# Patient Record
Sex: Female | Born: 1935 | Race: White | Hispanic: No | Smoking: Former smoker
Health system: Southern US, Community
[De-identification: ages and names within clinical notes are randomized; demographics above are authoritative.]

## PROBLEM LIST (undated history)

## (undated) DIAGNOSIS — C50919 Malignant neoplasm of unspecified site of unspecified female breast: Secondary | ICD-10-CM

## (undated) DIAGNOSIS — E7800 Pure hypercholesterolemia, unspecified: Secondary | ICD-10-CM

## (undated) DIAGNOSIS — E782 Mixed hyperlipidemia: Secondary | ICD-10-CM

## (undated) DIAGNOSIS — I429 Cardiomyopathy, unspecified: Secondary | ICD-10-CM

## (undated) DIAGNOSIS — F430 Acute stress reaction: Secondary | ICD-10-CM

## (undated) DIAGNOSIS — M1990 Unspecified osteoarthritis, unspecified site: Secondary | ICD-10-CM

## (undated) DIAGNOSIS — M8580 Other specified disorders of bone density and structure, unspecified site: Secondary | ICD-10-CM

## (undated) DIAGNOSIS — Z8719 Personal history of other diseases of the digestive system: Secondary | ICD-10-CM

## (undated) DIAGNOSIS — Z972 Presence of dental prosthetic device (complete) (partial): Secondary | ICD-10-CM

## (undated) DIAGNOSIS — I248 Other forms of acute ischemic heart disease: Secondary | ICD-10-CM

## (undated) DIAGNOSIS — E119 Type 2 diabetes mellitus without complications: Secondary | ICD-10-CM

## (undated) DIAGNOSIS — C55 Malignant neoplasm of uterus, part unspecified: Secondary | ICD-10-CM

## (undated) DIAGNOSIS — I2489 Other forms of acute ischemic heart disease: Secondary | ICD-10-CM

## (undated) DIAGNOSIS — Z8711 Personal history of peptic ulcer disease: Secondary | ICD-10-CM

## (undated) DIAGNOSIS — D699 Hemorrhagic condition, unspecified: Secondary | ICD-10-CM

## (undated) DIAGNOSIS — K219 Gastro-esophageal reflux disease without esophagitis: Secondary | ICD-10-CM

## (undated) DIAGNOSIS — K08109 Complete loss of teeth, unspecified cause, unspecified class: Secondary | ICD-10-CM

## (undated) DIAGNOSIS — C539 Malignant neoplasm of cervix uteri, unspecified: Secondary | ICD-10-CM

## (undated) DIAGNOSIS — I8390 Asymptomatic varicose veins of unspecified lower extremity: Secondary | ICD-10-CM

## (undated) DIAGNOSIS — I10 Essential (primary) hypertension: Secondary | ICD-10-CM

## (undated) HISTORY — DX: Unspecified osteoarthritis, unspecified site: M19.90

## (undated) HISTORY — DX: Mixed hyperlipidemia: E78.2

## (undated) HISTORY — DX: Gastro-esophageal reflux disease without esophagitis: K21.9

## (undated) HISTORY — DX: Cardiomyopathy, unspecified: I42.9

## (undated) HISTORY — DX: Pure hypercholesterolemia, unspecified: E78.00

## (undated) HISTORY — DX: Asymptomatic varicose veins of unspecified lower extremity: I83.90

## (undated) HISTORY — DX: Other forms of acute ischemic heart disease: I24.8

## (undated) HISTORY — DX: Personal history of peptic ulcer disease: Z87.11

## (undated) HISTORY — DX: Type 2 diabetes mellitus without complications: E11.9

## (undated) HISTORY — DX: Acute stress reaction: F43.0

## (undated) HISTORY — DX: Personal history of other diseases of the digestive system: Z87.19

## (undated) HISTORY — DX: Other specified disorders of bone density and structure, unspecified site: M85.80

## (undated) HISTORY — DX: Malignant neoplasm of uterus, part unspecified: C55

## (undated) HISTORY — DX: Other forms of acute ischemic heart disease: I24.89

## (undated) HISTORY — DX: Hemorrhagic condition, unspecified: D69.9

## (undated) HISTORY — DX: Essential (primary) hypertension: I10

---

## 1962-09-30 HISTORY — PX: ABDOMINAL HYSTERECTOMY: SHX81

## 1993-09-30 HISTORY — PX: BLADDER SURGERY: SHX569

## 2001-08-12 ENCOUNTER — Other Ambulatory Visit: Admission: RE | Admit: 2001-08-12 | Discharge: 2001-08-12 | Payer: Self-pay | Admitting: Family Medicine

## 2004-07-03 ENCOUNTER — Ambulatory Visit: Payer: Self-pay | Admitting: Dentistry

## 2004-07-03 ENCOUNTER — Encounter: Admission: AD | Admit: 2004-07-03 | Discharge: 2004-07-03 | Payer: Self-pay | Admitting: Dentistry

## 2004-07-20 ENCOUNTER — Ambulatory Visit: Payer: Self-pay | Admitting: Dentistry

## 2004-08-03 ENCOUNTER — Ambulatory Visit: Payer: Self-pay | Admitting: Dentistry

## 2004-08-28 ENCOUNTER — Ambulatory Visit: Payer: Self-pay | Admitting: Dentistry

## 2004-08-31 ENCOUNTER — Ambulatory Visit: Payer: Self-pay | Admitting: Internal Medicine

## 2004-09-05 ENCOUNTER — Ambulatory Visit: Payer: Self-pay | Admitting: Dentistry

## 2005-09-30 HISTORY — PX: OTHER SURGICAL HISTORY: SHX169

## 2006-02-12 ENCOUNTER — Ambulatory Visit: Payer: Self-pay | Admitting: Internal Medicine

## 2006-04-30 LAB — HM DEXA SCAN

## 2006-05-06 ENCOUNTER — Ambulatory Visit: Payer: Self-pay | Admitting: Family Medicine

## 2006-05-19 ENCOUNTER — Encounter: Payer: Self-pay | Admitting: Family Medicine

## 2006-05-19 ENCOUNTER — Ambulatory Visit: Payer: Self-pay | Admitting: Family Medicine

## 2006-05-19 ENCOUNTER — Other Ambulatory Visit: Admission: RE | Admit: 2006-05-19 | Discharge: 2006-05-19 | Payer: Self-pay | Admitting: Family Medicine

## 2006-05-19 LAB — CONVERTED CEMR LAB: Pap Smear: NORMAL

## 2006-06-05 ENCOUNTER — Ambulatory Visit: Payer: Self-pay | Admitting: Family Medicine

## 2006-06-12 ENCOUNTER — Ambulatory Visit: Payer: Self-pay | Admitting: Family Medicine

## 2006-08-07 ENCOUNTER — Ambulatory Visit: Payer: Self-pay | Admitting: Gastroenterology

## 2006-08-13 ENCOUNTER — Ambulatory Visit: Payer: Self-pay | Admitting: Family Medicine

## 2006-09-08 ENCOUNTER — Ambulatory Visit: Payer: Self-pay | Admitting: Family Medicine

## 2006-11-19 ENCOUNTER — Ambulatory Visit: Payer: Self-pay | Admitting: Family Medicine

## 2006-11-19 LAB — CONVERTED CEMR LAB
AST: 18 units/L (ref 0–37)
BUN: 13 mg/dL (ref 6–23)
Cholesterol: 192 mg/dL (ref 0–200)
Creatinine, Ser: 0.7 mg/dL (ref 0.4–1.2)
HDL: 54.8 mg/dL (ref >39.0)
Hgb A1c MFr Bld: 7 %
LDL Cholesterol: 112 mg/dL — ABNORMAL HIGH (ref 0–99)
Microalb Creat Ratio: 5.8 mg/g (ref 0.0–30.0)
Total CHOL/HDL Ratio: 3.5
Triglycerides: 124 mg/dL (ref 0–149)
VLDL: 25 mg/dL (ref 0–40)

## 2006-11-28 ENCOUNTER — Ambulatory Visit: Payer: Self-pay | Admitting: Family Medicine

## 2007-01-06 ENCOUNTER — Encounter: Payer: Self-pay | Admitting: Family Medicine

## 2007-01-06 DIAGNOSIS — K219 Gastro-esophageal reflux disease without esophagitis: Secondary | ICD-10-CM | POA: Insufficient documentation

## 2007-01-06 DIAGNOSIS — M8580 Other specified disorders of bone density and structure, unspecified site: Secondary | ICD-10-CM | POA: Insufficient documentation

## 2007-01-06 DIAGNOSIS — R32 Unspecified urinary incontinence: Secondary | ICD-10-CM | POA: Insufficient documentation

## 2007-01-06 DIAGNOSIS — I10 Essential (primary) hypertension: Secondary | ICD-10-CM | POA: Insufficient documentation

## 2007-01-06 DIAGNOSIS — M1990 Unspecified osteoarthritis, unspecified site: Secondary | ICD-10-CM | POA: Insufficient documentation

## 2007-01-06 DIAGNOSIS — E1165 Type 2 diabetes mellitus with hyperglycemia: Secondary | ICD-10-CM | POA: Insufficient documentation

## 2007-01-27 ENCOUNTER — Ambulatory Visit: Payer: Self-pay | Admitting: Family Medicine

## 2007-01-27 DIAGNOSIS — E1169 Type 2 diabetes mellitus with other specified complication: Secondary | ICD-10-CM | POA: Insufficient documentation

## 2007-01-27 DIAGNOSIS — E782 Mixed hyperlipidemia: Secondary | ICD-10-CM

## 2007-01-27 DIAGNOSIS — I8390 Asymptomatic varicose veins of unspecified lower extremity: Secondary | ICD-10-CM | POA: Insufficient documentation

## 2007-01-29 ENCOUNTER — Encounter: Payer: Self-pay | Admitting: Family Medicine

## 2007-01-29 HISTORY — PX: OTHER SURGICAL HISTORY: SHX169

## 2007-02-26 ENCOUNTER — Ambulatory Visit: Payer: Self-pay | Admitting: Family Medicine

## 2007-02-27 LAB — CONVERTED CEMR LAB
ALT: 15 units/L (ref 0–40)
AST: 15 units/L (ref 0–37)
Hgb A1c MFr Bld: 7.2 % — ABNORMAL HIGH (ref 4.6–6.0)
Total CHOL/HDL Ratio: 3.8

## 2007-03-02 ENCOUNTER — Encounter: Payer: Self-pay | Admitting: Family Medicine

## 2007-05-13 ENCOUNTER — Ambulatory Visit: Payer: Self-pay | Admitting: Family Medicine

## 2007-05-13 DIAGNOSIS — R5383 Other fatigue: Secondary | ICD-10-CM

## 2007-05-13 DIAGNOSIS — R5381 Other malaise: Secondary | ICD-10-CM | POA: Insufficient documentation

## 2007-05-13 DIAGNOSIS — F430 Acute stress reaction: Secondary | ICD-10-CM | POA: Insufficient documentation

## 2007-05-14 LAB — CONVERTED CEMR LAB
BUN: 13 mg/dL (ref 6–23)
Basophils Relative: 0.1 % (ref 0.0–1.0)
Cholesterol: 173 mg/dL (ref 0–200)
Creatinine, Ser: 0.7 mg/dL (ref 0.4–1.2)
HCT: 40.3 % (ref 36.0–46.0)
Hemoglobin: 13.8 g/dL (ref 12.0–15.0)
LDL Cholesterol: 104 mg/dL — ABNORMAL HIGH (ref 0–99)
Lymphocytes Relative: 20.6 % (ref 12.0–46.0)
MCHC: 34.1 g/dL (ref 30.0–36.0)
Monocytes Absolute: 0.5 10*3/uL (ref 0.2–0.7)
Neutrophils Relative %: 70.1 % (ref 43.0–77.0)
RDW: 12.3 % (ref 11.5–14.6)

## 2007-05-19 ENCOUNTER — Encounter: Payer: Self-pay | Admitting: Family Medicine

## 2007-05-25 ENCOUNTER — Ambulatory Visit: Payer: Self-pay | Admitting: Family Medicine

## 2007-05-26 LAB — CONVERTED CEMR LAB: Sed Rate: 8 mm/hr (ref 0–25)

## 2007-05-29 ENCOUNTER — Telehealth (INDEPENDENT_AMBULATORY_CARE_PROVIDER_SITE_OTHER): Payer: Self-pay | Admitting: *Deleted

## 2007-06-05 ENCOUNTER — Encounter: Payer: Self-pay | Admitting: Family Medicine

## 2007-06-08 ENCOUNTER — Ambulatory Visit: Payer: Self-pay | Admitting: Dentistry

## 2007-06-08 ENCOUNTER — Encounter: Payer: Self-pay | Admitting: Family Medicine

## 2007-06-08 ENCOUNTER — Ambulatory Visit: Payer: Self-pay | Admitting: Family Medicine

## 2007-06-10 ENCOUNTER — Encounter (INDEPENDENT_AMBULATORY_CARE_PROVIDER_SITE_OTHER): Payer: Self-pay | Admitting: *Deleted

## 2007-08-14 ENCOUNTER — Ambulatory Visit: Payer: Self-pay | Admitting: Family Medicine

## 2007-08-15 ENCOUNTER — Encounter: Payer: Self-pay | Admitting: Family Medicine

## 2007-08-17 LAB — CONVERTED CEMR LAB
ALT: 15 units/L (ref 0–35)
AST: 18 units/L (ref 0–37)
Albumin: 4.3 g/dL (ref 3.5–5.2)
Calcium: 10 mg/dL (ref 8.4–10.5)
Cholesterol: 171 mg/dL (ref 0–200)
HDL: 51 mg/dL (ref >39)
Hgb A1c MFr Bld: 6.9 % — ABNORMAL HIGH (ref 4.6–6.1)
Sodium: 140 meq/L (ref 135–145)
Total CHOL/HDL Ratio: 3.4
Triglycerides: 182 mg/dL — ABNORMAL HIGH (ref <150)

## 2007-09-04 ENCOUNTER — Ambulatory Visit: Payer: Self-pay | Admitting: Internal Medicine

## 2007-10-01 DIAGNOSIS — F430 Acute stress reaction: Secondary | ICD-10-CM

## 2007-10-01 HISTORY — DX: Acute stress reaction: F43.0

## 2007-10-04 ENCOUNTER — Other Ambulatory Visit: Payer: Self-pay

## 2007-10-04 ENCOUNTER — Encounter (INDEPENDENT_AMBULATORY_CARE_PROVIDER_SITE_OTHER): Payer: Self-pay | Admitting: Dermatology

## 2007-10-04 ENCOUNTER — Emergency Department: Payer: Self-pay | Admitting: Emergency Medicine

## 2007-10-04 ENCOUNTER — Encounter: Payer: Self-pay | Admitting: Family Medicine

## 2007-10-05 ENCOUNTER — Telehealth: Payer: Self-pay | Admitting: Family Medicine

## 2007-10-05 ENCOUNTER — Encounter: Payer: Self-pay | Admitting: Family Medicine

## 2007-10-09 ENCOUNTER — Ambulatory Visit: Payer: Self-pay | Admitting: Family Medicine

## 2007-10-09 DIAGNOSIS — Z9189 Other specified personal risk factors, not elsewhere classified: Secondary | ICD-10-CM | POA: Insufficient documentation

## 2007-10-16 ENCOUNTER — Ambulatory Visit: Payer: Self-pay | Admitting: Family Medicine

## 2007-10-19 ENCOUNTER — Telehealth (INDEPENDENT_AMBULATORY_CARE_PROVIDER_SITE_OTHER): Payer: Self-pay | Admitting: *Deleted

## 2007-11-02 ENCOUNTER — Ambulatory Visit: Payer: Self-pay | Admitting: Family Medicine

## 2007-11-03 LAB — CONVERTED CEMR LAB
AST: 15 units/L (ref 0–37)
Albumin: 3.8 g/dL (ref 3.5–5.2)
Calcium: 9.7 mg/dL (ref 8.4–10.5)
Chloride: 103 meq/L (ref 96–112)
GFR calc Af Amer: 107 mL/min
GFR calc non Af Amer: 88 mL/min
Sodium: 139 meq/L (ref 135–145)
Triglycerides: 101 mg/dL (ref 0–149)
VLDL: 20 mg/dL (ref 0–40)

## 2007-11-24 ENCOUNTER — Ambulatory Visit: Payer: Self-pay | Admitting: Family Medicine

## 2007-11-24 DIAGNOSIS — M545 Low back pain, unspecified: Secondary | ICD-10-CM | POA: Insufficient documentation

## 2008-01-19 ENCOUNTER — Ambulatory Visit: Payer: Self-pay | Admitting: Family Medicine

## 2008-01-20 LAB — CONVERTED CEMR LAB
ALT: 16 units/L (ref 0–35)
AST: 17 units/L (ref 0–37)
BUN: 12 mg/dL (ref 6–23)
Basophils Relative: 0.1 % (ref 0.0–1.0)
CO2: 29 meq/L (ref 19–32)
Chloride: 103 meq/L (ref 96–112)
Creatinine, Ser: 0.8 mg/dL (ref 0.4–1.2)
Eosinophils Absolute: 0.1 10*3/uL (ref 0.0–0.7)
Eosinophils Relative: 1.9 % (ref 0.0–5.0)
Glucose, Bld: 179 mg/dL — ABNORMAL HIGH (ref 70–99)
Lymphocytes Relative: 17.1 % (ref 12.0–46.0)
MCV: 89.5 fL (ref 78.0–100.0)
Microalb Creat Ratio: 63.8 mg/g — ABNORMAL HIGH (ref 0.0–30.0)
Microalb, Ur: 4 mg/dL — ABNORMAL HIGH (ref 0.0–1.9)
Monocytes Relative: 8.4 % (ref 3.0–12.0)
Neutrophils Relative %: 72.5 % (ref 43.0–77.0)
RBC: 4.8 M/uL (ref 3.87–5.11)
VLDL: 17 mg/dL (ref 0–40)
WBC: 5.9 10*3/uL (ref 4.5–10.5)

## 2008-03-01 ENCOUNTER — Ambulatory Visit: Payer: Self-pay | Admitting: Family Medicine

## 2008-03-22 ENCOUNTER — Telehealth: Payer: Self-pay | Admitting: Family Medicine

## 2008-03-24 ENCOUNTER — Encounter: Payer: Self-pay | Admitting: Family Medicine

## 2008-04-04 ENCOUNTER — Encounter: Payer: Self-pay | Admitting: Family Medicine

## 2008-04-18 ENCOUNTER — Ambulatory Visit: Payer: Self-pay | Admitting: Family Medicine

## 2008-04-18 DIAGNOSIS — J309 Allergic rhinitis, unspecified: Secondary | ICD-10-CM | POA: Insufficient documentation

## 2008-05-02 ENCOUNTER — Ambulatory Visit: Payer: Self-pay | Admitting: Family Medicine

## 2008-05-03 LAB — CONVERTED CEMR LAB
ALT: 14 units/L (ref 0–35)
AST: 16 units/L (ref 0–37)
Albumin: 3.7 g/dL (ref 3.5–5.2)
BUN: 7 mg/dL (ref 6–23)
CO2: 28 meq/L (ref 19–32)
Cholesterol: 152 mg/dL (ref 0–200)
Creatinine, Ser: 0.8 mg/dL (ref 0.4–1.2)
Glucose, Bld: 160 mg/dL — ABNORMAL HIGH (ref 70–99)
HDL: 49.5 mg/dL (ref >39.0)
Hgb A1c MFr Bld: 6.8 % — ABNORMAL HIGH (ref 4.6–6.0)
Microalb Creat Ratio: 2.9 mg/g (ref 0.0–30.0)
Microalb, Ur: 0.2 mg/dL (ref 0.0–1.9)
Total CHOL/HDL Ratio: 3.1
Triglycerides: 94 mg/dL (ref 0–149)

## 2008-06-08 ENCOUNTER — Ambulatory Visit: Payer: Self-pay | Admitting: Family Medicine

## 2008-06-20 ENCOUNTER — Ambulatory Visit: Payer: Self-pay | Admitting: Family Medicine

## 2008-06-20 ENCOUNTER — Encounter: Payer: Self-pay | Admitting: Family Medicine

## 2008-06-20 LAB — HM MAMMOGRAPHY: HM Mammogram: NORMAL

## 2008-06-29 ENCOUNTER — Ambulatory Visit: Payer: Self-pay | Admitting: Family Medicine

## 2008-09-07 ENCOUNTER — Ambulatory Visit: Payer: Self-pay | Admitting: Family Medicine

## 2008-09-07 DIAGNOSIS — M19049 Primary osteoarthritis, unspecified hand: Secondary | ICD-10-CM | POA: Insufficient documentation

## 2008-12-05 ENCOUNTER — Ambulatory Visit: Payer: Self-pay | Admitting: Family Medicine

## 2008-12-06 LAB — CONVERTED CEMR LAB
AST: 15 units/L (ref 0–37)
BUN: 11 mg/dL (ref 6–23)
Chloride: 106 meq/L (ref 96–112)
Cholesterol: 157 mg/dL (ref 0–200)
GFR calc non Af Amer: 88 mL/min
Glucose, Bld: 158 mg/dL — ABNORMAL HIGH (ref 70–99)
Hgb A1c MFr Bld: 6.9 % — ABNORMAL HIGH (ref 4.6–6.0)
Potassium: 4.1 meq/L (ref 3.5–5.1)
Sodium: 142 meq/L (ref 135–145)

## 2008-12-08 ENCOUNTER — Ambulatory Visit: Payer: Self-pay | Admitting: Family Medicine

## 2008-12-08 LAB — HM DIABETES FOOT EXAM

## 2009-01-16 ENCOUNTER — Encounter: Payer: Self-pay | Admitting: Family Medicine

## 2010-12-14 ENCOUNTER — Telehealth: Payer: Self-pay | Admitting: Family Medicine

## 2010-12-18 NOTE — Progress Notes (Signed)
  Phone Note Call from Patient Call back at Home Phone [phone]   Caller: Daughter Call For: Laura Part MD Summary of Call: Patient moved out of state a couple of years ago, but has now moved back and is asking if you would be wiling to take her back as a patient. Please advise.  Initial call taken by: Melody Comas,  December 14, 2010 9:39 AM  Follow-up for Phone Call        that is ok  Follow-up by: Laura Part MD,  December 14, 2010 1:13 PM

## 2010-12-26 ENCOUNTER — Encounter: Payer: Self-pay | Admitting: Family Medicine

## 2010-12-26 ENCOUNTER — Ambulatory Visit (INDEPENDENT_AMBULATORY_CARE_PROVIDER_SITE_OTHER): Payer: Medicare Other | Admitting: Family Medicine

## 2010-12-26 VITALS — BP 152/90 | HR 80 | Temp 98.4°F | Ht 63.0 in | Wt 157.1 lb

## 2010-12-26 DIAGNOSIS — J019 Acute sinusitis, unspecified: Secondary | ICD-10-CM

## 2010-12-26 MED ORDER — FLUTICASONE PROPIONATE 50 MCG/ACT NA SUSP: 2.0000 | Freq: Every day | NASAL | Status: DC

## 2010-12-26 MED ORDER — AMOXICILLIN 875 MG PO TABS: 875.0000 mg | ORAL_TABLET | Freq: Two times a day (BID) | ORAL | Status: AC

## 2010-12-26 NOTE — Assessment & Plan Note (Signed)
Start amoxil and use flonase.  She can get delsym for cough.  Fu prn.  Pt to fu with Dr. Milinda Antis re:DM2.

## 2010-12-26 NOTE — Progress Notes (Signed)
duration of symptoms: Since Saturday rhinorrhea:yes congestion:yes ear pain:no but both ears do itch sore throat: mild, initially, less now per patient Cough: worse at night, no sputum.  Myalgias: no  Fevers: none known other concerns:sinus pressure Sugar was prev controlled until recent illness.    ROS: See HPI.  Otherwise negative.    Meds, vitals, and allergies reviewed.   GEN: nad, alert and oriented HEENT: mucous membranes moist, TM w/o erythema, nasal epithelium injected, OP with cobblestoning, frontal sinuses ttp  NECK: supple w/o LA CV: rrr. PULM: ctab, no inc wob EXT: no edema

## 2010-12-26 NOTE — Patient Instructions (Signed)
Schedule a follow up lab appointment in:  3months. Schedule a follow up  appointment in a few days after that with Dr. Milinda Antis.  visit.  Use the flonase and start the amoxil.  You can use delsym for cough.  Your should gradually improve.  Let us know if you have other concerns.  Take care.

## 2010-12-27 ENCOUNTER — Telehealth: Payer: Self-pay | Admitting: Family Medicine

## 2010-12-27 DIAGNOSIS — E119 Type 2 diabetes mellitus without complications: Secondary | ICD-10-CM

## 2010-12-27 DIAGNOSIS — I10 Essential (primary) hypertension: Secondary | ICD-10-CM

## 2010-12-27 NOTE — Telephone Encounter (Signed)
Message copied by Roxy Manns on Thu Dec 27, 2010  8:29 AM ------      Message from: Raechel Ache      Created: Wed Dec 26, 2010  4:41 PM       Pt is back in town.  I asked her to fu with you re:DM2.  Please order the A1c for 3 months from now so it'll come back to you.  She recently had a CPE in [place] and will bring in her papers.

## 2011-01-07 ENCOUNTER — Other Ambulatory Visit: Payer: Self-pay | Admitting: *Deleted

## 2011-01-07 MED ORDER — METFORMIN HCL 500 MG PO TABS: ORAL_TABLET | ORAL | Status: DC

## 2011-01-07 MED ORDER — GLUCOSE BLOOD VI STRP: ORAL_STRIP | Status: DC

## 2011-02-04 ENCOUNTER — Other Ambulatory Visit: Payer: Self-pay | Admitting: Family Medicine

## 2011-02-05 NOTE — Telephone Encounter (Signed)
Rx called in as directed.   

## 2011-02-05 NOTE — Telephone Encounter (Signed)
Px written for call in   

## 2011-03-19 ENCOUNTER — Other Ambulatory Visit (INDEPENDENT_AMBULATORY_CARE_PROVIDER_SITE_OTHER): Payer: Medicare Other | Admitting: Family Medicine

## 2011-03-19 DIAGNOSIS — I10 Essential (primary) hypertension: Secondary | ICD-10-CM

## 2011-03-19 DIAGNOSIS — E119 Type 2 diabetes mellitus without complications: Secondary | ICD-10-CM

## 2011-03-19 LAB — RENAL FUNCTION PANEL
Albumin: 4 g/dL (ref 3.5–5.2)
BUN: 13 mg/dL (ref 6–23)
CO2: 30 mEq/L (ref 19–32)
Calcium: 9.8 mg/dL (ref 8.4–10.5)
Chloride: 106 mEq/L (ref 96–112)
Phosphorus: 4 mg/dL (ref 2.3–4.6)

## 2011-03-21 ENCOUNTER — Encounter: Payer: Self-pay | Admitting: Family Medicine

## 2011-03-22 ENCOUNTER — Ambulatory Visit: Payer: Medicare Other | Admitting: Family Medicine

## 2011-04-05 ENCOUNTER — Encounter: Payer: Self-pay | Admitting: Family Medicine

## 2011-04-05 ENCOUNTER — Ambulatory Visit (INDEPENDENT_AMBULATORY_CARE_PROVIDER_SITE_OTHER): Payer: Medicare Other | Admitting: Family Medicine

## 2011-04-05 DIAGNOSIS — E119 Type 2 diabetes mellitus without complications: Secondary | ICD-10-CM

## 2011-04-05 DIAGNOSIS — I10 Essential (primary) hypertension: Secondary | ICD-10-CM

## 2011-04-05 DIAGNOSIS — E782 Mixed hyperlipidemia: Secondary | ICD-10-CM

## 2011-04-05 DIAGNOSIS — M899 Disorder of bone, unspecified: Secondary | ICD-10-CM

## 2011-04-05 DIAGNOSIS — K219 Gastro-esophageal reflux disease without esophagitis: Secondary | ICD-10-CM

## 2011-04-05 MED ORDER — METFORMIN HCL 500 MG PO TABS: ORAL_TABLET | ORAL | Status: DC

## 2011-04-05 MED ORDER — OMEPRAZOLE 20 MG PO CPDR: 20.0000 mg | DELAYED_RELEASE_CAPSULE | Freq: Every day | ORAL | Status: DC

## 2011-04-05 MED ORDER — LISINOPRIL 40 MG PO TABS: 40.0000 mg | ORAL_TABLET | Freq: Every day | ORAL | Status: DC

## 2011-04-05 MED ORDER — GLYBURIDE 5 MG PO TABS: 10.0000 mg | ORAL_TABLET | Freq: Two times a day (BID) | ORAL | Status: DC

## 2011-04-05 MED ORDER — SIMVASTATIN 20 MG PO TABS: 20.0000 mg | ORAL_TABLET | Freq: Every day | ORAL | Status: DC

## 2011-04-05 NOTE — Assessment & Plan Note (Signed)
Due for check on zocor (low dose) and diet  Will check at 3 mo lab before next visit Disc low sat fat diet in detail and all meds refilled

## 2011-04-05 NOTE — Assessment & Plan Note (Signed)
This is worse with generally worse diet and exercise Plans to get back to better habits now Hesitant to inc metformin due to diarrhea  Will inc glipizide to 10 bid and watch sugars twice daily a1c and f/u in 3 mo  Will also work on wt loss and get eye exam up to date

## 2011-04-05 NOTE — Assessment & Plan Note (Signed)
Refilled omeprazole which works well for her

## 2011-04-05 NOTE — Patient Instructions (Signed)
You need a Tdap vaccine (that has tetnus and whooping cough vaccine in it)-- get that at the health dept -- it would be cheaper  Get back to a diabetic diet - really work on that  Exercise 5 days per week for 30 minutes (in or outdoors)  Check sugar twice daily am and pm  Increase you glipizide to 2 pills in am and 2 pills in pm  If any problems or low sugars let me know  Schedule fasting labs and then follow up in 3 months
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
## 2011-04-05 NOTE — Assessment & Plan Note (Signed)
Per pt intol of boniva and fosamax  On ca and D- disc imp of that  Will rev dexa testing at next f/u

## 2011-04-05 NOTE — Assessment & Plan Note (Signed)
bp is slt high - but pt is nervous today- returning from a long absence Will re check at next visit after better health habits and if still elevated consider med management Disc low sodium diet and exercise/ wt loss

## 2011-05-25 ENCOUNTER — Encounter: Payer: Self-pay | Admitting: Family Medicine

## 2011-05-25 ENCOUNTER — Ambulatory Visit (INDEPENDENT_AMBULATORY_CARE_PROVIDER_SITE_OTHER): Payer: Medicare Other | Admitting: Family Medicine

## 2011-05-25 VITALS — BP 142/80 | Temp 98.1°F | Wt 154.0 lb

## 2011-05-25 DIAGNOSIS — R10A0 Flank pain, unspecified side: Secondary | ICD-10-CM

## 2011-05-25 DIAGNOSIS — R350 Frequency of micturition: Secondary | ICD-10-CM

## 2011-05-25 DIAGNOSIS — R109 Unspecified abdominal pain: Secondary | ICD-10-CM

## 2011-05-25 LAB — POCT URINALYSIS DIPSTICK
Bilirubin, UA: NEGATIVE
Blood, UA: NEGATIVE
Nitrite, UA: NEGATIVE
Spec Grav, UA: 1.01
pH, UA: 5

## 2011-05-25 MED ORDER — CIPROFLOXACIN HCL 250 MG PO TABS: 250.0000 mg | ORAL_TABLET | Freq: Two times a day (BID) | ORAL | Status: AC

## 2011-05-25 NOTE — Progress Notes (Signed)
  Subjective:    Patient ID: Laura Bell, female    DOB: May 30, 1936, 75 y.o.   MRN: [id number]  HPI  Laura Bell, a 75 y.o. female presents today in the office for the following:    Hurting across back. Has had some urgeny. Sometimes a lot, sometimes only a little. Had a little URI symptoms, but that is better ow. Had some abd pain last now. Has diarrhea off and on. Also pain down side of hip.   The patient denies any dysuria, but she does have urgency. She also describes some right-sided flank pain. No gross hematuria. The patient is eating and drinking normally. She does have some mild degree of abdominal pain in the suprapubic region, and also some mild abdominal pain in the RIGHT upper quadrant. She has been afebrile throughout.  She does not have a history of repetitive urinary tract infections. Primary complaints are the urgency, as well as flank pain.  The PMH, PSH, Social History, Family History, Medications, and allergies have been reviewed in [facility], and have been updated if relevant.  Review of Systems ROS: GEN: Acute illness details above GI: Tolerating PO intake GU: maintaining adequate hydration and urination Pulm: No SOB Interactive and getting along well at home.  Otherwise, ROS is as per the HPI.     Objective:   Physical Exam   Physical Exam  Blood pressure 142/80, temperature 98.1 F (36.7 C), temperature source Oral, weight 154 lb 0.6 oz (69.872 kg).  GEN: WDWN, NAD, Non-toxic, A & O x 3 HEENT: Atraumatic, Normocephalic. Neck supple. No masses, No LAD. Ears and Nose: No external deformity. CV: RRR, No M/G/R. No JVD. No thrill. No extra heart sounds. PULM: CTA B, no wheezes, crackles, rhonchi. No retractions. No resp. distress. No accessory muscle use. ABD: S, nondistended. Mild suprapubic tenderness. Mild RIGHT upper quadrant tenderness. Negative Murphy sign. Positive CVA tenderness on the RIGHT. No rebound tenderness. No hepatosplenomegaly.Positive  normal bowel sounds. EXTR: No c/c/e NEURO Normal gait.  PSYCH: Normally interactive. Conversant. Not depressed or anxious appearing.  Calm demeanor.        Assessment & Plan:   1. Frequent urination  POCT Urine Dip, Urine culture, ciprofloxacin (CIPRO) 250 MG tablet  2. Flank pain  Urine culture, ciprofloxacin (CIPRO) 250 MG tablet   History and exam seem most consistent with an early pyelonephritis. The patient's urinalysis was relatively clean. We're going to obtain a culture, and presumptively treat her.  By examination, she does not have an acute abdomen. We discussed that the etiology is not 100% clear, but I feel comfortable managing this clinically and she has good followup in case she does not do well over the next few days.   I have asked her to follow up with Dr. Milinda Antis on Tuesday or Wednesday if she is not improving, or I would be happy to see the patient myself if she has no availability.

## 2011-05-29 ENCOUNTER — Ambulatory Visit (INDEPENDENT_AMBULATORY_CARE_PROVIDER_SITE_OTHER): Payer: Medicare Other | Admitting: Family Medicine

## 2011-05-29 ENCOUNTER — Encounter: Payer: Self-pay | Admitting: Family Medicine

## 2011-05-29 VITALS — BP 132/80 | HR 118 | Temp 97.8°F | Ht 63.0 in | Wt 154.2 lb

## 2011-05-29 DIAGNOSIS — M549 Dorsalgia, unspecified: Secondary | ICD-10-CM

## 2011-05-29 DIAGNOSIS — R1011 Right upper quadrant pain: Secondary | ICD-10-CM

## 2011-05-29 LAB — POCT URINALYSIS DIPSTICK
Ketones, UA: NEGATIVE
Leukocytes, UA: NEGATIVE
Nitrite, UA: NEGATIVE
Protein, UA: NEGATIVE
Urobilinogen, UA: 0.2
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
## 2011-05-29 NOTE — Patient Instructions (Signed)
We will refer you for abdominal ultrasound at check out  I wonder if you may have gallstones  If symptoms worsen or you have fever or vomiting - call or go to ER Avoid fatty foods

## 2011-05-30 NOTE — Assessment & Plan Note (Addendum)
See overview -- sent for abd [place] ua is negative today- will finish course of abx for uti

## 2011-05-31 ENCOUNTER — Encounter: Payer: Self-pay | Admitting: Family Medicine

## 2011-05-31 ENCOUNTER — Telehealth: Payer: Self-pay | Admitting: *Deleted

## 2011-05-31 ENCOUNTER — Ambulatory Visit: Payer: Self-pay | Admitting: Family Medicine

## 2011-05-31 ENCOUNTER — Ambulatory Visit (INDEPENDENT_AMBULATORY_CARE_PROVIDER_SITE_OTHER): Payer: Medicare Other | Admitting: Family Medicine

## 2011-05-31 DIAGNOSIS — E782 Mixed hyperlipidemia: Secondary | ICD-10-CM

## 2011-05-31 DIAGNOSIS — M899 Disorder of bone, unspecified: Secondary | ICD-10-CM

## 2011-05-31 DIAGNOSIS — R1011 Right upper quadrant pain: Secondary | ICD-10-CM

## 2011-05-31 DIAGNOSIS — M949 Disorder of cartilage, unspecified: Secondary | ICD-10-CM

## 2011-05-31 DIAGNOSIS — I10 Essential (primary) hypertension: Secondary | ICD-10-CM

## 2011-05-31 DIAGNOSIS — E119 Type 2 diabetes mellitus without complications: Secondary | ICD-10-CM

## 2011-05-31 LAB — COMPREHENSIVE METABOLIC PANEL
ALT: 18 U/L (ref 0–35)
Albumin: 4 g/dL (ref 3.5–5.2)
Alkaline Phosphatase: 53 U/L (ref 39–117)
CO2: 28 mEq/L (ref 19–32)
GFR: 110.47 mL/min (ref >60.00)
Glucose, Bld: 221 mg/dL — ABNORMAL HIGH (ref 70–99)
Potassium: 4.3 mEq/L (ref 3.5–5.1)
Sodium: 138 mEq/L (ref 135–145)
Total Protein: 6.6 g/dL (ref 6.0–8.3)

## 2011-05-31 LAB — CBC WITH DIFFERENTIAL/PLATELET
Basophils Absolute: 0 10*3/uL (ref 0.0–0.1)
Eosinophils Relative: 1.5 % (ref 0.0–5.0)
Monocytes Relative: 8 % (ref 3.0–12.0)
Neutrophils Relative %: 70.2 % (ref 43.0–77.0)
Platelets: 221 10*3/uL (ref 150.0–400.0)
RDW: 13.4 % (ref 11.5–14.6)
WBC: 5.6 10*3/uL (ref 4.5–10.5)

## 2011-05-31 LAB — LIPASE: Lipase: 29 U/L (ref 11.0–59.0)

## 2011-05-31 LAB — LIPID PANEL
Cholesterol: 168 mg/dL (ref 0–200)
LDL Cholesterol: 96 mg/dL (ref 0–99)
VLDL: 18 mg/dL (ref 0.0–40.0)

## 2011-05-31 LAB — HEMOGLOBIN A1C: Hgb A1c MFr Bld: 7.7 % — ABNORMAL HIGH (ref 4.6–6.5)

## 2011-05-31 NOTE — Assessment & Plan Note (Addendum)
slt improved  Waiting on [place] Lab today also  ucx is neg- rev this with her-inst to stop abx

## 2011-05-31 NOTE — Telephone Encounter (Signed)
Patient called regarding the results of her ultrasound.  Advised patient that we do have the results back but I really couldn't give her much detail because Dr. Milinda Antis has not reviewed the results.  I did tell her that it appeared that everything was ok.  Please advise.

## 2011-05-31 NOTE — Patient Instructions (Signed)
Labs today  Go ahead and cancel October appointments at check out  We will do eye doctor referral at check out  Follow up in 3 months I will alert your when ultrasound report comes to me

## 2011-05-31 NOTE — Assessment & Plan Note (Signed)
Improved with bp of 130/80s at home  Small white coat component Rev need for exercise

## 2011-05-31 NOTE — Assessment & Plan Note (Signed)
Per pt much improved with glyburide Rev sugars Ref to opthy  Lab today- expect a1c to start imp  Rev need for exercise when feeling better
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
## 2011-05-31 NOTE — Telephone Encounter (Signed)
Abdominal ultrasound does not show gallstones -- good news but still don't know what is causing her pain  I would consider ref to GI for further eval  Let me know if agreeable  (after I also rev her labs)

## 2011-05-31 NOTE — Telephone Encounter (Signed)
Patient notified as instructed by telephone. Pt is agreeable to go for GI referral and will wait to hear from pt care coordinator about appt.

## 2011-06-01 LAB — VITAMIN D 25 HYDROXY (VIT D DEFICIENCY, FRACTURES): Vit D, 25-Hydroxy: 32 ng/mL (ref 30–89)

## 2011-06-02 NOTE — Assessment & Plan Note (Signed)
No meds Check vit D today

## 2011-06-02 NOTE — Telephone Encounter (Signed)
Will do ref to GI

## 2011-06-05 ENCOUNTER — Other Ambulatory Visit: Payer: Self-pay | Admitting: Family Medicine

## 2011-06-12 ENCOUNTER — Telehealth: Payer: Self-pay | Admitting: Family Medicine

## 2011-06-12 NOTE — Telephone Encounter (Signed)
Message copied by Patience Musca on Wed Jun 12, 2011  5:35 PM ------      Message from: Roxy Manns A      Created: Thu Jun 06, 2011  8:31 AM       Please start 2000 iu vit D 3 otc daily

## 2011-06-12 NOTE — Telephone Encounter (Signed)
Returned your call.    Please call back .

## 2011-06-12 NOTE — Telephone Encounter (Signed)
Patient notified as instructed by telephone. 

## 2011-07-03 ENCOUNTER — Other Ambulatory Visit: Payer: Medicare Other

## 2011-07-08 ENCOUNTER — Ambulatory Visit: Payer: Medicare Other | Admitting: Family Medicine

## 2011-09-10 ENCOUNTER — Other Ambulatory Visit: Payer: Medicare Other

## 2011-09-10 ENCOUNTER — Other Ambulatory Visit (INDEPENDENT_AMBULATORY_CARE_PROVIDER_SITE_OTHER): Payer: Medicare Other

## 2011-09-10 DIAGNOSIS — E119 Type 2 diabetes mellitus without complications: Secondary | ICD-10-CM

## 2011-09-17 ENCOUNTER — Ambulatory Visit (INDEPENDENT_AMBULATORY_CARE_PROVIDER_SITE_OTHER): Payer: Medicare Other | Admitting: Family Medicine

## 2011-09-17 ENCOUNTER — Encounter: Payer: Self-pay | Admitting: Family Medicine

## 2011-09-17 VITALS — BP 138/80 | HR 96 | Temp 98.2°F | Ht 63.0 in | Wt 153.5 lb

## 2011-09-17 DIAGNOSIS — J40 Bronchitis, not specified as acute or chronic: Secondary | ICD-10-CM | POA: Insufficient documentation

## 2011-09-17 MED ORDER — AZITHROMYCIN 250 MG PO TABS: ORAL_TABLET | ORAL | Status: AC

## 2011-09-17 NOTE — Patient Instructions (Signed)
Take the zpak as directed for bronchitis Rest/ fluids/ saline nasal spray is good for congestion Continue flonase -- every other day if your nose bleeds Delsym for cough Update me if you get worse or if fever returns  Follow up with me about a month -- after cataract surgery

## 2011-09-17 NOTE — Assessment & Plan Note (Signed)
Following viral uri - with worsening prod cough in pt with DM  Will cover with zpak Also continue delsym Disc symptomatic care - see instructions on AVS  Adv to update if worse or sob
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
## 2011-10-17 DIAGNOSIS — H2510 Age-related nuclear cataract, unspecified eye: Secondary | ICD-10-CM | POA: Diagnosis not present

## 2011-10-28 ENCOUNTER — Other Ambulatory Visit: Payer: Self-pay | Admitting: Family Medicine

## 2011-10-28 ENCOUNTER — Ambulatory Visit: Payer: Medicare Other | Admitting: Family Medicine

## 2011-10-29 ENCOUNTER — Ambulatory Visit (INDEPENDENT_AMBULATORY_CARE_PROVIDER_SITE_OTHER): Payer: Medicare Other | Admitting: Family Medicine

## 2011-10-29 ENCOUNTER — Encounter: Payer: Self-pay | Admitting: Family Medicine

## 2011-10-29 VITALS — BP 160/84 | HR 88 | Temp 97.4°F | Ht 63.0 in | Wt 161.5 lb

## 2011-10-29 DIAGNOSIS — I10 Essential (primary) hypertension: Secondary | ICD-10-CM

## 2011-10-29 DIAGNOSIS — E663 Overweight: Secondary | ICD-10-CM

## 2011-10-29 DIAGNOSIS — E119 Type 2 diabetes mellitus without complications: Secondary | ICD-10-CM

## 2011-10-29 MED ORDER — METFORMIN HCL 1000 MG PO TABS: 1000.0000 mg | ORAL_TABLET | Freq: Two times a day (BID) | ORAL | Status: DC

## 2011-10-29 NOTE — Assessment & Plan Note (Signed)
Sugar up again with poor diet/ emotional eating (disc breaking this habit) Will inc metformin to 1000 bid if tolerated  Given info on DM meal planning On ace  Good foot exam  utd opthy Lab and f/u 94mo

## 2011-10-29 NOTE — Patient Instructions (Addendum)
Increase your metformin to 1000 mg twice a day (2 of the 500s or 1 of the 1000 twice daily) Follow diabetic diet  Check sugar am and pm please - keep a log  Check blood pressure once weekly  Lab and follow up in 3 months please
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
## 2011-10-29 NOTE — Assessment & Plan Note (Signed)
whitecoat syndrome with continued bp at home 130s/80s Will continue to watch cmet next visit  Will start exercising more  Disc goal of wt loss also

## 2011-10-31 ENCOUNTER — Telehealth: Payer: Self-pay | Admitting: Family Medicine

## 2011-10-31 DIAGNOSIS — E663 Overweight: Secondary | ICD-10-CM | POA: Insufficient documentation

## 2011-10-31 NOTE — Telephone Encounter (Signed)
Pt says De. Bedsole, increased the Metformin 1000mg , she wants to know if she should continue the Glyburide also. Told pt I would have someone call her back on tomorrow.

## 2011-10-31 NOTE — Assessment & Plan Note (Signed)
Disc imp of stopping emotional eating - which is sabatoging her health in many ways Rev wt goals Rev diet with healthy choices/ smaller portions and exercise 5 d per week for wt loss

## 2011-11-01 NOTE — Telephone Encounter (Signed)
Dr Milinda Antis saw pt, not me.  Will send to her.

## 2011-11-01 NOTE — Telephone Encounter (Signed)
Yes-continue the glyburide as well

## 2011-11-01 NOTE — Telephone Encounter (Signed)
Patient advised.

## 2011-12-03 ENCOUNTER — Ambulatory Visit (INDEPENDENT_AMBULATORY_CARE_PROVIDER_SITE_OTHER)
Admission: RE | Admit: 2011-12-03 | Discharge: 2011-12-03 | Disposition: A | Discharge status: Home / Self Care | Payer: Medicare Other | Source: Ambulatory Visit | Attending: Family Medicine | Admitting: Family Medicine

## 2011-12-03 ENCOUNTER — Ambulatory Visit (INDEPENDENT_AMBULATORY_CARE_PROVIDER_SITE_OTHER): Payer: Medicare Other | Admitting: Family Medicine

## 2011-12-03 ENCOUNTER — Encounter: Payer: Self-pay | Admitting: Family Medicine

## 2011-12-03 VITALS — BP 136/80 | HR 93 | Temp 97.4°F | Wt 157.5 lb

## 2011-12-03 DIAGNOSIS — M542 Cervicalgia: Secondary | ICD-10-CM | POA: Diagnosis not present

## 2011-12-03 DIAGNOSIS — M79609 Pain in unspecified limb: Secondary | ICD-10-CM | POA: Diagnosis not present

## 2011-12-03 DIAGNOSIS — M5030 Other cervical disc degeneration, unspecified cervical region: Secondary | ICD-10-CM | POA: Diagnosis not present
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
## 2011-12-03 NOTE — Assessment & Plan Note (Signed)
Per pt ? Recurrent - this episode with L arm pain and mild weakness of grip (that is improved now )  Spasm noted L peracervial muscles on exam  Nl neuro exam  Pain to rotate neck  CS x ray today and update Will continue heat/ice/ tylenol which have helped

## 2011-12-03 NOTE — Patient Instructions (Signed)
Xray of cervical spine today - we will update you  Continue ice and heat on neck and gentle stretching  Use a foam pillow that contours to your neck  Continue tylenol Update me if arm symptoms worsen  We will make plan when x ray report returns

## 2011-12-31 ENCOUNTER — Other Ambulatory Visit: Payer: Self-pay

## 2011-12-31 NOTE — Telephone Encounter (Signed)
pts daughter called and BS test strips would not go thru and sent back refill too soon. Spoke with Maralyn Sago at [facility] and she tried to refill again and was able to override. Maralyn Sago said some insurance and this was Medicare part B will only approve #100 in 90 day period no matter what directions from physician say. Maralyn Sago said diabetic strips will be in to morrow and hopefully next refill will go smoother. Pt notified diabetic strips will be read in AM.

## 2012-01-24 ENCOUNTER — Ambulatory Visit (INDEPENDENT_AMBULATORY_CARE_PROVIDER_SITE_OTHER): Payer: Medicare Other | Admitting: Family Medicine

## 2012-01-24 ENCOUNTER — Encounter: Payer: Self-pay | Admitting: Family Medicine

## 2012-01-24 VITALS — BP 130/80 | HR 98 | Temp 97.9°F | Ht 63.0 in | Wt 154.8 lb

## 2012-01-24 DIAGNOSIS — I10 Essential (primary) hypertension: Secondary | ICD-10-CM | POA: Diagnosis not present

## 2012-01-24 DIAGNOSIS — IMO0001 Reserved for inherently not codable concepts without codable children: Secondary | ICD-10-CM | POA: Diagnosis not present

## 2012-01-24 DIAGNOSIS — E119 Type 2 diabetes mellitus without complications: Secondary | ICD-10-CM

## 2012-01-24 DIAGNOSIS — E782 Mixed hyperlipidemia: Secondary | ICD-10-CM

## 2012-01-24 LAB — COMPREHENSIVE METABOLIC PANEL
ALT: 19 U/L (ref 0–35)
CO2: 27 mEq/L (ref 19–32)
Chloride: 103 mEq/L (ref 96–112)
GFR: 88.45 mL/min (ref >60.00)
Sodium: 138 mEq/L (ref 135–145)
Total Bilirubin: 0.2 mg/dL — ABNORMAL LOW (ref 0.3–1.2)
Total Protein: 6.8 g/dL (ref 6.0–8.3)

## 2012-01-24 LAB — HEMOGLOBIN A1C: Hgb A1c MFr Bld: 7.9 % — ABNORMAL HIGH (ref 4.6–6.5)

## 2012-01-24 LAB — LIPID PANEL
LDL Cholesterol: 82 mg/dL (ref 0–99)
VLDL: 27 mg/dL (ref 0.0–40.0)

## 2012-01-24 MED ORDER — GLYBURIDE 5 MG PO TABS: 10.0000 mg | ORAL_TABLET | Freq: Two times a day (BID) | ORAL | Status: DC

## 2012-01-24 MED ORDER — LISINOPRIL 40 MG PO TABS: 40.0000 mg | ORAL_TABLET | Freq: Every day | ORAL | Status: DC

## 2012-01-24 MED ORDER — SIMVASTATIN 20 MG PO TABS: 20.0000 mg | ORAL_TABLET | Freq: Every day | ORAL | Status: DC

## 2012-01-24 MED ORDER — OMEPRAZOLE 20 MG PO CPDR: 20.0000 mg | DELAYED_RELEASE_CAPSULE | Freq: Every day | ORAL | Status: DC

## 2012-01-24 NOTE — Assessment & Plan Note (Signed)
bp in fair control at this time  No changes needed  Disc lifstyle change with low sodium diet and exercise   Refilled meds for 90 d supply with refils

## 2012-01-24 NOTE — Assessment & Plan Note (Signed)
Per home sugars- improved with inc in metformin  Also increasing exercise a1c today and adv F/u 6 mo

## 2012-01-24 NOTE — Patient Instructions (Signed)
Labs today  No change in medicines  Keep walking  Follow up in 6 months for annual exam with labs prior
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
## 2012-01-24 NOTE — Assessment & Plan Note (Signed)
Check lipids today  Overall low sat fat diet  Disc goals

## 2012-01-27 ENCOUNTER — Encounter: Payer: Self-pay | Admitting: *Deleted

## 2012-01-27 DIAGNOSIS — L821 Other seborrheic keratosis: Secondary | ICD-10-CM | POA: Diagnosis not present

## 2012-01-27 DIAGNOSIS — L820 Inflamed seborrheic keratosis: Secondary | ICD-10-CM | POA: Diagnosis not present

## 2012-01-27 DIAGNOSIS — D239 Other benign neoplasm of skin, unspecified: Secondary | ICD-10-CM | POA: Diagnosis not present

## 2012-01-27 DIAGNOSIS — D1800 Hemangioma unspecified site: Secondary | ICD-10-CM | POA: Diagnosis not present

## 2012-03-03 ENCOUNTER — Telehealth: Payer: Self-pay | Admitting: Family Medicine

## 2012-03-03 NOTE — Telephone Encounter (Signed)
Also received call from [place] at [place] [facility]. Pt is new to [facility]; Rosanne Ashing wants to know if pt should be taking Metformin 1000 mg twice a day.Advised Jim Metformin 1000 mg taking 1 tab twice a day is on pts med list and refills are at [facility]. Since pt has appt 03/04/12 to discuss blood sugars, Rosanne Ashing wants call back [phone] after pt is seen to verify how pt to take Metformin.

## 2012-03-03 NOTE — Telephone Encounter (Signed)
Will see her tomorrow as planned 

## 2012-03-03 NOTE — Telephone Encounter (Signed)
Caller: Candis/Patient is calling with a question about Metformin.The medication was written by Roxy Manns A.  Patient states she has been feeling tired.  Onset x 3 weeks.  States does not have her usual stamina to do daily activities.  States a trip to the grocery store is exhausting.  Has been exercising with walking daily.  Taking metformin 1000mg  BID, and glyberide 2 tabs BID.  BS has been lower.  BS before breakfast has been running 104-133 or so; nighttime BS have been 113.  Per protocol, emergent symptoms denied; advised appt within 24 hours.  Appt sched 03/04/12 1600 with Dr. Milinda Antis.

## 2012-03-04 ENCOUNTER — Ambulatory Visit: Payer: Medicare Other | Admitting: Family Medicine

## 2012-03-06 ENCOUNTER — Encounter: Payer: Self-pay | Admitting: Family Medicine

## 2012-03-06 ENCOUNTER — Ambulatory Visit (INDEPENDENT_AMBULATORY_CARE_PROVIDER_SITE_OTHER): Payer: Medicare Other | Admitting: Family Medicine

## 2012-03-06 VITALS — BP 134/84 | HR 97 | Temp 97.5°F | Ht 62.5 in | Wt 157.5 lb

## 2012-03-06 DIAGNOSIS — R5383 Other fatigue: Secondary | ICD-10-CM

## 2012-03-06 DIAGNOSIS — E119 Type 2 diabetes mellitus without complications: Secondary | ICD-10-CM | POA: Diagnosis not present

## 2012-03-06 DIAGNOSIS — R5381 Other malaise: Secondary | ICD-10-CM | POA: Diagnosis not present

## 2012-03-06 MED ORDER — METFORMIN HCL 500 MG PO TABS: 500.0000 mg | ORAL_TABLET | Freq: Two times a day (BID) | ORAL | Status: DC

## 2012-03-06 MED ORDER — GLYBURIDE 5 MG PO TABS: 5.0000 mg | ORAL_TABLET | Freq: Two times a day (BID) | ORAL | Status: DC

## 2012-03-06 NOTE — Progress Notes (Signed)
  Subjective:    Patient ID: Laura Bell, female    DOB: 09-Mar-1936, 76 y.o.   MRN: [id number]  HPI  Is here for fatigue and also diarrhea for 2 days   Diarrhea is on and off  Thinks this may be from the metformin  Also really really tired  Is walking regularly   Some low sugars at times -- especially after breakfast - occas below 60   In am avg in 130 PM 130s-160s with some lows   Goes to [facility]   Does not see DM doctor at this time    Chemistry      Component Value Date/Time   NA 138 01/24/2012 1423   K 3.9 01/24/2012 1423   CL 103 01/24/2012 1423   CO2 27 01/24/2012 1423   BUN 9 01/24/2012 1423   CREATININE 0.7 01/24/2012 1423      Component Value Date/Time   CALCIUM 9.8 01/24/2012 1423   ALKPHOS 57 01/24/2012 1423   AST 17 01/24/2012 1423   ALT 19 01/24/2012 1423   BILITOT 0.2* 01/24/2012 1423      Lab Results  Component Value Date   WBC 5.6 05/31/2011   HGB 13.2 05/31/2011   HCT 39.5 05/31/2011   MCV 89.2 05/31/2011   PLT 221.0 05/31/2011     Review of Systems Review of Systems  Constitutional: Negative for fever, appetite change, and unexpected weight change.  Eyes: Negative for pain and visual disturbance.  Respiratory: Negative for cough and shortness of breath.   Cardiovascular: Negative for cp or palpitations    Gastrointestinal: Negative for nausea and constipation. no vomiting Genitourinary: Negative for urgency and frequency.  Skin: Negative for pallor or rash   Neurological: Negative for  light-headedness, numbness and headaches.  Hematological: Negative for adenopathy. Does not bruise/bleed easily.  Psychiatric/Behavioral: Negative for dysphoric mood. The patient is not nervous/anxious.          Objective:   Physical Exam  Constitutional: She appears well-developed and well-nourished.  HENT:  Head: Normocephalic and atraumatic.  Mouth/Throat: Oropharynx is clear and moist.  Eyes: Conjunctivae and EOM are normal. Pupils are equal, round,  and reactive to light. No scleral icterus.  Neck: Normal range of motion. Neck supple. No JVD present. Carotid bruit is not present. No thyromegaly present.  Cardiovascular: Normal rate, regular rhythm, normal heart sounds and intact distal pulses.  Exam reveals no gallop.   Pulmonary/Chest: Effort normal and breath sounds normal. No respiratory distress. She has no wheezes.  Abdominal: Soft. Bowel sounds are normal. She exhibits no distension, no abdominal bruit and no mass. There is no tenderness. There is no rebound and no guarding.  Musculoskeletal: She exhibits no edema.  Lymphadenopathy:    She has no cervical adenopathy.  Neurological: She is alert. She has normal reflexes. No cranial nerve deficit. She exhibits normal muscle tone. Coordination normal.  Skin: Skin is warm and dry. No rash noted. No erythema. No pallor.  Psychiatric: She has a normal mood and affect.          Assessment & Plan:

## 2012-03-06 NOTE — Assessment & Plan Note (Signed)
DM is better controlled but fatigue and side effects concern me  Will cut glyburide and metformin in 1/ 2 and update  Will continue good diet and exercise and sugar monitoring

## 2012-03-06 NOTE — Patient Instructions (Signed)
Decrease metformin to 500 mg twice daily Decrease glyburide to 5 mg twice daily  If symptoms continue let me know  Eat regular meals and keep up exercise

## 2012-03-06 NOTE — Assessment & Plan Note (Signed)
I think this is due to intermittent low sugars and also poss rxn to meds  Will cut her glyburide and metformin in 1/2 - hope this will help fatigue and diarrhea  Will update  No s/s of infx or other process

## 2012-05-05 ENCOUNTER — Ambulatory Visit (INDEPENDENT_AMBULATORY_CARE_PROVIDER_SITE_OTHER): Payer: Medicare Other | Admitting: Family Medicine

## 2012-05-05 ENCOUNTER — Encounter: Payer: Self-pay | Admitting: Family Medicine

## 2012-05-05 VITALS — BP 146/86 | HR 87 | Temp 97.6°F | Wt 157.0 lb

## 2012-05-05 DIAGNOSIS — J029 Acute pharyngitis, unspecified: Secondary | ICD-10-CM | POA: Diagnosis not present

## 2012-05-05 DIAGNOSIS — J069 Acute upper respiratory infection, unspecified: Secondary | ICD-10-CM

## 2012-05-05 NOTE — Patient Instructions (Addendum)
I think  You are at the early stage of a viral uri (cold) Drink lots of fluids/ rest Tylenol for for fever and sore throat Update if not starting to improve in a week or if worsening
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
## 2012-05-05 NOTE — Assessment & Plan Note (Signed)
Neg rapid strep Think due to viral uri Disc symptomatic care - see instructions on AVS

## 2012-05-09 LAB — POCT RAPID STREP A (OFFICE): Rapid Strep A Screen: NEGATIVE

## 2012-05-27 ENCOUNTER — Other Ambulatory Visit: Payer: Self-pay

## 2012-06-25 ENCOUNTER — Other Ambulatory Visit: Payer: Self-pay | Admitting: *Deleted

## 2012-06-25 MED ORDER — GLYBURIDE 5 MG PO TABS: 5.0000 mg | ORAL_TABLET | Freq: Two times a day (BID) | ORAL | Status: DC

## 2012-07-27 ENCOUNTER — Other Ambulatory Visit: Payer: Self-pay | Admitting: *Deleted

## 2012-07-27 MED ORDER — GLYBURIDE 5 MG PO TABS: 5.0000 mg | ORAL_TABLET | Freq: Two times a day (BID) | ORAL | Status: DC

## 2012-07-27 NOTE — Telephone Encounter (Signed)
Pt has cpx scheduled for 07/29/12

## 2012-07-29 ENCOUNTER — Encounter: Payer: Self-pay | Admitting: Family Medicine

## 2012-07-29 ENCOUNTER — Ambulatory Visit (INDEPENDENT_AMBULATORY_CARE_PROVIDER_SITE_OTHER): Payer: Medicare Other | Admitting: Family Medicine

## 2012-07-29 VITALS — BP 148/86 | HR 108 | Temp 98.1°F | Ht 61.75 in | Wt 152.2 lb

## 2012-07-29 DIAGNOSIS — E782 Mixed hyperlipidemia: Secondary | ICD-10-CM

## 2012-07-29 DIAGNOSIS — Z780 Asymptomatic menopausal state: Secondary | ICD-10-CM | POA: Insufficient documentation

## 2012-07-29 DIAGNOSIS — I10 Essential (primary) hypertension: Secondary | ICD-10-CM | POA: Diagnosis not present

## 2012-07-29 DIAGNOSIS — Z23 Encounter for immunization: Secondary | ICD-10-CM

## 2012-07-29 DIAGNOSIS — E119 Type 2 diabetes mellitus without complications: Secondary | ICD-10-CM

## 2012-07-29 DIAGNOSIS — M949 Disorder of cartilage, unspecified: Secondary | ICD-10-CM | POA: Diagnosis not present

## 2012-07-29 DIAGNOSIS — Z1231 Encounter for screening mammogram for malignant neoplasm of breast: Secondary | ICD-10-CM

## 2012-07-29 DIAGNOSIS — M899 Disorder of bone, unspecified: Secondary | ICD-10-CM

## 2012-07-29 LAB — CBC WITH DIFFERENTIAL/PLATELET
Basophils Relative: 0.4 % (ref 0.0–3.0)
Eosinophils Relative: 1.7 % (ref 0.0–5.0)
HCT: 42 % (ref 36.0–46.0)
Lymphs Abs: 1.3 10*3/uL (ref 0.7–4.0)
MCV: 87.6 fl (ref 78.0–100.0)
Monocytes Absolute: 0.5 10*3/uL (ref 0.1–1.0)
Monocytes Relative: 7.4 % (ref 3.0–12.0)
Platelets: 225 10*3/uL (ref 150.0–400.0)
RBC: 4.8 Mil/uL (ref 3.87–5.11)
WBC: 7 10*3/uL (ref 4.5–10.5)

## 2012-07-29 LAB — COMPREHENSIVE METABOLIC PANEL
Albumin: 3.8 g/dL (ref 3.5–5.2)
Alkaline Phosphatase: 59 U/L (ref 39–117)
BUN: 10 mg/dL (ref 6–23)
CO2: 27 mEq/L (ref 19–32)
GFR: 94.63 mL/min (ref >60.00)
Glucose, Bld: 202 mg/dL — ABNORMAL HIGH (ref 70–99)
Potassium: 4.4 mEq/L (ref 3.5–5.1)
Sodium: 136 mEq/L (ref 135–145)
Total Bilirubin: 0.4 mg/dL (ref 0.3–1.2)
Total Protein: 6.9 g/dL (ref 6.0–8.3)

## 2012-07-29 LAB — LIPID PANEL: Total CHOL/HDL Ratio: 3

## 2012-07-29 LAB — TSH: TSH: 2.98 u[IU]/mL (ref 0.35–5.50)

## 2012-07-29 LAB — HEMOGLOBIN A1C: Hgb A1c MFr Bld: 8.9 % — ABNORMAL HIGH (ref 4.6–6.5)

## 2012-07-29 MED ORDER — GLUCOSE BLOOD VI STRP: ORAL_STRIP | Status: DC
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
## 2012-07-29 NOTE — Patient Instructions (Addendum)
Flu shot today If you are interested in a shingles/zoster vaccine - call your insurance to check on coverage,( you should not get it within 1 month of other vaccines) , then call us for a prescription  for it to take to a pharmacy that gives the shot , or make a nurse visit to get it here depending on your coverage We will refer you for mammogram and bone density at check out Follow up in 3 mo with labs prior for diabetes Try to get 1200-1500 mg of calcium per day with at least 1000 iu of vitamin D - for bone health

## 2012-07-30 LAB — VITAMIN D 25 HYDROXY (VIT D DEFICIENCY, FRACTURES): Vit D, 25-Hydroxy: 25 ng/mL — ABNORMAL LOW (ref 30–89)

## 2012-07-31 NOTE — Assessment & Plan Note (Signed)
DM is in fair control but a1c still over 7 last time Per pt some imp  Lab today  Nl foot exam  She tends to get hypoglycemia easily and worries about that with all medications

## 2012-07-31 NOTE — Assessment & Plan Note (Signed)
Lab today zocor  And diet Disc goals for lipids and reasons to control them Rev labs with pt- from april Rev low sat fat diet in detail

## 2012-07-31 NOTE — Assessment & Plan Note (Signed)
Better on 2nd check- pt was nervous today bp in fair control at this time  No changes needed  Disc lifstyle change with low sodium diet and exercise   Lab today

## 2012-07-31 NOTE — Assessment & Plan Note (Signed)
Scheduled annual screening mammogram Nl breast exam today  Encouraged monthly self exams   

## 2012-07-31 NOTE — Assessment & Plan Note (Signed)
Rev ca and D and safety No fx or falls  D level today

## 2012-08-03 ENCOUNTER — Telehealth: Payer: Self-pay

## 2012-08-03 NOTE — Telephone Encounter (Signed)
Can send it if we have a release of information from the patient

## 2012-08-03 NOTE — Telephone Encounter (Signed)
Randy with [facility] left v/m requesting past 6 months office notes for insurance purpose.Please advise.

## 2012-08-04 NOTE — Telephone Encounter (Signed)
No release on file, called pt to advise but no answer, left voicemail requesting pt to call office, will try to call later

## 2012-08-04 NOTE — Telephone Encounter (Signed)
Pt left v/m request call back [phone].

## 2012-08-04 NOTE — Telephone Encounter (Signed)
Called pt back and no answer, left voicemail letting pt know we need a release on file for [facility]. So we can send them the last 6 months of office notes

## 2012-08-05 NOTE — Telephone Encounter (Signed)
Left 2nd voicemail letting pt know we need a release on file for [facility]. in order to get records sent there, will try to call again

## 2012-08-07 NOTE — Telephone Encounter (Signed)
Carrie received release and faxed last 6 months of office notes

## 2012-08-24 ENCOUNTER — Other Ambulatory Visit: Payer: Self-pay | Admitting: *Deleted

## 2012-08-24 MED ORDER — SIMVASTATIN 20 MG PO TABS: 20.0000 mg | ORAL_TABLET | Freq: Every day | ORAL | Status: DC

## 2012-08-24 MED ORDER — LISINOPRIL 40 MG PO TABS: 40.0000 mg | ORAL_TABLET | Freq: Every day | ORAL | Status: DC

## 2012-08-24 MED ORDER — OMEPRAZOLE 20 MG PO CPDR: 20.0000 mg | DELAYED_RELEASE_CAPSULE | Freq: Every day | ORAL | Status: DC

## 2012-09-01 ENCOUNTER — Other Ambulatory Visit: Payer: Self-pay

## 2012-09-01 ENCOUNTER — Encounter: Payer: Self-pay | Admitting: Family Medicine

## 2012-09-01 ENCOUNTER — Ambulatory Visit (INDEPENDENT_AMBULATORY_CARE_PROVIDER_SITE_OTHER): Payer: Medicare Other | Admitting: Family Medicine

## 2012-09-01 VITALS — BP 150/72 | HR 110 | Temp 97.9°F | Ht 61.75 in | Wt 154.8 lb

## 2012-09-01 DIAGNOSIS — E119 Type 2 diabetes mellitus without complications: Secondary | ICD-10-CM

## 2012-09-01 DIAGNOSIS — F430 Acute stress reaction: Secondary | ICD-10-CM | POA: Diagnosis not present

## 2012-09-01 MED ORDER — METFORMIN HCL 1000 MG PO TABS: 1000.0000 mg | ORAL_TABLET | Freq: Two times a day (BID) | ORAL | Status: DC

## 2012-09-01 MED ORDER — GLUCOSE BLOOD VI STRP: ORAL_STRIP | Status: DC

## 2012-09-01 NOTE — Assessment & Plan Note (Signed)
DM is not well controlled- but recent diet and exercise change have improved home readings Rev diet and exercise Inc metformin to 1000 mg bid - will watch labs F/u feb Adv to update if any problems Disc stressors and emotional eating also

## 2012-09-01 NOTE — Assessment & Plan Note (Signed)
Has a lot of family stressors and pt stays anxious She has a handle on emotional eating now - which is good  Offered counseling- she will think about it  She is also considering going back on zoloft which helped in past -will let me know  Was nervous today >25 min spent with face to face with patient, >50% counseling and/or coordinating care

## 2012-09-01 NOTE — Patient Instructions (Addendum)
Follow up with me in early February with labs prior  Keep eating a diabetic diet  Call and let me know if you want to see a counselor or start back on zoloft  Increase metformin to 1000 mg twice daily (you are currently on 500)

## 2012-09-01 NOTE — Telephone Encounter (Signed)
Jim with [facility] called to verify rx for glucose test strips and Metformin. I gave med and instructions that were sent electronically today.
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
## 2012-09-08 ENCOUNTER — Ambulatory Visit: Payer: Self-pay | Admitting: Family Medicine

## 2012-09-08 DIAGNOSIS — Z13820 Encounter for screening for osteoporosis: Secondary | ICD-10-CM | POA: Diagnosis not present

## 2012-09-08 DIAGNOSIS — Z1231 Encounter for screening mammogram for malignant neoplasm of breast: Secondary | ICD-10-CM | POA: Diagnosis not present

## 2012-09-08 DIAGNOSIS — R928 Other abnormal and inconclusive findings on diagnostic imaging of breast: Secondary | ICD-10-CM | POA: Diagnosis not present

## 2012-09-08 DIAGNOSIS — M810 Age-related osteoporosis without current pathological fracture: Secondary | ICD-10-CM | POA: Diagnosis not present

## 2012-09-14 ENCOUNTER — Encounter: Payer: Self-pay | Admitting: Family Medicine

## 2012-09-18 ENCOUNTER — Encounter: Payer: Self-pay | Admitting: Family Medicine

## 2012-09-21 ENCOUNTER — Encounter: Payer: Self-pay | Admitting: *Deleted

## 2012-09-21 ENCOUNTER — Encounter: Payer: Self-pay | Admitting: Family Medicine

## 2012-10-23 ENCOUNTER — Other Ambulatory Visit (INDEPENDENT_AMBULATORY_CARE_PROVIDER_SITE_OTHER): Payer: Medicare Other

## 2012-10-23 DIAGNOSIS — E119 Type 2 diabetes mellitus without complications: Secondary | ICD-10-CM | POA: Diagnosis not present

## 2012-10-23 DIAGNOSIS — I10 Essential (primary) hypertension: Secondary | ICD-10-CM

## 2012-10-23 LAB — COMPREHENSIVE METABOLIC PANEL
Alkaline Phosphatase: 55 U/L (ref 39–117)
BUN: 10 mg/dL (ref 6–23)
Glucose, Bld: 185 mg/dL — ABNORMAL HIGH (ref 70–99)
Sodium: 138 mEq/L (ref 135–145)
Total Bilirubin: 0.4 mg/dL (ref 0.3–1.2)
Total Protein: 7.1 g/dL (ref 6.0–8.3)

## 2012-10-30 ENCOUNTER — Ambulatory Visit (INDEPENDENT_AMBULATORY_CARE_PROVIDER_SITE_OTHER): Payer: Medicare Other | Admitting: Family Medicine

## 2012-10-30 ENCOUNTER — Encounter: Payer: Self-pay | Admitting: Family Medicine

## 2012-10-30 VITALS — BP 146/90 | HR 96 | Temp 98.3°F | Ht 61.75 in | Wt 155.2 lb

## 2012-10-30 DIAGNOSIS — I10 Essential (primary) hypertension: Secondary | ICD-10-CM | POA: Diagnosis not present

## 2012-10-30 DIAGNOSIS — E119 Type 2 diabetes mellitus without complications: Secondary | ICD-10-CM | POA: Diagnosis not present

## 2012-10-30 DIAGNOSIS — M899 Disorder of bone, unspecified: Secondary | ICD-10-CM | POA: Diagnosis not present

## 2012-10-30 DIAGNOSIS — M949 Disorder of cartilage, unspecified: Secondary | ICD-10-CM | POA: Diagnosis not present

## 2012-10-30 MED ORDER — RALOXIFENE HCL 60 MG PO TABS: 60.0000 mg | ORAL_TABLET | Freq: Every day | ORAL | Status: DC

## 2012-10-30 NOTE — Assessment & Plan Note (Signed)
DM is improving with a1c 7.8- and expect further imp  Disc diet/ exercise Doing well on inc metformin and chem labs are stable also  No hypoglycemia on glyburide 1 daily  F/u 3 mo with labs prior

## 2012-10-30 NOTE — Patient Instructions (Addendum)
Sugar control is improving - keep working on diet and exercise  Here is a px for evista- a medicine for osteoporosis - see if this is affordable for you  Take your calcium and vitamin D daily  Follow up in 3 months with labs prior Also keep check on your blood pressure at home - it is high today but that may be because you did not take your medicine today
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
## 2012-10-30 NOTE — Assessment & Plan Note (Signed)
bp up a bit today- but forgot to take bp med this am She will check at home to make sure it is well controlled

## 2012-10-30 NOTE — Assessment & Plan Note (Signed)
Rev dexa - [place] in FN and LS and OP in forearm Intol of bisphosphenates due to GI issues - tried fosamax and boniva  Will consider evista- info and px given  Disc poss side eff No falls or fx Rev ca and D

## 2012-11-01 ENCOUNTER — Telehealth: Payer: Self-pay | Admitting: Family Medicine

## 2012-11-01 NOTE — Telephone Encounter (Signed)
Message copied by Judy Pimple on Sun Nov 01, 2012 12:24 PM ------      Message from: Baldomero Lamy      Created: Fri Oct 23, 2012  1:18 PM      Regarding: 3 mo f/u labs Mon 11/02/12       Please order  future f/u labs for pt's upcomming lab appt.      Thanks      Rodney Booze

## 2012-11-02 ENCOUNTER — Other Ambulatory Visit: Payer: Medicare Other

## 2012-11-06 ENCOUNTER — Ambulatory Visit: Payer: Medicare Other | Admitting: Family Medicine

## 2013-01-04 ENCOUNTER — Other Ambulatory Visit: Payer: Self-pay | Admitting: *Deleted

## 2013-01-04 MED ORDER — GLYBURIDE 5 MG PO TABS: 5.0000 mg | ORAL_TABLET | Freq: Two times a day (BID) | ORAL | Status: DC

## 2013-01-19 ENCOUNTER — Telehealth: Payer: Self-pay | Admitting: Family Medicine

## 2013-01-19 DIAGNOSIS — E119 Type 2 diabetes mellitus without complications: Secondary | ICD-10-CM

## 2013-01-19 DIAGNOSIS — E782 Mixed hyperlipidemia: Secondary | ICD-10-CM

## 2013-01-19 DIAGNOSIS — M899 Disorder of bone, unspecified: Secondary | ICD-10-CM

## 2013-01-19 DIAGNOSIS — M949 Disorder of cartilage, unspecified: Secondary | ICD-10-CM

## 2013-01-19 DIAGNOSIS — I10 Essential (primary) hypertension: Secondary | ICD-10-CM

## 2013-01-19 NOTE — Telephone Encounter (Signed)
Message copied by Judy Pimple on Tue Jan 19, 2013  5:50 PM ------      Message from: Alvina Chou      Created: Tue Jan 12, 2013 12:10 PM      Regarding: lab orders for Wednesday, 4.23.14       Labs for a 3 month f/u ------

## 2013-01-20 ENCOUNTER — Other Ambulatory Visit (INDEPENDENT_AMBULATORY_CARE_PROVIDER_SITE_OTHER): Payer: Medicare Other

## 2013-01-20 DIAGNOSIS — E119 Type 2 diabetes mellitus without complications: Secondary | ICD-10-CM | POA: Diagnosis not present

## 2013-01-20 DIAGNOSIS — I10 Essential (primary) hypertension: Secondary | ICD-10-CM | POA: Diagnosis not present

## 2013-01-20 DIAGNOSIS — E782 Mixed hyperlipidemia: Secondary | ICD-10-CM | POA: Diagnosis not present

## 2013-01-20 LAB — HEMOGLOBIN A1C: Hgb A1c MFr Bld: 7.7 % — ABNORMAL HIGH (ref 4.6–6.5)

## 2013-01-20 LAB — COMPREHENSIVE METABOLIC PANEL
AST: 11 U/L (ref 0–37)
Alkaline Phosphatase: 52 U/L (ref 39–117)
BUN: 10 mg/dL (ref 6–23)
Glucose, Bld: 165 mg/dL — ABNORMAL HIGH (ref 70–99)
Potassium: 4.4 mEq/L (ref 3.5–5.1)
Sodium: 139 mEq/L (ref 135–145)
Total Bilirubin: 0.5 mg/dL (ref 0.3–1.2)
Total Protein: 6.7 g/dL (ref 6.0–8.3)

## 2013-01-20 LAB — LIPID PANEL
HDL: 52 mg/dL (ref >39.00)
LDL Cholesterol: 98 mg/dL (ref 0–99)
VLDL: 16 mg/dL (ref 0.0–40.0)

## 2013-01-27 ENCOUNTER — Ambulatory Visit (INDEPENDENT_AMBULATORY_CARE_PROVIDER_SITE_OTHER): Payer: Medicare Other | Admitting: Family Medicine

## 2013-01-27 ENCOUNTER — Encounter: Payer: Self-pay | Admitting: Family Medicine

## 2013-01-27 VITALS — BP 126/68 | HR 101 | Temp 98.1°F | Ht 61.75 in | Wt 151.8 lb

## 2013-01-27 DIAGNOSIS — I10 Essential (primary) hypertension: Secondary | ICD-10-CM | POA: Diagnosis not present

## 2013-01-27 DIAGNOSIS — E119 Type 2 diabetes mellitus without complications: Secondary | ICD-10-CM | POA: Diagnosis not present

## 2013-01-27 DIAGNOSIS — A088 Other specified intestinal infections: Secondary | ICD-10-CM | POA: Diagnosis not present

## 2013-01-27 DIAGNOSIS — A084 Viral intestinal infection, unspecified: Secondary | ICD-10-CM

## 2013-01-27 NOTE — Patient Instructions (Addendum)
Don't forget to make your annual eye doctor appt  Check sugar twice daily -am and then 2 hours after a meal  We will refer you to diabetic teaching at check out [employer] for 30 minutes of exercise at least 5 times per week -indoors or out  Follow up with me in about 3 months with labs prior

## 2013-01-27 NOTE — Assessment & Plan Note (Signed)
bp in fair control at this time  No changes needed  Disc lifstyle change with low sodium diet and exercise   

## 2013-01-27 NOTE — Assessment & Plan Note (Signed)
A1c is fairly stable at 7.8 Pt does not know how to eat for DM- will ref to DM ed Disc exercise in detail  Will check sugar bid and keep diet journal  F/ u 3 mo May end up needing injectable med

## 2013-01-27 NOTE — Assessment & Plan Note (Signed)
Much imp since Friday Reassuring exam Disc need for fluids and bland diet -adv as tol Update if not starting to improve in a week or if worsening
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
## 2013-02-02 ENCOUNTER — Other Ambulatory Visit: Payer: Self-pay | Admitting: *Deleted

## 2013-02-02 MED ORDER — LISINOPRIL 40 MG PO TABS: 40.0000 mg | ORAL_TABLET | Freq: Every day | ORAL | Status: DC

## 2013-02-02 MED ORDER — OMEPRAZOLE 20 MG PO CPDR: 20.0000 mg | DELAYED_RELEASE_CAPSULE | Freq: Every day | ORAL | Status: DC

## 2013-02-02 MED ORDER — SIMVASTATIN 20 MG PO TABS: 20.0000 mg | ORAL_TABLET | Freq: Every day | ORAL | Status: DC

## 2013-02-16 DIAGNOSIS — E119 Type 2 diabetes mellitus without complications: Secondary | ICD-10-CM | POA: Diagnosis not present

## 2013-02-26 ENCOUNTER — Other Ambulatory Visit: Payer: Self-pay | Admitting: Family Medicine

## 2013-02-26 NOTE — Telephone Encounter (Signed)
Received fax refill request for pharmacy, there is a note on it that said that pt indicated that she is only taking this once daily and not BID, and if this is correct we need to send a new Rx in with new directions, please advise

## 2013-02-28 NOTE — Telephone Encounter (Signed)
Please check with pt to verify this and then refil for 12 mo with new inst. , thanks

## 2013-03-01 MED ORDER — GLYBURIDE 5 MG PO TABS: 5.0000 mg | ORAL_TABLET | Freq: Two times a day (BID) | ORAL | Status: DC

## 2013-03-01 NOTE — Telephone Encounter (Signed)
Pt said last time she was here her instructions you gave her said increase it to BID so pt will start taking it BID, Rx sent in with correct instructions

## 2013-03-11 ENCOUNTER — Ambulatory Visit: Payer: Self-pay | Admitting: Family Medicine

## 2013-03-11 DIAGNOSIS — I10 Essential (primary) hypertension: Secondary | ICD-10-CM | POA: Diagnosis not present

## 2013-03-11 DIAGNOSIS — E119 Type 2 diabetes mellitus without complications: Secondary | ICD-10-CM | POA: Diagnosis not present

## 2013-03-11 DIAGNOSIS — Z7189 Other specified counseling: Secondary | ICD-10-CM | POA: Diagnosis not present

## 2013-03-11 DIAGNOSIS — E785 Hyperlipidemia, unspecified: Secondary | ICD-10-CM | POA: Diagnosis not present

## 2013-03-30 ENCOUNTER — Ambulatory Visit: Payer: Self-pay | Admitting: Family Medicine

## 2013-03-30 DIAGNOSIS — E119 Type 2 diabetes mellitus without complications: Secondary | ICD-10-CM | POA: Diagnosis not present

## 2013-03-30 DIAGNOSIS — Z7189 Other specified counseling: Secondary | ICD-10-CM | POA: Diagnosis not present

## 2013-03-30 DIAGNOSIS — E785 Hyperlipidemia, unspecified: Secondary | ICD-10-CM | POA: Diagnosis not present

## 2013-03-30 DIAGNOSIS — I10 Essential (primary) hypertension: Secondary | ICD-10-CM | POA: Diagnosis not present

## 2013-04-08 ENCOUNTER — Other Ambulatory Visit: Payer: Self-pay

## 2013-04-13 ENCOUNTER — Telehealth: Payer: Self-pay | Admitting: Family Medicine

## 2013-04-13 DIAGNOSIS — E119 Type 2 diabetes mellitus without complications: Secondary | ICD-10-CM

## 2013-04-13 NOTE — Telephone Encounter (Signed)
Message copied by Judy Pimple on Tue Apr 13, 2013 10:20 PM ------      Message from: Alvina Chou      Created: Tue Apr 13, 2013 11:28 AM      Regarding: Lab orders for Wednesday, 7.23.14       Labs for a f/u appt ------

## 2013-04-21 ENCOUNTER — Other Ambulatory Visit (INDEPENDENT_AMBULATORY_CARE_PROVIDER_SITE_OTHER): Payer: Medicare Other

## 2013-04-21 DIAGNOSIS — E119 Type 2 diabetes mellitus without complications: Secondary | ICD-10-CM | POA: Diagnosis not present

## 2013-04-21 LAB — HEMOGLOBIN A1C: Hgb A1c MFr Bld: 7.6 % — ABNORMAL HIGH (ref 4.6–6.5)

## 2013-04-28 ENCOUNTER — Encounter: Payer: Self-pay | Admitting: Family Medicine

## 2013-04-28 ENCOUNTER — Ambulatory Visit (INDEPENDENT_AMBULATORY_CARE_PROVIDER_SITE_OTHER): Payer: Medicare Other | Admitting: Family Medicine

## 2013-04-28 VITALS — BP 140/80 | HR 92 | Temp 97.9°F | Ht 61.75 in | Wt 149.8 lb

## 2013-04-28 DIAGNOSIS — E119 Type 2 diabetes mellitus without complications: Secondary | ICD-10-CM | POA: Diagnosis not present

## 2013-04-28 DIAGNOSIS — I10 Essential (primary) hypertension: Secondary | ICD-10-CM

## 2013-04-28 NOTE — Assessment & Plan Note (Signed)
New diet/exercise changes after DM ed are positive  Suspect- given good sugars at home (will have her check at some alt times too)- that her A1C should improve significantly next time  Otherwise we will have to investigate other med options Rev diet/exercise On ace utd opthy

## 2013-04-28 NOTE — Patient Instructions (Addendum)
Work hard on diet and exercise for diabetes  Keep an eye on your blood pressure  Schedule follow up in about 3 months for annual exam with labs prior  Take care of yourself

## 2013-04-28 NOTE — Assessment & Plan Note (Signed)
Better today on 2nd check 140/80 Per pt at home avg 130/80 or lower- some whitecoat component No change in medicines F/u 3 mo
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
## 2013-04-29 ENCOUNTER — Other Ambulatory Visit: Payer: Self-pay | Admitting: *Deleted

## 2013-04-29 MED ORDER — METFORMIN HCL 1000 MG PO TABS: 1000.0000 mg | ORAL_TABLET | Freq: Two times a day (BID) | ORAL | Status: DC

## 2013-04-30 ENCOUNTER — Ambulatory Visit: Payer: Self-pay | Admitting: Family Medicine

## 2013-07-01 DIAGNOSIS — Z23 Encounter for immunization: Secondary | ICD-10-CM | POA: Diagnosis not present

## 2013-07-05 ENCOUNTER — Ambulatory Visit (INDEPENDENT_AMBULATORY_CARE_PROVIDER_SITE_OTHER): Payer: Medicare Other | Admitting: Family Medicine

## 2013-07-05 ENCOUNTER — Ambulatory Visit (INDEPENDENT_AMBULATORY_CARE_PROVIDER_SITE_OTHER)
Admission: RE | Admit: 2013-07-05 | Discharge: 2013-07-05 | Disposition: A | Discharge status: Home / Self Care | Payer: Medicare Other | Source: Ambulatory Visit | Attending: Family Medicine | Admitting: Family Medicine

## 2013-07-05 ENCOUNTER — Encounter: Payer: Self-pay | Admitting: Family Medicine

## 2013-07-05 VITALS — BP 148/84 | HR 104 | Temp 98.1°F | Ht 61.75 in | Wt 150.5 lb

## 2013-07-05 DIAGNOSIS — M79604 Pain in right leg: Secondary | ICD-10-CM

## 2013-07-05 DIAGNOSIS — M79609 Pain in unspecified limb: Secondary | ICD-10-CM

## 2013-07-05 DIAGNOSIS — L738 Other specified follicular disorders: Secondary | ICD-10-CM | POA: Diagnosis not present

## 2013-07-05 DIAGNOSIS — M169 Osteoarthritis of hip, unspecified: Secondary | ICD-10-CM | POA: Diagnosis not present

## 2013-07-05 DIAGNOSIS — L731 Pseudofolliculitis barbae: Secondary | ICD-10-CM | POA: Insufficient documentation

## 2013-07-05 NOTE — Patient Instructions (Addendum)
Xray of hips today  We will call you with a result  For the ingrown hair on buttock- use warm compress/ keep clean and also can use antibiotic ointment over the counter-- if this worsens or develops into a rash let me know  Keep using eucerin cream on rash on leg
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
## 2013-07-05 NOTE — Assessment & Plan Note (Signed)
On R buttock  Disc use of warm compresses and careful cleansing with soap and water  abx ointment otc prn Update if not starting to improve in a week or if worsening

## 2013-07-05 NOTE — Assessment & Plan Note (Signed)
With hip pain and limited rom on exam  No edema appreciated today  Hip xrays today  Recommend elevation of legs when able

## 2013-07-06 ENCOUNTER — Telehealth: Payer: Self-pay | Admitting: Family Medicine

## 2013-07-06 DIAGNOSIS — M1990 Unspecified osteoarthritis, unspecified site: Secondary | ICD-10-CM

## 2013-07-06 DIAGNOSIS — M79604 Pain in right leg: Secondary | ICD-10-CM

## 2013-07-06 NOTE — Telephone Encounter (Signed)
Message copied by Judy Pimple on Tue Jul 06, 2013  5:22 PM ------      Message from: Shon Millet      Created: Tue Jul 06, 2013  4:43 PM       Pt notified of xray results and agrees with referral, I advise pt that Marion/Linda will call to set up appt ------

## 2013-07-06 NOTE — Telephone Encounter (Signed)
Ref done  

## 2013-07-21 DIAGNOSIS — M1610 Unilateral primary osteoarthritis, unspecified hip: Secondary | ICD-10-CM | POA: Diagnosis not present

## 2013-07-21 DIAGNOSIS — M1710 Unilateral primary osteoarthritis, unspecified knee: Secondary | ICD-10-CM | POA: Diagnosis not present

## 2013-08-03 ENCOUNTER — Other Ambulatory Visit: Payer: Self-pay | Admitting: *Deleted

## 2013-08-03 MED ORDER — SIMVASTATIN 20 MG PO TABS: 20.0000 mg | ORAL_TABLET | Freq: Every day | ORAL | Status: DC

## 2013-08-03 MED ORDER — LISINOPRIL 40 MG PO TABS: 40.0000 mg | ORAL_TABLET | Freq: Every day | ORAL | Status: DC

## 2013-08-13 ENCOUNTER — Telehealth: Payer: Self-pay | Admitting: Family Medicine

## 2013-08-13 NOTE — Telephone Encounter (Signed)
Pt left vm requesting cb regarding a question she had about medication.  Called pt back, unable to leave vm because vm was not set up.

## 2013-08-16 ENCOUNTER — Telehealth: Payer: Self-pay

## 2013-08-16 NOTE — Telephone Encounter (Signed)
Main risk is of hypoglycemia - so let me know if she is having any low sugars and we can also discuss in more detail at her visit If no low sugar readings continue it

## 2013-08-16 NOTE — Telephone Encounter (Signed)
Pt got letter from her insurance co, [employer] stating that the gyburide med is a high risk medications and can cause side effects in some pts and to discuss with PCP. Pt has appt with Dr Milinda Antis 08/31/13 for CPX. Pt wants to know if should continue taking glyburide 5 mg twice daily until brings in letter at her appt on 08/31/13.pt request cb.

## 2013-08-17 NOTE — Telephone Encounter (Signed)
Pt notified of Dr. Royden Purl comments and verbalized understanding, pt said she hasn't had any really low sugar readings so she will stay on med

## 2013-08-24 ENCOUNTER — Telehealth: Payer: Self-pay | Admitting: Family Medicine

## 2013-08-24 DIAGNOSIS — M899 Disorder of bone, unspecified: Secondary | ICD-10-CM

## 2013-08-24 DIAGNOSIS — E119 Type 2 diabetes mellitus without complications: Secondary | ICD-10-CM

## 2013-08-24 DIAGNOSIS — I10 Essential (primary) hypertension: Secondary | ICD-10-CM

## 2013-08-24 DIAGNOSIS — E782 Mixed hyperlipidemia: Secondary | ICD-10-CM

## 2013-08-24 NOTE — Telephone Encounter (Signed)
Message copied by Judy Pimple on Tue Aug 24, 2013  5:38 PM ------      Message from: Alvina Chou      Created: Fri Aug 20, 2013  9:47 AM      Regarding: Lab orders for Wednesday, 11.26.14       Patient is scheduled for CPX labs, please order future labs, Thanks , Terri       ------

## 2013-08-25 ENCOUNTER — Other Ambulatory Visit (INDEPENDENT_AMBULATORY_CARE_PROVIDER_SITE_OTHER): Payer: Medicare Other

## 2013-08-25 DIAGNOSIS — I10 Essential (primary) hypertension: Secondary | ICD-10-CM | POA: Diagnosis not present

## 2013-08-25 DIAGNOSIS — E782 Mixed hyperlipidemia: Secondary | ICD-10-CM

## 2013-08-25 DIAGNOSIS — E119 Type 2 diabetes mellitus without complications: Secondary | ICD-10-CM

## 2013-08-25 DIAGNOSIS — M899 Disorder of bone, unspecified: Secondary | ICD-10-CM

## 2013-08-25 LAB — CBC WITH DIFFERENTIAL/PLATELET
Basophils Relative: 0.6 % (ref 0.0–3.0)
Eosinophils Relative: 2.9 % (ref 0.0–5.0)
Hemoglobin: 12.9 g/dL (ref 12.0–15.0)
Lymphocytes Relative: 19 % (ref 12.0–46.0)
MCHC: 33.7 g/dL (ref 30.0–36.0)
Monocytes Absolute: 0.4 10*3/uL (ref 0.1–1.0)
Monocytes Relative: 8 % (ref 3.0–12.0)
Neutro Abs: 3.9 10*3/uL (ref 1.4–7.7)
Neutrophils Relative %: 69.5 % (ref 43.0–77.0)
RBC: 4.47 Mil/uL (ref 3.87–5.11)
WBC: 5.6 10*3/uL (ref 4.5–10.5)

## 2013-08-25 LAB — COMPREHENSIVE METABOLIC PANEL
Albumin: 3.9 g/dL (ref 3.5–5.2)
BUN: 14 mg/dL (ref 6–23)
CO2: 29 mEq/L (ref 19–32)
Calcium: 9.9 mg/dL (ref 8.4–10.5)
Chloride: 103 mEq/L (ref 96–112)
Creatinine, Ser: 0.6 mg/dL (ref 0.4–1.2)
GFR: 105.51 mL/min (ref >60.00)
Glucose, Bld: 161 mg/dL — ABNORMAL HIGH (ref 70–99)
Potassium: 4.3 mEq/L (ref 3.5–5.1)

## 2013-08-25 LAB — LIPID PANEL
Cholesterol: 167 mg/dL (ref 0–200)
HDL: 55.1 mg/dL (ref >39.00)
LDL Cholesterol: 96 mg/dL (ref 0–99)
Triglycerides: 79 mg/dL (ref 0.0–149.0)

## 2013-08-25 LAB — HEMOGLOBIN A1C: Hgb A1c MFr Bld: 7.5 % — ABNORMAL HIGH (ref 4.6–6.5)

## 2013-08-26 LAB — VITAMIN D 25 HYDROXY (VIT D DEFICIENCY, FRACTURES): Vit D, 25-Hydroxy: 30 ng/mL (ref 30–89)

## 2013-08-31 ENCOUNTER — Ambulatory Visit (INDEPENDENT_AMBULATORY_CARE_PROVIDER_SITE_OTHER): Payer: Medicare Other | Admitting: Family Medicine

## 2013-08-31 ENCOUNTER — Encounter: Payer: Self-pay | Admitting: Family Medicine

## 2013-08-31 VITALS — BP 124/70 | HR 100 | Temp 97.9°F | Ht 62.0 in | Wt 150.2 lb

## 2013-08-31 DIAGNOSIS — I10 Essential (primary) hypertension: Secondary | ICD-10-CM | POA: Diagnosis not present

## 2013-08-31 DIAGNOSIS — Z0000 Encounter for general adult medical examination without abnormal findings: Secondary | ICD-10-CM | POA: Diagnosis not present

## 2013-08-31 DIAGNOSIS — E782 Mixed hyperlipidemia: Secondary | ICD-10-CM

## 2013-08-31 DIAGNOSIS — E119 Type 2 diabetes mellitus without complications: Secondary | ICD-10-CM | POA: Diagnosis not present

## 2013-08-31 DIAGNOSIS — L299 Pruritus, unspecified: Secondary | ICD-10-CM

## 2013-08-31 DIAGNOSIS — M899 Disorder of bone, unspecified: Secondary | ICD-10-CM | POA: Diagnosis not present

## 2013-08-31 DIAGNOSIS — R197 Diarrhea, unspecified: Secondary | ICD-10-CM

## 2013-08-31 MED ORDER — OMEPRAZOLE 20 MG PO CPDR: 20.0000 mg | DELAYED_RELEASE_CAPSULE | Freq: Every day | ORAL | Status: DC

## 2013-08-31 MED ORDER — GLUCOSE BLOOD VI STRP: ORAL_STRIP | Status: DC

## 2013-08-31 MED ORDER — METFORMIN HCL 1000 MG PO TABS: 1000.0000 mg | ORAL_TABLET | Freq: Two times a day (BID) | ORAL | Status: DC

## 2013-08-31 NOTE — Progress Notes (Signed)
Pre-visit discussion using our clinic review tool. No additional management support is needed unless otherwise documented below in the visit note.  
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
## 2013-08-31 NOTE — Patient Instructions (Signed)
Call your orthopedic office (Dr Katrinka Blazing)- and let them know you may be getting a rash and diarrhea from the lodine (new medicine)- for now stop it If you are interested in a shingles/zoster vaccine - call your insurance to check on coverage,( you should not get it within 1 month of other vaccines) , then call us for a prescription  for it to take to a pharmacy that gives the shot , or make a nurse visit to get it here depending on your coverage  add another 2000 iu of vitamin D daily for bone health  Work hard on sticking to a diabetic diet and also stay as active as you can to get sugar levels down  Follow up for diabetes in 3 months with labs prior

## 2013-09-01 NOTE — Assessment & Plan Note (Signed)
With mild excoriations (per pt rash with tiny bumps) Started with lodine- suspect allergy She will stop it and call her orthopedic phys

## 2013-09-01 NOTE — Assessment & Plan Note (Signed)
Reviewed health habits including diet and exercise and skin cancer prevention Reviewed appropriate screening tests for age  Also reviewed health mt list, fam hx and immunization status , as well as social and family history   See HPI Rev wellness labs

## 2013-09-01 NOTE — Assessment & Plan Note (Signed)
Reassuring exam Suspect from lodine Pt will call her orthop about that

## 2013-09-01 NOTE — Assessment & Plan Note (Signed)
Improved but not opt control Lab Results  Component Value Date   HGBA1C 7.5* 08/25/2013   Hesitant to inc her sulfonurea due to risks of low glucose Pt has done DM teaching and plans to imp diet further Counseled on low glycemic diet and exercise as tolerated

## 2013-09-01 NOTE — Assessment & Plan Note (Signed)
Disc goals for lipids and reasons to control them Rev labs with pt Rev low sat fat diet in detail On zocor and diet

## 2013-09-01 NOTE — Assessment & Plan Note (Signed)
BP: 124/70 mmHg  bp in fair control at this time  No changes needed Disc lifstyle change with low sodium diet and exercise  Disc DASH diet

## 2013-09-01 NOTE — Assessment & Plan Note (Signed)
Pt declined evista and does not tolerate bisphosphenates No fx Disc need for calcium/ vitamin D/ wt bearing exercise and bone density test every 2 y to monitor Disc safety/ fracture risk in detail   Will inc her D intake as well

## 2013-09-14 ENCOUNTER — Encounter: Payer: Self-pay | Admitting: Family Medicine

## 2013-09-14 ENCOUNTER — Ambulatory Visit: Payer: Self-pay | Admitting: Family Medicine

## 2013-09-14 DIAGNOSIS — Z1231 Encounter for screening mammogram for malignant neoplasm of breast: Secondary | ICD-10-CM | POA: Diagnosis not present

## 2013-09-14 DIAGNOSIS — R928 Other abnormal and inconclusive findings on diagnostic imaging of breast: Secondary | ICD-10-CM | POA: Diagnosis not present

## 2013-09-29 ENCOUNTER — Telehealth: Payer: Self-pay | Admitting: *Deleted

## 2013-09-29 ENCOUNTER — Ambulatory Visit: Payer: Self-pay | Admitting: Family Medicine

## 2013-09-29 DIAGNOSIS — R928 Other abnormal and inconclusive findings on diagnostic imaging of breast: Secondary | ICD-10-CM | POA: Diagnosis not present

## 2013-09-29 DIAGNOSIS — N63 Unspecified lump in unspecified breast: Secondary | ICD-10-CM | POA: Diagnosis not present

## 2013-09-29 NOTE — Telephone Encounter (Signed)
Laura Bell with [facility] called and said pt had her additional images today and the radiologist recommends a [place] core biopsy of left breast. Marchelle Folks was calling to get verbal okay to schedule appt. Per Dr. Milinda Antis verbal okay given and pt will have procedure on 10/01/12.  H&P faxed to [facility]

## 2013-09-30 HISTORY — PX: BREAST BIOPSY: SHX20

## 2013-09-30 HISTORY — PX: OTHER SURGICAL HISTORY: SHX169

## 2013-10-01 ENCOUNTER — Ambulatory Visit: Payer: Self-pay | Admitting: Family Medicine

## 2013-10-01 DIAGNOSIS — N6039 Fibrosclerosis of unspecified breast: Secondary | ICD-10-CM | POA: Diagnosis not present

## 2013-10-01 DIAGNOSIS — N63 Unspecified lump in unspecified breast: Secondary | ICD-10-CM | POA: Diagnosis not present

## 2013-10-01 DIAGNOSIS — R920 Mammographic microcalcification found on diagnostic imaging of breast: Secondary | ICD-10-CM | POA: Diagnosis not present

## 2013-10-01 DIAGNOSIS — D249 Benign neoplasm of unspecified breast: Secondary | ICD-10-CM | POA: Diagnosis not present

## 2013-10-04 ENCOUNTER — Encounter: Payer: Self-pay | Admitting: Family Medicine

## 2013-10-06 ENCOUNTER — Encounter: Payer: Self-pay | Admitting: Family Medicine

## 2013-10-11 ENCOUNTER — Encounter: Payer: Self-pay | Admitting: Family Medicine

## 2013-10-11 ENCOUNTER — Ambulatory Visit: Payer: Self-pay | Admitting: Family Medicine

## 2013-10-11 DIAGNOSIS — D0590 Unspecified type of carcinoma in situ of unspecified breast: Secondary | ICD-10-CM | POA: Diagnosis not present

## 2013-10-11 DIAGNOSIS — N63 Unspecified lump in unspecified breast: Secondary | ICD-10-CM | POA: Diagnosis not present

## 2013-10-12 LAB — PATHOLOGY REPORT

## 2013-10-13 ENCOUNTER — Encounter: Payer: Self-pay | Admitting: Family Medicine

## 2013-10-14 ENCOUNTER — Encounter: Payer: Self-pay | Admitting: Family Medicine

## 2013-10-14 ENCOUNTER — Telehealth: Payer: Self-pay | Admitting: *Deleted

## 2013-10-14 DIAGNOSIS — D0510 Intraductal carcinoma in situ of unspecified breast: Secondary | ICD-10-CM | POA: Insufficient documentation

## 2013-10-14 LAB — PATHOLOGY REPORT

## 2013-10-14 NOTE — Telephone Encounter (Signed)
Dr. Rosana Hoes a Radiologist with [facility] called to leave a message for Dr. Glori Bickers.  Dr. Rosana Hoes aware you spoke with pathology about pt yesterday but he has to advise you that he gave the results of pt's biopsy to pt today and she needs a surgical consultation which he said we would have to put the referral in for it, Dr. Rosana Hoes just wanted to make sure you are aware

## 2013-10-14 NOTE — Telephone Encounter (Signed)
I am aware- will do surgical referral now

## 2013-10-15 NOTE — Telephone Encounter (Signed)
Appt made with Dr Marlou Starks on 10/22/13 patient aware of appt.

## 2013-10-20 ENCOUNTER — Telehealth: Payer: Self-pay | Admitting: Family Medicine

## 2013-10-20 NOTE — Telephone Encounter (Signed)
Randie from [facility] called requesting a letter with all of pt's OV dates from the past 6 months for insurance purposes.  She said she needs to keep this on file for pt's testing strips.  Ok to fax over?  Fax # [phone] attn: Sherin Quarry

## 2013-10-20 NOTE — Telephone Encounter (Signed)
Not sure if pt needs to sign a release for that - please make sure- and then it is fine

## 2013-10-21 NOTE — Telephone Encounter (Signed)
Spoke with Laura Bell and no release needed for pharmacy, OV notes sent to [facility]

## 2013-10-22 ENCOUNTER — Encounter (INDEPENDENT_AMBULATORY_CARE_PROVIDER_SITE_OTHER): Payer: Self-pay | Admitting: General Surgery

## 2013-10-22 ENCOUNTER — Encounter (INDEPENDENT_AMBULATORY_CARE_PROVIDER_SITE_OTHER): Payer: Self-pay

## 2013-10-22 ENCOUNTER — Ambulatory Visit (INDEPENDENT_AMBULATORY_CARE_PROVIDER_SITE_OTHER): Payer: Medicare Other | Admitting: General Surgery

## 2013-10-22 VITALS — BP 126/80 | HR 76 | Temp 98.3°F | Resp 14 | Ht 62.0 in | Wt 149.8 lb

## 2013-10-22 DIAGNOSIS — D0590 Unspecified type of carcinoma in situ of unspecified breast: Secondary | ICD-10-CM | POA: Diagnosis not present

## 2013-10-22 DIAGNOSIS — D0510 Intraductal carcinoma in situ of unspecified breast: Secondary | ICD-10-CM

## 2013-10-22 NOTE — Patient Instructions (Signed)
Plan for left breast wire localized lumpectomy
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
## 2013-10-25 ENCOUNTER — Telehealth (INDEPENDENT_AMBULATORY_CARE_PROVIDER_SITE_OTHER): Payer: Self-pay | Admitting: General Surgery

## 2013-10-25 NOTE — Telephone Encounter (Signed)
Daughter of pt called with some generalized questions about her Mom's lumpectomy.  All answered to her satisfaction.  Reminded they to hold ASA, Advil and Aleve for 5 days before surgery.

## 2013-10-31 HISTORY — PX: BREAST LUMPECTOMY: SHX2

## 2013-11-03 ENCOUNTER — Encounter (HOSPITAL_BASED_OUTPATIENT_CLINIC_OR_DEPARTMENT_OTHER): Payer: Self-pay | Admitting: *Deleted

## 2013-11-03 NOTE — Progress Notes (Signed)
To come in for ekg-bmet 

## 2013-11-08 ENCOUNTER — Encounter (HOSPITAL_BASED_OUTPATIENT_CLINIC_OR_DEPARTMENT_OTHER)
Admission: RE | Admit: 2013-11-08 | Discharge: 2013-11-08 | Disposition: A | Discharge status: Home / Self Care | Payer: Medicare Other | Source: Ambulatory Visit | Attending: General Surgery | Admitting: General Surgery

## 2013-11-08 ENCOUNTER — Other Ambulatory Visit: Payer: Self-pay

## 2013-11-08 DIAGNOSIS — Z87891 Personal history of nicotine dependence: Secondary | ICD-10-CM | POA: Diagnosis not present

## 2013-11-08 DIAGNOSIS — K219 Gastro-esophageal reflux disease without esophagitis: Secondary | ICD-10-CM | POA: Diagnosis not present

## 2013-11-08 DIAGNOSIS — E119 Type 2 diabetes mellitus without complications: Secondary | ICD-10-CM | POA: Diagnosis not present

## 2013-11-08 DIAGNOSIS — Z01810 Encounter for preprocedural cardiovascular examination: Secondary | ICD-10-CM | POA: Diagnosis not present

## 2013-11-08 DIAGNOSIS — I10 Essential (primary) hypertension: Secondary | ICD-10-CM | POA: Diagnosis not present

## 2013-11-08 DIAGNOSIS — Z01812 Encounter for preprocedural laboratory examination: Secondary | ICD-10-CM | POA: Diagnosis not present

## 2013-11-08 DIAGNOSIS — D0590 Unspecified type of carcinoma in situ of unspecified breast: Secondary | ICD-10-CM | POA: Diagnosis not present

## 2013-11-08 LAB — BASIC METABOLIC PANEL
BUN: 12 mg/dL (ref 6–23)
CHLORIDE: 100 meq/L (ref 96–112)
CO2: 24 meq/L (ref 19–32)
Calcium: 10.1 mg/dL (ref 8.4–10.5)
Creatinine, Ser: 0.62 mg/dL (ref 0.50–1.10)
GFR calc Af Amer: >90 mL/min (ref >90)
GFR calc non Af Amer: 86 mL/min — ABNORMAL LOW (ref >90)
Glucose, Bld: 248 mg/dL — ABNORMAL HIGH (ref 70–99)
Potassium: 4.4 mEq/L (ref 3.7–5.3)
Sodium: 138 mEq/L (ref 137–147)

## 2013-11-10 ENCOUNTER — Ambulatory Visit (HOSPITAL_BASED_OUTPATIENT_CLINIC_OR_DEPARTMENT_OTHER)
Admission: RE | Admit: 2013-11-10 | Discharge: 2013-11-10 | Disposition: A | Discharge status: Home / Self Care | Payer: Medicare Other | Source: Ambulatory Visit | Attending: General Surgery | Admitting: General Surgery

## 2013-11-10 ENCOUNTER — Encounter (HOSPITAL_BASED_OUTPATIENT_CLINIC_OR_DEPARTMENT_OTHER): Payer: Medicare Other | Admitting: Anesthesiology

## 2013-11-10 ENCOUNTER — Ambulatory Visit
Admission: RE | Admit: 2013-11-10 | Discharge: 2013-11-10 | Disposition: A | Discharge status: Home / Self Care | Payer: Medicare Other | Source: Ambulatory Visit | Attending: General Surgery | Admitting: General Surgery

## 2013-11-10 ENCOUNTER — Encounter (HOSPITAL_BASED_OUTPATIENT_CLINIC_OR_DEPARTMENT_OTHER)
Admission: RE | Disposition: A | Discharge status: Home / Self Care | Payer: Self-pay | Source: Ambulatory Visit | Attending: General Surgery

## 2013-11-10 ENCOUNTER — Encounter (HOSPITAL_BASED_OUTPATIENT_CLINIC_OR_DEPARTMENT_OTHER): Payer: Self-pay | Admitting: Certified Registered"

## 2013-11-10 ENCOUNTER — Ambulatory Visit (HOSPITAL_BASED_OUTPATIENT_CLINIC_OR_DEPARTMENT_OTHER): Payer: Medicare Other | Admitting: Anesthesiology

## 2013-11-10 DIAGNOSIS — Z01810 Encounter for preprocedural cardiovascular examination: Secondary | ICD-10-CM | POA: Insufficient documentation

## 2013-11-10 DIAGNOSIS — Z01812 Encounter for preprocedural laboratory examination: Secondary | ICD-10-CM | POA: Insufficient documentation

## 2013-11-10 DIAGNOSIS — Z87891 Personal history of nicotine dependence: Secondary | ICD-10-CM | POA: Diagnosis not present

## 2013-11-10 DIAGNOSIS — D0590 Unspecified type of carcinoma in situ of unspecified breast: Secondary | ICD-10-CM | POA: Diagnosis not present

## 2013-11-10 DIAGNOSIS — I10 Essential (primary) hypertension: Secondary | ICD-10-CM | POA: Insufficient documentation

## 2013-11-10 DIAGNOSIS — D0510 Intraductal carcinoma in situ of unspecified breast: Secondary | ICD-10-CM

## 2013-11-10 DIAGNOSIS — K219 Gastro-esophageal reflux disease without esophagitis: Secondary | ICD-10-CM | POA: Diagnosis not present

## 2013-11-10 DIAGNOSIS — N6489 Other specified disorders of breast: Secondary | ICD-10-CM | POA: Diagnosis not present

## 2013-11-10 DIAGNOSIS — E119 Type 2 diabetes mellitus without complications: Secondary | ICD-10-CM | POA: Diagnosis not present

## 2013-11-10 HISTORY — PX: BREAST LUMPECTOMY WITH NEEDLE LOCALIZATION: SHX5759

## 2013-11-10 HISTORY — DX: Complete loss of teeth, unspecified cause, unspecified class: Z97.2

## 2013-11-10 HISTORY — DX: Complete loss of teeth, unspecified cause, unspecified class: K08.109

## 2013-11-10 LAB — GLUCOSE, CAPILLARY
GLUCOSE-CAPILLARY: 180 mg/dL — AB (ref 70–99)
Glucose-Capillary: 151 mg/dL — ABNORMAL HIGH (ref 70–99)

## 2013-11-10 LAB — POCT HEMOGLOBIN-HEMACUE: Hemoglobin: 14.7 g/dL (ref 12.0–15.0)

## 2013-11-10 SURGERY — BREAST LUMPECTOMY WITH NEEDLE LOCALIZATION
Anesthesia: General | Site: Breast | Laterality: Left

## 2013-11-10 MED ORDER — BUPIVACAINE-EPINEPHRINE 0.25% -1:200000 IJ SOLN
INTRAMUSCULAR | Status: DC | PRN
  Administered 2013-11-10: 17 mL

## 2013-11-10 MED ORDER — ONDANSETRON HCL 4 MG/2ML IJ SOLN
INTRAMUSCULAR | Status: DC | PRN
  Administered 2013-11-10: 4 mg via INTRAVENOUS

## 2013-11-10 MED ORDER — OXYCODONE-ACETAMINOPHEN 5-325 MG PO TABS: 1.0000 | ORAL_TABLET | ORAL | Status: DC | PRN

## 2013-11-10 MED ORDER — CHLORHEXIDINE GLUCONATE 4 % EX LIQD: 1.0000 "application " | Freq: Once | CUTANEOUS | Status: DC

## 2013-11-10 MED ORDER — FENTANYL CITRATE 0.05 MG/ML IJ SOLN
INTRAMUSCULAR | Status: DC | PRN
  Administered 2013-11-10: 25 ug via INTRAVENOUS
  Administered 2013-11-10: 50 ug via INTRAVENOUS

## 2013-11-10 MED ORDER — LIDOCAINE HCL (PF) 1 % IJ SOLN
INTRAMUSCULAR | Status: AC
  Filled 2013-11-10: qty 30

## 2013-11-10 MED ORDER — FENTANYL CITRATE 0.05 MG/ML IJ SOLN: 50.0000 ug | INTRAMUSCULAR | Status: DC | PRN

## 2013-11-10 MED ORDER — DEXAMETHASONE SODIUM PHOSPHATE 4 MG/ML IJ SOLN
INTRAMUSCULAR | Status: DC | PRN
  Administered 2013-11-10: 4 mg via INTRAVENOUS

## 2013-11-10 MED ORDER — FENTANYL CITRATE 0.05 MG/ML IJ SOLN
INTRAMUSCULAR | Status: AC
  Filled 2013-11-10: qty 6

## 2013-11-10 MED ORDER — LACTATED RINGERS IV SOLN
INTRAVENOUS | Status: DC
Start: 2013-11-10 — End: 2013-11-10
  Administered 2013-11-10: 10:00:00 via INTRAVENOUS

## 2013-11-10 MED ORDER — PROPOFOL 10 MG/ML IV BOLUS
INTRAVENOUS | Status: DC | PRN
  Administered 2013-11-10: 120 mg via INTRAVENOUS

## 2013-11-10 MED ORDER — BUPIVACAINE-EPINEPHRINE PF 0.25-1:200000 % IJ SOLN
INTRAMUSCULAR | Status: AC
  Filled 2013-11-10: qty 30

## 2013-11-10 MED ORDER — LIDOCAINE HCL (CARDIAC) 20 MG/ML IV SOLN
INTRAVENOUS | Status: DC | PRN
  Administered 2013-11-10: 80 mg via INTRAVENOUS

## 2013-11-10 MED ORDER — BUPIVACAINE HCL (PF) 0.25 % IJ SOLN
INTRAMUSCULAR | Status: AC
  Filled 2013-11-10: qty 30

## 2013-11-10 MED ORDER — ONDANSETRON HCL 4 MG/2ML IJ SOLN: 4.0000 mg | Freq: Once | INTRAMUSCULAR | Status: DC | PRN

## 2013-11-10 MED ORDER — OXYCODONE HCL 5 MG PO TABS
ORAL_TABLET | ORAL | Status: AC
  Filled 2013-11-10: qty 1

## 2013-11-10 MED ORDER — FENTANYL CITRATE 0.05 MG/ML IJ SOLN: 25.0000 ug | INTRAMUSCULAR | Status: DC | PRN

## 2013-11-10 MED ORDER — CEFAZOLIN SODIUM-DEXTROSE 2-3 GM-% IV SOLR
2.0000 g | INTRAVENOUS | Status: AC
  Administered 2013-11-10: 2 g via INTRAVENOUS

## 2013-11-10 MED ORDER — EPHEDRINE SULFATE 50 MG/ML IJ SOLN
INTRAMUSCULAR | Status: DC | PRN
Start: 2013-11-10 — End: 2013-11-10
  Administered 2013-11-10: 10 mg via INTRAVENOUS

## 2013-11-10 MED ORDER — MIDAZOLAM HCL 2 MG/2ML IJ SOLN: 1.0000 mg | INTRAMUSCULAR | Status: DC | PRN

## 2013-11-10 MED ORDER — CEFAZOLIN SODIUM-DEXTROSE 2-3 GM-% IV SOLR
INTRAVENOUS | Status: AC
  Filled 2013-11-10: qty 50

## 2013-11-10 SURGICAL SUPPLY — 42 items
BLADE SURG 10 STRL SS (BLADE) ×2 IMPLANT
BLADE SURG 15 STRL LF DISP TIS (BLADE) ×1 IMPLANT
BLADE SURG 15 STRL SS (BLADE) ×1
CANISTER SUCT 1200ML W/VALVE (MISCELLANEOUS) ×2 IMPLANT
CHLORAPREP W/TINT 26ML (MISCELLANEOUS) ×2 IMPLANT
CLIP TI WIDE RED SMALL 6 (CLIP) ×4 IMPLANT
CONT SPEC 4OZ CLIKSEAL STRL BL (MISCELLANEOUS) ×2 IMPLANT
COVER MAYO STAND STRL (DRAPES) ×2 IMPLANT
COVER TABLE BACK 60X90 (DRAPES) ×2 IMPLANT
DECANTER SPIKE VIAL GLASS SM (MISCELLANEOUS) ×2 IMPLANT
DERMABOND ADVANCED (GAUZE/BANDAGES/DRESSINGS) ×1
DERMABOND ADVANCED .7 DNX12 (GAUZE/BANDAGES/DRESSINGS) ×1 IMPLANT
DEVICE DUBIN W/COMP PLATE 8390 (MISCELLANEOUS) ×2 IMPLANT
DRAPE LAPAROSCOPIC ABDOMINAL (DRAPES) ×2 IMPLANT
DRAPE UTILITY XL STRL (DRAPES) ×2 IMPLANT
ELECT COATED BLADE 2.86 ST (ELECTRODE) ×2 IMPLANT
ELECT REM PT RETURN 9FT ADLT (ELECTROSURGICAL) ×2
ELECTRODE REM PT RTRN 9FT ADLT (ELECTROSURGICAL) ×1 IMPLANT
GLOVE BIO SURGEON STRL SZ7.5 (GLOVE) ×2 IMPLANT
GLOVE SURG SS PI 6.5 STRL IVOR (GLOVE) ×2 IMPLANT
GLOVE SURG SS PI 7.5 STRL IVOR (GLOVE) ×4 IMPLANT
GOWN STRL REUS W/ TWL LRG LVL3 (GOWN DISPOSABLE) ×2 IMPLANT
GOWN STRL REUS W/TWL LRG LVL3 (GOWN DISPOSABLE) ×2
KIT MARKER MARGIN INK (KITS) ×2 IMPLANT
NEEDLE HYPO 25X1 1.5 SAFETY (NEEDLE) ×2 IMPLANT
NS IRRIG 1000ML POUR BTL (IV SOLUTION) ×2 IMPLANT
PACK BASIN DAY SURGERY FS (CUSTOM PROCEDURE TRAY) ×2 IMPLANT
PENCIL BUTTON HOLSTER BLD 10FT (ELECTRODE) ×2 IMPLANT
SLEEVE SCD COMPRESS KNEE MED (MISCELLANEOUS) ×2 IMPLANT
SPONGE LAP 18X18 X RAY DECT (DISPOSABLE) ×2 IMPLANT
STAPLER VISISTAT 35W (STAPLE) IMPLANT
SUT MNCRL AB 4-0 PS2 18 (SUTURE) ×2 IMPLANT
SUT MON AB 4-0 PC3 18 (SUTURE) ×2 IMPLANT
SUT SILK 2 0 SH (SUTURE) ×2 IMPLANT
SUT VIC AB 3-0 54X BRD REEL (SUTURE) IMPLANT
SUT VIC AB 3-0 BRD 54 (SUTURE)
SUT VICRYL 3-0 CR8 SH (SUTURE) ×2 IMPLANT
SYR CONTROL 10ML LL (SYRINGE) ×2 IMPLANT
TOWEL OR 17X24 6PK STRL BLUE (TOWEL DISPOSABLE) ×2 IMPLANT
TOWEL OR NON WOVEN STRL DISP B (DISPOSABLE) ×2 IMPLANT
TUBE CONNECTING 20X1/4 (TUBING) ×2 IMPLANT
YANKAUER SUCT BULB TIP NO VENT (SUCTIONS) ×2 IMPLANT

## 2013-11-10 NOTE — Transfer of Care (Signed)
Immediate Anesthesia Transfer of Care Note  Patient: Laura Bell  Procedure(s) Performed: Procedure(s): BREAST LUMPECTOMY WITH NEEDLE LOCALIZATION (Left)  Patient Location: PACU  Anesthesia Type:General  Level of Consciousness: sedated  Airway & Oxygen Therapy: Patient Spontanous Breathing and Patient connected to face mask oxygen  Post-op Assessment: Report given to PACU RN and Post -op Vital signs reviewed and stable  Post vital signs: stable  Complications: No apparent anesthesia complications

## 2013-11-10 NOTE — Op Note (Signed)
11/10/2013  11:10 AM  PATIENT:  Laura Bell  78 y.o. female  PRE-OPERATIVE DIAGNOSIS:  left breast DCIS  POST-OPERATIVE DIAGNOSIS:  left breast DCIS  PROCEDURE:  Procedure(s): BREAST LUMPECTOMY WITH NEEDLE LOCALIZATION (Left)  SURGEON:  Surgeon(s) and Role:    * Merrie Roof, MD - Primary  PHYSICIAN ASSISTANT:   ASSISTANTS: none   ANESTHESIA:   general  EBL:  Total I/O In: 1100 [I.V.:1100] Out: -   BLOOD ADMINISTERED:none  DRAINS: none   LOCAL MEDICATIONS USED:  MARCAINE     SPECIMEN:  Source of Specimen:  left breast tissue with additional superior-medial margin and superior-lateral margin  DISPOSITION OF SPECIMEN:  PATHOLOGY  COUNTS:  YES  TOURNIQUET:  * No tourniquets in log *  DICTATION: .Dragon Dictation After informed consent was obtained the patient was brought to the operating room and placed in the supine position on the operating room table. After adequate induction of general anesthesia the patient's left breast and chest wall area were prepped with ChloraPrep, allowed to dry, and draped in usual sterile manner.  Earlier in the day the patient underwent a wire localization procedure and the wire was entering the left breast in the lower outer quadrant and headed medially. A radial type incision was made overlying the path of the wire with a 15 blade knife. This incision was carried through the skin and subcutaneous tissue sharply with electrocautery. Once the breast tissue was entered the path of the wire could be palpated. A circular portion of breast tissue was excised sharply around the path of the wire with the electrocautery. Once this was removed and the specimen was oriented with the appropriate paint colors. A specimen radiograph showed the clip to be in the center the specimen. The specimen was then sent for intraoperative margin assessment and there appeared to be some fibrotic tissue at the superior margin. Additional superior margin was excised  sharply with the electrocautery. The edges of the cavity were then marked with clips. Hemostasis was achieved using the Bovie electrocautery. The wound was irrigated with copious amounts of saline and infiltrated with quarter percent Marcaine. The deep layer the wound was then closed with interrupted 3-0 Vicryl stitches. The skin was then closed with interrupted 4-0 Monocryl subcuticular stitches. Dermabond dressings were applied. The patient tolerated the procedure well. At the end of the case the needle sponge and instrument counts were correct. The patient was then awakened and taken to recovery in stable condition.  PLAN OF CARE: Discharge to home after PACU  PATIENT DISPOSITION:  PACU - hemodynamically stable.   Delay start of Pharmacological VTE agent (>24hrs) due to surgical blood loss or risk of bleeding: not applicable
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
## 2013-11-10 NOTE — Anesthesia Procedure Notes (Signed)
Procedure Name: LMA Insertion Date/Time: 11/10/2013 9:57 AM Performed by: Semisi Biela Pre-anesthesia Checklist: Patient identified, Emergency Drugs available, Suction available and Patient being monitored Patient Re-evaluated:Patient Re-evaluated prior to inductionOxygen Delivery Method: Circle System Utilized Preoxygenation: Pre-oxygenation with 100% oxygen Intubation Type: IV induction Ventilation: Mask ventilation without difficulty LMA: LMA inserted LMA Size: 4.0 Number of attempts: 1 Airway Equipment and Method: bite block Placement Confirmation: positive ETCO2 and breath sounds checked- equal and bilateral Tube secured with: Tape Dental Injury: Teeth and Oropharynx as per pre-operative assessment

## 2013-11-10 NOTE — Discharge Instructions (Signed)

## 2013-11-10 NOTE — Anesthesia Preprocedure Evaluation (Addendum)
Anesthesia Evaluation  Patient identified by MRN, date of birth, ID band Patient awake    Reviewed: Allergy & Precautions, H&P , NPO status , Patient's Chart, lab work & pertinent test results  Airway Mallampati: I TM Distance: >3 FB Neck ROM: Full    Dental  (+) Teeth Intact, Dental Advisory Given, Upper Dentures, Lower Dentures   Pulmonary former smoker,  breath sounds clear to auscultation        Cardiovascular hypertension, Pt. on medications Rhythm:Regular Rate:Normal     Neuro/Psych    GI/Hepatic GERD-  Medicated and Controlled,  Endo/Other  diabetes, Well Controlled, Type 2, Oral Hypoglycemic Agents  Renal/GU      Musculoskeletal   Abdominal   Peds  Hematology   Anesthesia Other Findings   Reproductive/Obstetrics                         Anesthesia Physical Anesthesia Plan  ASA: III  Anesthesia Plan: General   Post-op Pain Management:    Induction: Intravenous  Airway Management Planned: LMA  Additional Equipment:   Intra-op Plan:   Post-operative Plan: Extubation in OR  Informed Consent: I have reviewed the patients History and Physical, chart, labs and discussed the procedure including the risks, benefits and alternatives for the proposed anesthesia with the patient or authorized representative who has indicated his/her understanding and acceptance.   Dental advisory given  Plan Discussed with: CRNA, Anesthesiologist and Surgeon  Anesthesia Plan Comments:         Anesthesia Quick Evaluation

## 2013-11-10 NOTE — Interval H&P Note (Signed)
History and Physical Interval Note:  11/10/2013 9:27 AM  Laura Bell  has presented today for surgery, with the diagnosis of left breast DCIS  The various methods of treatment have been discussed with the patient and family. After consideration of risks, benefits and other options for treatment, the patient has consented to  Procedure(s): BREAST LUMPECTOMY WITH NEEDLE LOCALIZATION (Left) as a surgical intervention .  The patient's history has been reviewed, patient examined, no change in status, stable for surgery.  I have reviewed the patient's chart and labs.  Questions were answered to the patient's satisfaction.     TOTH III,PAUL S

## 2013-11-10 NOTE — Anesthesia Postprocedure Evaluation (Signed)
  Anesthesia Post-op Note  Patient: Laura Bell  Procedure(s) Performed: Procedure(s): BREAST LUMPECTOMY WITH NEEDLE LOCALIZATION (Left)  Patient Location: PACU  Anesthesia Type:General  Level of Consciousness: awake, alert  and oriented  Airway and Oxygen Therapy: Patient Spontanous Breathing  Post-op Pain: mild  Post-op Assessment: Post-op Vital signs reviewed  Post-op Vital Signs: Reviewed  Complications: No apparent anesthesia complications

## 2013-11-11 ENCOUNTER — Encounter (HOSPITAL_BASED_OUTPATIENT_CLINIC_OR_DEPARTMENT_OTHER): Payer: Self-pay | Admitting: General Surgery

## 2013-11-22 ENCOUNTER — Other Ambulatory Visit (INDEPENDENT_AMBULATORY_CARE_PROVIDER_SITE_OTHER): Payer: Medicare Other

## 2013-11-22 DIAGNOSIS — E119 Type 2 diabetes mellitus without complications: Secondary | ICD-10-CM

## 2013-11-22 LAB — HEMOGLOBIN A1C: HEMOGLOBIN A1C: 7.5 % — AB (ref 4.6–6.5)

## 2013-11-24 ENCOUNTER — Encounter (INDEPENDENT_AMBULATORY_CARE_PROVIDER_SITE_OTHER): Payer: Self-pay | Admitting: General Surgery

## 2013-11-24 ENCOUNTER — Ambulatory Visit (INDEPENDENT_AMBULATORY_CARE_PROVIDER_SITE_OTHER): Payer: Medicare Other | Admitting: General Surgery

## 2013-11-24 VITALS — BP 146/84 | HR 110 | Resp 16 | Ht 62.0 in | Wt 149.0 lb

## 2013-11-24 DIAGNOSIS — D0510 Intraductal carcinoma in situ of unspecified breast: Secondary | ICD-10-CM

## 2013-11-24 DIAGNOSIS — D0590 Unspecified type of carcinoma in situ of unspecified breast: Secondary | ICD-10-CM

## 2013-11-24 NOTE — Progress Notes (Signed)
Subjective:     Patient ID: Laura Bell, female   DOB: 1936/06/13, 78 y.o.   MRN: [id number]  HPI The patient is a 78 year old white female who is 2 weeks status post left breast lumpectomy for DCIS. Her lateral marginal was 1 mm. The other margins were widely clean. She tolerated the surgery well. She denies any significant breast pain.  Review of Systems     Objective:   Physical Exam On exam her left breast incision is healing nicely with no sign of infection or significant seroma    Assessment:     The patient is 2 weeks status post left breast lumpectomy for DCIS     Plan:     At this point I will refer her to the medical and radiation oncologist for consideration of adjuvant treatment. She has one close margin and I will refer her to the radiation oncologist as to whether this could be adequately treated with radiation or does It need to be reexcised. I have also contacted the pathology Department to make sure that her tumor markers are performed. I will plan to see her back in one month to check her progress

## 2013-11-24 NOTE — Patient Instructions (Signed)
Will refer to medical and radiation oncology

## 2013-11-26 ENCOUNTER — Encounter: Payer: Self-pay | Admitting: *Deleted

## 2013-11-26 ENCOUNTER — Telehealth: Payer: Self-pay | Admitting: Oncology

## 2013-11-26 ENCOUNTER — Telehealth (INDEPENDENT_AMBULATORY_CARE_PROVIDER_SITE_OTHER): Payer: Self-pay

## 2013-11-26 ENCOUNTER — Other Ambulatory Visit (INDEPENDENT_AMBULATORY_CARE_PROVIDER_SITE_OTHER): Payer: Self-pay

## 2013-11-26 ENCOUNTER — Other Ambulatory Visit: Payer: Self-pay | Admitting: *Deleted

## 2013-11-26 ENCOUNTER — Encounter: Payer: Self-pay | Admitting: Oncology

## 2013-11-26 ENCOUNTER — Ambulatory Visit: Payer: Medicare Other

## 2013-11-26 ENCOUNTER — Ambulatory Visit (HOSPITAL_BASED_OUTPATIENT_CLINIC_OR_DEPARTMENT_OTHER): Payer: Medicare Other | Admitting: Oncology

## 2013-11-26 ENCOUNTER — Other Ambulatory Visit (HOSPITAL_BASED_OUTPATIENT_CLINIC_OR_DEPARTMENT_OTHER): Payer: Medicare Other

## 2013-11-26 VITALS — BP 136/79 | HR 102 | Temp 97.8°F | Resp 18 | Ht 62.0 in | Wt 149.6 lb

## 2013-11-26 DIAGNOSIS — D0590 Unspecified type of carcinoma in situ of unspecified breast: Secondary | ICD-10-CM

## 2013-11-26 DIAGNOSIS — E119 Type 2 diabetes mellitus without complications: Secondary | ICD-10-CM

## 2013-11-26 DIAGNOSIS — Z0000 Encounter for general adult medical examination without abnormal findings: Secondary | ICD-10-CM

## 2013-11-26 DIAGNOSIS — M545 Low back pain, unspecified: Secondary | ICD-10-CM

## 2013-11-26 DIAGNOSIS — J309 Allergic rhinitis, unspecified: Secondary | ICD-10-CM

## 2013-11-26 DIAGNOSIS — M1990 Unspecified osteoarthritis, unspecified site: Secondary | ICD-10-CM

## 2013-11-26 DIAGNOSIS — C50512 Malignant neoplasm of lower-outer quadrant of left female breast: Secondary | ICD-10-CM | POA: Insufficient documentation

## 2013-11-26 DIAGNOSIS — E782 Mixed hyperlipidemia: Secondary | ICD-10-CM

## 2013-11-26 DIAGNOSIS — Z170 Estrogen receptor positive status [ER+]: Secondary | ICD-10-CM

## 2013-11-26 DIAGNOSIS — D0510 Intraductal carcinoma in situ of unspecified breast: Secondary | ICD-10-CM

## 2013-11-26 DIAGNOSIS — I10 Essential (primary) hypertension: Secondary | ICD-10-CM

## 2013-11-26 LAB — COMPREHENSIVE METABOLIC PANEL (CC13)
ALT: 10 U/L (ref 0–55)
AST: 11 U/L (ref 5–34)
Albumin: 3.7 g/dL (ref 3.5–5.0)
Alkaline Phosphatase: 65 U/L (ref 40–150)
Anion Gap: 10 mEq/L (ref 3–11)
BUN: 9.4 mg/dL (ref 7.0–26.0)
CALCIUM: 10 mg/dL (ref 8.4–10.4)
CHLORIDE: 104 meq/L (ref 98–109)
CO2: 26 mEq/L (ref 22–29)
Creatinine: 0.7 mg/dL (ref 0.6–1.1)
Glucose: 110 mg/dl (ref 70–140)
Potassium: 4.2 mEq/L (ref 3.5–5.1)
Sodium: 140 mEq/L (ref 136–145)
Total Protein: 6.6 g/dL (ref 6.4–8.3)

## 2013-11-26 LAB — CBC WITH DIFFERENTIAL/PLATELET
BASO%: 1 % (ref 0.0–2.0)
Basophils Absolute: 0.1 10*3/uL (ref 0.0–0.1)
EOS%: 3.1 % (ref 0.0–7.0)
Eosinophils Absolute: 0.3 10*3/uL (ref 0.0–0.5)
HCT: 39.5 % (ref 34.8–46.6)
HGB: 12.9 g/dL (ref 11.6–15.9)
LYMPH#: 2 10*3/uL (ref 0.9–3.3)
LYMPH%: 24.1 % (ref 14.0–49.7)
MCH: 28.6 pg (ref 25.1–34.0)
MCHC: 32.7 g/dL (ref 31.5–36.0)
MCV: 87.6 fL (ref 79.5–101.0)
MONO#: 0.7 10*3/uL (ref 0.1–0.9)
MONO%: 8.5 % (ref 0.0–14.0)
NEUT#: 5.3 10*3/uL (ref 1.5–6.5)
NEUT%: 63.3 % (ref 38.4–76.8)
Platelets: 273 10*3/uL (ref 145–400)
RBC: 4.51 10*6/uL (ref 3.70–5.45)
RDW: 13.3 % (ref 11.2–14.5)
WBC: 8.4 10*3/uL (ref 3.9–10.3)

## 2013-11-26 MED ORDER — TAMOXIFEN CITRATE 20 MG PO TABS: 20.0000 mg | ORAL_TABLET | Freq: Every day | ORAL | Status: DC

## 2013-11-26 NOTE — Progress Notes (Signed)
Checked in new patient with no financial issues. She has appt card and [facility] packet- I gave to fill out. She has not been to

## 2013-11-26 NOTE — Telephone Encounter (Signed)
Order placed

## 2013-11-26 NOTE — Progress Notes (Signed)
Created chart, gave to [facility] to enter labs, added to spreadsheet & placed in Dr. Virgie Dad box.

## 2013-11-26 NOTE — Telephone Encounter (Signed)
Message copied by Carlene Coria on Fri Nov 26, 2013  1:11 PM ------      Message from: Tawanna Cooler      Created: Fri Nov 26, 2013 12:50 PM      Regarding: Referral       Hi Kathreen Cornfield in [place] does not have a referral for this pt.  Can you please put on in?            Thanks,      Lattie Haw             ------

## 2013-11-26 NOTE — Telephone Encounter (Signed)
, °
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
## 2013-11-29 ENCOUNTER — Encounter: Payer: Self-pay | Admitting: Family Medicine

## 2013-11-29 ENCOUNTER — Ambulatory Visit (INDEPENDENT_AMBULATORY_CARE_PROVIDER_SITE_OTHER): Payer: Medicare Other | Admitting: Family Medicine

## 2013-11-29 ENCOUNTER — Telehealth: Payer: Self-pay | Admitting: *Deleted

## 2013-11-29 VITALS — BP 138/78 | HR 96 | Temp 98.2°F | Ht 62.0 in | Wt 150.2 lb

## 2013-11-29 DIAGNOSIS — I10 Essential (primary) hypertension: Secondary | ICD-10-CM | POA: Diagnosis not present

## 2013-11-29 DIAGNOSIS — L259 Unspecified contact dermatitis, unspecified cause: Secondary | ICD-10-CM

## 2013-11-29 DIAGNOSIS — E119 Type 2 diabetes mellitus without complications: Secondary | ICD-10-CM | POA: Diagnosis not present

## 2013-11-29 DIAGNOSIS — R197 Diarrhea, unspecified: Secondary | ICD-10-CM | POA: Diagnosis not present

## 2013-11-29 DIAGNOSIS — L309 Dermatitis, unspecified: Secondary | ICD-10-CM

## 2013-11-29 MED ORDER — MOMETASONE FUROATE 0.1 % EX CREA: 1.0000 "application " | TOPICAL_CREAM | Freq: Every day | CUTANEOUS | Status: DC

## 2013-11-29 NOTE — Patient Instructions (Signed)
Use the elocon cream on rash- update me if no improvement  Watch your diet  Do the stool test for diarrhea  Follow up in 3 months with labs prior  I hope things will look better with regular exercise and schedule

## 2013-11-29 NOTE — Assessment & Plan Note (Signed)
Trial of elocon cream  Moisturizers Avoidance of hot water and detergents Update if not starting to improve in several  week or if worsening

## 2013-11-29 NOTE — Assessment & Plan Note (Signed)
BP: 138/78 mmHg  bp in fair control at this time  No changes needed Disc lifstyle change with low sodium diet and exercise   Labs reviewed

## 2013-11-29 NOTE — Telephone Encounter (Signed)
Santiago Glad from [employer] called stating referral received from Dr Marlou Starks for evaluation. When Santiago Glad spoke with the patient - patient informed Santiago Glad " Dr Jana Hakim said I didn't need radiation but I was going to take a pill ".  See MD dictation- per recommendation  PLAN: We spent the better part of today's hour-long appointment discussing the biology of breast cancer in general, and the specifics of the patient's tumor in particular. Laura Bell understands that noninvasive breast cancer cannot spread to vital organ, and therefore in itself this tumor is not life-threatening. There is some risk of the tumor recurring (in the same breast) and she can reduce this risk in one of 2 ways: With radiation therapy, or with antiestrogen therapy.  We then discussed the possible toxicities, side effects, complications, and time course of the possible treatments. The patient is very much against the idea of radiation, although she understands she could receive this in [place]. She is very agreeable to antiestrogen.  We then discussed the differences between tamoxifen and the aromatase inhibitors, and given the fact that the patient is status post hysterectomy and took hormone replacement for 1 year without any clotting complications, I think tamoxifen would be a better choice for her. The patient agrees, as she is very concerned about problems with bone density.  She understands that in addition to blood clots, vaginal wetness or dryness, and hot flashes, tamoxifen can accelerate cataracts. The patient was encouraged to continue to have arise evaluated on a yearly basis. She was reassured that tamoxifen does not cause weight gain, depression, or dementia.  Problem of course is that we do not yet have the prognostic panel and therefore I am not sure the patient will be able to receive tamoxifen. We should have that information by early next week.  Accordingly I wrote her prescription for tamoxifen for the patient, but her  daughter will call us next week to get the estrogen receptor results. If the tumor is positive, as I expect, she will start tamoxifen and see me again in 3 months. If the tumor is estrogen and progesterone receptor negative, we will refer her for radiation oncology.  I am checking with our genetics counselor whether the patient qualifies for genetic testing.  Laura Bell has a good understanding of the overall plan. She agrees with it. She knows a goal of treatment is cure. She will call with any problems that may develop before the next visit here.  This RN will follow up per above and contact Santiago Glad in [place] regarding need for radiation consultation.

## 2013-11-29 NOTE — Progress Notes (Signed)
Subjective:    Patient ID: Laura Bell, female    DOB: [birth year], 78 y.o.   MRN: [id number]  HPI Here for f/u of chronic medical problems   Has been feeling ok overall   Had her surgery for breast cancer- doing well overall with oncol f/u  Will be starting tamoxifen on wed   Has a rash on her hands and her lower legs - itches  Has been going on about 3 mo Waxes and wanes  She uses otc cortisone 10 and aveeno moisturizer   Wt is stable with bmi of 27  bp is stable today  No cp or palpitations or headaches or edema  No side effects to medicines  BP Readings from Last 3 Encounters:  11/29/13 138/78  11/26/13 136/79  11/24/13 146/84     Diabetes Home sugar results - about the same as last time (did go up on one day due to surgery)  DM diet - she is not eating like she should- (when she does eat she eats DM diet)- but does not eat regularly  occ cheats  Exercise - no exercise yet - will be able to begin soon as she recovers  Symptoms-none  A1C last  Lab Results  Component Value Date   HGBA1C 7.5* 11/22/2013  this is unchanged from prev  No problems with medications  Still has some diarrhea - without cramping or fever or appetite change Renal protection ace  Last eye exam 5/14   Results for orders placed in visit on 11/26/13  CBC WITH DIFFERENTIAL      Result Value Ref Range   WBC 8.4  3.9 - 10.3 10e3/uL   NEUT# 5.3  1.5 - 6.5 10e3/uL   HGB 12.9  11.6 - 15.9 g/dL   HCT 39.5  34.8 - 46.6 %   Platelets 273  145 - 400 10e3/uL   MCV 87.6  79.5 - 101.0 fL   MCH 28.6  25.1 - 34.0 pg   MCHC 32.7  31.5 - 36.0 g/dL   RBC 4.51  3.70 - 5.45 10e6/uL   RDW 13.3  11.2 - 14.5 %   lymph# 2.0  0.9 - 3.3 10e3/uL   MONO# 0.7  0.1 - 0.9 10e3/uL   Eosinophils Absolute 0.3  0.0 - 0.5 10e3/uL   Basophils Absolute 0.1  0.0 - 0.1 10e3/uL   NEUT% 63.3  38.4 - 76.8 %   LYMPH% 24.1  14.0 - 49.7 %   MONO% 8.5  0.0 - 14.0 %   EOS% 3.1  0.0 - 7.0 %   BASO% 1.0  0.0 - 2.0 %    COMPREHENSIVE METABOLIC PANEL (IH47)      Result Value Ref Range   Sodium 140  136 - 145 mEq/L   Potassium 4.2  3.5 - 5.1 mEq/L   Chloride 104  98 - 109 mEq/L   CO2 26  22 - 29 mEq/L   Glucose 110  70 - 140 mg/dl   BUN 9.4  7.0 - 26.0 mg/dL   Creatinine 0.7  0.6 - 1.1 mg/dL   Total Bilirubin <0.20  0.20 - 1.20 mg/dL   Alkaline Phosphatase 65  40 - 150 U/L   AST 11  5 - 34 U/L   ALT 10  0 - 55 U/L   Total Protein 6.6  6.4 - 8.3 g/dL   Albumin 3.7  3.5 - 5.0 g/dL   Calcium 10.0  8.4 - 10.4 mg/dL   Anion Gap 10  3 - 11 mEq/L        Review of Systems Review of Systems  Constitutional: Negative for fever, appetite change, fatigue and unexpected weight change.  Eyes: Negative for pain and visual disturbance.  Respiratory: Negative for cough and shortness of breath.   Cardiovascular: Negative for cp or palpitations    Gastrointestinal: Negative for nausea,  and constipation. neg for dark stool or blood in stool Genitourinary: Negative for urgency and frequency.  Skin: Negative for pallor and pos for itchy rash Neurological: Negative for weakness, light-headedness, numbness and headaches.  Hematological: Negative for adenopathy. Does not bruise/bleed easily.  Psychiatric/Behavioral: Negative for dysphoric mood. The patient is not nervous/anxious.         Objective:   Physical Exam  Constitutional: She appears well-developed and well-nourished. No distress.  HENT:  Head: Normocephalic and atraumatic.  Mouth/Throat: Oropharynx is clear and moist.  Eyes: Conjunctivae and EOM are normal. Pupils are equal, round, and reactive to light.  Neck: Normal range of motion. Neck supple. No JVD present. Carotid bruit is not present.  Cardiovascular: Normal rate, regular rhythm, normal heart sounds and intact distal pulses.  Exam reveals no gallop.   Pulmonary/Chest: Effort normal and breath sounds normal. No respiratory distress. She has no wheezes. She has no rales.  Abdominal: Soft.  Bowel sounds are normal. She exhibits no distension, no abdominal bruit and no mass. There is no tenderness. There is no rebound and no guarding.  Musculoskeletal: She exhibits no edema.  Lymphadenopathy:    She has no cervical adenopathy.  Neurological: She is alert. She has normal reflexes.  Skin: Skin is warm and dry. Rash noted. There is erythema. No pallor.  Mild patches of erythema on hands and lower legs consistent with eczema   Psychiatric: She has a normal mood and affect.          Assessment & Plan:

## 2013-11-29 NOTE — Progress Notes (Signed)
Pre visit review using our clinic review tool, if applicable. No additional management support is needed unless otherwise documented below in the visit note. 

## 2013-11-29 NOTE — Assessment & Plan Note (Signed)
Lab Results  Component Value Date   HGBA1C 7.5* 11/22/2013   Still not at goal  Pt thinks with diet/exericse and resuming regular activity she can now get this down She declines additional medication Rev low glycemic diet  F/u in 3 mo with lab prior

## 2013-11-29 NOTE — Assessment & Plan Note (Signed)
Check c diff today in light of recent hospital tx for breast ca  Will update if fever or worse symptoms

## 2013-11-30 ENCOUNTER — Telehealth: Payer: Self-pay | Admitting: Family Medicine

## 2013-11-30 NOTE — Telephone Encounter (Signed)
Relevant patient education mailed to patient.  

## 2013-12-01 ENCOUNTER — Telehealth: Payer: Self-pay

## 2013-12-01 ENCOUNTER — Telehealth: Payer: Self-pay | Admitting: *Deleted

## 2013-12-01 NOTE — Telephone Encounter (Signed)
Relevant patient education mailed to patient.  

## 2013-12-01 NOTE — Telephone Encounter (Signed)
Patient daughter called to determine if patient needs to start on Tamoxifen or if she truly needs RT. Spoke with Dr Nicoletta Dress and results of Breast Prog Panel are to be reviewed by Dr Lyndon Code as well for confirmation. Spoke with Bary Castilla, Breast Coordinator, she is aware of current situation and will also be looking for final results. Left message for Arbie Cookey that we would be in touch with final decision in next couple days.

## 2013-12-02 ENCOUNTER — Ambulatory Visit: Payer: Medicare Other | Admitting: Radiation Oncology

## 2013-12-02 ENCOUNTER — Ambulatory Visit: Payer: Medicare Other

## 2013-12-02 DIAGNOSIS — R197 Diarrhea, unspecified: Secondary | ICD-10-CM | POA: Diagnosis not present

## 2013-12-02 NOTE — Addendum Note (Signed)
Addended by: Marchia Bond on: 12/02/2013 02:35 PM   Modules accepted: Orders

## 2013-12-03 ENCOUNTER — Telehealth: Payer: Self-pay | Admitting: *Deleted

## 2013-12-03 LAB — CLOSTRIDIUM DIFFICILE EIA: CDIFTX: NEGATIVE

## 2013-12-03 NOTE — Telephone Encounter (Signed)
Spoke to daughter Arbie Cookey about mother's prognostic panel.  Gave results of ER/PR positivity.  Informed pt daughter to instruct mother to start taking Tamoxifen as instructed by Dr. Jana Hakim.  Gave instructions on how to take.  Received verbal understanding.  Encourage daughter to call with any needs or questions.   Informed physician team of results.

## 2013-12-09 ENCOUNTER — Telehealth: Payer: Self-pay

## 2013-12-09 NOTE — Telephone Encounter (Signed)
Pt was told on 12/06/13 to stop metformin and cb in 2-3 days with report on diarrhea. Pt said stopped metformin on 12/06/13 and has had no more diarrhea. Pt request cb with further instructions. [place].

## 2013-12-10 NOTE — Telephone Encounter (Signed)
She was on 1000 mg bid- over the weekend cut dose to 500 and take 500 mg bid - let me know how that goes - whether she still has intolerable diarrhea with less-thanks

## 2013-12-10 NOTE — Telephone Encounter (Signed)
Pt notified to take 1/2 tab (500mg ) BID over weekend and let us know how that goes, pt verbalized understanding

## 2013-12-13 ENCOUNTER — Encounter (INDEPENDENT_AMBULATORY_CARE_PROVIDER_SITE_OTHER): Payer: Self-pay | Admitting: General Surgery

## 2013-12-13 ENCOUNTER — Ambulatory Visit (INDEPENDENT_AMBULATORY_CARE_PROVIDER_SITE_OTHER): Payer: Medicare Other | Admitting: General Surgery

## 2013-12-13 VITALS — BP 118/70 | HR 74 | Temp 97.5°F | Resp 14 | Ht 62.0 in | Wt 147.0 lb

## 2013-12-13 DIAGNOSIS — D0590 Unspecified type of carcinoma in situ of unspecified breast: Secondary | ICD-10-CM

## 2013-12-13 DIAGNOSIS — D0510 Intraductal carcinoma in situ of unspecified breast: Secondary | ICD-10-CM | POA: Insufficient documentation

## 2013-12-13 NOTE — Patient Instructions (Signed)
Will refer to rad onc

## 2013-12-20 ENCOUNTER — Telehealth: Payer: Self-pay

## 2013-12-20 ENCOUNTER — Other Ambulatory Visit: Payer: Self-pay

## 2013-12-20 MED ORDER — ACCU-CHEK SOFTCLIX LANCETS MISC: Status: DC

## 2013-12-20 NOTE — Telephone Encounter (Signed)
[facility] said pt has been paying out of pocket for lancets and pt can no longer afford; request rx sent for lancets; advised done.

## 2013-12-22 ENCOUNTER — Encounter: Payer: Self-pay | Admitting: Radiation Oncology

## 2013-12-22 NOTE — Progress Notes (Signed)
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 Breast, lumpectomy, Left- DUCTAL CARCINOMA IN SITU SEE COMMENT.- IN SITU CARCINOMA IS 3MM FROM NEAREST MARGIN (ANTERIOR). - LOBULAR NEOPLASIA (ATYPICAL HYPERPLASIA AND IN SITU CARCINOMA).- SEE TUMOR SYNOPTIC TEMPLATE BELOW.2. Breast, excision, Left additional superior medial margin1 of 4FINAL for [name],Laura([id number])Diagnosis(continued)- DUCTAL CARCINOMA IN SITU SEE COMMENT.- IN SITU CARCINOMA IS 1MM FROM NEAREST MARGIN (MEDIAL)- LOBULAR NEOPLASIA (IN SITU CARCINOMA AND ATYPICAL HYPERPLASIA).3. Breast, excision, Left additional superior lateral margin- BENIGN BREAST TISSUE, SEE COMMENT.- NEGATIVE FOR ATYPIA OR MALIGNANCY.- CALCIFICATIONS IDENTIFIED.- SURGICAL MARGIN, NEGATIVE FOR ATYPIA OR MALIGNANCY.Microscopic Comment1. BREAST, IN SITU CARCINOMASpecimen, including laterality: Left breast without lymph node sampling.Procedure (include lymph node sampling sentinel-non-sentinel LumpectomyGrade of carcinoma: II of III, Dr. Marlou Starks    Receptor Status: ER( +  ), PR (  + ), Her2-neu   Did patient present with symptoms (if so, please note symptoms) or was this found on screening mammography?: routine screening  At [facility]14[facility]13,  On ,09/29/13 ,mammography and ultrasound , left breast  Past/Anticipated interventions by surgeon, if GLO:VFIE Lumpectomy 27-Nov-2013 Dr. Marlou Starks, f/u post op appt.,11/24/13, follow up again in 1 month  Past/Anticipated interventions by medical oncology, if any: Tamoxifen started on 12/03/2013 and follow up with Dr.Magrinat on 02/23/14.  Lymphedema issues, if any:No    Pain issues, if any:No   SAFETY ISSUES:  Prior radiation?NO  Pacemaker/ICD? NO  Possible  current pregnancy? No  Is the patient on methotrexate? No  Current Complaints / other details: Divorced, Retired,   Abdominal hysterectomy (partial) in 11/27/1962, cervical cancer , bladder surgery 1993-11-27, father deceased lung cancer age 88,smoker, mother deceased MI age 61, 75 brother kidney cancer, and 2nd brother lung cancer, 1 sister lung cancer, maternal niece breast cancer, dx age 64,  Menarche age 70, first full term birth age 27 [id number], took premarin 6 months to  1 year , former smoker, quit 11/04/1995, no alcohol or illicit drug use, anxiety/depression    [name], Laura Gage, RN 12/22/2013,10:53 AM

## 2013-12-23 ENCOUNTER — Ambulatory Visit
Admission: RE | Admit: 2013-12-23 | Discharge: 2013-12-23 | Disposition: A | Discharge status: Home / Self Care | Payer: Medicare Other | Source: Ambulatory Visit | Attending: Radiation Oncology | Admitting: Radiation Oncology

## 2013-12-23 ENCOUNTER — Encounter: Payer: Self-pay | Admitting: Radiation Oncology

## 2013-12-23 VITALS — BP 145/77 | HR 93 | Temp 98.5°F | Wt 148.9 lb

## 2013-12-23 DIAGNOSIS — K219 Gastro-esophageal reflux disease without esophagitis: Secondary | ICD-10-CM | POA: Diagnosis not present

## 2013-12-23 DIAGNOSIS — Z79899 Other long term (current) drug therapy: Secondary | ICD-10-CM | POA: Diagnosis not present

## 2013-12-23 DIAGNOSIS — Z803 Family history of malignant neoplasm of breast: Secondary | ICD-10-CM | POA: Insufficient documentation

## 2013-12-23 DIAGNOSIS — I10 Essential (primary) hypertension: Secondary | ICD-10-CM | POA: Diagnosis not present

## 2013-12-23 DIAGNOSIS — Z8541 Personal history of malignant neoplasm of cervix uteri: Secondary | ICD-10-CM | POA: Diagnosis not present

## 2013-12-23 DIAGNOSIS — Z87891 Personal history of nicotine dependence: Secondary | ICD-10-CM | POA: Insufficient documentation

## 2013-12-23 DIAGNOSIS — C50512 Malignant neoplasm of lower-outer quadrant of left female breast: Secondary | ICD-10-CM

## 2013-12-23 DIAGNOSIS — C50519 Malignant neoplasm of lower-outer quadrant of unspecified female breast: Secondary | ICD-10-CM | POA: Insufficient documentation

## 2013-12-23 DIAGNOSIS — Z90710 Acquired absence of both cervix and uterus: Secondary | ICD-10-CM | POA: Insufficient documentation

## 2013-12-23 DIAGNOSIS — E119 Type 2 diabetes mellitus without complications: Secondary | ICD-10-CM | POA: Insufficient documentation

## 2013-12-23 DIAGNOSIS — Z170 Estrogen receptor positive status [ER+]: Secondary | ICD-10-CM | POA: Diagnosis not present

## 2013-12-23 HISTORY — DX: Malignant neoplasm of unspecified site of unspecified female breast: C50.919

## 2013-12-23 HISTORY — DX: Malignant neoplasm of cervix uteri, unspecified: C53.9

## 2013-12-23 NOTE — Progress Notes (Signed)
Please see the Nurse Progress Note in the MD Initial Consult Encounter for this patient. 

## 2013-12-23 NOTE — Progress Notes (Addendum)
Radiation Oncology         [phone] ________________________________  Initial outpatient Consultation - Date: 12/23/2013   Name: Laura Bell MRN: [id number]   DOB: [birth year]  REFERRING PHYSICIAN: Merrie Roof, MD  STAGE: Breast cancer of lower-outer quadrant of left female breast   Primary site: Breast   Staging method: AJCC 7th Edition   Clinical: Stage Unknown (Tis, NX, cM0) signed by Chauncey Cruel, MD on 11/26/2013  3:27 PM   Summary: Stage Unknown (Tis, NX, cM0)  HISTORY OF PRESENT ILLNESS::Laura Bell is a 78 y.o. female  who presented for screening mammogram. This was performed in [place] on December 16. She had a stereotactic biopsy of calcifications in the lower outer left breast. This showed DCIS which was ER/PR positive. She elected for lumpectomy which she had performed on 11/10/2013. She had ductal carcinoma in situ which measured 6 mm and had negative margins. Associated atypical lobular hyperplasia and lobular carcinoma in situ was noted. Her tumor was ER positive at 100% PR positive at 60%. She met with Dr. Jana Hakim and is taking tamoxifen. She is tolerating that well. She is accompanied by her daughter who is a Marine scientist at the [employer] today. She was referred for consideration of radiation in the management of her disease. She is GX P5 with her first full-term birth of 73. She took Premarin for a year and had a hysterectomy in 1964. She had several family members with lung cancer a brother with kidney cancer and a niece with breast cancer. She is a former smoker but quit 20 years ago.  PREVIOUS RADIATION THERAPY: No  PAST MEDICAL HISTORY:  has a past medical history of Diabetes mellitus; GERD (gastroesophageal reflux disease); Hypertension; Osteopenia; Urinary incontinence; Varicose veins; Stress reaction (2009); Full dentures; Breast cancer (10/11/13 dx); Cervical cancer; and Osteoarthritis.    PAST SURGICAL HISTORY: Past Surgical  History  Procedure Laterality Date  . Abdominal hysterectomy  1964    partial, cervical cancer  . Bladder surgery  1995  . Abdominal US  2007    Negative  . Sclerotherapy varicose veins  5/08  . Left breast bx  1/15    benign  . Breast lumpectomy with needle localization Left 11/10/2013    Procedure: BREAST LUMPECTOMY WITH NEEDLE LOCALIZATION;  Surgeon: Merrie Roof, MD;  Location: [facility];  Service: General;  Laterality: Left;    FAMILY HISTORY:  Family History  Problem Relation Age of Onset  . Heart attack Mother   . Cancer Father     lung ca  . Cancer Sister     lung  . Cancer Brother     kidney  . Cancer Brother     lung  . Cancer Other 46    breast    SOCIAL HISTORY:  History  Substance Use Topics  . Smoking status: Former Smoker -- 0.70 packs/day for 56 years    Types: Cigarettes    Quit date: 11/04/1995  . Smokeless tobacco: Never Used  . Alcohol Use: No    ALLERGIES: Alendronate sodium; Boniva; Lansoprazole; and Latex  MEDICATIONS:  Current Outpatient Prescriptions  Medication Sig Dispense Refill  . ACCU-CHEK SOFTCLIX LANCETS lancets Ck blood sugars twice a day and as directed. Dx 250.00.  100 each  5  . acetaminophen (TYLENOL) 500 MG tablet Take 500 mg by mouth every 6 (six) hours as needed.        Marland Kitchen aspirin 81 MG tablet Take 81  mg by mouth daily.        . Cholecalciferol (VITAMIN D) 2000 UNITS CAPS Take by mouth daily.      Marland Kitchen glucose blood test strip To check sugar twice daily and as needed for diabetes 250.0 that is not controlled  100 each  12  . glyBURIDE (DIABETA) 5 MG tablet Take 1 tablet (5 mg total) by mouth 2 (two) times daily with a meal.  180 tablet  3  . lisinopril (PRINIVIL,ZESTRIL) 40 MG tablet Take 1 tablet (40 mg total) by mouth daily.  90 tablet  1  . metFORMIN (GLUCOPHAGE) 1000 MG tablet Take 500 mg by mouth 2 (two) times daily with a meal.      . mometasone (ELOCON) 0.1 % cream Apply 1 application topically daily. To  affected areas  45 g  0  . omeprazole (PRILOSEC) 20 MG capsule Take 1 capsule (20 mg total) by mouth daily.  90 capsule  3  . simvastatin (ZOCOR) 20 MG tablet Take 1 tablet (20 mg total) by mouth at bedtime. Take 1 tablet by mouth each evening with a low fat snack.  90 tablet  1  . tamoxifen (NOLVADEX) 20 MG tablet Take 1 tablet (20 mg total) by mouth daily.  90 tablet  12  . oxyCODONE-acetaminophen (ROXICET) 5-325 MG per tablet Take 1-2 tablets by mouth every 4 (four) hours as needed for severe pain.  50 tablet  0   No current facility-administered medications for this encounter.    REVIEW OF SYSTEMS:  A 15 point review of systems is documented in the electronic medical record. This was obtained by the nursing staff. However, I reviewed this with the patient to discuss relevant findings and make appropriate changes.  Pertinent items are noted in HPI.  PHYSICAL EXAM:  Filed Vitals:   12/23/13 1012  BP: 145/77  Pulse: 93  Temp: 98.5 F (36.9 C)  .148 lb 14.4 oz (67.541 kg). Pleasant elderly female in no distress sitting comfortably examining table. She is alert minus x3. She has a healing left lumpectomy incision which appears clean dry and intact with no signs of infection. She is alert and oriented x3. She is appropriate mood affect and in situ.  LABORATORY DATA:  Lab Results  Component Value Date   WBC 8.4 11/26/2013   HGB 12.9 11/26/2013   HCT 39.5 11/26/2013   MCV 87.6 11/26/2013   PLT 273 11/26/2013   Lab Results  Component Value Date   NA 140 11/26/2013   K 4.2 11/26/2013   CL 100 11/08/2013   CO2 26 11/26/2013   Lab Results  Component Value Date   ALT 10 11/26/2013   AST 11 11/26/2013   ALKPHOS 65 11/26/2013   BILITOT <0.20 11/26/2013     RADIOGRAPHY: No results found.    IMPRESSION:  DCIS of the left breast status post lumpectomy  PLAN: I spoke to the patient and her daughter today. We discussed the role of radiation and decreasing local failures in patients who undergo  lumpectomy. We discussed the absence of randomized data showing the equivalence in terms of local recurrence for patients who undergo antiestrogen therapy versus radiation therapy. We discussed that there was no survival benefit to radiation only a local control advantage if she did live long enough to experience local recurrence. We discussed the process of simulation the placement tattoos. We discussed 4 weeks of treatment as an outpatient. We discussed skin redness as possible side effects. At this point she is  tolerating her tamoxifen well and feels like this is a good choice for her. She is going to followup with medical oncology and surgery. Her followup with me will be on an as-needed basis.  I spent 40 minutes  face to face with the patient and more than 50% of that time was spent in counseling and/or coordination of care.   ------------------------------------------------  Thea Silversmith, MD

## 2013-12-23 NOTE — Addendum Note (Signed)
Encounter addended by: Arlyss Repress, RN on: 12/23/2013  3:13 PM<BR>     Documentation filed: Charges VN

## 2014-01-14 ENCOUNTER — Telehealth: Payer: Self-pay | Admitting: Family Medicine

## 2014-01-14 MED ORDER — GLYBURIDE 5 MG PO TABS: ORAL_TABLET | ORAL | Status: DC

## 2014-01-14 NOTE — Telephone Encounter (Signed)
Left voicemail requesting pt to call office 

## 2014-01-14 NOTE — Telephone Encounter (Signed)
Pt calling back to inform you of how medication is doing since reducing meds.  No diarrhea, but blood sugar is doing good at night but up in the mornings. Running around 157 at night and around 180 in the mornings. Pt does not eat anything after she takes it at night and before she takes it in the morning.

## 2014-01-14 NOTE — Telephone Encounter (Signed)
Thanks for the update- I'm glad she does not have diarrhea Continue the lower dose of metformin  I would like to increase her evening dose of glyburide from 5 to 10 mg to see if this helps the am sugars    (watch closely for hypoglycemia however)  Keep f/u appt as planned  I will change her px - please call in , thanks

## 2014-01-17 MED ORDER — GLYBURIDE 5 MG PO TABS: ORAL_TABLET | ORAL | Status: DC

## 2014-01-17 NOTE — Telephone Encounter (Signed)
Pt notified of Dr. Marliss Coots comments/recommendations. Rx sent to pharmacy and pt advise to keep f/u appt

## 2014-01-20 ENCOUNTER — Telehealth: Payer: Self-pay

## 2014-01-20 NOTE — Telephone Encounter (Signed)
Opened in error

## 2014-01-20 NOTE — Telephone Encounter (Signed)
Brook with [facility] left v/m requesting cb to clarify directions for glyburide 5 mg sent to pharmacy on 01/17/14.

## 2014-01-20 NOTE — Telephone Encounter (Signed)
Brook wanted to make sure pt was suppose to be taking med 1 tab in am and 2 tabs in pm. I advise Brook that was correct because pt was taking Rx 1 tab BID

## 2014-01-25 ENCOUNTER — Telehealth: Payer: Self-pay

## 2014-01-25 NOTE — Telephone Encounter (Signed)
Received fax letter saying PA approved, no additional info needed. Letter faxed to pt's pharmacy and placed in Dr. Marliss Coots inbox for signing

## 2014-01-25 NOTE — Telephone Encounter (Signed)
PA has been completed on [email or web address], I am just waiting to hear back from pt's insurance

## 2014-01-25 NOTE — Telephone Encounter (Signed)
Received form asking for additional info, from placed in your inbox

## 2014-01-25 NOTE — Telephone Encounter (Signed)
Carol left v/m requesting cb to pt when prior auth for glyburide is completed. Spoke with Tripp at [employer] and he will fax PA request for Glyburide to [phone]. Arbie Cookey also wanted rx sent for lancets to [facility]; that has been done and Tripp said ins will not cover lancets.

## 2014-01-25 NOTE — Telephone Encounter (Deleted)
See prev note, Prior Auth forms placed in your inbox

## 2014-02-02 NOTE — Progress Notes (Signed)
Subjective:     Patient ID: Laura Bell, female   DOB: [birth year], 78 y.o.   MRN: [id number]  HPI The patient is a 78 year old female who is about 2 months status post left breast lumpectomy for DCIS. She tolerated the surgery well. She has no complaints today.  Review of Systems     Objective:   Physical Exam On exam her left breast incision is healing nicely with no sign of infection or significant seroma    Assessment:     The patient is 2 months status post left breast lumpectomy for DCIS     Plan:     At this point I will refer her to medical and radiation oncology. She will continue to do regular self exams. I will plan to see her back in about 3 months

## 2014-02-22 ENCOUNTER — Other Ambulatory Visit (INDEPENDENT_AMBULATORY_CARE_PROVIDER_SITE_OTHER): Payer: Medicare Other

## 2014-02-22 DIAGNOSIS — E119 Type 2 diabetes mellitus without complications: Secondary | ICD-10-CM

## 2014-02-22 LAB — HEMOGLOBIN A1C: Hgb A1c MFr Bld: 8.4 % — ABNORMAL HIGH (ref 4.6–6.5)

## 2014-02-23 ENCOUNTER — Ambulatory Visit (HOSPITAL_BASED_OUTPATIENT_CLINIC_OR_DEPARTMENT_OTHER): Payer: Medicare Other | Admitting: Oncology

## 2014-02-23 ENCOUNTER — Telehealth: Payer: Self-pay | Admitting: Oncology

## 2014-02-23 VITALS — BP 156/95 | HR 94 | Temp 97.9°F | Resp 20 | Ht 62.0 in | Wt 148.6 lb

## 2014-02-23 DIAGNOSIS — E119 Type 2 diabetes mellitus without complications: Secondary | ICD-10-CM

## 2014-02-23 DIAGNOSIS — Z170 Estrogen receptor positive status [ER+]: Secondary | ICD-10-CM | POA: Diagnosis not present

## 2014-02-23 DIAGNOSIS — M810 Age-related osteoporosis without current pathological fracture: Secondary | ICD-10-CM | POA: Diagnosis not present

## 2014-02-23 DIAGNOSIS — D0590 Unspecified type of carcinoma in situ of unspecified breast: Secondary | ICD-10-CM | POA: Diagnosis not present

## 2014-02-23 DIAGNOSIS — C50512 Malignant neoplasm of lower-outer quadrant of left female breast: Secondary | ICD-10-CM

## 2014-02-23 DIAGNOSIS — M549 Dorsalgia, unspecified: Secondary | ICD-10-CM
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
## 2014-02-23 NOTE — Telephone Encounter (Signed)
per pof sch pt appt-gave pt copy of sch °

## 2014-02-28 ENCOUNTER — Ambulatory Visit (INDEPENDENT_AMBULATORY_CARE_PROVIDER_SITE_OTHER): Payer: Medicare Other | Admitting: Family Medicine

## 2014-02-28 ENCOUNTER — Encounter: Payer: Self-pay | Admitting: Family Medicine

## 2014-02-28 VITALS — BP 142/86 | HR 95 | Temp 98.3°F | Ht 62.0 in | Wt 146.8 lb

## 2014-02-28 DIAGNOSIS — R350 Frequency of micturition: Secondary | ICD-10-CM | POA: Diagnosis not present

## 2014-02-28 DIAGNOSIS — E119 Type 2 diabetes mellitus without complications: Secondary | ICD-10-CM | POA: Diagnosis not present

## 2014-02-28 DIAGNOSIS — I10 Essential (primary) hypertension: Secondary | ICD-10-CM | POA: Diagnosis not present

## 2014-02-28 LAB — POCT URINALYSIS DIPSTICK
BILIRUBIN UA: NEGATIVE
Blood, UA: NEGATIVE
Glucose, UA: 1000
KETONES UA: NEGATIVE
LEUKOCYTES UA: NEGATIVE
Nitrite, UA: NEGATIVE
Protein, UA: NEGATIVE
Spec Grav, UA: 1.02
Urobilinogen, UA: 0.2
pH, UA: 6

## 2014-02-28 MED ORDER — METFORMIN HCL 1000 MG PO TABS: ORAL_TABLET | ORAL | Status: DC

## 2014-02-28 NOTE — Patient Instructions (Signed)
Your blood pressure is up today because you did not take your pill yet -for your next visit make sure to take it first  Keep up the good habits  Increase metfomrin to 1/2 pill in am and 1 pill in pm - if diarrhea comes back let me know  Leave a urine specimen on the way out  Follow up in 3 months with labs prior  Take care of yourself

## 2014-02-28 NOTE — Progress Notes (Signed)
Pre visit review using our clinic review tool, if applicable. No additional management support is needed unless otherwise documented below in the visit note. 

## 2014-02-28 NOTE — Assessment & Plan Note (Signed)
Pt wants to try inc metformin in the pm to 1000 and staying with 500 in the am  Continue same glyburide  Stress may worsen am glucose readings Lab and f/u 3 mo  Doing well with lifestyle change despite the breast ca and other stressors  If no imp consider adding medication - adv pt to look into her coverage for other DM meds

## 2014-02-28 NOTE — Progress Notes (Signed)
   Subjective:    Patient ID: Laura Bell, female    DOB: Sep 16, 1936, 78 y.o.   MRN: [id number]  HPI Here for f/u of chronic medical problems   Wt is down 2 lb with bmi of 26  Had a good oncology report - doing well with breast cancer   bp is stable today  No cp or palpitations or headaches or edema  No side effects to medicines  BP Readings from Last 3 Encounters:  02/28/14 142/86  02/23/14 156/95  12/23/13 145/77      Diabetes Home sugar results - she notes that her sugar is down at night and up in the am (200 for instance) DM diet - she is eating low sugar and lots of salads  Exercise walks every day - doing well  Symptoms- she is more thirsty and she urinates more  A1C last  Lab Results  Component Value Date   HGBA1C 8.4* 02/22/2014    No problems with medications - but had diarrhea with metformin - she wants to try a higher dose at night  Renal protection- on ace  Last eye exam - has one planned tomorrow    She is very stressed and anxious and hopes things will calm down  Just got through breast ca  Has a grand daughter who is drug addicted  Gets out and socializes    Review of Systems Review of Systems  Constitutional: Negative for fever, appetite change,  and unexpected weight change.  Eyes: Negative for pain and visual disturbance.  Respiratory: Negative for cough and shortness of breath.   Cardiovascular: Negative for cp or palpitations    Gastrointestinal: Negative for nausea, diarrhea and constipation.  Genitourinary: pos  for urgency and frequency. pos for occas dysuria  Skin: Negative for pallor or rash   Neurological: Negative for weakness, light-headedness, numbness and headaches.  Hematological: Negative for adenopathy. Does not bruise/bleed easily.  Psychiatric/Behavioral: Negative for dysphoric mood. The patient is nervous/anxious. Pos for stressors         Objective:   Physical Exam  Constitutional: She appears well-developed and  well-nourished. No distress.  HENT:  Head: Normocephalic and atraumatic.  Eyes: Conjunctivae and EOM are normal. Pupils are equal, round, and reactive to light. No scleral icterus.  Neck: Normal range of motion. Neck supple. No JVD present. Carotid bruit is not present. No thyromegaly present.  Cardiovascular: Normal rate and regular rhythm.   Pulmonary/Chest: Effort normal and breath sounds normal. No respiratory distress. She has no wheezes. She has no rales.  Musculoskeletal: She exhibits no edema.  Lymphadenopathy:    She has no cervical adenopathy.  Neurological: She is alert. She has normal reflexes. No cranial nerve deficit. She exhibits normal muscle tone. Coordination normal.  Skin: Skin is warm and dry. No rash noted. No erythema. No pallor.  Psychiatric: She has a normal mood and affect.  Pt seems generally fatigued and stressed - but very positive attitude           Assessment & Plan:

## 2014-02-28 NOTE — Assessment & Plan Note (Signed)
bp is up today  Pt has not taken med yet-forgot this am  Will re check with better compliance next time

## 2014-03-01 DIAGNOSIS — H2510 Age-related nuclear cataract, unspecified eye: Secondary | ICD-10-CM | POA: Diagnosis not present

## 2014-03-02 NOTE — Assessment & Plan Note (Addendum)
ua today Suspect from high glucose levels No s/s/ of infection

## 2014-03-07 ENCOUNTER — Telehealth: Payer: Self-pay

## 2014-03-07 NOTE — Telephone Encounter (Signed)
I will see her then  

## 2014-03-07 NOTE — Telephone Encounter (Signed)
Pt having difficulty holding her urine; pt has urgency which has worsened since picked up 24 bottles of water; pt thinks her bladder has dropped and pt thinks she can feel something in perineal area. Pt has to find transportation to office and pt scheduled appt with Dr Glori Bickers on 03/08/14 at 12:15 PM.advised pt if condition changes or worsens prior to appt pt will cb.

## 2014-03-08 ENCOUNTER — Telehealth: Payer: Self-pay

## 2014-03-08 ENCOUNTER — Other Ambulatory Visit (HOSPITAL_COMMUNITY)
Admission: RE | Admit: 2014-03-08 | Discharge: 2014-03-08 | Disposition: A | Discharge status: Home / Self Care | Payer: Medicare Other | Source: Ambulatory Visit | Attending: Family Medicine | Admitting: Family Medicine

## 2014-03-08 ENCOUNTER — Encounter: Payer: Self-pay | Admitting: Family Medicine

## 2014-03-08 ENCOUNTER — Ambulatory Visit (INDEPENDENT_AMBULATORY_CARE_PROVIDER_SITE_OTHER): Payer: Medicare Other | Admitting: Family Medicine

## 2014-03-08 VITALS — BP 140/70 | HR 108 | Temp 97.9°F | Ht 62.0 in | Wt 147.5 lb

## 2014-03-08 DIAGNOSIS — Z124 Encounter for screening for malignant neoplasm of cervix: Secondary | ICD-10-CM | POA: Diagnosis not present

## 2014-03-08 DIAGNOSIS — Z8541 Personal history of malignant neoplasm of cervix uteri: Secondary | ICD-10-CM | POA: Insufficient documentation

## 2014-03-08 DIAGNOSIS — Z01419 Encounter for gynecological examination (general) (routine) without abnormal findings: Secondary | ICD-10-CM | POA: Insufficient documentation

## 2014-03-08 DIAGNOSIS — Z1151 Encounter for screening for human papillomavirus (HPV): Secondary | ICD-10-CM | POA: Insufficient documentation

## 2014-03-08 DIAGNOSIS — R350 Frequency of micturition: Secondary | ICD-10-CM

## 2014-03-08 DIAGNOSIS — R3915 Urgency of urination: Secondary | ICD-10-CM | POA: Diagnosis not present

## 2014-03-08 DIAGNOSIS — R32 Unspecified urinary incontinence: Secondary | ICD-10-CM

## 2014-03-08 LAB — POCT URINALYSIS DIPSTICK
Bilirubin, UA: NEGATIVE
Blood, UA: NEGATIVE
Ketones, UA: NEGATIVE
Leukocytes, UA: NEGATIVE
NITRITE UA: NEGATIVE
Protein, UA: NEGATIVE
Spec Grav, UA: <=1.005
Urobilinogen, UA: 0.2
pH, UA: 6

## 2014-03-08 MED ORDER — TOLTERODINE TARTRATE ER 4 MG PO CP24: 4.0000 mg | ORAL_CAPSULE | Freq: Every day | ORAL | Status: DC

## 2014-03-08 MED ORDER — TOLTERODINE TARTRATE 2 MG PO TABS: 2.0000 mg | ORAL_TABLET | Freq: Two times a day (BID) | ORAL | Status: DC

## 2014-03-08 NOTE — Patient Instructions (Signed)
For urge incontinence - do try to keep sugar controlled (this reduces the volume of urine)  Try detrol LA generic one pill daily - it may give you dry mouth/ constipation- watch out for that  I think you have some bladder and rectal prolapse - we will send you to gyn if needed  Let me know if your symptoms do not improve

## 2014-03-08 NOTE — Assessment & Plan Note (Signed)
Suspect overactive bladder See assessment for incontinence

## 2014-03-08 NOTE — Telephone Encounter (Signed)
That is fine with me -please send it in

## 2014-03-08 NOTE — Assessment & Plan Note (Signed)
I suspect mixed - with stress/ urge and overflow(from glucosuria) Cystocele and rectocele noted on pelvic exam as well  Pt would like to tx non surg if poss Given px for generic detrol LA with side eff discussion  Disc kegel exercises PT may be an opt May eventually need gyn consult - has had bladder tack already in past

## 2014-03-08 NOTE — Progress Notes (Signed)
Pre visit review using our clinic review tool, if applicable. No additional management support is needed unless otherwise documented below in the visit note. 

## 2014-03-08 NOTE — Assessment & Plan Note (Signed)
In 1964 with hysterectomy/partial Cervix of vag cuff done today-area appeared unremarkable

## 2014-03-08 NOTE — Telephone Encounter (Signed)
Brook from [employer] called; pt was seen earlier today and Tolterodine 4 mg 24 hr caps were sent to pharmacy. pts ins will not cover the capsule and pt wants to go ahead and get started on med. Brook wondered if Tolterodine 2 mg tabs taking 1 tab twice a day would be OK substitute for Dr Glori Bickers [facility] thinks ins will approve the tab because the LA or 24 hr cap is why ins not approving). Brook request cb.

## 2014-03-08 NOTE — Telephone Encounter (Signed)
Rx sent and pharmacy notified of the change
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
## 2014-03-10 LAB — CYTOLOGY - PAP

## 2014-03-11 ENCOUNTER — Encounter (INDEPENDENT_AMBULATORY_CARE_PROVIDER_SITE_OTHER): Payer: Medicare Other | Admitting: General Surgery

## 2014-03-25 ENCOUNTER — Encounter (INDEPENDENT_AMBULATORY_CARE_PROVIDER_SITE_OTHER): Payer: Self-pay | Admitting: General Surgery

## 2014-03-25 ENCOUNTER — Ambulatory Visit (INDEPENDENT_AMBULATORY_CARE_PROVIDER_SITE_OTHER): Payer: Medicare Other | Admitting: General Surgery

## 2014-03-25 VITALS — BP 129/82 | HR 76 | Temp 97.5°F | Resp 18 | Ht 62.0 in | Wt 146.6 lb

## 2014-03-25 DIAGNOSIS — D0512 Intraductal carcinoma in situ of left breast: Secondary | ICD-10-CM

## 2014-03-25 DIAGNOSIS — D0590 Unspecified type of carcinoma in situ of unspecified breast: Secondary | ICD-10-CM

## 2014-03-25 NOTE — Patient Instructions (Signed)
Continue tamoxifen Continue regular self exams

## 2014-03-28 ENCOUNTER — Other Ambulatory Visit: Payer: Self-pay | Admitting: Family Medicine

## 2014-04-08 DIAGNOSIS — H2510 Age-related nuclear cataract, unspecified eye: Secondary | ICD-10-CM | POA: Diagnosis not present

## 2014-04-13 LAB — HM DIABETES EYE EXAM

## 2014-04-25 NOTE — Progress Notes (Signed)
Subjective:     Patient ID: Laura Bell, female   DOB: December 08, 1935, 78 y.o.   MRN: [id number]  HPI The patient is a 78 year old female who is 6 months status post left breast lumpectomy for DCIS. She was ER and PR positive. She is taking tamoxifen and seems to be tolerating it well. She has no complaints today.  Review of Systems  Constitutional: Negative.   HENT: Negative.   Eyes: Negative.   Respiratory: Negative.   Cardiovascular: Negative.   Gastrointestinal: Negative.   Endocrine: Negative.   Genitourinary: Negative.   Musculoskeletal: Negative.   Skin: Negative.   Allergic/Immunologic: Negative.   Neurological: Negative.   Hematological: Negative.   Psychiatric/Behavioral: Negative.        Objective:   Physical Exam  Constitutional: She is oriented to person, place, and time. She appears well-developed and well-nourished.  HENT:  Head: Normocephalic and atraumatic.  Eyes: Conjunctivae and EOM are normal. Pupils are equal, round, and reactive to light.  Neck: Normal range of motion. Neck supple.  Cardiovascular: Normal rate, regular rhythm and normal heart sounds.   Pulmonary/Chest: Effort normal and breath sounds normal.  Her left breast incision is healing nicely with no sign of infection or significant seroma. There is no palpable mass in  Either breast. There is no palpable axillary, supraclavicular, or cervical lymphadenopathy  Abdominal: Soft. Bowel sounds are normal.  Musculoskeletal: Normal range of motion.  Lymphadenopathy:    She has no cervical adenopathy.  Neurological: She is alert and oriented to person, place, and time.  Skin: Skin is warm and dry.  Psychiatric: She has a normal mood and affect. Her behavior is normal.       Assessment:     The patient is 6 months status post left breast lumpectomy for DCIS     Plan:     At this point she will continue to take tamoxifen. She will continue to do regular self exams. I will plan to see her back  in about 6 months.

## 2014-04-28 ENCOUNTER — Other Ambulatory Visit: Payer: Self-pay | Admitting: Family Medicine

## 2014-05-04 ENCOUNTER — Other Ambulatory Visit: Payer: Self-pay | Admitting: Family Medicine

## 2014-05-26 ENCOUNTER — Other Ambulatory Visit (INDEPENDENT_AMBULATORY_CARE_PROVIDER_SITE_OTHER): Payer: Medicare Other

## 2014-05-26 DIAGNOSIS — E119 Type 2 diabetes mellitus without complications: Secondary | ICD-10-CM

## 2014-05-26 DIAGNOSIS — I10 Essential (primary) hypertension: Secondary | ICD-10-CM | POA: Diagnosis not present

## 2014-05-26 LAB — BASIC METABOLIC PANEL
BUN: 8 mg/dL (ref 6–23)
CO2: 28 mEq/L (ref 19–32)
Calcium: 9.3 mg/dL (ref 8.4–10.5)
Chloride: 103 mEq/L (ref 96–112)
Creatinine, Ser: 0.6 mg/dL (ref 0.4–1.2)
GFR: 101.32 mL/min (ref >60.00)
Glucose, Bld: 197 mg/dL — ABNORMAL HIGH (ref 70–99)
Potassium: 4.1 mEq/L (ref 3.5–5.1)
Sodium: 136 mEq/L (ref 135–145)

## 2014-05-26 LAB — HEMOGLOBIN A1C: HEMOGLOBIN A1C: 8.3 % — AB (ref 4.6–6.5)

## 2014-05-30 ENCOUNTER — Ambulatory Visit: Payer: Self-pay | Admitting: Ophthalmology

## 2014-05-30 DIAGNOSIS — Z01818 Encounter for other preprocedural examination: Secondary | ICD-10-CM | POA: Diagnosis not present

## 2014-05-30 DIAGNOSIS — H2510 Age-related nuclear cataract, unspecified eye: Secondary | ICD-10-CM | POA: Diagnosis not present

## 2014-05-30 DIAGNOSIS — H269 Unspecified cataract: Secondary | ICD-10-CM | POA: Diagnosis not present

## 2014-05-30 DIAGNOSIS — I10 Essential (primary) hypertension: Secondary | ICD-10-CM | POA: Diagnosis not present

## 2014-05-31 ENCOUNTER — Encounter: Payer: Self-pay | Admitting: Family Medicine

## 2014-05-31 ENCOUNTER — Ambulatory Visit: Payer: Medicare Other | Admitting: Family Medicine

## 2014-05-31 ENCOUNTER — Ambulatory Visit (INDEPENDENT_AMBULATORY_CARE_PROVIDER_SITE_OTHER): Payer: Medicare Other | Admitting: Family Medicine

## 2014-05-31 VITALS — BP 135/72 | HR 106 | Temp 98.1°F | Ht 62.0 in | Wt 148.8 lb

## 2014-05-31 DIAGNOSIS — Z23 Encounter for immunization: Secondary | ICD-10-CM

## 2014-05-31 DIAGNOSIS — I10 Essential (primary) hypertension: Secondary | ICD-10-CM

## 2014-05-31 DIAGNOSIS — E119 Type 2 diabetes mellitus without complications: Secondary | ICD-10-CM | POA: Diagnosis not present

## 2014-05-31 DIAGNOSIS — F430 Acute stress reaction: Secondary | ICD-10-CM | POA: Diagnosis not present

## 2014-05-31 NOTE — Assessment & Plan Note (Signed)
Lab Results  Component Value Date   HGBA1C 8.3* 05/26/2014   Not improving despite inc in metformin On max tol doses of this and glipizide  After cataract surg-she will check into covered meds for DM and get me a list  Need to add another agent  Disc s/s of hypoglycemia utd opthy

## 2014-05-31 NOTE — Assessment & Plan Note (Signed)
bp in fair control at this time  BP Readings from Last 1 Encounters:  05/31/14 135/72   No changes needed Disc lifstyle change with low sodium diet and exercise   Better on 2nd check today as well-pt was quite nervous

## 2014-05-31 NOTE — Assessment & Plan Note (Signed)
This seems to be getting worse  Very nervous today- about upcoming cataract surg and family issues Reviewed stressors/ coping techniques/symptoms/ support sources/ tx options and side effects in detail today  Will ref to counseling to work on this  Her anxiety does seem to adversely affect her bp and blood sugars

## 2014-05-31 NOTE — Progress Notes (Signed)
Pre visit review using our clinic review tool, if applicable. No additional management support is needed unless otherwise documented below in the visit note. 

## 2014-05-31 NOTE — Patient Instructions (Signed)
Once your cataract surgery is done - find out from your insurance (get a list) of covered medicines for diabetes - because we have to add something  Do the best you can with diet  If you are interested in a shingles/zoster vaccine - call your insurance to check on coverage,( you should not get it within 1 month of other vaccines) , then call us for a prescription  for it to take to a pharmacy that gives the shot , or make a nurse visit to get it here depending on your coverage  Stop up front to get your referral to counseling for anxiety  Take are of yourself  Follow up with me in 3 months with labs prior
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
## 2014-06-08 ENCOUNTER — Ambulatory Visit: Payer: Self-pay | Admitting: Ophthalmology

## 2014-06-08 DIAGNOSIS — Z7982 Long term (current) use of aspirin: Secondary | ICD-10-CM | POA: Diagnosis not present

## 2014-06-08 DIAGNOSIS — H2510 Age-related nuclear cataract, unspecified eye: Secondary | ICD-10-CM | POA: Diagnosis not present

## 2014-06-08 DIAGNOSIS — H269 Unspecified cataract: Secondary | ICD-10-CM | POA: Diagnosis not present

## 2014-06-08 DIAGNOSIS — I10 Essential (primary) hypertension: Secondary | ICD-10-CM | POA: Diagnosis not present

## 2014-06-08 DIAGNOSIS — K219 Gastro-esophageal reflux disease without esophagitis: Secondary | ICD-10-CM | POA: Diagnosis not present

## 2014-06-08 DIAGNOSIS — Z87891 Personal history of nicotine dependence: Secondary | ICD-10-CM | POA: Diagnosis not present

## 2014-06-22 ENCOUNTER — Ambulatory Visit: Payer: Medicare Other | Admitting: Psychology

## 2014-07-15 DIAGNOSIS — H2511 Age-related nuclear cataract, right eye: Secondary | ICD-10-CM | POA: Diagnosis not present

## 2014-07-18 ENCOUNTER — Other Ambulatory Visit: Payer: Self-pay | Admitting: Family Medicine

## 2014-07-27 ENCOUNTER — Other Ambulatory Visit: Payer: Self-pay | Admitting: Family Medicine

## 2014-07-28 ENCOUNTER — Other Ambulatory Visit: Payer: Self-pay

## 2014-07-28 MED ORDER — GLUCOSE BLOOD VI STRP: ORAL_STRIP | Status: DC

## 2014-07-28 NOTE — Telephone Encounter (Signed)
T[facility]he[facility] left v/m requesting update on Accuchek test strips due to ICD 10 coding. Refill done.

## 2014-08-01 ENCOUNTER — Encounter: Payer: Self-pay | Admitting: Family Medicine

## 2014-08-03 DIAGNOSIS — H2511 Age-related nuclear cataract, right eye: Secondary | ICD-10-CM | POA: Diagnosis not present

## 2014-08-10 ENCOUNTER — Ambulatory Visit: Payer: Self-pay | Admitting: Ophthalmology

## 2014-08-10 DIAGNOSIS — I10 Essential (primary) hypertension: Secondary | ICD-10-CM | POA: Diagnosis not present

## 2014-08-10 DIAGNOSIS — Z853 Personal history of malignant neoplasm of breast: Secondary | ICD-10-CM | POA: Diagnosis not present

## 2014-08-10 DIAGNOSIS — E119 Type 2 diabetes mellitus without complications: Secondary | ICD-10-CM | POA: Diagnosis not present

## 2014-08-10 DIAGNOSIS — Z8542 Personal history of malignant neoplasm of other parts of uterus: Secondary | ICD-10-CM | POA: Diagnosis not present

## 2014-08-10 DIAGNOSIS — Z7982 Long term (current) use of aspirin: Secondary | ICD-10-CM | POA: Diagnosis not present

## 2014-08-10 DIAGNOSIS — H269 Unspecified cataract: Secondary | ICD-10-CM | POA: Diagnosis not present

## 2014-08-10 DIAGNOSIS — H2511 Age-related nuclear cataract, right eye: Secondary | ICD-10-CM | POA: Diagnosis not present

## 2014-08-22 ENCOUNTER — Telehealth: Payer: Self-pay | Admitting: Family Medicine

## 2014-08-22 DIAGNOSIS — E782 Mixed hyperlipidemia: Secondary | ICD-10-CM

## 2014-08-22 DIAGNOSIS — I10 Essential (primary) hypertension: Secondary | ICD-10-CM

## 2014-08-22 DIAGNOSIS — IMO0002 Reserved for concepts with insufficient information to code with codable children: Secondary | ICD-10-CM

## 2014-08-22 DIAGNOSIS — E1165 Type 2 diabetes mellitus with hyperglycemia: Secondary | ICD-10-CM

## 2014-08-22 NOTE — Telephone Encounter (Signed)
-----   Message from Ellamae Sia sent at 08/18/2014  3:04 PM EST ----- Regarding: Lab orders for Tuesday, 11.24.15 3 month labs

## 2014-08-23 ENCOUNTER — Other Ambulatory Visit (INDEPENDENT_AMBULATORY_CARE_PROVIDER_SITE_OTHER): Payer: Medicare Other

## 2014-08-23 DIAGNOSIS — E782 Mixed hyperlipidemia: Secondary | ICD-10-CM

## 2014-08-23 DIAGNOSIS — E1165 Type 2 diabetes mellitus with hyperglycemia: Secondary | ICD-10-CM | POA: Diagnosis not present

## 2014-08-23 DIAGNOSIS — IMO0002 Reserved for concepts with insufficient information to code with codable children: Secondary | ICD-10-CM

## 2014-08-23 DIAGNOSIS — I10 Essential (primary) hypertension: Secondary | ICD-10-CM | POA: Diagnosis not present

## 2014-08-23 LAB — COMPREHENSIVE METABOLIC PANEL
ALT: 15 U/L (ref 0–35)
AST: 12 U/L (ref 0–37)
Albumin: 3.7 g/dL (ref 3.5–5.2)
Alkaline Phosphatase: 44 U/L (ref 39–117)
BUN: 11 mg/dL (ref 6–23)
CHLORIDE: 102 meq/L (ref 96–112)
CO2: 28 mEq/L (ref 19–32)
CREATININE: 0.6 mg/dL (ref 0.4–1.2)
Calcium: 9.3 mg/dL (ref 8.4–10.5)
GFR: 105.23 mL/min (ref >60.00)
Glucose, Bld: 183 mg/dL — ABNORMAL HIGH (ref 70–99)
POTASSIUM: 4.4 meq/L (ref 3.5–5.1)
Sodium: 138 mEq/L (ref 135–145)
Total Bilirubin: 0.4 mg/dL (ref 0.2–1.2)
Total Protein: 6.4 g/dL (ref 6.0–8.3)

## 2014-08-23 LAB — LIPID PANEL
CHOL/HDL RATIO: 3
Cholesterol: 164 mg/dL (ref 0–200)
HDL: 53.1 mg/dL (ref >39.00)
LDL CALC: 87 mg/dL (ref 0–99)
NonHDL: 110.9
Triglycerides: 121 mg/dL (ref 0.0–149.0)
VLDL: 24.2 mg/dL (ref 0.0–40.0)

## 2014-08-23 LAB — HEMOGLOBIN A1C: Hgb A1c MFr Bld: 8.8 % — ABNORMAL HIGH (ref 4.6–6.5)

## 2014-08-29 ENCOUNTER — Other Ambulatory Visit: Payer: Self-pay | Admitting: Family Medicine

## 2014-08-30 ENCOUNTER — Ambulatory Visit (INDEPENDENT_AMBULATORY_CARE_PROVIDER_SITE_OTHER): Payer: Medicare Other | Admitting: Family Medicine

## 2014-08-30 ENCOUNTER — Encounter: Payer: Self-pay | Admitting: Family Medicine

## 2014-08-30 VITALS — BP 144/80 | HR 96 | Temp 97.9°F | Ht 62.0 in | Wt 149.0 lb

## 2014-08-30 DIAGNOSIS — I10 Essential (primary) hypertension: Secondary | ICD-10-CM

## 2014-08-30 DIAGNOSIS — IMO0002 Reserved for concepts with insufficient information to code with codable children: Secondary | ICD-10-CM

## 2014-08-30 DIAGNOSIS — E1165 Type 2 diabetes mellitus with hyperglycemia: Secondary | ICD-10-CM | POA: Diagnosis not present

## 2014-08-30 DIAGNOSIS — F430 Acute stress reaction: Secondary | ICD-10-CM

## 2014-08-30 NOTE — Progress Notes (Signed)
Pre visit review using our clinic review tool, if applicable. No additional management support is needed unless otherwise documented below in the visit note. 

## 2014-08-30 NOTE — Patient Instructions (Addendum)
I need a list of diabetic medicines that are covered by your insurance so we can pick one to add on  I am considering lantus basal insulin  Follow up in 3 months with labs prior  Try to get better about diet and exercise

## 2014-08-30 NOTE — Progress Notes (Signed)
   Subjective:    Patient ID: Laura Bell, female    DOB: [birth year], 78 y.o.   MRN: [id number]  HPI Here for f/u of chronic problems   Had her cataract surgery- felt less anxious and did not need the counseling  Glad to have that behind her Can see so much better   Wt is stable with bmi of 27  bp is up a bit today (she took pill a few minutes ago)   No cp or palpitations or headaches or edema  No side effects to medicines  BP Readings from Last 3 Encounters:  08/30/14 144/80  05/31/14 135/72  03/25/14 129/82     Lipids Lab Results  Component Value Date   CHOL 164 08/23/2014   HDL 53.10 08/23/2014   LDLCALC 87 08/23/2014   TRIG 121.0 08/23/2014   CHOLHDL 3 08/23/2014   good control  Plans to start walking now that surgery is over with    Diabetes Home sugar results = this month starting to come down a bit - now surgery is over  DM diet -not good  Exercise -not good  Symptoms- none  A1C last  Lab Results  Component Value Date   HGBA1C 8.8* 08/23/2014   She has not found out what additional agents are covered  No problems with medications  Renal protection ace  Last eye exam recent     Review of Systems     Objective:   Physical Exam        Assessment & Plan:

## 2014-08-31 NOTE — Assessment & Plan Note (Addendum)
bp in fair control at this time - but up a bit since pt took her medicine only 5 minutes ago  BP Readings from Last 1 Encounters:  08/30/14 144/80   No changes needed Disc lifstyle change with low sodium diet and exercise   Will re check at next visit

## 2014-08-31 NOTE — Assessment & Plan Note (Signed)
Improved now that cataract surgery is over Pt chose to cancel her counseling appt

## 2014-08-31 NOTE — Assessment & Plan Note (Signed)
Although blood glucose levels are not coming down after decrease in stress  Lab Results  Component Value Date   HGBA1C 8.8* 08/23/2014   still quite high  Need to add medication - likely basal insulin Pt will get list of covered medications from her ins co and get that to [place]  In the meantime disc opt diet and exercise

## 2014-09-16 ENCOUNTER — Other Ambulatory Visit: Payer: Self-pay

## 2014-09-16 MED ORDER — GLUCOSE BLOOD VI STRP: ORAL_STRIP | Status: DC

## 2014-09-16 MED ORDER — LISINOPRIL 40 MG PO TABS: 40.0000 mg | ORAL_TABLET | Freq: Every day | ORAL | Status: DC

## 2014-09-16 MED ORDER — ACCU-CHEK SOFTCLIX LANCETS MISC: Status: DC

## 2014-09-16 NOTE — Telephone Encounter (Signed)
Tristan with [facility] left v/m requesting refill on lisinopril, test strips,lancets; refills sent electronically to [facility].

## 2014-10-12 DIAGNOSIS — H0013 Chalazion right eye, unspecified eyelid: Secondary | ICD-10-CM | POA: Diagnosis not present

## 2014-10-17 ENCOUNTER — Other Ambulatory Visit: Payer: Self-pay | Admitting: Family Medicine

## 2014-10-19 DIAGNOSIS — D0512 Intraductal carcinoma in situ of left breast: Secondary | ICD-10-CM | POA: Diagnosis not present

## 2014-11-10 ENCOUNTER — Ambulatory Visit (HOSPITAL_BASED_OUTPATIENT_CLINIC_OR_DEPARTMENT_OTHER): Payer: Medicare Other | Admitting: Oncology

## 2014-11-10 ENCOUNTER — Telehealth: Payer: Self-pay | Admitting: Oncology

## 2014-11-10 VITALS — BP 145/79 | HR 102 | Temp 97.9°F | Resp 18 | Ht 62.0 in | Wt 148.6 lb

## 2014-11-10 DIAGNOSIS — D0512 Intraductal carcinoma in situ of left breast: Secondary | ICD-10-CM | POA: Diagnosis not present

## 2014-11-10 DIAGNOSIS — Z170 Estrogen receptor positive status [ER+]: Secondary | ICD-10-CM

## 2014-11-10 DIAGNOSIS — IMO0002 Reserved for concepts with insufficient information to code with codable children: Secondary | ICD-10-CM

## 2014-11-10 DIAGNOSIS — E1165 Type 2 diabetes mellitus with hyperglycemia: Secondary | ICD-10-CM

## 2014-11-10 DIAGNOSIS — C50512 Malignant neoplasm of lower-outer quadrant of left female breast: Secondary | ICD-10-CM

## 2014-11-10 DIAGNOSIS — E782 Mixed hyperlipidemia: Secondary | ICD-10-CM

## 2014-11-10 MED ORDER — TAMOXIFEN CITRATE 20 MG PO TABS: 20.0000 mg | ORAL_TABLET | Freq: Every day | ORAL | Status: DC

## 2014-11-10 NOTE — Telephone Encounter (Signed)
, °
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
## 2014-11-15 ENCOUNTER — Encounter: Payer: Self-pay | Admitting: Family Medicine

## 2014-11-15 ENCOUNTER — Ambulatory Visit (INDEPENDENT_AMBULATORY_CARE_PROVIDER_SITE_OTHER): Payer: Medicare Other | Admitting: Family Medicine

## 2014-11-15 VITALS — BP 144/82 | HR 112 | Temp 97.6°F | Ht 62.0 in | Wt 147.8 lb

## 2014-11-15 DIAGNOSIS — L298 Other pruritus: Secondary | ICD-10-CM | POA: Diagnosis not present

## 2014-11-15 DIAGNOSIS — N898 Other specified noninflammatory disorders of vagina: Secondary | ICD-10-CM

## 2014-11-15 LAB — POCT WET PREP (WET MOUNT): KOH WET PREP POC: POSITIVE

## 2014-11-15 MED ORDER — FLUCONAZOLE 150 MG PO TABS: 150.0000 mg | ORAL_TABLET | Freq: Once | ORAL | Status: DC

## 2014-11-15 MED ORDER — TERCONAZOLE 0.8 % VA CREA: TOPICAL_CREAM | VAGINAL | Status: DC

## 2014-11-15 NOTE — Patient Instructions (Signed)
Take the diflucan pill today (skip the zocor tonight)  Use the terazol cream daily as needed  Keep the area as dry as possible  Update if not starting to improve in a week or if worsening

## 2014-11-15 NOTE — Assessment & Plan Note (Signed)
Yeast on wet prep Yeast vaginitis in a diabetic tx with diflcuan times one (skip simvastatin that day) Also terazol cream  Update if not starting to improve in a week or if worsening
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
## 2014-11-15 NOTE — Progress Notes (Signed)
Pre visit review using our clinic review tool, if applicable. No additional management support is needed unless otherwise documented below in the visit note. 

## 2014-11-21 ENCOUNTER — Telehealth: Payer: Self-pay | Admitting: Family Medicine

## 2014-11-21 DIAGNOSIS — E782 Mixed hyperlipidemia: Secondary | ICD-10-CM

## 2014-11-21 DIAGNOSIS — E1165 Type 2 diabetes mellitus with hyperglycemia: Secondary | ICD-10-CM

## 2014-11-21 DIAGNOSIS — IMO0002 Reserved for concepts with insufficient information to code with codable children: Secondary | ICD-10-CM

## 2014-11-21 DIAGNOSIS — I10 Essential (primary) hypertension: Secondary | ICD-10-CM

## 2014-11-21 NOTE — Telephone Encounter (Signed)
-----   Message from Ellamae Sia sent at 11/16/2014  5:40 PM EST ----- Regarding: Lab orders for Tuesday,2.23.16 Lab orders for a f/u appt

## 2014-11-22 ENCOUNTER — Other Ambulatory Visit (INDEPENDENT_AMBULATORY_CARE_PROVIDER_SITE_OTHER): Payer: Medicare Other

## 2014-11-22 DIAGNOSIS — E1165 Type 2 diabetes mellitus with hyperglycemia: Secondary | ICD-10-CM

## 2014-11-22 DIAGNOSIS — IMO0002 Reserved for concepts with insufficient information to code with codable children: Secondary | ICD-10-CM

## 2014-11-22 LAB — HEMOGLOBIN A1C: HEMOGLOBIN A1C: 8.6 % — AB (ref 4.6–6.5)

## 2014-11-24 ENCOUNTER — Other Ambulatory Visit: Payer: Self-pay | Admitting: *Deleted

## 2014-11-24 DIAGNOSIS — C50512 Malignant neoplasm of lower-outer quadrant of left female breast: Secondary | ICD-10-CM

## 2014-11-25 ENCOUNTER — Ambulatory Visit: Payer: Self-pay

## 2014-11-25 DIAGNOSIS — Z853 Personal history of malignant neoplasm of breast: Secondary | ICD-10-CM | POA: Diagnosis not present

## 2014-11-25 DIAGNOSIS — R921 Mammographic calcification found on diagnostic imaging of breast: Secondary | ICD-10-CM | POA: Diagnosis not present

## 2014-11-28 ENCOUNTER — Telehealth: Payer: Self-pay | Admitting: *Deleted

## 2014-11-28 NOTE — Telephone Encounter (Signed)
PT. WAS TOLD SHE WOULD NEED A MRI AT [facility] TO SEE IF HER CANCER HAS METASTASIZED AND SOMEONE WOULD CALL TO SCHEDULE IT. DAUGHTER WOULD LIKE A RETURN CALL TOMORROW.

## 2014-11-29 ENCOUNTER — Telehealth: Payer: Self-pay | Admitting: *Deleted

## 2014-11-29 ENCOUNTER — Ambulatory Visit (INDEPENDENT_AMBULATORY_CARE_PROVIDER_SITE_OTHER): Payer: Medicare Other | Admitting: Family Medicine

## 2014-11-29 ENCOUNTER — Encounter: Payer: Self-pay | Admitting: Family Medicine

## 2014-11-29 VITALS — BP 146/80 | HR 108 | Temp 97.5°F | Ht 62.0 in | Wt 148.4 lb

## 2014-11-29 DIAGNOSIS — IMO0002 Reserved for concepts with insufficient information to code with codable children: Secondary | ICD-10-CM

## 2014-11-29 DIAGNOSIS — E1165 Type 2 diabetes mellitus with hyperglycemia: Secondary | ICD-10-CM | POA: Diagnosis not present

## 2014-11-29 MED ORDER — EMPAGLIFLOZIN 10 MG PO TABS: 10.0000 mg | ORAL_TABLET | Freq: Every day | ORAL | Status: DC

## 2014-11-29 NOTE — Progress Notes (Signed)
Pre visit review using our clinic review tool, if applicable. No additional management support is needed unless otherwise documented below in the visit note. 

## 2014-11-29 NOTE — Telephone Encounter (Signed)
This RN spoke with pt's daughter per her call stating " mom says she was called by someone who said she needed an MRI of her body to evaluate if her cancer had spread "  This RN reviewed chart - discussed with dtr no order for MRI noted. Pt had MRI last week at [facility] in [place] and per dtr " mom said they brought her back in and took multiple additional views of her breast "  This RN informed Arbie Cookey probable need for MRI of mother's breast is needed per [facility] if they saw an area that was abnormal or too dense per mammogram films.  Arbie Cookey will contact them tomorrow to follow up.  This RN will follow up as well to see if orders are needed for additional concerns.  Arbie Cookey appreciated clarification.

## 2014-11-29 NOTE — Patient Instructions (Signed)
Hold glipizide  Continue metformin  Start Jardiance tomorrow one pill daily  If any side effects let me know  Continue the good work with diet and exercise    Follow up in 3 months with labs prior
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
## 2014-11-30 NOTE — Assessment & Plan Note (Signed)
Lab Results  Component Value Date   HGBA1C 8.6* 11/22/2014   Not at goal  Pt refuses injectable drugs of any kind incl insulin  Overall diet and exercise are ok  Will try Jardiance 10 mg daily (can adv dose if well tolerated) Disc poss side eff in detail and handout given on the medication  Will hold glipizide but continue metformin  F/u 3 mo with lab prior

## 2014-12-01 ENCOUNTER — Other Ambulatory Visit: Payer: Self-pay | Admitting: *Deleted

## 2014-12-01 ENCOUNTER — Telehealth: Payer: Self-pay | Admitting: *Deleted

## 2014-12-01 DIAGNOSIS — C50512 Malignant neoplasm of lower-outer quadrant of left female breast: Secondary | ICD-10-CM

## 2014-12-01 DIAGNOSIS — R928 Other abnormal and inconclusive findings on diagnostic imaging of breast: Secondary | ICD-10-CM

## 2014-12-01 NOTE — Telephone Encounter (Signed)
Per mammogram performed at [facility] at [facility] in [place]- concern due to areas of calcifications at prior lumpectomy site.  Per known history need for additional follow up with request for MRI of breast.  Order placed for MRI of breast to be done locally- ( above facility does not perform breast MRI ).  This RN called to discuss with pt- obtained answering machine- general message left requesting a return call.  Note this RN had spoken to pt's daughter earlier this week per above need.

## 2014-12-01 NOTE — Telephone Encounter (Signed)
Called patient to notify her of ordered MRI. Verbalized to that someone will be contacting her to set up an appointment. Patient verbalized understanding.

## 2014-12-07 ENCOUNTER — Telehealth: Payer: Self-pay

## 2014-12-07 NOTE — Telephone Encounter (Signed)
Pt spoke with [place] this morning to ck on Jardiance prescription and pt was advised to contact PCP; is there a PA being done for Jardiance. Pt request cb.

## 2014-12-08 NOTE — Telephone Encounter (Signed)
Spoke with patient.  Informed her prior Josem Kaufmann has been initiated for jardiance.

## 2014-12-09 ENCOUNTER — Other Ambulatory Visit: Payer: Self-pay | Admitting: *Deleted

## 2014-12-09 NOTE — Telephone Encounter (Signed)
PA submitted through covermymeds for [employer]

## 2014-12-14 ENCOUNTER — Ambulatory Visit (HOSPITAL_COMMUNITY)
Admission: RE | Admit: 2014-12-14 | Discharge: 2014-12-14 | Disposition: A | Discharge status: Home / Self Care | Payer: Medicare Other | Source: Ambulatory Visit | Attending: Oncology | Admitting: Oncology

## 2014-12-14 ENCOUNTER — Other Ambulatory Visit: Payer: Medicare Other

## 2014-12-14 ENCOUNTER — Other Ambulatory Visit: Payer: Self-pay | Admitting: Oncology

## 2014-12-14 DIAGNOSIS — C50512 Malignant neoplasm of lower-outer quadrant of left female breast: Secondary | ICD-10-CM | POA: Diagnosis not present

## 2014-12-14 DIAGNOSIS — R928 Other abnormal and inconclusive findings on diagnostic imaging of breast: Secondary | ICD-10-CM

## 2014-12-14 LAB — POCT I-STAT CREATININE: CREATININE: 0.7 mg/dL (ref 0.50–1.10)

## 2014-12-16 ENCOUNTER — Telehealth: Payer: Self-pay | Admitting: *Deleted

## 2014-12-16 NOTE — Telephone Encounter (Signed)
This RN spoke with pt per her concern with MRI. RN validated pt's feelings with MRI - including " it was so loud and you had to lay on this steel thing that went up the middle of your chest " " I am sorry I just couldn't do it "  This RN informed pt several patients have reactions with scan procedures that are unknown.  This RN inquired with pt if she would be willing to take medication to help her relax to obtain needed scan. Informed pt when medication used as above need to pt to have someone drive her.  Pt is very willing to try to do MRI again with medication- " but I do not know when my daughter could bring me- she is with her daughter right now who is in labor "  This RN informed pt scan can be done late next week- and this RN can communicate with daughter regarding availability for transportation.  At end of discussion pt stating appreciation for " understanding " and plan .

## 2014-12-16 NOTE — Telephone Encounter (Signed)
PT. APOLOGIZED THAT SHE COULD NOT STAY STILL. SHE ASKED IF THERE IS ANYTHING ELSE TO DO.

## 2014-12-19 ENCOUNTER — Other Ambulatory Visit: Payer: Self-pay | Admitting: Family Medicine

## 2014-12-20 ENCOUNTER — Other Ambulatory Visit: Payer: Self-pay

## 2014-12-20 ENCOUNTER — Telehealth: Payer: Self-pay | Admitting: Family Medicine

## 2014-12-20 MED ORDER — GLUCOSE BLOOD VI STRP: ORAL_STRIP | Status: DC

## 2014-12-20 MED ORDER — ACCU-CHEK SOFTCLIX LANCET DEV MISC: Status: DC

## 2014-12-20 NOTE — Telephone Encounter (Signed)
Patient aware to use the yeast infection treatment as she has, continue diabetic medicine and watch spots on skin.

## 2014-12-20 NOTE — Telephone Encounter (Signed)
Use the yeast infection treatment that she has  Continue the diabetic medicine  Watch the spots on skin   If yeast infection does not get better or if skin spots worsen- hold DM med and let me know

## 2014-12-20 NOTE — Telephone Encounter (Signed)
[facility] Patient Name: Laura Bell DOB: 06/01/1936 Initial Comment caller states she was put on a new diabetic medication last week - today has a yeast infection and red spots on one breast - has read that these are side effects of the medication Nurse Assessment Nurse: Laura Dawley, RN, Laura Date/Time Laura Bell Time): 12/20/2014 11:17:54 AM Confirm and document reason for call. If symptomatic, describe symptoms. ---CALLER STATES THAT SHE WAS PUT ON NEW MEDICATION LAST WEEK FOR DIABETES. SHE HAS STARTED A YEAST INFECTION TODAY AND SHE A COUPLE OF RED SPOTS ON ONE OF HER BREAST. SHE WAS STARTED ON JARDIANCE 10 MG A DAY ON 3/13. SHE STILL HAS SOME YEAST INFECTION MEDICATION FROM BEFORE - A CREAM AND SHE WILL START USING THAT MEDICATION TODAY. SHE STATES THAT SHE IS NOT HAVING ANY BODY ITCHING OR ANY OTHER SYMPTOMS AND HER BS LEVELS HAVE BEEN DECREASED WHILE TAKING THE MEDICATIONS. SHE HAS BEEN RUNNING AROUND 157, [place] FOR THE PAST COUPLE OF READINGS. Has the patient traveled out of the country within the last 30 days? ---Not Applicable Does the patient require triage? ---Yes Related visit to physician within the last 2 weeks? ---Yes Does the PT have any chronic conditions? (i.e. diabetes, asthma, etc.) ---Yes List chronic conditions. ---DIABETIC, HTN, HIGH CHOLESTEROL Guidelines Guideline Title Affirmed Question Affirmed Notes Medication Question Call Caller has URGENT medication question about med that PCP prescribed and triager unable to answer question Final Disposition User Call PCP Now [place], RN, Laura Comments CALLED THE OFFICE AND SPOKE WITH Laura Bell. GAVE HER THE INFORMATION REGARDING THE OUTCOME AND THE REASON FOR THE CALL TO THE OFFICE. SHE STATES THAT THE MEDICATION DOES NOT NEED TO BE TAKEN ANY MORE UNTIL THE MD CAN ADDRESS THIS. THEY WILL CALL THE PATIENT BACK AND INFORM HER  OF WHAT SHE NEEDS TO DO FURTHER. TH TRIAGE NURSE CALLED Laura Bell BACK AND INFORMED HER THAT SINCE SHE HAS ALREADY TAKEN THE JARDIANCE TODAY THAT SHE WILL NOT PLEASE NOTE: All timestamps contained within this report are represented as [place] Standard Time. CONFIDENTIALTY NOTICE: This fax transmission is intended only for the addressee. It contains information that is legally privileged, confidential or otherwise protected from use or disclosure. If you are not the intended recipient, you are strictly prohibited from reviewing, disclosing, copying using or disseminating any of this information or taking any action in reliance on or regarding this information. If you have received this fax in error, please notify us immediately by telephone so that we can arrange for its return to [place]. Phone: [phone], Toll-Free: [phone], Fax: [phone] Page: 2 of 2 Call Id: [id number] Comments NEED TO WORRY ABOUT IT TODAY, BUT THAT SHE WILL NEED TO WAIT AND HEAR BACK DIRECTLY FROM THE MD OFFICE AND THEY WILL DIRECT HER FURTHER ON WHAT SHE NEEDS TO DO REGARDING THE MEDICATION. SHE VERBALIZES UNDERSTANDING OF THIS INSTRUCTION GIVEN. WILL CLOSE THIS CALL, WITH THIS AS THE FINAL INFORMATION. MD OFFICE WILL FOLLOW UP WITH THE PATIENT DIRECTLY.

## 2014-12-21 ENCOUNTER — Telehealth: Payer: Self-pay | Admitting: *Deleted

## 2014-12-21 ENCOUNTER — Other Ambulatory Visit: Payer: Self-pay | Admitting: Oncology

## 2014-12-21 NOTE — Telephone Encounter (Signed)
This RN tried calling daughter, Arbie Cookey, at home, work and on [employer] and never got a response. Per Dr. Jana Hakim, patient needs to take Ativan 1 mg prior to MRI to help with relaxation. Patient stated,"I don't know if I want to do that again or not. Is there something else I can do? I will talk with my daughter about the medicine and the MRI. Arbie Cookey is with her daughter who just had a baby and things didn't go well." I instructed patient not to stress about it, talk with her daughter, and then call the office and let us know if she wants the MRI rescheduled. I held off on calling the Ativan in to her pharmacy. Patient verbalized understanding.

## 2014-12-22 ENCOUNTER — Other Ambulatory Visit: Payer: Self-pay | Admitting: *Deleted

## 2014-12-22 DIAGNOSIS — C50512 Malignant neoplasm of lower-outer quadrant of left female breast: Secondary | ICD-10-CM

## 2014-12-26 MED ORDER — GLUCOSE BLOOD VI STRP: ORAL_STRIP | Status: DC

## 2014-12-26 NOTE — Addendum Note (Signed)
Addended by: Carter Kitten on: 12/26/2014 02:16 PM   Modules accepted: Orders

## 2014-12-27 ENCOUNTER — Other Ambulatory Visit: Payer: Self-pay

## 2014-12-27 MED ORDER — ACCU-CHEK SOFTCLIX LANCETS MISC: Status: DC

## 2014-12-30 HISTORY — PX: BREAST BIOPSY: SHX20

## 2015-01-02 ENCOUNTER — Encounter: Payer: Self-pay | Admitting: Family Medicine

## 2015-01-02 ENCOUNTER — Ambulatory Visit (INDEPENDENT_AMBULATORY_CARE_PROVIDER_SITE_OTHER): Payer: Medicare Other | Admitting: Family Medicine

## 2015-01-02 VITALS — BP 134/78 | HR 89 | Temp 98.0°F | Ht 62.0 in | Wt 145.0 lb

## 2015-01-02 DIAGNOSIS — N3946 Mixed incontinence: Secondary | ICD-10-CM

## 2015-01-02 DIAGNOSIS — IMO0002 Reserved for concepts with insufficient information to code with codable children: Secondary | ICD-10-CM

## 2015-01-02 DIAGNOSIS — E1165 Type 2 diabetes mellitus with hyperglycemia: Secondary | ICD-10-CM | POA: Diagnosis not present

## 2015-01-02 MED ORDER — SIMVASTATIN 20 MG PO TABS: ORAL_TABLET | ORAL | Status: DC

## 2015-01-02 MED ORDER — METFORMIN HCL 1000 MG PO TABS: 1000.0000 mg | ORAL_TABLET | Freq: Two times a day (BID) | ORAL | Status: DC

## 2015-01-02 MED ORDER — LISINOPRIL 40 MG PO TABS: 40.0000 mg | ORAL_TABLET | Freq: Every day | ORAL | Status: DC

## 2015-01-02 NOTE — Progress Notes (Signed)
Pre visit review using our clinic review tool, if applicable. No additional management support is needed unless otherwise documented below in the visit note. 

## 2015-01-02 NOTE — Assessment & Plan Note (Signed)
Her blood sugars are up a bit  On jardiance and metfromin  Will inc her am metformin to 1000 mg and report back  No low glucose  Disc diet/exercise

## 2015-01-02 NOTE — Assessment & Plan Note (Addendum)
Hx of cystocele and rectocele  Had bladder tack in 1995-worked well initially  Also hysterectomy in the past   Will try to better control glucose-re: volume  Also tx constipation with miralax as needed   Failed detrol LA   Ref to urology for eval and tx
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
## 2015-01-02 NOTE — Patient Instructions (Signed)
Go up on metformin to 1000 mg twice daily -keep watching glucose levels  Stop at check out for urology referral  For constipation - use miralax as needed over the counter

## 2015-01-04 ENCOUNTER — Other Ambulatory Visit: Payer: Self-pay | Admitting: *Deleted

## 2015-01-04 ENCOUNTER — Other Ambulatory Visit: Payer: Self-pay | Admitting: Oncology

## 2015-01-04 ENCOUNTER — Telehealth: Payer: Self-pay

## 2015-01-04 DIAGNOSIS — C50919 Malignant neoplasm of unspecified site of unspecified female breast: Secondary | ICD-10-CM

## 2015-01-04 NOTE — Telephone Encounter (Signed)
Please let her know I have cheduled a mammogram at [facility] witrhin the week. We will not do MRI.  Thanks!

## 2015-01-04 NOTE — Telephone Encounter (Signed)
Returned call fromCarol wood: called asking if there is another test or something further to do for her mother. Pt was unable to tolerate MRI. When I called Arbie Cookey back she stated her mom was adamant of not taking another MRI, she was aware of offer of ativan and still refused. I mentioned a possibility of maybe an open MRI. This message forwarded to Dr Jana Hakim for management. Daughter said "just tell me what to do and I will get my mother there".

## 2015-01-05 ENCOUNTER — Telehealth: Payer: Self-pay | Admitting: Nurse Practitioner

## 2015-01-05 ENCOUNTER — Telehealth: Payer: Self-pay | Admitting: Oncology

## 2015-01-05 ENCOUNTER — Other Ambulatory Visit: Payer: Self-pay | Admitting: *Deleted

## 2015-01-05 NOTE — Telephone Encounter (Signed)
per pof to sch pt appt-gave pt copy of sch °

## 2015-01-05 NOTE — Telephone Encounter (Signed)
petr Val to call pt daughter Arbie Cookey @ [phone] and adv of time /location/date of mamma and US-left message

## 2015-01-11 ENCOUNTER — Ambulatory Visit
Admission: RE | Admit: 2015-01-11 | Discharge: 2015-01-11 | Disposition: A | Discharge status: Home / Self Care | Payer: Medicare Other | Source: Ambulatory Visit | Attending: Oncology | Admitting: Oncology

## 2015-01-11 ENCOUNTER — Other Ambulatory Visit: Payer: Self-pay | Admitting: Oncology

## 2015-01-11 DIAGNOSIS — C50512 Malignant neoplasm of lower-outer quadrant of left female breast: Secondary | ICD-10-CM

## 2015-01-11 DIAGNOSIS — R921 Mammographic calcification found on diagnostic imaging of breast: Secondary | ICD-10-CM

## 2015-01-11 DIAGNOSIS — Z853 Personal history of malignant neoplasm of breast: Secondary | ICD-10-CM | POA: Diagnosis not present

## 2015-01-18 ENCOUNTER — Ambulatory Visit
Admission: RE | Admit: 2015-01-18 | Discharge: 2015-01-18 | Disposition: A | Discharge status: Home / Self Care | Payer: Medicare Other | Source: Ambulatory Visit | Attending: Oncology | Admitting: Oncology

## 2015-01-18 ENCOUNTER — Other Ambulatory Visit: Payer: Self-pay | Admitting: Oncology

## 2015-01-18 DIAGNOSIS — R921 Mammographic calcification found on diagnostic imaging of breast: Secondary | ICD-10-CM

## 2015-01-18 DIAGNOSIS — Z853 Personal history of malignant neoplasm of breast: Secondary | ICD-10-CM | POA: Diagnosis not present

## 2015-01-18 DIAGNOSIS — N6012 Diffuse cystic mastopathy of left breast: Secondary | ICD-10-CM | POA: Diagnosis not present

## 2015-01-21 NOTE — Op Note (Signed)
PATIENT NAME:  Laura Bell, Laura Bell MR#:  [id number] DATE OF BIRTH:  [birth year]  DATE OF PROCEDURE:  06/08/2014  PREOPERATIVE DIAGNOSIS:  Nuclear sclerotic cataract, left eye.  POSTOPERATIVE DIAGNOSIS:  Nuclear sclerotic cataract, left eye.  PROCEDURE:  Phacoemulsification with posterior chamber intraocular lens implantation of the left eye.  LENS:  ZCBOO, a 24.0-diopter posterior chamber intraocular lens.  ULTRASOUND TIME:  19% of 1 minute, 30 seconds.  CDE of 17.2   SURGEON:  Mali Grant Henkes, MD  ANESTHESIA:  Topical with tetracaine drops and 2% Xylocaine jelly.  COMPLICATIONS:  None.  DESCRIPTION OF PROCEDURE:  The patient was identified in the holding room and transported to the operating room and placed in the supine position under the operating microscope.  The left eye was identified as the operative eye and it was prepped and draped in the usual sterile ophthalmic fashion.  A 1 millimeter clear-corneal paracentesis was made at the 1:30 position.  The anterior chamber was filled with Viscoat viscoelastic.  A 2.4 millimeter keratome was used to make a near-clear corneal incision at the 10:30 position.  A curvilinear capsulorrhexis was made with a cystotome and capsulorrhexis forceps.  Balanced salt solution was used to hydrodissect and hydrodelineate the nucleus.  Phacoemulsification was then used in stop and chop fashion to remove the lens nucleus and epinucleus.  The remaining cortex was then removed using the irrigation and aspiration handpiece. Provisc was then placed into the capsular bag to distend it for lens placement.  A ZCBOO, 24.0-diopter lens was then injected into the capsular bag.  The remaining viscoelastic was aspirated.  Wounds were hydrated with balanced salt solution.  The anterior chamber was inflated to a physiologic pressure with balanced salt solution.  0.1 mL of cefuroxime 10 mg/mL were injected into the anterior chamber for a dose of 1 mg of intracameral  antibiotic at the completion of the case. Miostat was placed into the anterior chamber to constrict the pupil.  No wound leaks were noted.  Topical Vigamox drops and Maxitrol ointment were applied to the eye.  The patient was taken to the recovery room in stable condition without complications of anesthesia or surgery.   ____________________________ Wyonia Hough, MD crb:LT D: 06/08/2014 14:28:30 ET T: 06/08/2014 17:03:16 ET JOB#: [id number]  cc: Wyonia Hough, MD, <Dictator>    Leandrew Koyanagi MD ELECTRONICALLY SIGNED 06/14/2014 21:48

## 2015-01-21 NOTE — Op Note (Signed)
PATIENT NAME:  Laura Bell, Laura Bell MR#:  [id number] DATE OF BIRTH:  31-Dec-1935  DATE OF PROCEDURE:  08/10/2014  PREOPERATIVE DIAGNOSIS:  Nuclear sclerotic cataract of the right eye.  ICD-10 H25.11  POSTOPERATIVE DIAGNOSIS:  Nuclear sclerotic cataract of the right eye.  PROCEDURE:  Phacoemulsification with posterior chamber intraocular lens implantation of the right eye.  LENS:  ZCB00 24.5-diopter posterior chamber intraocular lens.  ULTRASOUND TIME:  16% of 1 minute, 39 seconds.  CDE 15.7.    SURGEON:  Mali Jaivon Vanbeek, MD  ANESTHESIA:  Retrobulbar block of Xylocaine and Bupivacaine.  COMPLICATIONS:  None.  DESCRIPTION OF PROCEDURE:  The patient was identified in the holding room and transported to the operating room and placed in the supine position under the operating microscope.  The right eye was identified as the operative eye and a retrobulbar block was performed under intravenous sedation.  It was then prepped and draped in the usual sterile ophthalmic fashion.  A 1 millimeter clear-corneal paracentesis was made at the 12:00 position.  The anterior chamber was filled with Viscoat viscoelastic.  A 2.4 millimeter keratome was used to make a near-clear corneal incision at the 9:00 position.  A curvilinear capsulorrhexis was made with a cystotome and capsulorrhexis forceps.  Balanced salt solution was used to hydrodissect and hydrodelineate the nucleus.  Phacoemulsification was then used in stop and chop fashion to remove the lens nucleus and epinucleus.  The remaining cortex was then removed using the irrigation and aspiration handpiece.  Provisc was then placed into the capsular bag to distend it for lens placement.  A ZCB00 24.5 diopter lens was then injected into the capsular bag.  The remaining viscoelastic was aspirated.  Wounds were hydrated with balanced salt solution.  The anterior chamber was inflated to a physiologic pressure with balanced salt solution. 0.1 mL of cefuroxime 10  mg/mL were injected into the anterior chamber for a dose of 1 mg of intracameral antibiotic at the completion of the case.  Miostat was placed into the anterior chamber to constrict the pupil.  No wound leaks were noted.  Topical Vigamox drops and Maxitrol ointment were applied to the eye.  The eye was patched.  The patient was taken to the recovery room in stable condition without complications of anesthesia or surgery.   ____________________________ Wyonia Hough, MD crb:bu D: 08/10/2014 14:54:34 ET T: 08/10/2014 17:08:00 ET JOB#: [id number]  cc: Wyonia Hough, MD, <Dictator>     Leandrew Koyanagi MD ELECTRONICALLY SIGNED 08/17/2014 11:31

## 2015-01-23 ENCOUNTER — Encounter: Payer: Self-pay | Admitting: Oncology

## 2015-01-23 ENCOUNTER — Other Ambulatory Visit: Payer: Self-pay | Admitting: Oncology

## 2015-01-24 ENCOUNTER — Telehealth: Payer: Self-pay | Admitting: Family Medicine

## 2015-01-24 NOTE — Telephone Encounter (Signed)
Please ask if the drainage is clear/what color/ or any pus ? Also ask about redness or tenderness and let me know, or if fever .  It is not unusual to have some drainage. She should also call the breast center with concerns regarding the biopsy site

## 2015-01-24 NOTE — Telephone Encounter (Signed)
Please return pt call regarding her breast.  She got  a mammogram and biopsy and now has an area that is draining.  [phone].

## 2015-01-24 NOTE — Telephone Encounter (Signed)
No answer, Left detailed message on voicemail to return call.

## 2015-01-25 NOTE — Telephone Encounter (Signed)
Followed up with patient.  She states she has spoken to someone at the breast center.  It seems she was just having some irritation from the tape, they did not have any concerns.  She reports improvement this morning.  She will follow up as needed for this concern.

## 2015-01-30 ENCOUNTER — Ambulatory Visit (INDEPENDENT_AMBULATORY_CARE_PROVIDER_SITE_OTHER): Payer: Medicare Other | Admitting: Family Medicine

## 2015-01-30 ENCOUNTER — Encounter: Payer: Self-pay | Admitting: Family Medicine

## 2015-01-30 VITALS — BP 136/82 | HR 112 | Temp 98.1°F | Ht 62.0 in | Wt 142.2 lb

## 2015-01-30 DIAGNOSIS — N61 Inflammatory disorders of breast: Secondary | ICD-10-CM

## 2015-01-30 MED ORDER — DOXYCYCLINE HYCLATE 100 MG PO TABS: 100.0000 mg | ORAL_TABLET | Freq: Two times a day (BID) | ORAL | Status: DC

## 2015-01-30 NOTE — Patient Instructions (Signed)
Keep the breast area clean with soap and water  No adhesive bandages  Use antibiotic ointment over the counter  Take doxycycline as directed with non dairy food  If worse or increased redness or pain please update me and also the breast center  I think your sugar is up due to stress and infection - I expect that to improve but please let me know if it does not  Take care of yourself

## 2015-01-30 NOTE — Assessment & Plan Note (Signed)
Area of abraded skin around L nipple (under recent bx site) -resembles early cellulitis Cover with doxycycline  Soap and water/ gauze (no tape) and abx oint Watch for inc redness or streaking  F/u if no imp in 3-4 d or if worsening or if fever
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
## 2015-01-30 NOTE — Progress Notes (Signed)
Pre visit review using our clinic review tool, if applicable. No additional management support is needed unless otherwise documented below in the visit note. 

## 2015-02-14 DIAGNOSIS — N816 Rectocele: Secondary | ICD-10-CM | POA: Diagnosis not present

## 2015-02-14 DIAGNOSIS — R32 Unspecified urinary incontinence: Secondary | ICD-10-CM | POA: Diagnosis not present

## 2015-02-14 DIAGNOSIS — N952 Postmenopausal atrophic vaginitis: Secondary | ICD-10-CM | POA: Diagnosis not present

## 2015-02-21 ENCOUNTER — Encounter: Payer: Self-pay | Admitting: Family Medicine

## 2015-02-21 ENCOUNTER — Telehealth: Payer: Self-pay

## 2015-02-21 ENCOUNTER — Ambulatory Visit (INDEPENDENT_AMBULATORY_CARE_PROVIDER_SITE_OTHER): Payer: Medicare Other | Admitting: Family Medicine

## 2015-02-21 VITALS — BP 128/70 | HR 100 | Temp 97.8°F | Ht 62.0 in | Wt 142.2 lb

## 2015-02-21 DIAGNOSIS — N61 Inflammatory disorders of breast: Secondary | ICD-10-CM | POA: Diagnosis not present

## 2015-02-21 MED ORDER — DOXYCYCLINE HYCLATE 100 MG PO TABS: 100.0000 mg | ORAL_TABLET | Freq: Two times a day (BID) | ORAL | Status: DC

## 2015-02-21 NOTE — Telephone Encounter (Signed)
Left voicemail requesting daughter to call back but pt is scheduled for f/u appt today so if no response we can discuss it at her appt today
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
## 2015-02-21 NOTE — Assessment & Plan Note (Signed)
2nd occurrence since her neg bx in 4/16  Will treat again with doxycycline Disc care- soap and water to keep area clean and dry Pt will update if increased redness/swelling or drainage Also if pain or fever  Would consider f/u at the breast center if necessary  Not due for mammogram until fall

## 2015-02-21 NOTE — Telephone Encounter (Signed)
Laura Bell pts daughter left v/m; Laura Bell spoke with [facility] needs referral from Dr Glori Bickers before an appt could be scheduled for pt to be re evaluated for continuing breast problem since lt breast biopsy on 01/18/15. Pt has already seen Dr Glori Bickers on 01/30/15 for this. Carol request cb to pt.Please advise.

## 2015-02-21 NOTE — Patient Instructions (Signed)
Keep the breast clean with soap and water and dry  I think you may have another mild infection  Take they doxycycline as directed  If redness or pain worsens please let me know - we may want to get the breast center involved  We will re check this in June at your next appointment

## 2015-02-21 NOTE — Telephone Encounter (Signed)
What is it that she needs exactly- ? A diagnostic mammogram order?  It looks like there is one from Dr Jana Hakim in the computer but I do not know if it is still active  Please let me know what I need to order

## 2015-02-21 NOTE — Progress Notes (Signed)
Pre visit review using our clinic review tool, if applicable. No additional management support is needed unless otherwise documented below in the visit note. 

## 2015-03-01 ENCOUNTER — Other Ambulatory Visit (INDEPENDENT_AMBULATORY_CARE_PROVIDER_SITE_OTHER): Payer: Medicare Other

## 2015-03-01 DIAGNOSIS — E1165 Type 2 diabetes mellitus with hyperglycemia: Secondary | ICD-10-CM | POA: Diagnosis not present

## 2015-03-01 DIAGNOSIS — IMO0002 Reserved for concepts with insufficient information to code with codable children: Secondary | ICD-10-CM

## 2015-03-01 LAB — LIPID PANEL
CHOL/HDL RATIO: 3
CHOLESTEROL: 191 mg/dL (ref 0–200)
HDL: 61.5 mg/dL (ref >39.00)
LDL Cholesterol: 111 mg/dL — ABNORMAL HIGH (ref 0–99)
NonHDL: 129.5
TRIGLYCERIDES: 92 mg/dL (ref 0.0–149.0)
VLDL: 18.4 mg/dL (ref 0.0–40.0)

## 2015-03-01 LAB — COMPREHENSIVE METABOLIC PANEL
ALK PHOS: 46 U/L (ref 39–117)
ALT: 16 U/L (ref 0–35)
AST: 15 U/L (ref 0–37)
Albumin: 3.9 g/dL (ref 3.5–5.2)
BUN: 20 mg/dL (ref 6–23)
CO2: 25 meq/L (ref 19–32)
Calcium: 9.3 mg/dL (ref 8.4–10.5)
Chloride: 104 mEq/L (ref 96–112)
Creatinine, Ser: 0.71 mg/dL (ref 0.40–1.20)
GFR: 84.87 mL/min (ref >60.00)
Glucose, Bld: 161 mg/dL — ABNORMAL HIGH (ref 70–99)
POTASSIUM: 3.9 meq/L (ref 3.5–5.1)
SODIUM: 136 meq/L (ref 135–145)
Total Bilirubin: 0.4 mg/dL (ref 0.2–1.2)
Total Protein: 6.6 g/dL (ref 6.0–8.3)

## 2015-03-01 LAB — HEMOGLOBIN A1C: Hgb A1c MFr Bld: 8.7 % — ABNORMAL HIGH (ref 4.6–6.5)

## 2015-03-03 ENCOUNTER — Ambulatory Visit (INDEPENDENT_AMBULATORY_CARE_PROVIDER_SITE_OTHER): Payer: Medicare Other | Admitting: Family Medicine

## 2015-03-03 ENCOUNTER — Encounter: Payer: Self-pay | Admitting: Family Medicine

## 2015-03-03 VITALS — BP 126/72 | HR 96 | Temp 98.0°F | Ht 62.0 in | Wt 138.5 lb

## 2015-03-03 DIAGNOSIS — I10 Essential (primary) hypertension: Secondary | ICD-10-CM | POA: Diagnosis not present

## 2015-03-03 DIAGNOSIS — E782 Mixed hyperlipidemia: Secondary | ICD-10-CM

## 2015-03-03 DIAGNOSIS — IMO0002 Reserved for concepts with insufficient information to code with codable children: Secondary | ICD-10-CM

## 2015-03-03 DIAGNOSIS — N61 Inflammatory disorders of breast: Secondary | ICD-10-CM | POA: Diagnosis not present

## 2015-03-03 DIAGNOSIS — E1165 Type 2 diabetes mellitus with hyperglycemia: Secondary | ICD-10-CM | POA: Diagnosis not present

## 2015-03-03 NOTE — Patient Instructions (Signed)
Keep working on healthy diet and get back to walking when you can Stop at check out for referral to endocrinologist for diabetes  Breast area looks better - update me if it does not continue to improve -and see Dr Marlou Starks as planned in July

## 2015-03-03 NOTE — Progress Notes (Signed)
Pre visit review using our clinic review tool, if applicable. No additional management support is needed unless otherwise documented below in the visit note. 
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
## 2015-03-05 NOTE — Assessment & Plan Note (Signed)
Improving with doxycycline- area is peeling now  Will watch closely F/u with Dr Marlou Starks in July- or earlier if needed for re check  Will update if changes

## 2015-03-05 NOTE — Assessment & Plan Note (Signed)
Disc goals for lipids and reasons to control them Rev labs with pt Rev low sat fat diet in detail Both HDL and LDL went up this draw  LDL not at goal  Will continue simvastatin and work harder on diet

## 2015-03-05 NOTE — Assessment & Plan Note (Signed)
Lab Results  Component Value Date   HGBA1C 8.7* 03/01/2015   Not improved  Pt does not want to take jardiance Working on diet and exercise Will refer to endocrinology for further eval

## 2015-03-05 NOTE — Assessment & Plan Note (Signed)
bp in fair control at this time  BP Readings from Last 1 Encounters:  03/03/15 126/72   No changes needed Disc lifstyle change with low sodium diet and exercise  Labs reviewed

## 2015-03-13 ENCOUNTER — Telehealth: Payer: Self-pay | Admitting: Urology

## 2015-03-13 NOTE — Telephone Encounter (Signed)
Spoke with pt who stated he was under the impression she was getting a referral to [facility]. Milbert Coulter pt information. Made pt aware Lattie Haw will be giving her a call asap. Pt voiced understanding. Cw,lpn

## 2015-03-13 NOTE — Telephone Encounter (Signed)
Pt called and left nothing but her name and phone number to contact her. # [phone] 03/13/15 MAF

## 2015-03-27 DIAGNOSIS — N815 Vaginal enterocele: Secondary | ICD-10-CM | POA: Diagnosis not present

## 2015-03-27 DIAGNOSIS — N816 Rectocele: Secondary | ICD-10-CM | POA: Diagnosis not present

## 2015-03-27 DIAGNOSIS — R350 Frequency of micturition: Secondary | ICD-10-CM | POA: Diagnosis not present

## 2015-03-27 DIAGNOSIS — N3946 Mixed incontinence: Secondary | ICD-10-CM | POA: Diagnosis not present

## 2015-03-29 ENCOUNTER — Ambulatory Visit: Payer: Medicare Other | Admitting: Endocrinology

## 2015-04-07 ENCOUNTER — Encounter: Payer: Self-pay | Admitting: Family Medicine

## 2015-04-07 DIAGNOSIS — E119 Type 2 diabetes mellitus without complications: Secondary | ICD-10-CM | POA: Diagnosis not present

## 2015-04-07 LAB — HM DIABETES EYE EXAM

## 2015-04-10 DIAGNOSIS — D0512 Intraductal carcinoma in situ of left breast: Secondary | ICD-10-CM | POA: Diagnosis not present

## 2015-04-19 DIAGNOSIS — N3946 Mixed incontinence: Secondary | ICD-10-CM | POA: Diagnosis not present

## 2015-04-19 DIAGNOSIS — R350 Frequency of micturition: Secondary | ICD-10-CM | POA: Diagnosis not present

## 2015-04-26 DIAGNOSIS — N3946 Mixed incontinence: Secondary | ICD-10-CM | POA: Diagnosis not present

## 2015-05-02 DIAGNOSIS — D0512 Intraductal carcinoma in situ of left breast: Secondary | ICD-10-CM | POA: Diagnosis not present

## 2015-05-05 ENCOUNTER — Other Ambulatory Visit: Payer: Self-pay | Admitting: General Surgery

## 2015-05-05 DIAGNOSIS — D0512 Intraductal carcinoma in situ of left breast: Secondary | ICD-10-CM | POA: Diagnosis not present

## 2015-05-05 DIAGNOSIS — L408 Other psoriasis: Secondary | ICD-10-CM | POA: Diagnosis not present

## 2015-05-29 ENCOUNTER — Telehealth: Payer: Self-pay | Admitting: Family Medicine

## 2015-05-29 DIAGNOSIS — IMO0002 Reserved for concepts with insufficient information to code with codable children: Secondary | ICD-10-CM

## 2015-05-29 DIAGNOSIS — I10 Essential (primary) hypertension: Secondary | ICD-10-CM

## 2015-05-29 DIAGNOSIS — E1165 Type 2 diabetes mellitus with hyperglycemia: Secondary | ICD-10-CM

## 2015-05-29 DIAGNOSIS — E782 Mixed hyperlipidemia: Secondary | ICD-10-CM

## 2015-05-29 NOTE — Telephone Encounter (Signed)
-----   Message from Marchia Bond sent at 05/29/2015  2:51 PM EDT ----- Regarding: fu labs 9/1, need orders thanks! :-) Please order future f/u labs for pt's upcoming lab appt. Thanks Aniceto Boss

## 2015-05-30 DIAGNOSIS — E1165 Type 2 diabetes mellitus with hyperglycemia: Secondary | ICD-10-CM | POA: Diagnosis not present

## 2015-06-01 ENCOUNTER — Other Ambulatory Visit (INDEPENDENT_AMBULATORY_CARE_PROVIDER_SITE_OTHER): Payer: Medicare Other

## 2015-06-01 DIAGNOSIS — E782 Mixed hyperlipidemia: Secondary | ICD-10-CM | POA: Diagnosis not present

## 2015-06-01 DIAGNOSIS — E1165 Type 2 diabetes mellitus with hyperglycemia: Secondary | ICD-10-CM

## 2015-06-01 DIAGNOSIS — I10 Essential (primary) hypertension: Secondary | ICD-10-CM

## 2015-06-01 DIAGNOSIS — IMO0002 Reserved for concepts with insufficient information to code with codable children: Secondary | ICD-10-CM

## 2015-06-01 LAB — COMPREHENSIVE METABOLIC PANEL
ALBUMIN: 3.8 g/dL (ref 3.5–5.2)
ALT: 14 U/L (ref 0–35)
AST: 14 U/L (ref 0–37)
Alkaline Phosphatase: 49 U/L (ref 39–117)
BILIRUBIN TOTAL: 0.3 mg/dL (ref 0.2–1.2)
BUN: 9 mg/dL (ref 6–23)
CALCIUM: 9.6 mg/dL (ref 8.4–10.5)
CO2: 26 mEq/L (ref 19–32)
CREATININE: 0.63 mg/dL (ref 0.40–1.20)
Chloride: 101 mEq/L (ref 96–112)
GFR: 97.36 mL/min (ref >60.00)
Glucose, Bld: 250 mg/dL — ABNORMAL HIGH (ref 70–99)
Potassium: 4.5 mEq/L (ref 3.5–5.1)
Sodium: 135 mEq/L (ref 135–145)
Total Protein: 6.6 g/dL (ref 6.0–8.3)

## 2015-06-01 LAB — LIPID PANEL
CHOLESTEROL: 137 mg/dL (ref 0–200)
HDL: 48.4 mg/dL (ref >39.00)
LDL Cholesterol: 61 mg/dL (ref 0–99)
NonHDL: 88.69
TRIGLYCERIDES: 139 mg/dL (ref 0.0–149.0)
Total CHOL/HDL Ratio: 3
VLDL: 27.8 mg/dL (ref 0.0–40.0)

## 2015-06-01 LAB — HEMOGLOBIN A1C: Hgb A1c MFr Bld: 9.3 % — ABNORMAL HIGH (ref 4.6–6.5)

## 2015-06-06 ENCOUNTER — Encounter: Payer: Self-pay | Admitting: Family Medicine

## 2015-06-06 ENCOUNTER — Ambulatory Visit (INDEPENDENT_AMBULATORY_CARE_PROVIDER_SITE_OTHER): Payer: Medicare Other | Admitting: Family Medicine

## 2015-06-06 VITALS — BP 160/88 | HR 124 | Temp 97.5°F | Ht 62.0 in | Wt 139.2 lb

## 2015-06-06 DIAGNOSIS — E782 Mixed hyperlipidemia: Secondary | ICD-10-CM | POA: Diagnosis not present

## 2015-06-06 DIAGNOSIS — E1165 Type 2 diabetes mellitus with hyperglycemia: Secondary | ICD-10-CM | POA: Diagnosis not present

## 2015-06-06 DIAGNOSIS — J302 Other seasonal allergic rhinitis: Secondary | ICD-10-CM | POA: Diagnosis not present

## 2015-06-06 DIAGNOSIS — I10 Essential (primary) hypertension: Secondary | ICD-10-CM | POA: Diagnosis not present

## 2015-06-06 DIAGNOSIS — IMO0002 Reserved for concepts with insufficient information to code with codable children: Secondary | ICD-10-CM

## 2015-06-06 DIAGNOSIS — R059 Cough, unspecified: Secondary | ICD-10-CM | POA: Insufficient documentation

## 2015-06-06 DIAGNOSIS — R05 Cough: Secondary | ICD-10-CM

## 2015-06-06 MED ORDER — BENZONATATE 200 MG PO CAPS: 200.0000 mg | ORAL_CAPSULE | Freq: Three times a day (TID) | ORAL | Status: DC | PRN
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
## 2015-06-06 NOTE — Progress Notes (Signed)
Pre visit review using our clinic review tool, if applicable. No additional management support is needed unless otherwise documented below in the visit note. 

## 2015-06-06 NOTE — Assessment & Plan Note (Signed)
With cough and nasal symptoms  Will begin claritin 10 mg daily -has helped in the past  Disc allergen avoidance

## 2015-06-06 NOTE — Assessment & Plan Note (Signed)
Diff incl viral/allergic or ace cough (reassuring exam)  Trial of claritin  Also tessalon pills   If no imp consider trial of holding ace Update if not starting to improve in a week or if worsening

## 2015-06-06 NOTE — Patient Instructions (Signed)
Continue follow up with Dr Chalmers Cater  Cough may be from allergies or a chest cold (less likely from your blood pressure medicine as a side effect) Take claritin over the counter one daily (should help nasal symptoms also)  Try tessalon for cough as directed  If cough worsens or you develop new symptoms or if not improving in 1-2 weeks let me know   Follow up with me in 4-6 weeks for your blood pressure (take your medicine that day)

## 2015-06-06 NOTE — Assessment & Plan Note (Signed)
Disc goals for lipids and reasons to control them Rev labs with pt Rev low sat fat diet in detail  Well controlled with statin and diet  

## 2015-06-06 NOTE — Assessment & Plan Note (Addendum)
Pt missed bp med today  Will return for re check 4-6 wk  Has prev been well controlled  No symptoms

## 2015-06-19 DIAGNOSIS — N3946 Mixed incontinence: Secondary | ICD-10-CM | POA: Diagnosis not present

## 2015-06-19 DIAGNOSIS — N816 Rectocele: Secondary | ICD-10-CM | POA: Diagnosis not present

## 2015-07-18 ENCOUNTER — Ambulatory Visit (INDEPENDENT_AMBULATORY_CARE_PROVIDER_SITE_OTHER): Payer: Medicare Other | Admitting: Family Medicine

## 2015-07-18 ENCOUNTER — Encounter: Payer: Self-pay | Admitting: Family Medicine

## 2015-07-18 VITALS — BP 130/80 | HR 90 | Temp 97.9°F | Ht 62.0 in | Wt 141.0 lb

## 2015-07-18 DIAGNOSIS — Z23 Encounter for immunization: Secondary | ICD-10-CM

## 2015-07-18 DIAGNOSIS — I10 Essential (primary) hypertension: Secondary | ICD-10-CM
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
## 2015-07-18 NOTE — Progress Notes (Signed)
Pre visit review using our clinic review tool, if applicable. No additional management support is needed unless otherwise documented below in the visit note. 

## 2015-07-18 NOTE — Patient Instructions (Addendum)
Flu shot today  BP is better today at 130/80  Eat a low sugar and low sodium diet   Take care of yourself

## 2015-07-19 ENCOUNTER — Other Ambulatory Visit: Payer: Self-pay | Admitting: Family Medicine

## 2015-07-19 NOTE — Assessment & Plan Note (Signed)
Improvement now that she has taken her bp medicine  BP Readings from Last 3 Encounters:  07/18/15 130/80  06/06/15 160/88  03/03/15 126/72    Will continue to watch at home - where it is even better

## 2015-08-01 DIAGNOSIS — H0016 Chalazion left eye, unspecified eyelid: Secondary | ICD-10-CM | POA: Diagnosis not present

## 2015-08-07 ENCOUNTER — Other Ambulatory Visit: Payer: Self-pay | Admitting: Oncology

## 2015-08-07 DIAGNOSIS — R921 Mammographic calcification found on diagnostic imaging of breast: Secondary | ICD-10-CM

## 2015-08-15 DIAGNOSIS — D0512 Intraductal carcinoma in situ of left breast: Secondary | ICD-10-CM | POA: Diagnosis not present

## 2015-08-29 DIAGNOSIS — E1165 Type 2 diabetes mellitus with hyperglycemia: Secondary | ICD-10-CM | POA: Diagnosis not present

## 2015-09-05 DIAGNOSIS — E119 Type 2 diabetes mellitus without complications: Secondary | ICD-10-CM | POA: Diagnosis not present

## 2015-09-05 DIAGNOSIS — E1165 Type 2 diabetes mellitus with hyperglycemia: Secondary | ICD-10-CM | POA: Diagnosis not present

## 2015-09-15 ENCOUNTER — Ambulatory Visit
Admission: RE | Admit: 2015-09-15 | Discharge: 2015-09-15 | Disposition: A | Discharge status: Home / Self Care | Payer: Medicare Other | Source: Ambulatory Visit | Attending: Oncology | Admitting: Oncology

## 2015-09-15 DIAGNOSIS — R921 Mammographic calcification found on diagnostic imaging of breast: Secondary | ICD-10-CM | POA: Diagnosis not present

## 2015-09-28 ENCOUNTER — Encounter: Payer: Self-pay | Admitting: *Deleted

## 2015-10-18 ENCOUNTER — Ambulatory Visit (INDEPENDENT_AMBULATORY_CARE_PROVIDER_SITE_OTHER): Payer: Medicare Other | Admitting: Family Medicine

## 2015-10-18 ENCOUNTER — Encounter: Payer: Self-pay | Admitting: Family Medicine

## 2015-10-18 VITALS — BP 129/80 | HR 95 | Temp 97.4°F | Ht 62.0 in | Wt 144.8 lb

## 2015-10-18 DIAGNOSIS — R300 Dysuria: Secondary | ICD-10-CM

## 2015-10-18 DIAGNOSIS — E782 Mixed hyperlipidemia: Secondary | ICD-10-CM | POA: Diagnosis not present

## 2015-10-18 DIAGNOSIS — I10 Essential (primary) hypertension: Secondary | ICD-10-CM | POA: Diagnosis not present

## 2015-10-18 DIAGNOSIS — K219 Gastro-esophageal reflux disease without esophagitis: Secondary | ICD-10-CM | POA: Diagnosis not present

## 2015-10-18 LAB — POC URINALSYSI DIPSTICK (AUTOMATED)
BILIRUBIN UA: NEGATIVE
Blood, UA: NEGATIVE
KETONES UA: NEGATIVE
Leukocytes, UA: NEGATIVE
Nitrite, UA: NEGATIVE
PROTEIN UA: NEGATIVE
SPEC GRAV UA: 1.01
Urobilinogen, UA: 0.2
pH, UA: 6

## 2015-10-18 NOTE — Assessment & Plan Note (Signed)
Pt thinks omeprazole is causing itching - enc her to stop it (doing ok w/o it now) Will try ranitidine prn and update  Also watch diet

## 2015-10-18 NOTE — Patient Instructions (Signed)
Stop omeprazole since it may be causing you to itch You can try zantac over the counter instead if needed  Give a urine sample on the way out  Stop the vesicare if it does not work  Blood pressure is improved on 2nd check   Follow up in about 6 months for annual exam with labs prior

## 2015-10-18 NOTE — Progress Notes (Signed)
Pre visit review using our clinic review tool, if applicable. No additional management support is needed unless otherwise documented below in the visit note. 
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
## 2015-10-18 NOTE — Assessment & Plan Note (Signed)
ua is clear  Enc fluids She is going to stop vesicare due to not helping  Enc f/u with her urologist

## 2015-10-18 NOTE — Assessment & Plan Note (Signed)
bp in fair control at this time  BP Readings from Last 1 Encounters:  10/18/15 129/80   No changes needed Disc lifstyle change with low sodium diet and exercise  Last labs reviewed F/u 6 mo

## 2015-10-18 NOTE — Assessment & Plan Note (Signed)
Controlled on zocor and diet  Enc low sat fat diet

## 2015-10-20 ENCOUNTER — Telehealth: Payer: Self-pay | Admitting: Family Medicine

## 2015-10-20 NOTE — Telephone Encounter (Signed)
Pt stated she called [facility] They told her they needed a faxed results of her urine sample Sent to dr Lenna Sciara soluman @ [facility]

## 2015-10-23 NOTE — Telephone Encounter (Signed)
Copy of UA sent to Dr. Gabriel Carina ([facility])

## 2015-11-13 ENCOUNTER — Encounter: Payer: Self-pay | Admitting: Nurse Practitioner

## 2015-11-13 ENCOUNTER — Ambulatory Visit (HOSPITAL_BASED_OUTPATIENT_CLINIC_OR_DEPARTMENT_OTHER): Payer: Medicare Other | Admitting: Nurse Practitioner

## 2015-11-13 ENCOUNTER — Other Ambulatory Visit: Payer: Self-pay | Admitting: *Deleted

## 2015-11-13 ENCOUNTER — Telehealth: Payer: Self-pay | Admitting: Oncology

## 2015-11-13 VITALS — BP 143/69 | HR 112 | Temp 97.5°F | Resp 17 | Ht 62.0 in | Wt 145.5 lb

## 2015-11-13 DIAGNOSIS — Z170 Estrogen receptor positive status [ER+]: Secondary | ICD-10-CM | POA: Diagnosis not present

## 2015-11-13 DIAGNOSIS — C50512 Malignant neoplasm of lower-outer quadrant of left female breast: Secondary | ICD-10-CM

## 2015-11-13 DIAGNOSIS — D0512 Intraductal carcinoma in situ of left breast: Secondary | ICD-10-CM | POA: Diagnosis not present

## 2015-11-13 DIAGNOSIS — Z79810 Long term (current) use of selective estrogen receptor modulators (SERMs): Secondary | ICD-10-CM

## 2015-11-13 MED ORDER — TAMOXIFEN CITRATE 20 MG PO TABS: 20.0000 mg | ORAL_TABLET | Freq: Every day | ORAL | Status: DC
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
## 2015-11-13 NOTE — Telephone Encounter (Signed)
Appointments made and avs printed °

## 2015-11-14 ENCOUNTER — Ambulatory Visit (INDEPENDENT_AMBULATORY_CARE_PROVIDER_SITE_OTHER): Payer: Medicare Other | Admitting: Family Medicine

## 2015-11-14 ENCOUNTER — Encounter: Payer: Self-pay | Admitting: Family Medicine

## 2015-11-14 VITALS — BP 130/70 | HR 107 | Temp 97.9°F | Ht 62.0 in | Wt 145.5 lb

## 2015-11-14 DIAGNOSIS — L309 Dermatitis, unspecified: Secondary | ICD-10-CM

## 2015-11-14 DIAGNOSIS — B373 Candidiasis of vulva and vagina: Secondary | ICD-10-CM | POA: Diagnosis not present

## 2015-11-14 DIAGNOSIS — I10 Essential (primary) hypertension: Secondary | ICD-10-CM | POA: Diagnosis not present

## 2015-11-14 DIAGNOSIS — B3731 Acute candidiasis of vulva and vagina: Secondary | ICD-10-CM

## 2015-11-14 LAB — POCT WET PREP (WET MOUNT): KOH Wet Prep POC: POSITIVE

## 2015-11-14 MED ORDER — TERCONAZOLE 0.8 % VA CREA: TOPICAL_CREAM | VAGINAL | Status: DC

## 2015-11-14 MED ORDER — FLUCONAZOLE 150 MG PO TABS: 150.0000 mg | ORAL_TABLET | Freq: Once | ORAL | Status: DC

## 2015-11-14 MED ORDER — MOMETASONE FUROATE 0.1 % EX CREA: 1.0000 "application " | TOPICAL_CREAM | Freq: Every day | CUTANEOUS | Status: DC | PRN

## 2015-11-14 NOTE — Patient Instructions (Signed)
Take the diflucan orally one day for yeast (skip zocor that day)  Use the terazol cream -on outer vaginal areas that itch   For eczema on hands - mometasone cream once daily only as needed Continue using moisturizers  Avoid hot water   Update if not starting to improve in a week or if worsening

## 2015-11-14 NOTE — Progress Notes (Signed)
Pre visit review using our clinic review tool, if applicable. No additional management support is needed unless otherwise documented below in the visit note. 

## 2015-11-14 NOTE — Assessment & Plan Note (Signed)
bp better on re check BP: 130/70 mmHg

## 2015-11-14 NOTE — Assessment & Plan Note (Signed)
tx with diflucan times one terazol cream for external use  Stay as dry as possible Update if not starting to improve in a week or if worsening
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
## 2015-11-27 ENCOUNTER — Other Ambulatory Visit: Payer: Self-pay | Admitting: *Deleted

## 2015-11-27 MED ORDER — GLUCOSE BLOOD VI STRP: ORAL_STRIP | Status: DC

## 2015-11-27 NOTE — Telephone Encounter (Signed)
Received fax saying pt needs new Rx, every 6 months per medicare requirement, done

## 2015-12-01 DIAGNOSIS — E119 Type 2 diabetes mellitus without complications: Secondary | ICD-10-CM | POA: Diagnosis not present

## 2015-12-11 DIAGNOSIS — E1165 Type 2 diabetes mellitus with hyperglycemia: Secondary | ICD-10-CM | POA: Diagnosis not present

## 2015-12-13 ENCOUNTER — Ambulatory Visit
Admission: RE | Admit: 2015-12-13 | Discharge: 2015-12-13 | Disposition: A | Discharge status: Home / Self Care | Payer: Medicare HMO | Source: Ambulatory Visit | Attending: Oncology | Admitting: Oncology

## 2015-12-13 DIAGNOSIS — C50512 Malignant neoplasm of lower-outer quadrant of left female breast: Secondary | ICD-10-CM

## 2015-12-13 DIAGNOSIS — R928 Other abnormal and inconclusive findings on diagnostic imaging of breast: Secondary | ICD-10-CM | POA: Diagnosis not present

## 2015-12-13 DIAGNOSIS — E1165 Type 2 diabetes mellitus with hyperglycemia: Secondary | ICD-10-CM

## 2015-12-13 DIAGNOSIS — E782 Mixed hyperlipidemia: Secondary | ICD-10-CM

## 2015-12-13 DIAGNOSIS — IMO0002 Reserved for concepts with insufficient information to code with codable children: Secondary | ICD-10-CM

## 2015-12-30 ENCOUNTER — Other Ambulatory Visit: Payer: Self-pay | Admitting: Family Medicine

## 2016-01-01 DIAGNOSIS — R69 Illness, unspecified: Secondary | ICD-10-CM | POA: Diagnosis not present

## 2016-01-10 ENCOUNTER — Other Ambulatory Visit: Payer: Self-pay | Admitting: Family Medicine

## 2016-01-12 DIAGNOSIS — R69 Illness, unspecified: Secondary | ICD-10-CM | POA: Diagnosis not present

## 2016-01-16 ENCOUNTER — Ambulatory Visit: Payer: Medicare Other | Admitting: Family Medicine

## 2016-01-31 DIAGNOSIS — D0512 Intraductal carcinoma in situ of left breast: Secondary | ICD-10-CM | POA: Diagnosis not present

## 2016-02-21 DIAGNOSIS — R69 Illness, unspecified: Secondary | ICD-10-CM | POA: Diagnosis not present

## 2016-03-03 DIAGNOSIS — R69 Illness, unspecified: Secondary | ICD-10-CM | POA: Diagnosis not present

## 2016-03-11 DIAGNOSIS — E1165 Type 2 diabetes mellitus with hyperglycemia: Secondary | ICD-10-CM | POA: Diagnosis not present

## 2016-03-12 DIAGNOSIS — H01113 Allergic dermatitis of right eye, unspecified eyelid: Secondary | ICD-10-CM | POA: Diagnosis not present

## 2016-03-18 DIAGNOSIS — E1165 Type 2 diabetes mellitus with hyperglycemia: Secondary | ICD-10-CM | POA: Diagnosis not present

## 2016-04-13 ENCOUNTER — Other Ambulatory Visit: Payer: Self-pay | Admitting: Family Medicine

## 2016-04-15 DIAGNOSIS — R69 Illness, unspecified: Secondary | ICD-10-CM | POA: Diagnosis not present

## 2016-04-20 DIAGNOSIS — R69 Illness, unspecified: Secondary | ICD-10-CM | POA: Diagnosis not present

## 2016-04-22 ENCOUNTER — Ambulatory Visit (INDEPENDENT_AMBULATORY_CARE_PROVIDER_SITE_OTHER): Payer: Medicare HMO

## 2016-04-22 ENCOUNTER — Encounter: Payer: Self-pay | Admitting: Family Medicine

## 2016-04-22 ENCOUNTER — Ambulatory Visit (INDEPENDENT_AMBULATORY_CARE_PROVIDER_SITE_OTHER): Payer: Medicare HMO | Admitting: Family Medicine

## 2016-04-22 VITALS — BP 136/80 | HR 83 | Temp 98.8°F | Ht 61.5 in | Wt 141.8 lb

## 2016-04-22 VITALS — BP 128/80 | HR 83 | Temp 98.8°F | Ht 61.5 in | Wt 141.8 lb

## 2016-04-22 DIAGNOSIS — I10 Essential (primary) hypertension: Secondary | ICD-10-CM | POA: Diagnosis not present

## 2016-04-22 DIAGNOSIS — E782 Mixed hyperlipidemia: Secondary | ICD-10-CM

## 2016-04-22 DIAGNOSIS — C50512 Malignant neoplasm of lower-outer quadrant of left female breast: Secondary | ICD-10-CM

## 2016-04-22 DIAGNOSIS — Z23 Encounter for immunization: Secondary | ICD-10-CM | POA: Diagnosis not present

## 2016-04-22 DIAGNOSIS — K219 Gastro-esophageal reflux disease without esophagitis: Secondary | ICD-10-CM

## 2016-04-22 DIAGNOSIS — Z0000 Encounter for general adult medical examination without abnormal findings: Secondary | ICD-10-CM

## 2016-04-22 DIAGNOSIS — M8580 Other specified disorders of bone density and structure, unspecified site: Secondary | ICD-10-CM

## 2016-04-22 DIAGNOSIS — E2839 Other primary ovarian failure: Secondary | ICD-10-CM | POA: Insufficient documentation

## 2016-04-22 LAB — CBC WITH DIFFERENTIAL/PLATELET
BASOS PCT: 0.3 % (ref 0.0–3.0)
Basophils Absolute: 0 10*3/uL (ref 0.0–0.1)
EOS ABS: 0.2 10*3/uL (ref 0.0–0.7)
EOS PCT: 1.8 % (ref 0.0–5.0)
HEMATOCRIT: 42.2 % (ref 36.0–46.0)
HEMOGLOBIN: 13.9 g/dL (ref 12.0–15.0)
LYMPHS PCT: 25 % (ref 12.0–46.0)
Lymphs Abs: 2.3 10*3/uL (ref 0.7–4.0)
MCHC: 33 g/dL (ref 30.0–36.0)
MCV: 89.2 fl (ref 78.0–100.0)
Monocytes Absolute: 0.6 10*3/uL (ref 0.1–1.0)
Monocytes Relative: 6.4 % (ref 3.0–12.0)
Neutro Abs: 6.1 10*3/uL (ref 1.4–7.7)
Neutrophils Relative %: 66.5 % (ref 43.0–77.0)
Platelets: 292 10*3/uL (ref 150.0–400.0)
RBC: 4.73 Mil/uL (ref 3.87–5.11)
RDW: 13.5 % (ref 11.5–15.5)
WBC: 9.2 10*3/uL (ref 4.0–10.5)

## 2016-04-22 LAB — LIPID PANEL
CHOLESTEROL: 156 mg/dL (ref 0–200)
HDL: 61.5 mg/dL (ref >39.00)
LDL Cholesterol: 69 mg/dL (ref 0–99)
NONHDL: 94.1
Total CHOL/HDL Ratio: 3
Triglycerides: 124 mg/dL (ref 0.0–149.0)
VLDL: 24.8 mg/dL (ref 0.0–40.0)

## 2016-04-22 LAB — COMPREHENSIVE METABOLIC PANEL
ALBUMIN: 4.3 g/dL (ref 3.5–5.2)
ALK PHOS: 49 U/L (ref 39–117)
ALT: 16 U/L (ref 0–35)
AST: 14 U/L (ref 0–37)
BUN: 6 mg/dL (ref 6–23)
CALCIUM: 10.3 mg/dL (ref 8.4–10.5)
CHLORIDE: 100 meq/L (ref 96–112)
CO2: 27 mEq/L (ref 19–32)
CREATININE: 0.63 mg/dL (ref 0.40–1.20)
GFR: 97.13 mL/min (ref >60.00)
Glucose, Bld: 132 mg/dL — ABNORMAL HIGH (ref 70–99)
Potassium: 3.8 mEq/L (ref 3.5–5.1)
SODIUM: 136 meq/L (ref 135–145)
TOTAL PROTEIN: 7 g/dL (ref 6.0–8.3)
Total Bilirubin: 0.3 mg/dL (ref 0.2–1.2)

## 2016-04-22 LAB — TSH: TSH: 3.35 u[IU]/mL (ref 0.35–4.50)

## 2016-04-22 MED ORDER — RANITIDINE HCL 150 MG PO TABS: 150.0000 mg | ORAL_TABLET | Freq: Two times a day (BID) | ORAL | 11 refills | Status: DC

## 2016-04-22 MED ORDER — LISINOPRIL 40 MG PO TABS: 40.0000 mg | ORAL_TABLET | Freq: Every day | ORAL | 3 refills | Status: DC
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
## 2016-04-22 NOTE — Patient Instructions (Addendum)
Try to get 1200-1500 mg of calcium per day with at least 1000 iu of vitamin D - for bone health  Try zantac as needed for heartburn (if this does not help please let me know) Get your eye exam next month as planned  We will sign you up for the cologuard program for colon cancer screening  If you are interested in a shingles/zoster vaccine - call your insurance to check on coverage,( you should not get it within 1 month of other vaccines) , then call us for a prescription  for it to take to a pharmacy that gives the shot , or make a nurse visit to get it here depending on your coverage Labs today

## 2016-04-22 NOTE — Patient Instructions (Signed)
Laura Bell , Thank you for taking time to come for your Medicare Wellness Visit. I appreciate your ongoing commitment to your health goals. Please review the following plan we discussed and let me know if I can assist you in the future.   These are the goals we discussed: Goals    . Increase physical activity          Starting 04/22/2016, I will continue to walk at least 15 min daily.        This is a list of the screening recommended for you and due dates:  Health Maintenance  Topic Date Due  . Complete foot exam   04/22/2016*  . Eye exam for diabetics  05/30/2016*  . Shingles Vaccine  11/01/2020*  . Flu Shot  04/30/2016  . Hemoglobin A1C  09/30/2016  . Mammogram  12/12/2016  . Tetanus Vaccine  04/11/2021  . DEXA scan (bone density measurement)  Completed  . Pneumonia vaccines  Completed  *Topic was postponed. The date shown is not the original due date.   Preventive Care for Adults  A healthy lifestyle and preventive care can promote health and wellness. Preventive health guidelines for adults include the following key practices.  . A routine yearly physical is a good way to check with your health care provider about your health and preventive screening. It is a chance to share any concerns and updates on your health and to receive a thorough exam.  . Visit your dentist for a routine exam and preventive care every 6 months. Brush your teeth twice a day and floss once a day. Good oral hygiene prevents tooth decay and gum disease.  . The frequency of eye exams is based on your age, health, family medical history, use  of contact lenses, and other factors. Follow your health care provider's ecommendations for frequency of eye exams.  . Eat a healthy diet. Foods like vegetables, fruits, whole grains, low-fat dairy products, and lean protein foods contain the nutrients you need without too many calories. Decrease your intake of foods high in solid fats, added sugars, and salt. Eat  the right amount of calories for you. Get information about a proper diet from your health care provider, if necessary.  . Regular physical exercise is one of the most important things you can do for your health. Most adults should get at least 150 minutes of moderate-intensity exercise (any activity that increases your heart rate and causes you to sweat) each week. In addition, most adults need muscle-strengthening exercises on 2 or more days a week.  Silver Sneakers may be a benefit available to you. To determine eligibility, you may visit the website: www.silversneakers.com or contact program at [phone] Mon-Fri between 8AM-8PM.   . Maintain a healthy weight. The body mass index (BMI) is a screening tool to identify possible weight problems. It provides an estimate of body fat based on height and weight. Your health care provider can find your BMI and can help you achieve or maintain a healthy weight.   For adults 20 years and older: ? A BMI below 18.5 is considered underweight. ? A BMI of 18.5 to 24.9 is normal. ? A BMI of 25 to 29.9 is considered overweight. ? A BMI of 30 and above is considered obese.   . Maintain normal blood lipids and cholesterol levels by exercising and minimizing your intake of saturated fat. Eat a balanced diet with plenty of fruit and vegetables. Blood tests for lipids and cholesterol should begin  at age 59 and be repeated every 5 years. If your lipid or cholesterol levels are high, you are over 50, or you are at high risk for heart disease, you may need your cholesterol levels checked more frequently. Ongoing high lipid and cholesterol levels should be treated with medicines if diet and exercise are not working.  . If you smoke, find out from your health care provider how to quit. If you do not use tobacco, please do not start.  . If you choose to drink alcohol, please do not consume more than 2 drinks per day. One drink is considered to be 12 ounces (355 mL)  of beer, 5 ounces (148 mL) of wine, or 1.5 ounces (44 mL) of liquor.  . If you are 65-7 years old, ask your health care provider if you should take aspirin to prevent strokes.  . Use sunscreen. Apply sunscreen liberally and repeatedly throughout the day. You should seek shade when your shadow is shorter than you. Protect yourself by wearing long sleeves, pants, a wide-brimmed hat, and sunglasses year round, whenever you are outdoors.  . Once a month, do a whole body skin exam, using a mirror to look at the skin on your back. Tell your health care provider of new moles, moles that have irregular borders, moles that are larger than a pencil eraser, or moles that have changed in shape or color.

## 2016-04-22 NOTE — Assessment & Plan Note (Signed)
bp in fair control at this time  BP Readings from Last 1 Encounters:  04/22/16 128/80   No changes needed Disc lifstyle change with low sodium diet and exercise   Labs rev

## 2016-04-22 NOTE — Progress Notes (Signed)
PCP notes:  PCV13 - administered A1C - per pt, lab drawn by endocrinology Foot exam - will complete at CPE or at endocrinology Eye exam - pending future appt in 04/2016  Abnormal screenings:  Hearing - failed  Patient concerns: Rash to upper chest area; pt reports recent change in laundry detergent  Nurse concerns: None  Next PCP appt: 04/22/16 @ 1430  I reviewed health advisor's note, was available for consultation, and agree with documentation and plan. Loura Pardon MD

## 2016-04-22 NOTE — Assessment & Plan Note (Signed)
Doing well/continues oncol and surgical follow ups On Tamoxifen Will order dexa

## 2016-04-22 NOTE — Assessment & Plan Note (Signed)
Over 2 y since last dexa Is on Tamoxifen No falls or fx Disc need for calcium/ vitamin D/ wt bearing exercise and bone density test every 2 y to monitor Disc safety/ fracture risk in detail    Ref for dexa

## 2016-04-22 NOTE — Assessment & Plan Note (Signed)
Worse off PPI Disc poss long term side eff of PPI Will see if an H2 blocker would help first -will start with 150 mg ranitidine bid and then if well controlled move to prn Disc GERD diet and better habits

## 2016-04-22 NOTE — Assessment & Plan Note (Signed)
Lab today Disc goals for lipids and reasons to control them Rev labs with pt-drom last check Rev low sat fat diet in detail

## 2016-04-22 NOTE — Assessment & Plan Note (Signed)
Reviewed health habits including diet and exercise and skin cancer prevention Reviewed appropriate screening tests for age  Also reviewed health mt list, fam hx and immunization status , as well as social and family history   Labs ordered  Rev AMW- pt does not c/o of hearing problem  Continue f/u Dr Chalmers Cater for DM2 Try to get 1200-1500 mg of calcium per day with at least 1000 iu of vitamin D - for bone health  Try zantac as needed for heartburn (if this does not help please let me know) Get your eye exam next month as planned  We will sign you up for the cologuard program for colon cancer screening  If you are interested in a shingles/zoster vaccine - call your insurance to check on coverage,( you should not get it within 1 month of other vaccines) , then call us for a prescription  for it to take to a pharmacy that gives the shot , or make a nurse visit to get it here depending on your coverage Labs today

## 2016-04-22 NOTE — Progress Notes (Signed)
Subjective:    Patient ID: Laura Bell, female    DOB: [birth year], 80 y.o.   MRN: [id number]  HPI Here for health maintenance exam and to review chronic medical problems  Has been tired lately - ? From the heat    Had her AMW with Katha Cabal today and has not had labs yet   Wt Readings from Last 3 Encounters:  04/22/16 141 lb 12 oz (64.3 kg)  04/22/16 141 lb 12 oz (64.3 kg)  11/14/15 145 lb 8 oz (66 kg)  bmi is 26.3  dexa 12/13 -osteopenia of spine and FN OP of forearm On tamoxifen Falls-none  Fracture hx -none  Ca and D-not taking ca/ just vit D   mammogram 3/17- neg (hx of lumpectomy b9 in the past) and also breast cancer 1/15 DCIS- she continues to have f/u Dr Jana Hakim and Dr Marlou Starks Self breast exam   Hx of cervical cancer in 1964 No abn paps since - all normal and no symptoms  She has a cystocele - urologist did not recommend tacking bladder   Colonoscopy 11/07- diverticulosis and hemorrhoids  Pt is open to doing cologuard   More problems since stopping omeprazole - heartburn at night Would like to try zantac She was symptom free for 2 mo and then symptoms came back tums make her stomach hurt   opth exam 7/16 with no retinopathy She has an appt next mo ([facility])  Zoster vaccine - she is interested in one if it is covered  Tetanus shot 7/12  PNA completed both vaccines (had one [facility])   bp is stable today  No cp or palpitations or headaches or edema  No side effects to medicines  BP Readings from Last 3 Encounters:  04/22/16 136/80  04/22/16 136/80  11/14/15 130/70     Sees Dr Chalmers Cater for DM2 Pt states her blood glucose is up and down  On metformin and bid glipizide was added recently    Hx of hyperlipidemia Lab Results  Component Value Date   CHOL 137 06/01/2015   HDL 48.40 06/01/2015   LDLCALC 61 06/01/2015   TRIG 139.0 06/01/2015   CHOLHDL 3 06/01/2015    Review of Systems Review of Systems  Constitutional: Negative for fever,  appetite change, fatigue and unexpected weight change.  Eyes: Negative for pain and visual disturbance.  Respiratory: Negative for cough and shortness of breath.   Cardiovascular: Negative for cp or palpitations    Gastrointestinal: Negative for nausea, diarrhea and constipation.  Genitourinary: Negative for urgency and frequency.  Skin: Negative for pallor and pos for itchy rash (just red) over L collar bone-? Bug bites  Neurological: Negative for weakness, light-headedness, numbness and headaches.  Hematological: Negative for adenopathy. Does not bruise/bleed easily.  Psychiatric/Behavioral: Negative for dysphoric mood. The patient is not nervous/anxious.         Objective:   Physical Exam  Constitutional: She appears well-developed and well-nourished. No distress.  Well appearing elderly female  HENT:  Head: Normocephalic and atraumatic.  Right Ear: External ear normal.  Left Ear: External ear normal.  Mouth/Throat: Oropharynx is clear and moist.  Eyes: Conjunctivae and EOM are normal. Pupils are equal, round, and reactive to light. No scleral icterus.  Neck: Normal range of motion. Neck supple. No JVD present. Carotid bruit is not present. No thyromegaly present.  Cardiovascular: Normal rate, regular rhythm, normal heart sounds and intact distal pulses.  Exam reveals no gallop.   Varicose veins on LEs  stable w/o ulceration   Pulmonary/Chest: Effort normal and breath sounds normal. No respiratory distress. She has no wheezes. She exhibits no tenderness.  Abdominal: Soft. Bowel sounds are normal. She exhibits no distension, no abdominal bruit and no mass. There is no tenderness.  Genitourinary: No breast swelling, tenderness, discharge or bleeding.  Genitourinary Comments: Breast exam: No mass, nodules, thickening, tenderness, bulging, retraction, inflamation, nipple discharge or skin changes noted.  No axillary or clavicular LA.    Baseline scarring on lat L breast     Musculoskeletal: Normal range of motion. She exhibits no edema or tenderness.  Mild kyphosis   Lymphadenopathy:    She has no cervical adenopathy.  Neurological: She is alert. She has normal reflexes. No cranial nerve deficit. She exhibits normal muscle tone. Coordination normal.  Skin: Skin is warm and dry. No rash noted. No erythema. No pallor.  Fair complexion  Psychiatric: She has a normal mood and affect.          Assessment & Plan:   Problem List Items Addressed This Visit      Cardiovascular and Mediastinum   Essential hypertension    bp in fair control at this time  BP Readings from Last 1 Encounters:  04/22/16 128/80   No changes needed Disc lifstyle change with low sodium diet and exercise   Labs rev      Relevant Medications   lisinopril (PRINIVIL,ZESTRIL) 40 MG tablet     Digestive   GERD    Worse off PPI Disc poss long term side eff of PPI Will see if an H2 blocker would help first -will start with 150 mg ranitidine bid and then if well controlled move to prn Disc GERD diet and better habits       Relevant Medications   ranitidine (ZANTAC) 150 MG tablet     Musculoskeletal and Integument   Osteopenia    Over 2 y since last dexa Is on Tamoxifen No falls or fx Disc need for calcium/ vitamin D/ wt bearing exercise and bone density test every 2 y to monitor Disc safety/ fracture risk in detail    Ref for dexa         Other   Routine general medical examination at a health care facility - Primary    Reviewed health habits including diet and exercise and skin cancer prevention Reviewed appropriate screening tests for age  Also reviewed health mt list, fam hx and immunization status , as well as social and family history   Labs ordered  Rev AMW- pt does not c/o of hearing problem  Continue f/u Dr Chalmers Cater for DM2 Try to get 1200-1500 mg of calcium per day with at least 1000 iu of vitamin D - for bone health  Try zantac as needed for heartburn (if  this does not help please let me know) Get your eye exam next month as planned  We will sign you up for the cologuard program for colon cancer screening  If you are interested in a shingles/zoster vaccine - call your insurance to check on coverage,( you should not get it within 1 month of other vaccines) , then call us for a prescription  for it to take to a pharmacy that gives the shot , or make a nurse visit to get it here depending on your coverage Labs today        Relevant Orders   CBC with Differential/Platelet (Completed)   Comprehensive metabolic panel (Completed)   TSH (Completed)  Lipid panel (Completed)   HYPERLIPIDEMIA, MIXED    Lab today Disc goals for lipids and reasons to control them Rev labs with pt-drom last check Rev low sat fat diet in detail       Relevant Medications   lisinopril (PRINIVIL,ZESTRIL) 40 MG tablet   Estrogen deficiency   Relevant Orders   DG Bone Density   Breast cancer of lower-outer quadrant of left female breast ([place])    Doing well/continues oncol and surgical follow ups On Tamoxifen Will order dexa        Other Visit Diagnoses   None.

## 2016-04-22 NOTE — Progress Notes (Signed)
Pre visit review using our clinic review tool, if applicable. No additional management support is needed unless otherwise documented below in the visit note. 

## 2016-04-25 ENCOUNTER — Encounter: Payer: Self-pay | Admitting: *Deleted

## 2016-05-13 ENCOUNTER — Ambulatory Visit
Admission: RE | Admit: 2016-05-13 | Discharge: 2016-05-13 | Disposition: A | Discharge status: Home / Self Care | Payer: Medicare HMO | Source: Ambulatory Visit | Attending: Family Medicine | Admitting: Family Medicine

## 2016-05-13 DIAGNOSIS — M8588 Other specified disorders of bone density and structure, other site: Secondary | ICD-10-CM | POA: Insufficient documentation

## 2016-05-13 DIAGNOSIS — M85852 Other specified disorders of bone density and structure, left thigh: Secondary | ICD-10-CM | POA: Diagnosis not present

## 2016-05-13 DIAGNOSIS — Z853 Personal history of malignant neoplasm of breast: Secondary | ICD-10-CM | POA: Diagnosis not present

## 2016-05-13 DIAGNOSIS — Z780 Asymptomatic menopausal state: Secondary | ICD-10-CM | POA: Insufficient documentation

## 2016-05-13 DIAGNOSIS — Z87891 Personal history of nicotine dependence: Secondary | ICD-10-CM | POA: Insufficient documentation

## 2016-05-13 DIAGNOSIS — E2839 Other primary ovarian failure: Secondary | ICD-10-CM

## 2016-05-14 DIAGNOSIS — E119 Type 2 diabetes mellitus without complications: Secondary | ICD-10-CM | POA: Diagnosis not present

## 2016-05-14 LAB — HM DIABETES EYE EXAM

## 2016-05-16 ENCOUNTER — Encounter: Payer: Self-pay | Admitting: Family Medicine

## 2016-05-28 ENCOUNTER — Other Ambulatory Visit: Payer: Self-pay | Admitting: Family Medicine

## 2016-05-28 LAB — COLOGUARD

## 2016-06-04 ENCOUNTER — Encounter: Payer: Self-pay | Admitting: Family Medicine

## 2016-06-04 DIAGNOSIS — Z1212 Encounter for screening for malignant neoplasm of rectum: Secondary | ICD-10-CM | POA: Diagnosis not present

## 2016-06-04 DIAGNOSIS — Z1211 Encounter for screening for malignant neoplasm of colon: Secondary | ICD-10-CM | POA: Diagnosis not present

## 2016-06-04 LAB — COLOGUARD: COLOGUARD: NEGATIVE

## 2016-06-05 ENCOUNTER — Other Ambulatory Visit: Payer: Self-pay | Admitting: Family Medicine

## 2016-06-06 DIAGNOSIS — R69 Illness, unspecified: Secondary | ICD-10-CM | POA: Diagnosis not present

## 2016-06-10 DIAGNOSIS — R69 Illness, unspecified: Secondary | ICD-10-CM | POA: Diagnosis not present

## 2016-06-11 DIAGNOSIS — E1165 Type 2 diabetes mellitus with hyperglycemia: Secondary | ICD-10-CM | POA: Diagnosis not present

## 2016-06-12 ENCOUNTER — Ambulatory Visit (INDEPENDENT_AMBULATORY_CARE_PROVIDER_SITE_OTHER): Payer: Medicare HMO | Admitting: Family Medicine

## 2016-06-12 ENCOUNTER — Encounter: Payer: Self-pay | Admitting: Family Medicine

## 2016-06-12 VITALS — BP 134/82 | HR 64 | Temp 98.0°F | Wt 145.0 lb

## 2016-06-12 DIAGNOSIS — L309 Dermatitis, unspecified: Secondary | ICD-10-CM

## 2016-06-12 DIAGNOSIS — R21 Rash and other nonspecific skin eruption: Secondary | ICD-10-CM | POA: Diagnosis not present

## 2016-06-12 MED ORDER — PERMETHRIN 5 % EX CREA: 1.0000 "application " | TOPICAL_CREAM | Freq: Once | CUTANEOUS | 0 refills | Status: AC

## 2016-06-12 MED ORDER — MOMETASONE FUROATE 0.1 % EX CREA: 1.0000 "application " | TOPICAL_CREAM | Freq: Every day | CUTANEOUS | 1 refills | Status: DC | PRN
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
## 2016-06-12 NOTE — Progress Notes (Signed)
Pre visit review using our clinic review tool, if applicable. No additional management support is needed unless otherwise documented below in the visit note. 

## 2016-06-12 NOTE — Patient Instructions (Addendum)
I think scabies is possible  Use the permethrin cream as directed one time  Then you can use the mometasone cream to affected areas as needed  Benadryl orally is ok for itch Update me if not improved in 2 weeks or so   Keep using fragrance free products and avoid hot water

## 2016-06-13 NOTE — Assessment & Plan Note (Signed)
Given involvement of axillae and finger web spaces -cannot r/o scabies  Will tx with permethrin  Also hx of contact dermatitis -renewed mometasone cream for prn use  Disc eliminating fragrances in products  Avoid hot water  Update if not starting to improve in a week or if worsening

## 2016-06-14 DIAGNOSIS — E1165 Type 2 diabetes mellitus with hyperglycemia: Secondary | ICD-10-CM | POA: Diagnosis not present

## 2016-06-18 DIAGNOSIS — E1165 Type 2 diabetes mellitus with hyperglycemia: Secondary | ICD-10-CM | POA: Diagnosis not present

## 2016-07-02 DIAGNOSIS — Z23 Encounter for immunization: Secondary | ICD-10-CM | POA: Diagnosis not present

## 2016-07-09 ENCOUNTER — Other Ambulatory Visit: Payer: Self-pay | Admitting: Family Medicine

## 2016-07-26 DIAGNOSIS — R69 Illness, unspecified: Secondary | ICD-10-CM | POA: Diagnosis not present

## 2016-07-29 DIAGNOSIS — R69 Illness, unspecified: Secondary | ICD-10-CM | POA: Diagnosis not present

## 2016-08-21 DIAGNOSIS — D0512 Intraductal carcinoma in situ of left breast: Secondary | ICD-10-CM | POA: Diagnosis not present

## 2016-09-10 DIAGNOSIS — E1165 Type 2 diabetes mellitus with hyperglycemia: Secondary | ICD-10-CM | POA: Diagnosis not present

## 2016-09-11 ENCOUNTER — Other Ambulatory Visit: Payer: Self-pay | Admitting: Family Medicine

## 2016-09-15 ENCOUNTER — Other Ambulatory Visit: Payer: Self-pay | Admitting: Family Medicine

## 2016-09-16 DIAGNOSIS — R69 Illness, unspecified: Secondary | ICD-10-CM | POA: Diagnosis not present

## 2016-09-19 DIAGNOSIS — R69 Illness, unspecified: Secondary | ICD-10-CM | POA: Diagnosis not present

## 2016-09-19 DIAGNOSIS — E119 Type 2 diabetes mellitus without complications: Secondary | ICD-10-CM | POA: Diagnosis not present

## 2016-10-09 ENCOUNTER — Other Ambulatory Visit: Payer: Self-pay | Admitting: Family Medicine

## 2016-11-07 DIAGNOSIS — R69 Illness, unspecified: Secondary | ICD-10-CM | POA: Diagnosis not present

## 2016-11-12 ENCOUNTER — Ambulatory Visit (HOSPITAL_BASED_OUTPATIENT_CLINIC_OR_DEPARTMENT_OTHER): Payer: Medicare HMO | Admitting: Oncology

## 2016-11-12 VITALS — BP 139/65 | HR 79 | Temp 98.4°F | Resp 18 | Ht 61.5 in | Wt 149.6 lb

## 2016-11-12 DIAGNOSIS — Z170 Estrogen receptor positive status [ER+]: Secondary | ICD-10-CM

## 2016-11-12 DIAGNOSIS — D0512 Intraductal carcinoma in situ of left breast: Secondary | ICD-10-CM | POA: Diagnosis not present

## 2016-11-12 DIAGNOSIS — C50512 Malignant neoplasm of lower-outer quadrant of left female breast: Secondary | ICD-10-CM

## 2016-11-12 DIAGNOSIS — Z79810 Long term (current) use of selective estrogen receptor modulators (SERMs): Secondary | ICD-10-CM | POA: Diagnosis not present

## 2016-11-12 MED ORDER — TAMOXIFEN CITRATE 20 MG PO TABS: 20.0000 mg | ORAL_TABLET | Freq: Every day | ORAL | 4 refills | Status: DC
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
## 2016-11-13 ENCOUNTER — Other Ambulatory Visit: Payer: Self-pay | Admitting: General Surgery

## 2016-11-13 DIAGNOSIS — Z853 Personal history of malignant neoplasm of breast: Secondary | ICD-10-CM

## 2016-11-14 ENCOUNTER — Telehealth: Payer: Self-pay

## 2016-11-14 ENCOUNTER — Other Ambulatory Visit: Payer: Self-pay

## 2016-11-14 MED ORDER — GLUCOSE BLOOD VI STRP: ORAL_STRIP | 5 refills | Status: DC

## 2016-11-14 NOTE — Telephone Encounter (Signed)
[employer] left v/m; accu chek aviva plus test strips are not covered; if pt to continue use of accu chek test strips will need to call [phone] for prior auth. The covered alternatives are onetouch ultra and onetouch verio test strips. Please advise.[facility] [address]

## 2016-11-14 NOTE — Telephone Encounter (Signed)
Pt left v/m requesting refill accu chek test strips. Appears refilled 08/2016. I spoke with Colletta Maryland at [facility] [address] and she said needs new rx with Diagnosis code. Done and pt will ck with pharmacy to pick up today.

## 2016-11-15 DIAGNOSIS — R69 Illness, unspecified: Secondary | ICD-10-CM | POA: Diagnosis not present

## 2016-11-15 MED ORDER — ONETOUCH VERIO W/DEVICE KIT: 1.0000 | PACK | Freq: Two times a day (BID) | 0 refills | Status: AC

## 2016-11-15 MED ORDER — GLUCOSE BLOOD VI STRP: ORAL_STRIP | 2 refills | Status: DC

## 2016-11-15 NOTE — Telephone Encounter (Signed)
New Rx sent for new meter and strips

## 2016-11-16 ENCOUNTER — Telehealth: Payer: Self-pay | Admitting: Oncology

## 2016-11-16 NOTE — Telephone Encounter (Signed)
Lm advising appt 11/18/17 @ 3pm. Also, mailed calendar.

## 2016-12-13 DIAGNOSIS — E119 Type 2 diabetes mellitus without complications: Secondary | ICD-10-CM | POA: Diagnosis not present

## 2016-12-16 ENCOUNTER — Ambulatory Visit
Admission: RE | Admit: 2016-12-16 | Discharge: 2016-12-16 | Disposition: A | Discharge status: Home / Self Care | Payer: Medicare HMO | Source: Ambulatory Visit | Attending: General Surgery | Admitting: General Surgery

## 2016-12-16 DIAGNOSIS — R922 Inconclusive mammogram: Secondary | ICD-10-CM | POA: Diagnosis not present

## 2016-12-16 DIAGNOSIS — Z853 Personal history of malignant neoplasm of breast: Secondary | ICD-10-CM

## 2016-12-20 DIAGNOSIS — E119 Type 2 diabetes mellitus without complications: Secondary | ICD-10-CM | POA: Diagnosis not present

## 2017-01-01 DIAGNOSIS — R69 Illness, unspecified: Secondary | ICD-10-CM | POA: Diagnosis not present

## 2017-01-19 DIAGNOSIS — R69 Illness, unspecified: Secondary | ICD-10-CM | POA: Diagnosis not present

## 2017-01-29 ENCOUNTER — Encounter: Payer: Self-pay | Admitting: Family Medicine

## 2017-01-29 ENCOUNTER — Ambulatory Visit (INDEPENDENT_AMBULATORY_CARE_PROVIDER_SITE_OTHER): Payer: Medicare HMO | Admitting: Family Medicine

## 2017-01-29 VITALS — BP 136/62 | HR 108 | Temp 98.0°F | Ht 61.5 in | Wt 150.5 lb

## 2017-01-29 DIAGNOSIS — I10 Essential (primary) hypertension: Secondary | ICD-10-CM

## 2017-01-29 DIAGNOSIS — T50905A Adverse effect of unspecified drugs, medicaments and biological substances, initial encounter: Secondary | ICD-10-CM | POA: Insufficient documentation

## 2017-01-29 DIAGNOSIS — R002 Palpitations: Secondary | ICD-10-CM | POA: Diagnosis not present

## 2017-01-29 DIAGNOSIS — T887XXA Unspecified adverse effect of drug or medicament, initial encounter: Secondary | ICD-10-CM

## 2017-01-29 DIAGNOSIS — R5383 Other fatigue: Secondary | ICD-10-CM | POA: Insufficient documentation
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
## 2017-01-29 NOTE — Patient Instructions (Signed)
I think your fatigue and ankle swelling were coming from the pioglitazone  Stay off of it and within the next week I think you will know for sure Call Dr Joycie Peek office to leave a message- let them know you were having side effects of fatigue/ swelling and exercise intolerance/palpitations  and you stopped it   Take care of yourself  Let me know if you want to see a counselor for stress  Eat a healthy diet   If palpitations return or symptoms do not continue to improve please let me know

## 2017-01-29 NOTE — Assessment & Plan Note (Signed)
Suspect side eff of generic actos- fatigue/exercise intol/ pedal edema and palpitations Day 3 off of it- much improved Will stay off  And call endo office  Alert [place] if not further improved in the next wk

## 2017-01-29 NOTE — Progress Notes (Signed)
Pre visit review using our clinic review tool, if applicable. No additional management support is needed unless otherwise documented below in the visit note. 

## 2017-01-29 NOTE — Assessment & Plan Note (Signed)
Acute since taking generic actos along with symptoms of exercise intolerance and pedal edema and palpitations Day 3 of holding it and improving Suspect this was cause  Re assuring exam  Disc mood and stressors as well Adv to hold med and call her endo office

## 2017-01-29 NOTE — Assessment & Plan Note (Signed)
Per pt now resolved off generic actos  She c/o of skipped beat sensation EKG- sinus tach today (feeling anxious)-no acute changes  Continue to follow and update if symptoms return inst to avoid caffeine

## 2017-01-29 NOTE — Assessment & Plan Note (Signed)
Well controlled in setting of malaise-likely from generic actos No pedal edama off of the medication  On lisiopril for HTN BP: 136/62

## 2017-01-29 NOTE — Assessment & Plan Note (Signed)
Sees Dr Gabriel Carina On generic actos pt had better glucose control but sited exhaustion/poor exercise tol/ pedal edema Off of it 3 d and feeling better inst to stay off of that and call the endo office for further inst

## 2017-02-03 DIAGNOSIS — E119 Type 2 diabetes mellitus without complications: Secondary | ICD-10-CM | POA: Diagnosis not present

## 2017-03-05 DIAGNOSIS — R69 Illness, unspecified: Secondary | ICD-10-CM | POA: Diagnosis not present

## 2017-03-16 ENCOUNTER — Other Ambulatory Visit: Payer: Self-pay | Admitting: Family Medicine

## 2017-03-17 DIAGNOSIS — R69 Illness, unspecified: Secondary | ICD-10-CM | POA: Diagnosis not present

## 2017-03-19 DIAGNOSIS — R350 Frequency of micturition: Secondary | ICD-10-CM | POA: Diagnosis not present

## 2017-03-19 DIAGNOSIS — N3946 Mixed incontinence: Secondary | ICD-10-CM | POA: Diagnosis not present

## 2017-03-28 DIAGNOSIS — E119 Type 2 diabetes mellitus without complications: Secondary | ICD-10-CM | POA: Diagnosis not present

## 2017-04-04 DIAGNOSIS — E119 Type 2 diabetes mellitus without complications: Secondary | ICD-10-CM | POA: Diagnosis not present

## 2017-04-16 ENCOUNTER — Telehealth: Payer: Self-pay

## 2017-04-16 NOTE — Telephone Encounter (Signed)
Patient is on the list for [employer] 2018 and may be a good candidate for an AWV. Please let me know if/when appt is scheduled.   

## 2017-04-21 NOTE — Telephone Encounter (Signed)
Scheduled 05/01/17

## 2017-04-25 NOTE — Progress Notes (Signed)
PCP notes:   Health maintenance: Foot exam - due. States Dr Joycie Peek at [facility] does these per pt. A1C - Done at [facility], results in [place].   Abnormal screenings:  Mini cog.   Patient concerns: None.   Nurse concerns: None.   Next PCP appt: 05/14/2017.
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
## 2017-04-27 ENCOUNTER — Other Ambulatory Visit: Payer: Self-pay | Admitting: Family Medicine

## 2017-04-27 ENCOUNTER — Telehealth: Payer: Self-pay | Admitting: Family Medicine

## 2017-04-27 DIAGNOSIS — I10 Essential (primary) hypertension: Secondary | ICD-10-CM

## 2017-04-27 DIAGNOSIS — E782 Mixed hyperlipidemia: Secondary | ICD-10-CM

## 2017-04-27 NOTE — Telephone Encounter (Signed)
-----   Message from Ellamae Sia sent at 04/25/2017 11:54 AM EDT ----- Regarding: Lab orders for Wednesday, 8.1.18  AWV lab orders, please.

## 2017-04-29 ENCOUNTER — Other Ambulatory Visit: Payer: Self-pay | Admitting: *Deleted

## 2017-04-29 DIAGNOSIS — R69 Illness, unspecified: Secondary | ICD-10-CM | POA: Diagnosis not present

## 2017-04-29 MED ORDER — GLUCOSE BLOOD VI STRP: ORAL_STRIP | 0 refills | Status: DC

## 2017-05-01 ENCOUNTER — Ambulatory Visit: Payer: Medicare HMO

## 2017-05-01 ENCOUNTER — Other Ambulatory Visit (INDEPENDENT_AMBULATORY_CARE_PROVIDER_SITE_OTHER): Payer: Medicare HMO

## 2017-05-01 VITALS — BP 112/72 | HR 104 | Resp 16 | Ht 61.5 in | Wt 146.6 lb

## 2017-05-01 DIAGNOSIS — Z0000 Encounter for general adult medical examination without abnormal findings: Secondary | ICD-10-CM

## 2017-05-01 DIAGNOSIS — E782 Mixed hyperlipidemia: Secondary | ICD-10-CM | POA: Diagnosis not present

## 2017-05-01 DIAGNOSIS — I10 Essential (primary) hypertension: Secondary | ICD-10-CM | POA: Diagnosis not present

## 2017-05-01 LAB — LIPID PANEL
CHOL/HDL RATIO: 3
Cholesterol: 148 mg/dL (ref 0–200)
HDL: 57.8 mg/dL (ref >39.00)
LDL Cholesterol: 68 mg/dL (ref 0–99)
NONHDL: 89.85
TRIGLYCERIDES: 109 mg/dL (ref 0.0–149.0)
VLDL: 21.8 mg/dL (ref 0.0–40.0)

## 2017-05-01 LAB — CBC WITH DIFFERENTIAL/PLATELET
BASOS ABS: 0.1 10*3/uL (ref 0.0–0.1)
BASOS PCT: 1.1 % (ref 0.0–3.0)
EOS PCT: 2.8 % (ref 0.0–5.0)
Eosinophils Absolute: 0.2 10*3/uL (ref 0.0–0.7)
HEMATOCRIT: 40.5 % (ref 36.0–46.0)
Hemoglobin: 13.4 g/dL (ref 12.0–15.0)
LYMPHS ABS: 1.6 10*3/uL (ref 0.7–4.0)
Lymphocytes Relative: 20.7 % (ref 12.0–46.0)
MCHC: 33.2 g/dL (ref 30.0–36.0)
MCV: 90.5 fl (ref 78.0–100.0)
MONOS PCT: 7.4 % (ref 3.0–12.0)
Monocytes Absolute: 0.6 10*3/uL (ref 0.1–1.0)
NEUTROS ABS: 5.4 10*3/uL (ref 1.4–7.7)
Neutrophils Relative %: 68 % (ref 43.0–77.0)
PLATELETS: 243 10*3/uL (ref 150.0–400.0)
RBC: 4.47 Mil/uL (ref 3.87–5.11)
RDW: 12.6 % (ref 11.5–15.5)
WBC: 7.9 10*3/uL (ref 4.0–10.5)

## 2017-05-01 LAB — COMPREHENSIVE METABOLIC PANEL
ALT: 12 U/L (ref 0–35)
AST: 13 U/L (ref 0–37)
Albumin: 4 g/dL (ref 3.5–5.2)
Alkaline Phosphatase: 43 U/L (ref 39–117)
BUN: 9 mg/dL (ref 6–23)
CALCIUM: 9.9 mg/dL (ref 8.4–10.5)
CHLORIDE: 100 meq/L (ref 96–112)
CO2: 27 meq/L (ref 19–32)
Creatinine, Ser: 0.69 mg/dL (ref 0.40–1.20)
GFR: 87.22 mL/min (ref >60.00)
GLUCOSE: 176 mg/dL — AB (ref 70–99)
POTASSIUM: 4.5 meq/L (ref 3.5–5.1)
Sodium: 134 mEq/L — ABNORMAL LOW (ref 135–145)
Total Bilirubin: 0.3 mg/dL (ref 0.2–1.2)
Total Protein: 6.6 g/dL (ref 6.0–8.3)

## 2017-05-01 LAB — TSH: TSH: 3.92 u[IU]/mL (ref 0.35–4.50)

## 2017-05-01 NOTE — Progress Notes (Signed)
I reviewed health advisor's note, was available for consultation, and agree with documentation and plan.  

## 2017-05-01 NOTE — Patient Instructions (Addendum)
Laura Bell , Thank you for taking time to come for your Medicare Wellness Visit. I appreciate your ongoing commitment to your health goals. Please review the following plan we discussed and let me know if I can assist you in the future.   These are the goals we discussed: Goals    . Increase physical activity          Starting 04/22/2016, I will continue to walk at least 15 min daily.  05/01/2017, I will continue to walk at least 15 min daily.        This is a list of the screening recommended for you and due dates:  Health Maintenance  Topic Date Due  . Hemoglobin A1C  09/30/2016  . Complete foot exam   04/15/2017  . Flu Shot  04/30/2017  . Eye exam for diabetics  05/14/2017  . Mammogram  12/16/2017  . Tetanus Vaccine  04/11/2021  . DEXA scan (bone density measurement)  Completed  . Pneumonia vaccines  Completed   Preventive Care for Adults  A healthy lifestyle and preventive care can promote health and wellness. Preventive health guidelines for adults include the following key practices.  . A routine yearly physical is a good way to check with your health care provider about your health and preventive screening. It is a chance to share any concerns and updates on your health and to receive a thorough exam.  . Visit your dentist for a routine exam and preventive care every 6 months. Brush your teeth twice a day and floss once a day. Good oral hygiene prevents tooth decay and gum disease.  . The frequency of eye exams is based on your age, health, family medical history, use  of contact lenses, and other factors. Follow your health care provider's ecommendations for frequency of eye exams.  . Eat a healthy diet. Foods like vegetables, fruits, whole grains, low-fat dairy products, and lean protein foods contain the nutrients you need without too many calories. Decrease your intake of foods high in solid fats, added sugars, and salt. Eat the right amount of calories for you. Get  information about a proper diet from your health care provider, if necessary.  . Regular physical exercise is one of the most important things you can do for your health. Most adults should get at least 150 minutes of moderate-intensity exercise (any activity that increases your heart rate and causes you to sweat) each week. In addition, most adults need muscle-strengthening exercises on 2 or more days a week.  Silver Sneakers may be a benefit available to you. To determine eligibility, you may visit the website: www.silversneakers.com or contact program at [phone] Mon-Fri between 8AM-8PM.   . Maintain a healthy weight. The body mass index (BMI) is a screening tool to identify possible weight problems. It provides an estimate of body fat based on height and weight. Your health care provider can find your BMI and can help you achieve or maintain a healthy weight.   For adults 20 years and older: ? A BMI below 18.5 is considered underweight. ? A BMI of 18.5 to 24.9 is normal. ? A BMI of 25 to 29.9 is considered overweight. ? A BMI of 30 and above is considered obese.   . Maintain normal blood lipids and cholesterol levels by exercising and minimizing your intake of saturated fat. Eat a balanced diet with plenty of fruit and vegetables. Blood tests for lipids and cholesterol should begin at age 90 and be repeated every  5 years. If your lipid or cholesterol levels are high, you are over 50, or you are at high risk for heart disease, you may need your cholesterol levels checked more frequently. Ongoing high lipid and cholesterol levels should be treated with medicines if diet and exercise are not working.  . If you smoke, find out from your health care provider how to quit. If you do not use tobacco, please do not start.  . If you choose to drink alcohol, please do not consume more than 2 drinks per day. One drink is considered to be 12 ounces (355 mL) of beer, 5 ounces (148 mL) of wine, or 1.5  ounces (44 mL) of liquor.  . If you are 67-75 years old, ask your health care provider if you should take aspirin to prevent strokes.  . Use sunscreen. Apply sunscreen liberally and repeatedly throughout the day. You should seek shade when your shadow is shorter than you. Protect yourself by wearing long sleeves, pants, a wide-brimmed hat, and sunglasses year round, whenever you are outdoors.  . Once a month, do a whole body skin exam, using a mirror to look at the skin on your back. Tell your health care provider of new moles, moles that have irregular borders, moles that are larger than a pencil eraser, or moles that have changed in shape or color.

## 2017-05-02 DIAGNOSIS — N3946 Mixed incontinence: Secondary | ICD-10-CM | POA: Diagnosis not present

## 2017-05-02 DIAGNOSIS — R351 Nocturia: Secondary | ICD-10-CM | POA: Diagnosis not present

## 2017-05-05 DIAGNOSIS — R69 Illness, unspecified: Secondary | ICD-10-CM | POA: Diagnosis not present

## 2017-05-14 ENCOUNTER — Ambulatory Visit (INDEPENDENT_AMBULATORY_CARE_PROVIDER_SITE_OTHER): Payer: Medicare HMO | Admitting: Family Medicine

## 2017-05-14 ENCOUNTER — Encounter: Payer: Self-pay | Admitting: Family Medicine

## 2017-05-14 VITALS — BP 132/74 | HR 104 | Temp 97.6°F | Ht 61.5 in | Wt 147.8 lb

## 2017-05-14 DIAGNOSIS — E1165 Type 2 diabetes mellitus with hyperglycemia: Secondary | ICD-10-CM | POA: Diagnosis not present

## 2017-05-14 DIAGNOSIS — E782 Mixed hyperlipidemia: Secondary | ICD-10-CM | POA: Diagnosis not present

## 2017-05-14 DIAGNOSIS — M8580 Other specified disorders of bone density and structure, unspecified site: Secondary | ICD-10-CM | POA: Diagnosis not present

## 2017-05-14 DIAGNOSIS — IMO0001 Reserved for inherently not codable concepts without codable children: Secondary | ICD-10-CM

## 2017-05-14 DIAGNOSIS — I10 Essential (primary) hypertension: Secondary | ICD-10-CM

## 2017-05-14 DIAGNOSIS — Z0000 Encounter for general adult medical examination without abnormal findings: Secondary | ICD-10-CM | POA: Diagnosis not present

## 2017-05-14 MED ORDER — SIMVASTATIN 20 MG PO TABS: ORAL_TABLET | ORAL | 3 refills | Status: DC

## 2017-05-14 MED ORDER — LISINOPRIL 40 MG PO TABS: 40.0000 mg | ORAL_TABLET | Freq: Every day | ORAL | 3 refills | Status: DC

## 2017-05-14 MED ORDER — RANITIDINE HCL 150 MG PO TABS: 150.0000 mg | ORAL_TABLET | Freq: Two times a day (BID) | ORAL | 3 refills | Status: DC

## 2017-05-14 NOTE — Patient Instructions (Signed)
Take care of yourself   Keep walking /exercising Stay social  Enjoy the great grandchild!   I refilled medicines   Labs are stable
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
## 2017-05-15 NOTE — Assessment & Plan Note (Signed)
Reviewed health habits including diet and exercise and skin cancer prevention Reviewed appropriate screening tests for age  Also reviewed health mt list, fam hx and immunization status , as well as social and family history   See HPI amw rev  Mentally sharp-rev mini cog (was nervous the day she did it) Labs reviewed  Enc her to continue physical activity and socialization

## 2017-05-15 NOTE — Assessment & Plan Note (Signed)
Sees Dr Gabriel Carina Has been well controlled -but glucose readings up this week  Watching diet  utd foot care Eye exam is planned

## 2017-05-15 NOTE — Assessment & Plan Note (Signed)
On tamoxifen No falls /fx On D  dexa rev 8/17 Enc exercise as tolerated Re check in a year

## 2017-05-15 NOTE — Assessment & Plan Note (Signed)
bp in fair control at this time  BP Readings from Last 1 Encounters:  05/14/17 132/74   No changes needed Disc lifstyle change with low sodium diet and exercise  Labs reviewed

## 2017-05-15 NOTE — Assessment & Plan Note (Signed)
Disc goals for lipids and reasons to control them Rev labs with pt Rev low sat fat diet in detail  Well controlled with simvastatin and diet  

## 2017-05-26 ENCOUNTER — Ambulatory Visit (INDEPENDENT_AMBULATORY_CARE_PROVIDER_SITE_OTHER): Payer: Medicare HMO | Admitting: Family Medicine

## 2017-05-26 ENCOUNTER — Encounter: Payer: Self-pay | Admitting: *Deleted

## 2017-05-26 ENCOUNTER — Encounter: Payer: Self-pay | Admitting: Family Medicine

## 2017-05-26 ENCOUNTER — Telehealth: Payer: Self-pay

## 2017-05-26 VITALS — BP 130/74 | HR 115 | Temp 97.4°F | Ht 61.5 in | Wt 145.8 lb

## 2017-05-26 DIAGNOSIS — R350 Frequency of micturition: Secondary | ICD-10-CM | POA: Diagnosis not present

## 2017-05-26 DIAGNOSIS — IMO0001 Reserved for inherently not codable concepts without codable children: Secondary | ICD-10-CM

## 2017-05-26 DIAGNOSIS — E1165 Type 2 diabetes mellitus with hyperglycemia: Secondary | ICD-10-CM

## 2017-05-26 LAB — POC URINALSYSI DIPSTICK (AUTOMATED)
Bilirubin, UA: NEGATIVE
Glucose, UA: 250
Ketones, UA: NEGATIVE
Leukocytes, UA: NEGATIVE
Nitrite, UA: NEGATIVE
PROTEIN UA: NEGATIVE
RBC UA: NEGATIVE
SPEC GRAV UA: 1.015 (ref 1.010–1.025)
UROBILINOGEN UA: 0.2 U/dL
pH, UA: 6 (ref 5.0–8.0)

## 2017-05-26 MED ORDER — SITAGLIPTIN PHOSPHATE 100 MG PO TABS: 100.0000 mg | ORAL_TABLET | Freq: Every day | ORAL | 11 refills | Status: DC

## 2017-05-26 NOTE — Assessment & Plan Note (Signed)
Very poor control as evidenced by home sugars and glucose in urine in office. Likely causing dehydration, fatigue and  Urinary frequency.  Reviewed Dr. Joycie Peek last ENDO note.Marland Kitchen Next step appeared to be adding DPP4 inhibitor.. Pt agreeable today to start Januvia.  Follow CBGs at home and keep follow up with ENDO.

## 2017-05-26 NOTE — Progress Notes (Signed)
Subjective:    Patient ID: Laura Bell, female    DOB: Aug 02, 1936, 81 y.o.   MRN: [id number]   HPI  81 year old female pt of Dr. Marliss Coots with diabetes urge incontinence presents with continued urge incontinence. Worse urinary frequency in last 3 days, bladder pressure,  Dribble of urine when has to go.  Able to empty today.  Right flank pain No fever, no abd pain, Feels vert tired  She sees urologist.. Recently increased myrbetriq to 50 mg daily.  She stopped myrbetriq three days ago to see if it was causing blood sugars being high.   UA.. clear except.. High glucose   She has also noted elevated blood sugars in last week  FBS this AM 223 and 300  2 huor post prandial > 200   She has also noted 2 weeks of dry cough, worse with lying down. No SOB  HX of DM   Per last note from ENDO.Marland Kitchen A1C 7.7-8.2 Piogliazone: ankle swelling Jardiance cause yeast infection  She is on metformin ( GFR 87), glucotrol XL max Lab Results  Component Value Date   HGBA1C 9.3 (H) 06/01/2015   Blood pressure 130/74, pulse (!) 115, temperature (!) 97.4 F (36.3 C), temperature source Oral, height 5' 1.5" (1.562 m), weight 145 lb 12 oz (66.1 kg).    Review of Systems  Constitutional: Positive for fatigue. Negative for fever.  HENT: Negative for ear pain.   Eyes: Negative for pain.  Respiratory: Positive for cough. Negative for chest tightness and shortness of breath.   Cardiovascular: Negative for chest pain, palpitations and leg swelling.  Gastrointestinal: Negative for abdominal pain.  Genitourinary: Positive for frequency and urgency. Negative for dysuria.       Objective:   Physical Exam  Constitutional: Vital signs are normal. She appears well-developed and well-nourished. She is cooperative.  Non-toxic appearance. She does not appear ill. No distress.  Elderly female in NAD   HENT:  Head: Normocephalic.  Right Ear: Hearing, tympanic membrane, external ear and ear canal normal.  Tympanic membrane is not erythematous, not retracted and not bulging.  Left Ear: Hearing, tympanic membrane, external ear and ear canal normal. Tympanic membrane is not erythematous, not retracted and not bulging.  Nose: No mucosal edema or rhinorrhea. Right sinus exhibits no maxillary sinus tenderness and no frontal sinus tenderness. Left sinus exhibits no maxillary sinus tenderness and no frontal sinus tenderness.  Mouth/Throat: Uvula is midline, oropharynx is clear and moist and mucous membranes are normal.  Eyes: Pupils are equal, round, and reactive to light. Conjunctivae, EOM and lids are normal. Lids are everted and swept, no foreign bodies found.  Neck: Trachea normal and normal range of motion. Neck supple. Carotid bruit is not present. No thyroid mass and no thyromegaly present.  Cardiovascular: Normal rate, regular rhythm, S1 normal, S2 normal, normal heart sounds, intact distal pulses and normal pulses.  Exam reveals no gallop and no friction rub.   No murmur heard. Pulmonary/Chest: Effort normal and breath sounds normal. No tachypnea. No respiratory distress. She has no decreased breath sounds. She has no wheezes. She has no rhonchi. She has no rales.  Abdominal: Soft. Normal appearance and bowel sounds are normal. There is no tenderness. There is CVA tenderness.  Right CVA tenderness  Musculoskeletal:       Thoracic back: Normal.       Lumbar back: Normal. She exhibits no bony tenderness.  Neurological: She is alert.  Skin: Skin is warm, dry and  intact. No rash noted.  Psychiatric: Her speech is normal and behavior is normal. Judgment and thought content normal. Her mood appears not anxious. Cognition and memory are normal. She does not exhibit a depressed mood.          Assessment & Plan:

## 2017-05-26 NOTE — Patient Instructions (Addendum)
Work on low [employer].Marland Kitchen decrease bread , pasta, potatoes, carrots, sweets.  Keep up with water intake. We will call with urine culture results.  Start Januvia 100 mg daily.  Follow blood blood sugars fasting and 2 hours after meals.  Call if sugars remaining > 250.  Restart myrbetriq.  Keep appointment with Dr. Gabriel Carina as planned.

## 2017-05-26 NOTE — Assessment & Plan Note (Signed)
Likely multifactorial due to glucosuria, and urge incontinence (pt had stopped myrbetriq). No sign of infection.. Send for culture to verify. Restart myrbetriq and get better sugar control.

## 2017-05-26 NOTE — Telephone Encounter (Signed)
Pt was seen earlier today and pt went to pick up Januvia; cost to pt was > $300.00. Pt request different med sent to [facility] [address]. Pt request cb.

## 2017-05-27 LAB — URINE CULTURE

## 2017-05-27 NOTE — Telephone Encounter (Signed)
   Please ask pharmacy if any other Dipeptidyl Peptidase 4 (DPP-4) Inhibitor are covered by her insurace.. Such as onglyza, etc.

## 2017-05-27 NOTE — Telephone Encounter (Signed)
Spoke with pharmacist at [facility].  She states Januvia cost is around $1300 a month so her insurance did cover about $1000 and patient's cost is $300.  She was not able to run Onglyza through the insurance without a prescription but she felt the Onglyza would cost the patient more. I have printed off formulary and will place in Dr. Rometta Emery in box for her to review.   Januvia is a Tier 3 so I am not sure there is a cheaper DPP-4 Inhibitor for patient to try.

## 2017-05-27 NOTE — Telephone Encounter (Signed)
Let pt know that I do not see any cheaper alternative on her formulary that she has not already tried and had SE or is currently on... Have her call her ENDO for further recommendations.. Have her let them know that her CBGs have been in 200s-300s.   I will forward this note to Dr. Gabriel Carina for recommendations.  See recent acute OV note.  CC: PCP as well

## 2017-05-28 ENCOUNTER — Telehealth: Payer: Self-pay

## 2017-05-28 MED ORDER — SULFAMETHOXAZOLE-TRIMETHOPRIM 800-160 MG PO TABS: 1.0000 | ORAL_TABLET | Freq: Two times a day (BID) | ORAL | 0 refills | Status: DC

## 2017-05-28 NOTE — Telephone Encounter (Signed)
Rx sent to correct pharmacy, pt notified of urine cx results and Dr. Marliss Coots comments and verbalized understanding

## 2017-05-28 NOTE — Telephone Encounter (Signed)
Arbie Cookey DPR signed left v/m; pt was seen on 05/26/17 and symptoms have worsened; pt having rt flank pain, lower abd cramping and a lot of pressure feeling when urinates.Pt has not gotten urine culture report yet. Carol request cb with what to do.[facility] [address]

## 2017-05-28 NOTE — Telephone Encounter (Signed)
It looks like her urine grew contaminates -no definite bacterial infection  Given symptoms- I am ok with a short course if antibiotic to see if it helps  I will send septra ds to her pharmacy  Please let us know if this gets rid of symptoms  Will cc to Dr Diona Browner who saw her

## 2017-06-21 ENCOUNTER — Other Ambulatory Visit: Payer: Self-pay | Admitting: Family Medicine

## 2017-06-23 DIAGNOSIS — R69 Illness, unspecified: Secondary | ICD-10-CM | POA: Diagnosis not present

## 2017-06-24 ENCOUNTER — Other Ambulatory Visit: Payer: Self-pay | Admitting: Family Medicine

## 2017-06-24 DIAGNOSIS — R69 Illness, unspecified: Secondary | ICD-10-CM | POA: Diagnosis not present

## 2017-07-04 DIAGNOSIS — R69 Illness, unspecified: Secondary | ICD-10-CM | POA: Diagnosis not present

## 2017-07-18 ENCOUNTER — Telehealth: Payer: Self-pay | Admitting: Family Medicine

## 2017-07-18 NOTE — Telephone Encounter (Signed)
[facility] Patient Name: Laura Bell DOB: 1936/05/12 Initial Comment Caller says she had a flu shot 2 weeks ago. Started sore throat and cough 2 days ago, and she is sniffling. Nurse Assessment Nurse: Jimmey Ralph, RN, Lissa Date/Time (Eastern Time): 07/18/2017 3:01:45 PM Confirm and document reason for call. If symptomatic, describe symptoms. ---Caller says she had a flu shot 2 weeks ago. Started sore throat and cough 2 days ago, and she is sniffling. I feel like I have a headache today, no fever. No muscle aches. No problems swallowing. No nausea, No vomiting. I didn't sleep all night because I couldn't breath good. My chest is tight when I cough. I have coughed all day but not coughing up any phlegm. I took some claritin today, but it didn't help. Does the patient have any new or worsening symptoms? ---Yes Will a triage be completed? ---Yes Related visit to physician within the last 2 weeks? ---No Does the PT have any chronic conditions? (i.e. diabetes, asthma, etc.) ---Yes List chronic conditions. ---diabetes HTN -on medications No new medications started Recently. Is this a behavioral health or substance abuse call? ---No Guidelines Guideline Title Affirmed Question Affirmed Notes Common Cold Earache Final Disposition User See Physician within 24 Hours Hammonds, RN, Lissa Comments My chest feels alittle tight but it doesn't hurt to take a deep breath. The runny nose is clear drainage. Able to eat and drink OK. I have urinated in last 12 hours. Referrals GO TO FACILITY UNDECIDED Caller Disagree/Comply Disagree Caller Understands Yes PreDisposition Did not know what to do  Call Id: [id number]

## 2017-07-18 NOTE — Telephone Encounter (Signed)
Aware, thanks!

## 2017-07-18 NOTE — Telephone Encounter (Signed)
I spoke with pt and offered appt at [facility]; pt said she is going to take care of herself and if not better next [facility] pt will call for appt. FYI to Dr. Glori Bickers.

## 2017-07-29 DIAGNOSIS — E119 Type 2 diabetes mellitus without complications: Secondary | ICD-10-CM | POA: Diagnosis not present

## 2017-08-05 DIAGNOSIS — E119 Type 2 diabetes mellitus without complications: Secondary | ICD-10-CM | POA: Diagnosis not present

## 2017-08-06 ENCOUNTER — Ambulatory Visit: Payer: Medicare HMO | Admitting: Family Medicine

## 2017-08-09 DIAGNOSIS — R69 Illness, unspecified: Secondary | ICD-10-CM | POA: Diagnosis not present

## 2017-08-12 ENCOUNTER — Ambulatory Visit: Payer: Medicare HMO | Admitting: Family Medicine

## 2017-08-12 ENCOUNTER — Encounter: Payer: Self-pay | Admitting: Family Medicine

## 2017-08-12 VITALS — BP 140/70 | HR 77 | Temp 97.9°F | Ht 61.5 in | Wt 146.0 lb

## 2017-08-12 DIAGNOSIS — E1165 Type 2 diabetes mellitus with hyperglycemia: Secondary | ICD-10-CM

## 2017-08-12 DIAGNOSIS — R21 Rash and other nonspecific skin eruption: Secondary | ICD-10-CM

## 2017-08-12 DIAGNOSIS — L304 Erythema intertrigo: Secondary | ICD-10-CM | POA: Insufficient documentation

## 2017-08-12 MED ORDER — NYSTATIN-TRIAMCINOLONE 100000-0.1 UNIT/GM-% EX OINT: 1.0000 "application " | TOPICAL_OINTMENT | Freq: Two times a day (BID) | CUTANEOUS | 0 refills | Status: DC

## 2017-08-12 NOTE — Patient Instructions (Signed)
Great to see you!   
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
## 2017-08-12 NOTE — Assessment & Plan Note (Signed)
New- intertrigo. Nystatin- triamcinolone ointment to area twice daily x 7- 10 days. Call or return to clinic prn if these symptoms worsen or fail to improve as anticipated. The patient indicates understanding of these issues and agrees with the plan.

## 2017-08-31 DIAGNOSIS — R69 Illness, unspecified: Secondary | ICD-10-CM | POA: Diagnosis not present

## 2017-09-02 ENCOUNTER — Ambulatory Visit: Payer: Medicare HMO | Admitting: Family Medicine

## 2017-09-02 ENCOUNTER — Ambulatory Visit (INDEPENDENT_AMBULATORY_CARE_PROVIDER_SITE_OTHER)
Admission: RE | Admit: 2017-09-02 | Discharge: 2017-09-02 | Disposition: A | Discharge status: Home / Self Care | Payer: Medicare HMO | Source: Ambulatory Visit | Attending: Family Medicine | Admitting: Family Medicine

## 2017-09-02 ENCOUNTER — Encounter: Payer: Self-pay | Admitting: Family Medicine

## 2017-09-02 VITALS — BP 122/68 | HR 113 | Temp 97.8°F | Ht 61.5 in | Wt 144.2 lb

## 2017-09-02 DIAGNOSIS — M25561 Pain in right knee: Secondary | ICD-10-CM

## 2017-09-02 DIAGNOSIS — G8929 Other chronic pain: Secondary | ICD-10-CM

## 2017-09-02 DIAGNOSIS — R69 Illness, unspecified: Secondary | ICD-10-CM | POA: Diagnosis not present

## 2017-09-02 DIAGNOSIS — M179 Osteoarthritis of knee, unspecified: Secondary | ICD-10-CM | POA: Diagnosis not present

## 2017-09-02 DIAGNOSIS — M1611 Unilateral primary osteoarthritis, right hip: Secondary | ICD-10-CM | POA: Diagnosis not present

## 2017-09-02 DIAGNOSIS — R1031 Right lower quadrant pain: Secondary | ICD-10-CM | POA: Diagnosis not present

## 2017-09-02 NOTE — Assessment & Plan Note (Signed)
Since fall a month ago (but pain was not acute) No swelling  Also c/o of R groin pain  Exam- medial tenderness and limited rom  Xray today of this and hip  Tylenol/heat prn Plan to follow after result

## 2017-09-02 NOTE — Patient Instructions (Signed)
Continue tylenol and heat as needed We will xray your hip for the groin pain /hip pain you have to rule out arthritis or injury  We will xray your knee to rule out the same  Elevate leg if it helps   We will make a plan when results return  Keep eating a healthy diet and drink your water

## 2017-09-02 NOTE — Assessment & Plan Note (Signed)
Pain in r groin since fall a month ago with reduced rom of R hip  She denies immediate injury and could walk after the fall  Had a rash in that area briefly but doubt shingles since it resolved with lotrisone  Unsure if related to her medial knee pain as well  Check xr of R hip and knee today Tylenol/heat prn  Plan with result
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
## 2017-09-03 ENCOUNTER — Telehealth: Payer: Self-pay | Admitting: Family Medicine

## 2017-09-03 DIAGNOSIS — M179 Osteoarthritis of knee, unspecified: Secondary | ICD-10-CM | POA: Insufficient documentation

## 2017-09-03 DIAGNOSIS — M1710 Unilateral primary osteoarthritis, unspecified knee: Secondary | ICD-10-CM | POA: Insufficient documentation

## 2017-09-03 DIAGNOSIS — M1610 Unilateral primary osteoarthritis, unspecified hip: Secondary | ICD-10-CM | POA: Insufficient documentation

## 2017-09-03 DIAGNOSIS — M1711 Unilateral primary osteoarthritis, right knee: Secondary | ICD-10-CM

## 2017-09-03 NOTE — Telephone Encounter (Signed)
Ref done  Will route to [facility] 

## 2017-09-04 ENCOUNTER — Telehealth: Payer: Self-pay | Admitting: Family Medicine

## 2017-09-04 NOTE — Telephone Encounter (Signed)
Pt. Returned call.  Stated  She is willing to be referred to Ortho, but prefers to have the appt. scheduled in January.  Will make Dr. Glori Bickers aware.   Copied from [place]. Topic: Quick Communication - Lab Results >> Sep 03, 2017 11:08 AM Tammi Sou, CMA wrote: Called patient to inform them of xray results. When patient returns call, triage nurse may disclose results, see if she is okay with getting Ortho referral (xray results routed to Triage nurse)

## 2017-09-05 NOTE — Telephone Encounter (Signed)
Appt made and patient aware.  

## 2017-10-01 ENCOUNTER — Other Ambulatory Visit: Payer: Self-pay | Admitting: Family Medicine

## 2017-10-01 DIAGNOSIS — R69 Illness, unspecified: Secondary | ICD-10-CM | POA: Diagnosis not present

## 2017-10-02 DIAGNOSIS — M1711 Unilateral primary osteoarthritis, right knee: Secondary | ICD-10-CM | POA: Diagnosis not present

## 2017-10-02 DIAGNOSIS — M1611 Unilateral primary osteoarthritis, right hip: Secondary | ICD-10-CM | POA: Diagnosis not present

## 2017-10-14 ENCOUNTER — Ambulatory Visit (INDEPENDENT_AMBULATORY_CARE_PROVIDER_SITE_OTHER): Payer: Medicare HMO | Admitting: Family Medicine

## 2017-10-14 ENCOUNTER — Encounter: Payer: Self-pay | Admitting: Family Medicine

## 2017-10-14 ENCOUNTER — Ambulatory Visit: Payer: Self-pay

## 2017-10-14 VITALS — BP 130/80 | HR 124 | Temp 97.5°F | Ht 61.5 in | Wt 146.5 lb

## 2017-10-14 DIAGNOSIS — I10 Essential (primary) hypertension: Secondary | ICD-10-CM | POA: Diagnosis not present

## 2017-10-14 DIAGNOSIS — C50512 Malignant neoplasm of lower-outer quadrant of left female breast: Secondary | ICD-10-CM | POA: Diagnosis not present

## 2017-10-14 DIAGNOSIS — L304 Erythema intertrigo: Secondary | ICD-10-CM

## 2017-10-14 DIAGNOSIS — E1165 Type 2 diabetes mellitus with hyperglycemia: Secondary | ICD-10-CM | POA: Diagnosis not present

## 2017-10-14 DIAGNOSIS — Z170 Estrogen receptor positive status [ER+]: Secondary | ICD-10-CM

## 2017-10-14 MED ORDER — NYSTATIN 100000 UNIT/GM EX POWD: Freq: Two times a day (BID) | CUTANEOUS | 1 refills | Status: DC

## 2017-10-14 NOTE — Telephone Encounter (Signed)
Spoke to patient by telephone and worked in with Dr. Glori Bickers today at [facility].

## 2017-10-14 NOTE — Telephone Encounter (Signed)
I will see her then  
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
## 2017-10-14 NOTE — Telephone Encounter (Signed)
  Reason for Disposition . [1] Localized rash is very painful AND [2] no fever  Answer Assessment - Initial Assessment Questions 1. APPEARANCE of RASH: "Describe the rash."      Red, flat area 2. LOCATION: "Where is the rash located?"      Center of chest across left breast are to [place] 3. NUMBER: "How many spots are there?"      One area 4. SIZE: "How big are the spots?" (Inches, centimeters or compare to size of a coin)      Large area 5. ONSET: "When did the rash start?"      Started 4 days ago 6. ITCHING: "Does the rash itch?" If so, ask: "How bad is the itch?"  (Scale 1-10; or mild, moderate, severe)     Moderate 7. PAIN: "Does the rash hurt?" If so, ask: "How bad is the pain?"  (Scale 1-10; or mild, moderate, severe)     Moderate 8. OTHER SYMPTOMS: "Do you have any other symptoms?" (e.g., fever)     No 9. PREGNANCY: "Is there any chance you are pregnant?" "When was your last menstrual period?"     No  Protocols used: RASH OR REDNESS - [place] is on her left side/breast area - cancer site.

## 2017-10-14 NOTE — Patient Instructions (Addendum)
I think you have a fungal skin infection under your breast  Keep clean with soap and water  Dry very very well  Then apply the nystatin-triamcinolone ointment you have at home as directed  Once it is resolved - you can use the nystatin powder under both breasts any time to prevent it from coming back   Take care of yourself  Wash your linens   Update if not starting to improve in a week or if worsening    If rash spreads or becomes more painful- alert [place]

## 2017-10-15 NOTE — Assessment & Plan Note (Signed)
Stable clinically.

## 2017-10-15 NOTE — Assessment & Plan Note (Signed)
Sees Dr Gabriel Carina

## 2017-10-15 NOTE — Assessment & Plan Note (Signed)
Pt was nervous- bp and pulse high-they improved after sitting BP: 130/80

## 2017-10-15 NOTE — Assessment & Plan Note (Signed)
Under L breast -suspect yeast  tx with the nystatin-triamcinolone she used for last bout (still has at home)  When clear will switch to nystatin powder to use for prevention  Disc keeping area clean and very dray
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
## 2017-11-18 ENCOUNTER — Inpatient Hospital Stay: Payer: Medicare HMO | Attending: Oncology | Admitting: Oncology

## 2017-11-18 VITALS — BP 152/87 | HR 117 | Temp 98.4°F | Resp 20 | Ht 61.5 in | Wt 147.4 lb

## 2017-11-18 DIAGNOSIS — Z170 Estrogen receptor positive status [ER+]: Secondary | ICD-10-CM | POA: Diagnosis not present

## 2017-11-18 DIAGNOSIS — D0512 Intraductal carcinoma in situ of left breast: Secondary | ICD-10-CM

## 2017-11-18 DIAGNOSIS — C50512 Malignant neoplasm of lower-outer quadrant of left female breast: Secondary | ICD-10-CM

## 2017-11-18 DIAGNOSIS — Z79810 Long term (current) use of selective estrogen receptor modulators (SERMs): Secondary | ICD-10-CM | POA: Diagnosis not present

## 2017-11-18 MED ORDER — TAMOXIFEN CITRATE 20 MG PO TABS: 20.0000 mg | ORAL_TABLET | Freq: Every day | ORAL | 4 refills | Status: DC

## 2017-11-21 ENCOUNTER — Other Ambulatory Visit: Payer: Self-pay | Admitting: Oncology

## 2017-11-21 ENCOUNTER — Other Ambulatory Visit: Payer: Self-pay | Admitting: General Surgery

## 2017-11-21 DIAGNOSIS — Z9889 Other specified postprocedural states: Secondary | ICD-10-CM

## 2017-11-27 ENCOUNTER — Other Ambulatory Visit: Payer: Self-pay | Admitting: Family Medicine

## 2017-11-28 DIAGNOSIS — R69 Illness, unspecified: Secondary | ICD-10-CM | POA: Diagnosis not present

## 2017-12-01 ENCOUNTER — Encounter: Payer: Self-pay | Admitting: Primary Care

## 2017-12-01 ENCOUNTER — Ambulatory Visit (INDEPENDENT_AMBULATORY_CARE_PROVIDER_SITE_OTHER): Payer: Medicare HMO | Admitting: Primary Care

## 2017-12-01 VITALS — BP 136/80 | HR 104 | Temp 98.0°F | Ht 61.5 in | Wt 145.8 lb

## 2017-12-01 DIAGNOSIS — R3915 Urgency of urination: Secondary | ICD-10-CM

## 2017-12-01 LAB — POC URINALSYSI DIPSTICK (AUTOMATED)
Bilirubin, UA: NEGATIVE
Blood, UA: NEGATIVE
KETONES UA: NEGATIVE
NITRITE UA: NEGATIVE
PH UA: 5.5 (ref 5.0–8.0)
PROTEIN UA: NEGATIVE
Spec Grav, UA: 1.015 (ref 1.010–1.025)
UROBILINOGEN UA: 0.2 U/dL

## 2017-12-01 NOTE — Patient Instructions (Signed)
We will notify you once we receive your urine test results.   Make sure to drink plenty of water.  You can try AZO over the counter for your symptoms.   Follow up with your endocrinologist as scheduled.  It was a pleasure meeting you!
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
## 2017-12-02 LAB — URINE CULTURE
MICRO NUMBER: 90275413
SPECIMEN QUALITY:: ADEQUATE

## 2017-12-04 DIAGNOSIS — E119 Type 2 diabetes mellitus without complications: Secondary | ICD-10-CM | POA: Diagnosis not present

## 2017-12-05 ENCOUNTER — Telehealth: Payer: Self-pay | Admitting: Family Medicine

## 2017-12-05 NOTE — Telephone Encounter (Signed)
Spoken and notified patient of Kate's comments. Patient verbalized understanding. 

## 2017-12-05 NOTE — Telephone Encounter (Signed)
Copied from [place]. Topic: Quick Communication - Lab Results >> Dec 05, 2017  9:36 AM Darl Householder, RMA wrote: Patient is requesting lab results, she does not use MyChart please return pt call

## 2017-12-09 DIAGNOSIS — E1165 Type 2 diabetes mellitus with hyperglycemia: Secondary | ICD-10-CM | POA: Diagnosis not present

## 2017-12-14 DIAGNOSIS — R69 Illness, unspecified: Secondary | ICD-10-CM | POA: Diagnosis not present

## 2017-12-17 ENCOUNTER — Ambulatory Visit
Admission: RE | Admit: 2017-12-17 | Discharge: 2017-12-17 | Disposition: A | Discharge status: Home / Self Care | Payer: Medicare HMO | Source: Ambulatory Visit | Attending: Oncology | Admitting: Oncology

## 2017-12-17 DIAGNOSIS — R922 Inconclusive mammogram: Secondary | ICD-10-CM | POA: Diagnosis not present

## 2017-12-17 DIAGNOSIS — Z9889 Other specified postprocedural states: Secondary | ICD-10-CM

## 2017-12-30 ENCOUNTER — Encounter: Payer: Self-pay | Admitting: Family Medicine

## 2017-12-30 ENCOUNTER — Ambulatory Visit (INDEPENDENT_AMBULATORY_CARE_PROVIDER_SITE_OTHER): Payer: Medicare HMO | Admitting: Family Medicine

## 2017-12-30 VITALS — BP 114/66 | HR 112 | Temp 96.9°F | Ht 61.5 in | Wt 144.8 lb

## 2017-12-30 DIAGNOSIS — R350 Frequency of micturition: Secondary | ICD-10-CM | POA: Diagnosis not present

## 2017-12-30 LAB — POC URINALSYSI DIPSTICK (AUTOMATED)
Bilirubin, UA: NEGATIVE
Blood, UA: NEGATIVE
KETONES UA: NEGATIVE
LEUKOCYTES UA: NEGATIVE
NITRITE UA: NEGATIVE
PH UA: 5.5 (ref 5.0–8.0)
PROTEIN UA: NEGATIVE
Spec Grav, UA: 1.025 (ref 1.010–1.025)
Urobilinogen, UA: 0.2 E.U./dL

## 2017-12-30 NOTE — Assessment & Plan Note (Signed)
This is ongoing along with suprapubic  discomfort/bladder prolapse and urge incontinence  Last ucx neg in early march (but much glucose in urine) H/o breast cancer on tamoxifen H/o hysterectomy (? Per pt still has ovaries-would consider [place] if no urol cause found)  Pt states DM is better controlled now  ua pending -with cx F/u with urology in 2 d as planned

## 2017-12-30 NOTE — Patient Instructions (Addendum)
Avoid diet drinks- they can upset bladder   Try to give a specimen now (if not - bring one back)  Let's see if there are signs of infection  Follow up with urology as planned in 2 days    Alert [place] if any changes / for example fever or nausea
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
## 2017-12-31 LAB — URINE CULTURE
MICRO NUMBER:: 90406436
Result:: NO GROWTH
SPECIMEN QUALITY:: ADEQUATE

## 2018-01-01 DIAGNOSIS — R8279 Other abnormal findings on microbiological examination of urine: Secondary | ICD-10-CM | POA: Diagnosis not present

## 2018-01-01 DIAGNOSIS — N816 Rectocele: Secondary | ICD-10-CM | POA: Diagnosis not present

## 2018-01-01 DIAGNOSIS — R351 Nocturia: Secondary | ICD-10-CM | POA: Diagnosis not present

## 2018-01-01 DIAGNOSIS — N3946 Mixed incontinence: Secondary | ICD-10-CM | POA: Diagnosis not present

## 2018-01-01 DIAGNOSIS — N815 Vaginal enterocele: Secondary | ICD-10-CM | POA: Diagnosis not present

## 2018-01-01 DIAGNOSIS — R350 Frequency of micturition: Secondary | ICD-10-CM | POA: Diagnosis not present

## 2018-01-20 DIAGNOSIS — R69 Illness, unspecified: Secondary | ICD-10-CM | POA: Diagnosis not present

## 2018-02-20 DIAGNOSIS — R351 Nocturia: Secondary | ICD-10-CM | POA: Diagnosis not present

## 2018-02-20 DIAGNOSIS — N3946 Mixed incontinence: Secondary | ICD-10-CM | POA: Diagnosis not present

## 2018-03-02 ENCOUNTER — Other Ambulatory Visit: Payer: Self-pay | Admitting: Family Medicine

## 2018-03-03 DIAGNOSIS — R69 Illness, unspecified: Secondary | ICD-10-CM | POA: Diagnosis not present

## 2018-03-10 DIAGNOSIS — R69 Illness, unspecified: Secondary | ICD-10-CM | POA: Diagnosis not present

## 2018-04-01 ENCOUNTER — Encounter: Payer: Self-pay | Admitting: Family Medicine

## 2018-04-01 ENCOUNTER — Ambulatory Visit (INDEPENDENT_AMBULATORY_CARE_PROVIDER_SITE_OTHER): Payer: Medicare HMO | Admitting: Family Medicine

## 2018-04-01 ENCOUNTER — Ambulatory Visit (INDEPENDENT_AMBULATORY_CARE_PROVIDER_SITE_OTHER)
Admission: RE | Admit: 2018-04-01 | Discharge: 2018-04-01 | Disposition: A | Discharge status: Home / Self Care | Payer: Medicare HMO | Source: Ambulatory Visit | Attending: Family Medicine | Admitting: Family Medicine

## 2018-04-01 VITALS — BP 136/78 | HR 118 | Temp 97.6°F | Ht 61.5 in | Wt 144.2 lb

## 2018-04-01 DIAGNOSIS — M1611 Unilateral primary osteoarthritis, right hip: Secondary | ICD-10-CM | POA: Diagnosis not present

## 2018-04-01 DIAGNOSIS — R5382 Chronic fatigue, unspecified: Secondary | ICD-10-CM

## 2018-04-01 DIAGNOSIS — R2689 Other abnormalities of gait and mobility: Secondary | ICD-10-CM

## 2018-04-01 DIAGNOSIS — F419 Anxiety disorder, unspecified: Secondary | ICD-10-CM | POA: Insufficient documentation

## 2018-04-01 DIAGNOSIS — R4589 Other symptoms and signs involving emotional state: Secondary | ICD-10-CM

## 2018-04-01 DIAGNOSIS — R1031 Right lower quadrant pain: Secondary | ICD-10-CM

## 2018-04-01 DIAGNOSIS — R69 Illness, unspecified: Secondary | ICD-10-CM | POA: Diagnosis not present

## 2018-04-01 MED ORDER — CITALOPRAM HYDROBROMIDE 20 MG PO TABS: 20.0000 mg | ORAL_TABLET | Freq: Every day | ORAL | 11 refills | Status: DC
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
## 2018-04-01 NOTE — Patient Instructions (Addendum)
Call the urology office to get clarification on questions you still have  Especially discuss the urinary pain  Try celexa each night for depression and sleep  If any intolerable side effects or if you feel worse -stop it and let me know   We will refer you to PT for balance   Labs today for fatigue   Xray of hip today- I think that hip arthritis is causing groin pain

## 2018-04-03 ENCOUNTER — Telehealth: Payer: Self-pay | Admitting: *Deleted

## 2018-04-03 ENCOUNTER — Other Ambulatory Visit (INDEPENDENT_AMBULATORY_CARE_PROVIDER_SITE_OTHER): Payer: Medicare HMO

## 2018-04-03 DIAGNOSIS — E538 Deficiency of other specified B group vitamins: Secondary | ICD-10-CM

## 2018-04-03 DIAGNOSIS — R5382 Chronic fatigue, unspecified: Secondary | ICD-10-CM

## 2018-04-03 DIAGNOSIS — R5381 Other malaise: Secondary | ICD-10-CM | POA: Diagnosis not present

## 2018-04-03 LAB — CBC WITH DIFFERENTIAL/PLATELET
Basophils Absolute: 0.1 10*3/uL (ref 0.0–0.1)
Basophils Relative: 0.9 % (ref 0.0–3.0)
Eosinophils Absolute: 0.2 10*3/uL (ref 0.0–0.7)
Eosinophils Relative: 2.2 % (ref 0.0–5.0)
HCT: 38.9 % (ref 36.0–46.0)
Hemoglobin: 12.9 g/dL (ref 12.0–15.0)
LYMPHS ABS: 1.6 10*3/uL (ref 0.7–4.0)
Lymphocytes Relative: 20.1 % (ref 12.0–46.0)
MCHC: 33.1 g/dL (ref 30.0–36.0)
MCV: 89.5 fl (ref 78.0–100.0)
MONO ABS: 0.7 10*3/uL (ref 0.1–1.0)
Monocytes Relative: 8.6 % (ref 3.0–12.0)
NEUTROS ABS: 5.2 10*3/uL (ref 1.4–7.7)
NEUTROS PCT: 68.2 % (ref 43.0–77.0)
PLATELETS: 265 10*3/uL (ref 150.0–400.0)
RBC: 4.34 Mil/uL (ref 3.87–5.11)
RDW: 13.1 % (ref 11.5–15.5)
WBC: 7.7 10*3/uL (ref 4.0–10.5)

## 2018-04-03 LAB — COMPREHENSIVE METABOLIC PANEL
ALT: 12 U/L (ref 0–35)
AST: 14 U/L (ref 0–37)
Albumin: 3.9 g/dL (ref 3.5–5.2)
Alkaline Phosphatase: 44 U/L (ref 39–117)
BILIRUBIN TOTAL: 0.4 mg/dL (ref 0.2–1.2)
BUN: 6 mg/dL (ref 6–23)
CHLORIDE: 98 meq/L (ref 96–112)
CO2: 27 meq/L (ref 19–32)
CREATININE: 0.6 mg/dL (ref 0.40–1.20)
Calcium: 9.4 mg/dL (ref 8.4–10.5)
GFR: 102.24 mL/min (ref >60.00)
Glucose, Bld: 269 mg/dL — ABNORMAL HIGH (ref 70–99)
Potassium: 4.2 mEq/L (ref 3.5–5.1)
SODIUM: 133 meq/L — AB (ref 135–145)
Total Protein: 6.5 g/dL (ref 6.0–8.3)

## 2018-04-03 LAB — TSH: TSH: 3.23 u[IU]/mL (ref 0.35–4.50)

## 2018-04-03 LAB — VITAMIN B12: VITAMIN B 12: 102 pg/mL — AB (ref 211–911)

## 2018-04-03 NOTE — Assessment & Plan Note (Signed)
Suspect multifactorial Lab today Disc role of depression-will start tx with celexa 20 mg as tolerated F/u planned  Reassuring exam

## 2018-04-03 NOTE — Assessment & Plan Note (Signed)
With lack of motivation/poor sleep and some worry Reviewed stressors/ coping techniques/symptoms/ support sources/ tx options and side effects in detail today  Will try low dose celexa  Discussed expectations of SSRI medication including time to effectiveness and mechanism of action, also poss of side effects (early and late)- including mental fuzziness, weight or appetite change, nausea and poss of worse dep or anxiety (even suicidal thoughts)  Pt voiced understanding and will stop med and update if this occurs   F/u planned  Disc imp of good self care and exercise as tolerated along with socialization

## 2018-04-03 NOTE — Assessment & Plan Note (Signed)
Likely multifactorial Reassuring exam  Ref for PT for balance  Then re assess

## 2018-04-03 NOTE — Telephone Encounter (Signed)
b12 meds updated and problem list updated

## 2018-04-03 NOTE — Assessment & Plan Note (Signed)
Strongly suspect hip OA  Xray today

## 2018-04-03 NOTE — Addendum Note (Signed)
Addended by: Ellamae Sia on: 04/03/2018 09:50 AM   Modules accepted: Orders

## 2018-04-08 DIAGNOSIS — E1165 Type 2 diabetes mellitus with hyperglycemia: Secondary | ICD-10-CM | POA: Diagnosis not present

## 2018-04-09 ENCOUNTER — Ambulatory Visit (INDEPENDENT_AMBULATORY_CARE_PROVIDER_SITE_OTHER): Payer: Medicare HMO

## 2018-04-09 DIAGNOSIS — E538 Deficiency of other specified B group vitamins: Secondary | ICD-10-CM

## 2018-04-09 MED ORDER — CYANOCOBALAMIN 1000 MCG/ML IJ SOLN
1000.0000 ug | Freq: Once | INTRAMUSCULAR | Status: AC
  Administered 2018-04-09: 1000 ug via INTRAMUSCULAR

## 2018-04-09 NOTE — Progress Notes (Signed)
Per orders of Dr. Glori Bickers, injection of vitamin M38 given by Brenton Grills. Patient tolerated injection well.

## 2018-04-15 DIAGNOSIS — E119 Type 2 diabetes mellitus without complications: Secondary | ICD-10-CM | POA: Diagnosis not present

## 2018-04-15 DIAGNOSIS — E1165 Type 2 diabetes mellitus with hyperglycemia: Secondary | ICD-10-CM | POA: Diagnosis not present

## 2018-04-16 ENCOUNTER — Ambulatory Visit (INDEPENDENT_AMBULATORY_CARE_PROVIDER_SITE_OTHER): Payer: Medicare HMO | Admitting: *Deleted

## 2018-04-16 DIAGNOSIS — E538 Deficiency of other specified B group vitamins: Secondary | ICD-10-CM

## 2018-04-16 MED ORDER — CYANOCOBALAMIN 1000 MCG/ML IJ SOLN
1000.0000 ug | Freq: Once | INTRAMUSCULAR | Status: AC
  Administered 2018-04-16: 1000 ug via INTRAMUSCULAR

## 2018-04-16 NOTE — Progress Notes (Signed)
Per orders of Dr. Glori Bickers, injection of Vitamin B12  given by Christena Deem. Patient tolerated injection well.

## 2018-04-19 DIAGNOSIS — R69 Illness, unspecified: Secondary | ICD-10-CM | POA: Diagnosis not present

## 2018-04-23 ENCOUNTER — Ambulatory Visit (INDEPENDENT_AMBULATORY_CARE_PROVIDER_SITE_OTHER): Payer: Medicare HMO

## 2018-04-23 DIAGNOSIS — E538 Deficiency of other specified B group vitamins: Secondary | ICD-10-CM | POA: Diagnosis not present

## 2018-04-23 MED ORDER — CYANOCOBALAMIN 1000 MCG/ML IJ SOLN
1000.0000 ug | Freq: Once | INTRAMUSCULAR | Status: AC
  Administered 2018-04-23: 1000 ug via INTRAMUSCULAR

## 2018-04-23 NOTE — Progress Notes (Signed)
Per orders of Dr. Tower, injection of Vitamin B 12 given by Rena Isley. Patient tolerated injection well. 

## 2018-04-30 ENCOUNTER — Ambulatory Visit (INDEPENDENT_AMBULATORY_CARE_PROVIDER_SITE_OTHER): Payer: Medicare HMO | Admitting: *Deleted

## 2018-04-30 DIAGNOSIS — E538 Deficiency of other specified B group vitamins: Secondary | ICD-10-CM | POA: Diagnosis not present

## 2018-04-30 MED ORDER — CYANOCOBALAMIN 1000 MCG/ML IJ SOLN
1000.0000 ug | Freq: Once | INTRAMUSCULAR | Status: AC
  Administered 2018-04-30: 1000 ug via INTRAMUSCULAR

## 2018-04-30 NOTE — Progress Notes (Signed)
Per orders of Dr. Damita Dunnings, injection of B12  given by Tammi Sou. Patient tolerated injection well.  Dr. Glori Bickers out of the office

## 2018-05-14 ENCOUNTER — Telehealth: Payer: Self-pay | Admitting: Family Medicine

## 2018-05-14 DIAGNOSIS — E782 Mixed hyperlipidemia: Secondary | ICD-10-CM

## 2018-05-14 DIAGNOSIS — R69 Illness, unspecified: Secondary | ICD-10-CM | POA: Diagnosis not present

## 2018-05-14 DIAGNOSIS — I10 Essential (primary) hypertension: Secondary | ICD-10-CM

## 2018-05-14 DIAGNOSIS — E1165 Type 2 diabetes mellitus with hyperglycemia: Secondary | ICD-10-CM

## 2018-05-14 DIAGNOSIS — E538 Deficiency of other specified B group vitamins: Secondary | ICD-10-CM

## 2018-05-14 NOTE — Telephone Encounter (Signed)
-----   Message from Eustace Pen, LPN sent at 4/72/0721  3:51 PM EDT ----- Regarding: Labs 8/16 Lab orders needed. Thank you.  Insurance:  [employer]

## 2018-05-15 ENCOUNTER — Ambulatory Visit: Payer: Medicare HMO

## 2018-05-15 ENCOUNTER — Ambulatory Visit (INDEPENDENT_AMBULATORY_CARE_PROVIDER_SITE_OTHER): Payer: Medicare HMO

## 2018-05-15 VITALS — BP 136/78 | HR 102 | Temp 97.9°F | Ht 61.25 in | Wt 142.0 lb

## 2018-05-15 DIAGNOSIS — E782 Mixed hyperlipidemia: Secondary | ICD-10-CM | POA: Diagnosis not present

## 2018-05-15 DIAGNOSIS — E1165 Type 2 diabetes mellitus with hyperglycemia: Secondary | ICD-10-CM

## 2018-05-15 DIAGNOSIS — Z0000 Encounter for general adult medical examination without abnormal findings: Secondary | ICD-10-CM

## 2018-05-15 DIAGNOSIS — E538 Deficiency of other specified B group vitamins: Secondary | ICD-10-CM | POA: Diagnosis not present

## 2018-05-15 DIAGNOSIS — I10 Essential (primary) hypertension: Secondary | ICD-10-CM

## 2018-05-15 DIAGNOSIS — IMO0001 Reserved for inherently not codable concepts without codable children: Secondary | ICD-10-CM

## 2018-05-15 LAB — LIPID PANEL
Cholesterol: 147 mg/dL (ref 0–200)
HDL: 55.5 mg/dL (ref >39.00)
LDL CALC: 75 mg/dL (ref 0–99)
NonHDL: 91.93
Total CHOL/HDL Ratio: 3
Triglycerides: 85 mg/dL (ref 0.0–149.0)
VLDL: 17 mg/dL (ref 0.0–40.0)

## 2018-05-15 LAB — CBC WITH DIFFERENTIAL/PLATELET
Basophils Absolute: 0.1 10*3/uL (ref 0.0–0.1)
Basophils Relative: 0.9 % (ref 0.0–3.0)
EOS PCT: 1.3 % (ref 0.0–5.0)
Eosinophils Absolute: 0.1 10*3/uL (ref 0.0–0.7)
HEMATOCRIT: 38.3 % (ref 36.0–46.0)
Hemoglobin: 12.6 g/dL (ref 12.0–15.0)
LYMPHS PCT: 17.4 % (ref 12.0–46.0)
Lymphs Abs: 1.2 10*3/uL (ref 0.7–4.0)
MCHC: 32.9 g/dL (ref 30.0–36.0)
MCV: 89 fl (ref 78.0–100.0)
MONOS PCT: 7.3 % (ref 3.0–12.0)
Monocytes Absolute: 0.5 10*3/uL (ref 0.1–1.0)
Neutro Abs: 5.2 10*3/uL (ref 1.4–7.7)
Neutrophils Relative %: 73.1 % (ref 43.0–77.0)
Platelets: 250 10*3/uL (ref 150.0–400.0)
RBC: 4.3 Mil/uL (ref 3.87–5.11)
RDW: 13.1 % (ref 11.5–15.5)
WBC: 7 10*3/uL (ref 4.0–10.5)

## 2018-05-15 LAB — TSH: TSH: 3.54 u[IU]/mL (ref 0.35–4.50)

## 2018-05-15 LAB — COMPREHENSIVE METABOLIC PANEL
ALBUMIN: 3.9 g/dL (ref 3.5–5.2)
ALT: 11 U/L (ref 0–35)
AST: 13 U/L (ref 0–37)
Alkaline Phosphatase: 38 U/L — ABNORMAL LOW (ref 39–117)
BUN: 11 mg/dL (ref 6–23)
CALCIUM: 9.7 mg/dL (ref 8.4–10.5)
CHLORIDE: 102 meq/L (ref 96–112)
CO2: 29 mEq/L (ref 19–32)
Creatinine, Ser: 0.7 mg/dL (ref 0.40–1.20)
GFR: 85.56 mL/min (ref >60.00)
Glucose, Bld: 224 mg/dL — ABNORMAL HIGH (ref 70–99)
POTASSIUM: 4.5 meq/L (ref 3.5–5.1)
SODIUM: 137 meq/L (ref 135–145)
Total Bilirubin: 0.4 mg/dL (ref 0.2–1.2)
Total Protein: 6.4 g/dL (ref 6.0–8.3)

## 2018-05-15 LAB — HEMOGLOBIN A1C: Hgb A1c MFr Bld: 8.1 % — ABNORMAL HIGH (ref 4.6–6.5)

## 2018-05-15 LAB — VITAMIN B12: Vitamin B-12: 426 pg/mL (ref 211–911)

## 2018-05-15 NOTE — Progress Notes (Signed)
PCP notes:   Health maintenance:  Foot exam - PCP please address at next appt Flu vaccine - addressed A1C - completed  Abnormal screenings:   Hearing - failed  Hearing Screening   125Hz  250Hz  500Hz  1000Hz  2000Hz  3000Hz  4000Hz  6000Hz  8000Hz   Right ear:   40 40 0  0    Left ear:   40 40 40  0     Fall risk - hx of single fall Fall Risk  05/15/2018 05/01/2017 04/22/2016 08/31/2013 09/01/2012  Falls in the past year? Yes No No No No  Comment pt lost balance while trying to avoid stepping on granddaughter - - - -  Number falls in past yr: 1 - - - -  Injury with Fall? No - - - -  Risk for fall due to : Impaired balance/gait;Impaired mobility - - - -   Depression score: 12 Depression screen [facility] 2/9 05/15/2018 05/01/2017 04/22/2016 08/31/2013 04/28/2013  Decreased Interest 0 0 0 0 0  Down, Depressed, Hopeless 1 0 0 0 0  PHQ - 2 Score 1 0 0 0 0  Altered sleeping 3 - - - -  Tired, decreased energy 3 - - - -  Change in appetite 3 - - - -  Feeling bad or failure about yourself  0 - - - -  Trouble concentrating 2 - - - -  Moving slowly or fidgety/restless 0 - - - -  Suicidal thoughts 0 - - - -  PHQ-9 Score 12 - - - -  Difficult doing work/chores Not difficult at all - - - -   Patient concerns:   None  Nurse concerns:  None  Next PCP appt:   05/22/18 @ 1130  I reviewed health advisor's note, was available for consultation, and agree with documentation and plan. Loura Pardon MD

## 2018-05-15 NOTE — Patient Instructions (Signed)
Laura Bell , Thank you for taking time to come for your Medicare Wellness Visit. I appreciate your ongoing commitment to your health goals. Please review the following plan we discussed and let me know if I can assist you in the future.   These are the goals we discussed: Goals    . Increase physical activity     Starting 04/22/2016, I will continue to walk at least 15 min daily.  05/01/2017, I will continue to walk at least 15 min daily.        This is a list of the screening recommended for you and due dates:  Health Maintenance  Topic Date Due  . Complete foot exam   05/22/2018*  . Flu Shot  12/29/2018*  . Eye exam for diabetics  05/29/2018  . Hemoglobin A1C  11/15/2018  . Mammogram  12/18/2018  . Tetanus Vaccine  04/11/2021  . DEXA scan (bone density measurement)  Completed  . Pneumonia vaccines  Completed  *Topic was postponed. The date shown is not the original due date.   Preventive Care for Adults  A healthy lifestyle and preventive care can promote health and wellness. Preventive health guidelines for adults include the following key practices.  . A routine yearly physical is a good way to check with your health care provider about your health and preventive screening. It is a chance to share any concerns and updates on your health and to receive a thorough exam.  . Visit your dentist for a routine exam and preventive care every 6 months. Brush your teeth twice a day and floss once a day. Good oral hygiene prevents tooth decay and gum disease.  . The frequency of eye exams is based on your age, health, family medical history, use  of contact lenses, and other factors. Follow your health care provider's recommendations for frequency of eye exams.  . Eat a healthy diet. Foods like vegetables, fruits, whole grains, low-fat dairy products, and lean protein foods contain the nutrients you need without too many calories. Decrease your intake of foods high in solid fats, added  sugars, and salt. Eat the right amount of calories for you. Get information about a proper diet from your health care provider, if necessary.  . Regular physical exercise is one of the most important things you can do for your health. Most adults should get at least 150 minutes of moderate-intensity exercise (any activity that increases your heart rate and causes you to sweat) each week. In addition, most adults need muscle-strengthening exercises on 2 or more days a week.  Silver Sneakers may be a benefit available to you. To determine eligibility, you may visit the website: www.silversneakers.com or contact program at (867) 647-5254 Mon-Fri between 8AM-8PM.   . Maintain a healthy weight. The body mass index (BMI) is a screening tool to identify possible weight problems. It provides an estimate of body fat based on height and weight. Your health care provider can find your BMI and can help you achieve or maintain a healthy weight.   For adults 20 years and older: ? A BMI below 18.5 is considered underweight. ? A BMI of 18.5 to 24.9 is normal. ? A BMI of 25 to 29.9 is considered overweight. ? A BMI of 30 and above is considered obese.   . Maintain normal blood lipids and cholesterol levels by exercising and minimizing your intake of saturated fat. Eat a balanced diet with plenty of fruit and vegetables. Blood tests for lipids and cholesterol should  begin at age 27 and be repeated every 5 years. If your lipid or cholesterol levels are high, you are over 50, or you are at high risk for heart disease, you may need your cholesterol levels checked more frequently. Ongoing high lipid and cholesterol levels should be treated with medicines if diet and exercise are not working.  . If you smoke, find out from your health care provider how to quit. If you do not use tobacco, please do not start.  . If you choose to drink alcohol, please do not consume more than 2 drinks per day. One drink is considered to  be 12 ounces (355 mL) of beer, 5 ounces (148 mL) of wine, or 1.5 ounces (44 mL) of liquor.  . If you are 39-75 years old, ask your health care provider if you should take aspirin to prevent strokes.  . Use sunscreen. Apply sunscreen liberally and repeatedly throughout the day. You should seek shade when your shadow is shorter than you. Protect yourself by wearing long sleeves, pants, a wide-brimmed hat, and sunglasses year round, whenever you are outdoors.  . Once a month, do a whole body skin exam, using a mirror to look at the skin on your back. Tell your health care provider of new moles, moles that have irregular borders, moles that are larger than a pencil eraser, or moles that have changed in shape or color.
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
## 2018-05-22 ENCOUNTER — Encounter: Payer: Self-pay | Admitting: Family Medicine

## 2018-05-22 ENCOUNTER — Ambulatory Visit (INDEPENDENT_AMBULATORY_CARE_PROVIDER_SITE_OTHER): Payer: Medicare HMO | Admitting: Family Medicine

## 2018-05-22 VITALS — BP 130/70 | HR 90 | Temp 98.0°F | Ht 61.25 in | Wt 142.8 lb

## 2018-05-22 DIAGNOSIS — E1165 Type 2 diabetes mellitus with hyperglycemia: Secondary | ICD-10-CM | POA: Diagnosis not present

## 2018-05-22 DIAGNOSIS — M8589 Other specified disorders of bone density and structure, multiple sites: Secondary | ICD-10-CM | POA: Diagnosis not present

## 2018-05-22 DIAGNOSIS — Z170 Estrogen receptor positive status [ER+]: Secondary | ICD-10-CM

## 2018-05-22 DIAGNOSIS — E538 Deficiency of other specified B group vitamins: Secondary | ICD-10-CM | POA: Diagnosis not present

## 2018-05-22 DIAGNOSIS — Z0000 Encounter for general adult medical examination without abnormal findings: Secondary | ICD-10-CM

## 2018-05-22 DIAGNOSIS — I10 Essential (primary) hypertension: Secondary | ICD-10-CM

## 2018-05-22 DIAGNOSIS — E782 Mixed hyperlipidemia: Secondary | ICD-10-CM

## 2018-05-22 DIAGNOSIS — C50512 Malignant neoplasm of lower-outer quadrant of left female breast: Secondary | ICD-10-CM

## 2018-05-22 DIAGNOSIS — E2839 Other primary ovarian failure: Secondary | ICD-10-CM

## 2018-05-22 MED ORDER — CITALOPRAM HYDROBROMIDE 20 MG PO TABS: 20.0000 mg | ORAL_TABLET | Freq: Every day | ORAL | 3 refills | Status: DC

## 2018-05-22 MED ORDER — LISINOPRIL 40 MG PO TABS: 40.0000 mg | ORAL_TABLET | Freq: Every day | ORAL | 3 refills | Status: DC

## 2018-05-22 MED ORDER — SIMVASTATIN 20 MG PO TABS
ORAL_TABLET | ORAL | 3 refills | Status: DC
Start: 2018-05-22 — End: 2019-05-25

## 2018-05-22 MED ORDER — RANITIDINE HCL 150 MG PO TABS: 150.0000 mg | ORAL_TABLET | Freq: Two times a day (BID) | ORAL | 3 refills | Status: DC

## 2018-05-22 NOTE — Assessment & Plan Note (Signed)
Lab Results  Component Value Date   HGBA1C 8.1 (H) 05/15/2018   Continues to f/u Dr Chalmers Cater  Trying to eat better She plans to schedule her eye exam- due this month  On ace for renal protection

## 2018-05-22 NOTE — Assessment & Plan Note (Signed)
Disc goals for lipids and reasons to control them Rev last labs with pt Rev low sat fat diet in detail Continue simvastatin-well controlled  LDL of 75

## 2018-05-22 NOTE — Assessment & Plan Note (Signed)
Continues oncol and surgeon f/u  No c/o  Hopes to stop tamxifen soon  Watching bone density

## 2018-05-22 NOTE — Patient Instructions (Addendum)
Get a B12 shot in early September then get one month  Don't forget a flu shot in the fall - October is a good time   Call to schedule your eye exam -it is due soon   Follow up with Dr Marlou Starks and oncology when it is time

## 2018-05-22 NOTE — Assessment & Plan Note (Signed)
bp in fair control at this time  BP Readings from Last 1 Encounters:  05/22/18 130/70   No changes needed Most recent labs reviewed  Disc lifstyle change with low sodium diet and exercise

## 2018-05-22 NOTE — Assessment & Plan Note (Signed)
Had 4 shots  Lab Results  Component Value Date   VITAMINB12 426 05/15/2018   Energy level is improved Continues 1000 mcg daily oral  Will get B12 shot monthly
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
## 2018-05-22 NOTE — Assessment & Plan Note (Signed)
Due soon for 4 y dexa-will schedule that  No fractures Disc need for calcium/ vitamin D/ wt bearing exercise and bone density test every 2 y to monitor Disc safety/ fracture risk in detail    Taking vit D Should finish tamoxifen soon

## 2018-05-22 NOTE — Assessment & Plan Note (Signed)
Reviewed health habits including diet and exercise and skin cancer prevention Reviewed appropriate screening tests for age  Also reviewed health mt list, fam hx and immunization status , as well as social and family history   amw rev  Pt declines hearing aides Disc fall prevention dexa ordered She plans flu shot in October  F/u with oncologist and surgeon for breast cancer and breast exam  Pt will schedule eye exam soon Labs reviewed  Continue f/u with endo for DM2

## 2018-06-17 ENCOUNTER — Ambulatory Visit (INDEPENDENT_AMBULATORY_CARE_PROVIDER_SITE_OTHER): Payer: Medicare HMO

## 2018-06-17 DIAGNOSIS — R69 Illness, unspecified: Secondary | ICD-10-CM | POA: Diagnosis not present

## 2018-06-17 DIAGNOSIS — E538 Deficiency of other specified B group vitamins: Secondary | ICD-10-CM | POA: Diagnosis not present

## 2018-06-17 MED ORDER — CYANOCOBALAMIN 1000 MCG/ML IJ SOLN
1000.0000 ug | Freq: Once | INTRAMUSCULAR | Status: AC
  Administered 2018-06-17: 1000 ug via INTRAMUSCULAR

## 2018-06-17 NOTE — Progress Notes (Signed)
Patient was given her monthly B12 injection today. Tolerated well.

## 2018-06-22 ENCOUNTER — Ambulatory Visit
Admission: RE | Admit: 2018-06-22 | Discharge: 2018-06-22 | Disposition: A | Discharge status: Home / Self Care | Payer: Medicare HMO | Source: Ambulatory Visit | Attending: Family Medicine | Admitting: Family Medicine

## 2018-06-22 DIAGNOSIS — Z780 Asymptomatic menopausal state: Secondary | ICD-10-CM | POA: Diagnosis not present

## 2018-06-22 DIAGNOSIS — M8589 Other specified disorders of bone density and structure, multiple sites: Secondary | ICD-10-CM | POA: Diagnosis not present

## 2018-06-22 DIAGNOSIS — E2839 Other primary ovarian failure: Secondary | ICD-10-CM | POA: Diagnosis not present

## 2018-06-29 DIAGNOSIS — D0512 Intraductal carcinoma in situ of left breast: Secondary | ICD-10-CM | POA: Diagnosis not present

## 2018-07-06 ENCOUNTER — Telehealth: Payer: Self-pay | Admitting: *Deleted

## 2018-07-06 ENCOUNTER — Encounter: Payer: Self-pay | Admitting: *Deleted

## 2018-07-06 ENCOUNTER — Emergency Department
Admission: EM | Admit: 2018-07-06 | Discharge: 2018-07-07 | Disposition: A | Discharge status: Home / Self Care | Payer: Medicare HMO | Attending: Emergency Medicine | Admitting: Emergency Medicine

## 2018-07-06 ENCOUNTER — Other Ambulatory Visit: Payer: Self-pay

## 2018-07-06 DIAGNOSIS — Z7984 Long term (current) use of oral hypoglycemic drugs: Secondary | ICD-10-CM | POA: Insufficient documentation

## 2018-07-06 DIAGNOSIS — I10 Essential (primary) hypertension: Secondary | ICD-10-CM | POA: Diagnosis not present

## 2018-07-06 DIAGNOSIS — R300 Dysuria: Secondary | ICD-10-CM | POA: Diagnosis not present

## 2018-07-06 DIAGNOSIS — Z79899 Other long term (current) drug therapy: Secondary | ICD-10-CM | POA: Diagnosis not present

## 2018-07-06 DIAGNOSIS — Z7982 Long term (current) use of aspirin: Secondary | ICD-10-CM | POA: Diagnosis not present

## 2018-07-06 DIAGNOSIS — E119 Type 2 diabetes mellitus without complications: Secondary | ICD-10-CM | POA: Insufficient documentation

## 2018-07-06 DIAGNOSIS — N390 Urinary tract infection, site not specified: Secondary | ICD-10-CM | POA: Insufficient documentation

## 2018-07-06 DIAGNOSIS — Z853 Personal history of malignant neoplasm of breast: Secondary | ICD-10-CM | POA: Diagnosis not present

## 2018-07-06 DIAGNOSIS — Z8541 Personal history of malignant neoplasm of cervix uteri: Secondary | ICD-10-CM | POA: Diagnosis not present

## 2018-07-06 DIAGNOSIS — Z87891 Personal history of nicotine dependence: Secondary | ICD-10-CM | POA: Insufficient documentation

## 2018-07-06 LAB — COMPREHENSIVE METABOLIC PANEL
ALT: 13 U/L (ref 0–44)
ANION GAP: 9 (ref 5–15)
AST: 17 U/L (ref 15–41)
Albumin: 3.8 g/dL (ref 3.5–5.0)
Alkaline Phosphatase: 55 U/L (ref 38–126)
BUN: 10 mg/dL (ref 8–23)
CHLORIDE: 99 mmol/L (ref 98–111)
CO2: 26 mmol/L (ref 22–32)
CREATININE: 0.69 mg/dL (ref 0.44–1.00)
Calcium: 9.7 mg/dL (ref 8.9–10.3)
GFR calc non Af Amer: >60 mL/min (ref >60)
Glucose, Bld: 182 mg/dL — ABNORMAL HIGH (ref 70–99)
POTASSIUM: 4.8 mmol/L (ref 3.5–5.1)
SODIUM: 134 mmol/L — AB (ref 135–145)
Total Bilirubin: 0.3 mg/dL (ref 0.3–1.2)
Total Protein: 6.8 g/dL (ref 6.5–8.1)

## 2018-07-06 LAB — CBC
HEMATOCRIT: 38.1 % (ref 35.0–47.0)
HEMOGLOBIN: 12.8 g/dL (ref 12.0–16.0)
MCH: 29.6 pg (ref 26.0–34.0)
MCHC: 33.6 g/dL (ref 32.0–36.0)
MCV: 88.2 fL (ref 80.0–100.0)
Platelets: 266 10*3/uL (ref 150–440)
RBC: 4.32 MIL/uL (ref 3.80–5.20)
RDW: 13.2 % (ref 11.5–14.5)
WBC: 7.7 10*3/uL (ref 3.6–11.0)

## 2018-07-06 LAB — URINALYSIS, COMPLETE (UACMP) WITH MICROSCOPIC
BILIRUBIN URINE: NEGATIVE
Ketones, ur: 5 mg/dL — AB
Nitrite: NEGATIVE
PROTEIN: NEGATIVE mg/dL
SQUAMOUS EPITHELIAL / LPF: NONE SEEN (ref 0–5)
Specific Gravity, Urine: 1.007 (ref 1.005–1.030)
pH: 5 (ref 5.0–8.0)

## 2018-07-06 MED ORDER — SODIUM CHLORIDE 0.9 % IV SOLN
1.0000 g | Freq: Once | INTRAVENOUS | Status: AC
  Administered 2018-07-06: 1 g via INTRAVENOUS
  Filled 2018-07-06: qty 10

## 2018-07-06 MED ORDER — SODIUM CHLORIDE 0.9 % IV BOLUS
1000.0000 mL | Freq: Once | INTRAVENOUS | Status: AC
  Administered 2018-07-06: 1000 mL via INTRAVENOUS

## 2018-07-06 MED ORDER — OXYBUTYNIN CHLORIDE 5 MG PO TABS
5.0000 mg | ORAL_TABLET | Freq: Once | ORAL | Status: AC
  Administered 2018-07-06: 5 mg via ORAL
  Filled 2018-07-06: qty 1

## 2018-07-06 NOTE — ED Triage Notes (Addendum)
Pt reports dysuria x 1 year.  No n/v/  Pt has lower back pain.  Pt states her bladder has dropped and she is not a candidate for surgery.   Pt alert.  Pt unable to void at this time.
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
## 2018-07-06 NOTE — Telephone Encounter (Signed)
Information has been submitted to pts insurance for verification of benefits. Awaiting response for coverage  

## 2018-07-06 NOTE — ED Notes (Signed)
Called lab and they stated they didn't have a urine sample from pt. Pt had stated she provided urine specimen in triage

## 2018-07-06 NOTE — ED Notes (Signed)
Report given to Sherrie RN 

## 2018-07-06 NOTE — ED Notes (Signed)
Called and spoke with lab and informed them to please run 2nd urine sample sent asap. They stated they would.

## 2018-07-06 NOTE — ED Notes (Signed)
2nd urine sample sent to lab.

## 2018-07-07 ENCOUNTER — Encounter: Payer: Self-pay | Admitting: Family Medicine

## 2018-07-07 ENCOUNTER — Ambulatory Visit (INDEPENDENT_AMBULATORY_CARE_PROVIDER_SITE_OTHER): Payer: Medicare HMO | Admitting: Family Medicine

## 2018-07-07 VITALS — BP 120/80 | HR 100 | Temp 97.9°F | Ht 62.0 in | Wt 145.5 lb

## 2018-07-07 DIAGNOSIS — Z8541 Personal history of malignant neoplasm of cervix uteri: Secondary | ICD-10-CM | POA: Diagnosis not present

## 2018-07-07 DIAGNOSIS — N390 Urinary tract infection, site not specified: Secondary | ICD-10-CM | POA: Insufficient documentation

## 2018-07-07 DIAGNOSIS — N3000 Acute cystitis without hematuria: Secondary | ICD-10-CM

## 2018-07-07 DIAGNOSIS — Z87891 Personal history of nicotine dependence: Secondary | ICD-10-CM | POA: Diagnosis not present

## 2018-07-07 DIAGNOSIS — Z23 Encounter for immunization: Secondary | ICD-10-CM

## 2018-07-07 DIAGNOSIS — Z79899 Other long term (current) drug therapy: Secondary | ICD-10-CM | POA: Diagnosis not present

## 2018-07-07 DIAGNOSIS — Z7982 Long term (current) use of aspirin: Secondary | ICD-10-CM | POA: Diagnosis not present

## 2018-07-07 DIAGNOSIS — Z7984 Long term (current) use of oral hypoglycemic drugs: Secondary | ICD-10-CM | POA: Diagnosis not present

## 2018-07-07 DIAGNOSIS — E119 Type 2 diabetes mellitus without complications: Secondary | ICD-10-CM | POA: Diagnosis not present

## 2018-07-07 DIAGNOSIS — R102 Pelvic and perineal pain: Secondary | ICD-10-CM

## 2018-07-07 DIAGNOSIS — I10 Essential (primary) hypertension: Secondary | ICD-10-CM | POA: Diagnosis not present

## 2018-07-07 DIAGNOSIS — Z853 Personal history of malignant neoplasm of breast: Secondary | ICD-10-CM | POA: Diagnosis not present

## 2018-07-07 MED ORDER — OXYBUTYNIN CHLORIDE 5 MG PO TABS: 5.0000 mg | ORAL_TABLET | Freq: Three times a day (TID) | ORAL | 0 refills | Status: DC | PRN

## 2018-07-07 MED ORDER — CEPHALEXIN 500 MG PO CAPS: 500.0000 mg | ORAL_CAPSULE | Freq: Three times a day (TID) | ORAL | 0 refills | Status: DC

## 2018-07-07 NOTE — Patient Instructions (Addendum)
Take your keflex for uti  If symptoms worsen let me know   Flu shot today   Drink lots of water   Try the ditropan for spasms - only continue it if it helps   Let's refer you to gyn for pelvic pain also   Here is info on prolia for osteoporosis - let us know if you are interested we will start the process

## 2018-07-07 NOTE — Assessment & Plan Note (Signed)
Chronic pelvic pain -most likely due to bladder (? IC)  Also cystocele and occ uti  Pt would like eval from gyn  She has had partial hysterectomy for cervical cancer in the past  Ref done to gyn

## 2018-07-07 NOTE — Discharge Instructions (Addendum)
Take antibiotic as prescribed (Keflex 500mg  three times daily x 7 days). Take Ditropan as needed for bladder spasms. Plenty of fluids daily. Return to the ER for worsening symptoms, persistent vomiting, fever or other concerns.

## 2018-07-07 NOTE — Assessment & Plan Note (Signed)
Seen in ED last night and tx with rocephin / keflex  Taking it now -no improvement yet Also ditropan for bladder spasm to try  Reviewed hospital records, lab results and studies in detail  Due to chronic dysuria and bladder pain- difficult to know when she has uti  ucx pending Enc water intake  ? If she may have IC (has seen urology)  Also cystocele and chronic pelvic pain  Will ref to gyn for pelvic pain (partial hysterectomy in the past)  Update if not starting to improve in a week or if worsening
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
## 2018-07-08 LAB — URINE CULTURE

## 2018-07-13 ENCOUNTER — Telehealth: Payer: Self-pay | Admitting: *Deleted

## 2018-07-13 DIAGNOSIS — R350 Frequency of micturition: Secondary | ICD-10-CM

## 2018-07-13 NOTE — Telephone Encounter (Signed)
Copied from [place] [id number]. Topic: Referral - Request for Referral >> Jul 13, 2018 12:43 PM Yvette Rack wrote: Has patient seen PCP for this complaint? yes  *If NO, is insurance requiring patient see PCP for this issue before PCP can refer them? Referral for which specialty:  Preferred provider/office: [facility] Reason for referral: bladder infection

## 2018-07-13 NOTE — Telephone Encounter (Signed)
Called patient and gave her [facility] office information and to be expecting a call from their office.

## 2018-07-13 NOTE — Telephone Encounter (Signed)
I did the ref  She had recent uti-looks like her culture was inconclusive  Please let her know that

## 2018-07-14 ENCOUNTER — Encounter: Payer: Self-pay | Admitting: Obstetrics & Gynecology

## 2018-07-14 ENCOUNTER — Ambulatory Visit (INDEPENDENT_AMBULATORY_CARE_PROVIDER_SITE_OTHER): Payer: Medicare HMO | Admitting: Obstetrics & Gynecology

## 2018-07-14 VITALS — BP 150/80 | Ht 62.0 in | Wt 144.0 lb

## 2018-07-14 DIAGNOSIS — N952 Postmenopausal atrophic vaginitis: Secondary | ICD-10-CM | POA: Insufficient documentation

## 2018-07-14 DIAGNOSIS — N8111 Cystocele, midline: Secondary | ICD-10-CM | POA: Diagnosis not present

## 2018-07-14 NOTE — Progress Notes (Addendum)
CONSULT: Cystocele CONSULTING PROVIDER: Loura Pardon, MD  Cystocele/Rectocele Patient complains of a cystocele. Problem started 3 months ago but really has been developing over years. Symptoms include: prolapse of tissue with straining and discomfort: moderate. Symptoms have gradually worsened.  Prior hysterectomy 1960s and bladder repair 1995.  No incontinence.  Nocturia and urgency and frequency.  Has been treated for UTI and w antispasmodics without relief.  PMHx: She  has a past medical history of Breast cancer ([place]) (10/11/13 dx), Breast cancer ([place]), Cervical cancer ([place]), Diabetes mellitus, Full dentures, GERD (gastroesophageal reflux disease), Hypertension, Osteoarthritis, Osteopenia, Stress reaction (2009), Urinary incontinence, and Varicose veins. Also,  has a past surgical history that includes Abdominal hysterectomy (1964); Bladder surgery (1995); Abdominal US (2007); Sclerotherapy varicose veins (5/08); left breast bx (1/15); Breast lumpectomy with needle localization (Left, 11/10/2013); Breast biopsy (Left, 09/2013); Breast biopsy (Left, 12/2014); and Breast lumpectomy (Left, 10/2013)., family history includes Cancer in her brother, brother, father, and sister; Cancer (age of onset: 63) in her other; Heart attack in her mother.,  reports that she quit smoking about 22 years ago. Her smoking use included cigarettes. She has a 39.20 pack-year smoking history. She has never used smokeless tobacco. She reports that she does not drink alcohol or use drugs.  She has a current medication list which includes the following prescription(s): accu-chek softclix lancets, acetaminophen, aspirin, onetouch verio, cephalexin, vitamin d, citalopram, cyanocobalamin, cyanocobalamin, glipizide, glucose blood, lisinopril, metformin, nystatin, nystatin-triamcinolone ointment, oxybutynin, pioglitazone, ranitidine, simvastatin, and tamoxifen. Also, is allergic to actos [pioglitazone]; alendronate sodium; boniva  [ibandronate sodium]; ibandronic acid; lansoprazole; and omeprazole.  Review of Systems  Constitutional: Negative for chills, fever and malaise/fatigue.  HENT: Negative for congestion, sinus pain and sore throat.   Eyes: Negative for blurred vision and pain.  Respiratory: Negative for cough and wheezing.   Cardiovascular: Negative for chest pain and leg swelling.  Gastrointestinal: Negative for abdominal pain, constipation, diarrhea, heartburn, nausea and vomiting.  Genitourinary: Negative for dysuria, frequency, hematuria and urgency.  Musculoskeletal: Negative for back pain, joint pain, myalgias and neck pain.  Skin: Negative for itching and rash.  Neurological: Negative for dizziness, tremors and weakness.  Endo/Heme/Allergies: Does not bruise/bleed easily.  Psychiatric/Behavioral: Negative for depression. The patient is not nervous/anxious and does not have insomnia.     Objective: BP (!) 150/80   Ht 5\' 2"  (1.575 m)   Wt 144 lb (65.3 kg)   BMI 26.34 kg/m  Physical Exam  Constitutional: She is oriented to person, place, and time. She appears well-developed and well-nourished. No distress.  Genitourinary: Rectum normal and vagina normal. Pelvic exam was performed with patient supine. There is no rash or lesion on the right labia. There is no rash or lesion on the left labia. Vagina exhibits no lesion. No bleeding in the vagina. Right adnexum does not display mass and does not display tenderness. Left adnexum does not display mass and does not display tenderness.  Genitourinary Comments: Absent Uterus Absent cervix Complete prolapse of cystocele Vaginal wall atrophy and weakening Min rectocele  HENT:  Head: Normocephalic and atraumatic. Head is without laceration.  Right Ear: Hearing normal.  Left Ear: Hearing normal.  Nose: No epistaxis.  No foreign bodies.  Mouth/Throat: Uvula is midline, oropharynx is clear and moist and mucous membranes are normal.  Eyes: Pupils are equal,  round, and reactive to light.  Neck: Normal range of motion. Neck supple. No thyromegaly present.  Cardiovascular: Normal rate and regular rhythm. Exam reveals no gallop and no friction  rub.  No murmur heard. Pulmonary/Chest: Effort normal and breath sounds normal. No respiratory distress. She has no wheezes. Right breast exhibits no mass, no skin change and no tenderness. Left breast exhibits no mass, no skin change and no tenderness.  Abdominal: Soft. Bowel sounds are normal. She exhibits no distension. There is no tenderness. There is no rebound.  Musculoskeletal: Normal range of motion.  Neurological: She is alert and oriented to person, place, and time. No cranial nerve deficit.  Skin: Skin is warm and dry.  Psychiatric: She has a normal mood and affect. Judgment normal.  Vitals reviewed.   ASSESSMENT/PLAN:   CYSTOCELE, VAGINAL WEAKENING and ATROPHY Surgery and pessary options d/w pt Pessary fitting today Plan- Ring #4 placed and tolerated F/U one month  Pessary Fitting Patient presents for a pessary fitting. She desires a pessary as her means of controlling her symptoms of prolapse and/or urinary incontinence. She understands the care needed for a pessary and desires to proceed. Alternative treatment options have been discussed at length and the patient voices an understanding of each option.   PROCEDURE: The patient was placed in dorsal lithotomy position. Examination confirmed prolapse. A 4 ring w support pessary was fitted without difficulty. The patient subsequently ambulated, voided and performed valsalva maneuvers without dislodging the pessary and without discomfort. Care instructions were provided. Patient was discharged to home in stable condition.   Barnett Applebaum, MD, [facility], [facility] 07/14/2018  2:39 PM

## 2018-07-14 NOTE — Patient Instructions (Signed)

## 2018-07-17 ENCOUNTER — Encounter: Payer: Self-pay | Admitting: Obstetrics & Gynecology

## 2018-07-17 ENCOUNTER — Ambulatory Visit (INDEPENDENT_AMBULATORY_CARE_PROVIDER_SITE_OTHER): Payer: Medicare HMO | Admitting: Obstetrics & Gynecology

## 2018-07-17 VITALS — BP 140/80 | Ht 62.0 in | Wt 143.0 lb

## 2018-07-17 DIAGNOSIS — N3946 Mixed incontinence: Secondary | ICD-10-CM

## 2018-07-17 DIAGNOSIS — N8111 Cystocele, midline: Secondary | ICD-10-CM | POA: Diagnosis not present

## 2018-07-17 DIAGNOSIS — N952 Postmenopausal atrophic vaginitis: Secondary | ICD-10-CM

## 2018-07-17 NOTE — Progress Notes (Signed)
  HPI:      [name] is a 82 y.o. who presents today for her pessary follow up and examination related to her pelvic floor weakening.  Pt reports her ring #4 pessary fell out after 16 hours in place.  No pain or bleeding.  It did seem to help when in.  PMHx: She  has a past medical history of Breast cancer ([place]) (10/11/13 dx), Breast cancer ([place]), Cervical cancer ([place]), Diabetes mellitus, Full dentures, GERD (gastroesophageal reflux disease), Hypertension, Osteoarthritis, Osteopenia, Stress reaction (2009), Urinary incontinence, and Varicose veins. Also,  has a past surgical history that includes Abdominal hysterectomy (1964); Bladder surgery (1995); Abdominal US (2007); Sclerotherapy varicose veins (5/08); left breast bx (1/15); Breast lumpectomy with needle localization (Left, 11/10/2013); Breast biopsy (Left, 09/2013); Breast biopsy (Left, 12/2014); and Breast lumpectomy (Left, 10/2013)., family history includes Cancer in her brother, brother, father, and sister; Cancer (age of onset: 42) in her other; Heart attack in her mother.,  reports that she quit smoking about 22 years ago. Her smoking use included cigarettes. She has a 39.20 pack-year smoking history. She has never used smokeless tobacco. She reports that she does not drink alcohol or use drugs.  She has a current medication list which includes the following prescription(s): accu-chek softclix lancets, acetaminophen, aspirin, onetouch verio, cephalexin, vitamin d, citalopram, cyanocobalamin, cyanocobalamin, glipizide, glucose blood, lisinopril, metformin, nystatin, nystatin-triamcinolone ointment, oxybutynin, pioglitazone, ranitidine, simvastatin, and tamoxifen. Also, is allergic to actos [pioglitazone]; alendronate sodium; boniva [ibandronate sodium]; ibandronic acid; lansoprazole; and omeprazole.  Review of Systems  All other systems reviewed and are negative.   Objective: BP 140/80   Ht 5\' 2"  (1.575 m)   Wt 143 lb (64.9 kg)    BMI 26.16 kg/m  Physical Exam  Constitutional: She is oriented to person, place, and time. She appears well-developed and well-nourished. No distress.  Genitourinary: Vagina normal. Pelvic exam was performed with patient supine. There is no rash, tenderness or lesion on the right labia. There is no rash, tenderness or lesion on the left labia. No erythema or bleeding in the vagina.  Genitourinary Comments: Cuff intact/ no lesions Absent uterus and cervix Complete prolapse of cystocele Vaginal wall atrophy and weakening Min rectocele   HENT:  Head: Normocephalic and atraumatic.  Nose: Nose normal.  Mouth/Throat: Oropharynx is clear and moist.  Abdominal: Soft. She exhibits no distension. There is no tenderness.  Musculoskeletal: Normal range of motion.  Neurological: She is alert and oriented to person, place, and time. No cranial nerve deficit.  Skin: Skin is warm and dry.  Psychiatric: She has a normal mood and affect.   Pessary Care Vagina checked - without erosions - pessary replaced.  A/P: CYSTOCELE, VAGINAL WEAKENING and ATROPHY Pessary was replaced today with a ring #5 w support.    Consider [facility] as next option if expulsion recurs. Instructions given for care. Concerning symptoms to observe for are counseled to patient.  A total of 15 minutes were spent face-to-face with the patient during this encounter and over half of that time dealt with counseling and coordination of care.  Barnett Applebaum, MD, [facility], [facility] 07/17/2018  2:40 PM

## 2018-07-21 ENCOUNTER — Ambulatory Visit (INDEPENDENT_AMBULATORY_CARE_PROVIDER_SITE_OTHER): Payer: Medicare HMO | Admitting: *Deleted

## 2018-07-21 DIAGNOSIS — E538 Deficiency of other specified B group vitamins: Secondary | ICD-10-CM

## 2018-07-21 MED ORDER — CYANOCOBALAMIN 1000 MCG/ML IJ SOLN
1000.0000 ug | Freq: Once | INTRAMUSCULAR | Status: AC
  Administered 2018-07-21: 1000 ug via INTRAMUSCULAR

## 2018-07-28 ENCOUNTER — Encounter: Payer: Self-pay | Admitting: Urology

## 2018-07-28 ENCOUNTER — Ambulatory Visit: Payer: Medicare HMO | Admitting: Urology

## 2018-07-28 VITALS — BP 130/83 | HR 118 | Ht 62.0 in | Wt 141.5 lb

## 2018-07-28 DIAGNOSIS — N3000 Acute cystitis without hematuria: Secondary | ICD-10-CM | POA: Diagnosis not present

## 2018-07-28 DIAGNOSIS — R300 Dysuria: Secondary | ICD-10-CM | POA: Diagnosis not present

## 2018-07-28 LAB — MICROSCOPIC EXAMINATION: RBC MICROSCOPIC, UA: NONE SEEN /HPF (ref 0–2)

## 2018-07-28 LAB — URINALYSIS, COMPLETE
BILIRUBIN UA: NEGATIVE
KETONES UA: NEGATIVE
NITRITE UA: NEGATIVE
PH UA: 5 (ref 5.0–7.5)
Protein, UA: NEGATIVE
SPEC GRAV UA: 1.02 (ref 1.005–1.030)
UUROB: 0.2 mg/dL (ref 0.2–1.0)

## 2018-07-28 LAB — BLADDER SCAN AMB NON-IMAGING: SCAN RESULT: 106

## 2018-07-28 MED ORDER — SULFAMETHOXAZOLE-TRIMETHOPRIM 800-160 MG PO TABS: 1.0000 | ORAL_TABLET | Freq: Two times a day (BID) | ORAL | 0 refills | Status: DC

## 2018-07-28 NOTE — Progress Notes (Signed)
In and Out Catheterization  Patient is present today for a I & O catheterization due to urinary retention. Patient was cleaned and prepped in a sterile fashion with betadine and Lidocaine 2% jelly was instilled into the urethra.  A 14FR cath was inserted no complications were noted , 133ml of urine return was noted, urine was yellow and cloudy in color. A clean urine sample was collected for ua and urine culture. Bladder was drained  And catheter was removed with out difficulty.    Preformed by: KDM cma
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
## 2018-08-02 LAB — CULTURE, URINE COMPREHENSIVE

## 2018-08-03 ENCOUNTER — Other Ambulatory Visit: Payer: Self-pay | Admitting: Urology

## 2018-08-03 ENCOUNTER — Telehealth: Payer: Self-pay

## 2018-08-03 DIAGNOSIS — R69 Illness, unspecified: Secondary | ICD-10-CM | POA: Diagnosis not present

## 2018-08-03 MED ORDER — FLUCONAZOLE 100 MG PO TABS: 100.0000 mg | ORAL_TABLET | Freq: Every day | ORAL | 0 refills | Status: DC

## 2018-08-03 NOTE — Progress Notes (Signed)
RX sent to [facility]  

## 2018-08-03 NOTE — Telephone Encounter (Signed)
-----   Message from Nori Riis, PA-C sent at 08/03/2018  7:41 AM EST ----- Please let Laura Bell know that her urine culture grew out yeast.  She needs to stop the Septra and start Diflucan.  Also when she is on her Diflucan she cannot take her cholesterol medication as it can increase the risk of muscle damage.

## 2018-08-04 DIAGNOSIS — R69 Illness, unspecified: Secondary | ICD-10-CM | POA: Diagnosis not present

## 2018-08-13 ENCOUNTER — Ambulatory Visit (INDEPENDENT_AMBULATORY_CARE_PROVIDER_SITE_OTHER): Payer: Medicare HMO | Admitting: Obstetrics & Gynecology

## 2018-08-13 ENCOUNTER — Encounter: Payer: Self-pay | Admitting: Obstetrics & Gynecology

## 2018-08-13 VITALS — BP 130/80 | Ht 62.0 in | Wt 140.0 lb

## 2018-08-13 DIAGNOSIS — N8111 Cystocele, midline: Secondary | ICD-10-CM | POA: Diagnosis not present

## 2018-08-13 DIAGNOSIS — N3946 Mixed incontinence: Secondary | ICD-10-CM | POA: Diagnosis not present

## 2018-08-13 DIAGNOSIS — N952 Postmenopausal atrophic vaginitis: Secondary | ICD-10-CM | POA: Diagnosis not present

## 2018-08-13 MED ORDER — FLUCONAZOLE 100 MG PO TABS: 100.0000 mg | ORAL_TABLET | Freq: Every day | ORAL | 0 refills | Status: DC

## 2018-08-13 NOTE — Progress Notes (Signed)
HPI:      [name] is a 82 y.o. No obstetric history on file. who presents today for her pessary follow up and examination related to her pelvic floor weakening. Prior sx's- Patient had complaints of a cystocele. Problem started 4 months ago but really has been developing over years. Symptoms include: prolapse of tissue with straining and discomfort: moderate. Symptoms had gradually worsened. Prior hysterectomy 1960s and bladder repair 1995.  No incontinence. Nocturia and urgency and frequency.   Since last visit, where pessary ring #5 placed, she reports tolerating the pessary well with  no vaginal bleeding and no vaginal discharge.  Symptoms of pelvic floor weakening have greatly improved. She is voiding and defecating without difficulty.   PMHx: She  has a past medical history of Arthritis, Bleeding disorder ([place]), Breast cancer ([place]) (10/11/13 dx), Breast cancer ([place]), Cervical cancer ([place]), Diabetes mellitus, Full dentures, GERD (gastroesophageal reflux disease), High cholesterol, History of stomach ulcers, Hypertension, Osteoarthritis, Osteopenia, Stress reaction (2009), Urinary incontinence, Uterus cancer ([facility]), and Varicose veins. Also,  has a past surgical history that includes Abdominal hysterectomy (1964); Bladder surgery (1995); Abdominal US (2007); Sclerotherapy varicose veins (5/08); left breast bx (1/15); Breast lumpectomy with needle localization (Left, 11/10/2013); Breast biopsy (Left, 09/2013); Breast biopsy (Left, 12/2014); and Breast lumpectomy (Left, 10/2013)., family history includes Cancer in her brother, brother, father, and sister; Cancer (age of onset: 72) in her other; Heart attack in her mother.,  reports that she quit smoking about 22 years ago. Her smoking use included cigarettes. She has a 39.20 pack-year smoking history. She has never used smokeless tobacco. She reports that she does not drink alcohol or use drugs.  She has a current medication list which  includes the following prescription(s): accu-chek softclix lancets, acetaminophen, aspirin, onetouch verio, cephalexin, vitamin d, citalopram, cyanocobalamin, cyanocobalamin, fluconazole, glipizide, glucose blood, lisinopril, metformin, nystatin, nystatin-triamcinolone ointment, oxybutynin, pioglitazone, ranitidine, simvastatin, sulfamethoxazole-trimethoprim, and tamoxifen. Also, is allergic to actos [pioglitazone]; alendronate sodium; boniva [ibandronate sodium]; ibandronic acid; lansoprazole; and omeprazole.  Review of Systems  All other systems reviewed and are negative.  Objective: BP 130/80   Ht 5\' 2"  (1.575 m)   Wt 140 lb (63.5 kg)   BMI 25.61 kg/m  Physical Exam  Constitutional: She is oriented to person, place, and time. She appears well-developed and well-nourished. No distress.  Genitourinary: Vagina normal. Pelvic exam was performed with patient supine. There is no rash, tenderness or lesion on the right labia. There is no rash, tenderness or lesion on the left labia. No erythema or bleeding in the vagina.  Genitourinary Comments: Absent uterus and cervix Complete prolapse of cystocele Vaginal wall atrophy and weakening Min rectocele   HENT:  Head: Normocephalic and atraumatic.  Nose: Nose normal.  Mouth/Throat: Oropharynx is clear and moist.  Abdominal: Soft. She exhibits no distension. There is no tenderness.  Musculoskeletal: Normal range of motion.  Neurological: She is alert and oriented to person, place, and time. No cranial nerve deficit.  Skin: Skin is warm and dry.  Psychiatric: She has a normal mood and affect.    Pessary Care Pessary removed and cleaned.  Vagina checked - without erosions - pessary replaced.  Ring #5.  A/P: 1. Cystocele, midline 2. Mixed stress and urge urinary incontinence 3. Vaginal atrophy  Pessary was cleaned and replaced today. Instructions given for care. Concerning symptoms to observe for are counseled to patient. Follow up  scheduled for 3 months.  A total of 15 minutes were spent face-to-face with  the patient during this encounter and over half of that time dealt with counseling and coordination of care.  Barnett Applebaum, MD, [facility], [facility] 08/13/2018  8:24 AM

## 2018-08-19 ENCOUNTER — Emergency Department: Payer: Medicare HMO

## 2018-08-19 ENCOUNTER — Other Ambulatory Visit: Payer: Self-pay

## 2018-08-19 ENCOUNTER — Emergency Department
Admission: EM | Admit: 2018-08-19 | Discharge: 2018-08-19 | Disposition: A | Discharge status: Home / Self Care | Payer: Medicare HMO | Attending: Emergency Medicine | Admitting: Emergency Medicine

## 2018-08-19 ENCOUNTER — Encounter: Payer: Self-pay | Admitting: Emergency Medicine

## 2018-08-19 DIAGNOSIS — K429 Umbilical hernia without obstruction or gangrene: Secondary | ICD-10-CM | POA: Diagnosis not present

## 2018-08-19 DIAGNOSIS — Z7984 Long term (current) use of oral hypoglycemic drugs: Secondary | ICD-10-CM | POA: Insufficient documentation

## 2018-08-19 DIAGNOSIS — I10 Essential (primary) hypertension: Secondary | ICD-10-CM | POA: Diagnosis not present

## 2018-08-19 DIAGNOSIS — R109 Unspecified abdominal pain: Secondary | ICD-10-CM | POA: Diagnosis not present

## 2018-08-19 DIAGNOSIS — N3000 Acute cystitis without hematuria: Secondary | ICD-10-CM | POA: Diagnosis not present

## 2018-08-19 DIAGNOSIS — R1030 Lower abdominal pain, unspecified: Secondary | ICD-10-CM | POA: Diagnosis present

## 2018-08-19 DIAGNOSIS — Z7982 Long term (current) use of aspirin: Secondary | ICD-10-CM | POA: Insufficient documentation

## 2018-08-19 DIAGNOSIS — R110 Nausea: Secondary | ICD-10-CM | POA: Insufficient documentation

## 2018-08-19 DIAGNOSIS — Z853 Personal history of malignant neoplasm of breast: Secondary | ICD-10-CM | POA: Diagnosis not present

## 2018-08-19 DIAGNOSIS — Z79899 Other long term (current) drug therapy: Secondary | ICD-10-CM | POA: Insufficient documentation

## 2018-08-19 DIAGNOSIS — Z8541 Personal history of malignant neoplasm of cervix uteri: Secondary | ICD-10-CM | POA: Diagnosis not present

## 2018-08-19 DIAGNOSIS — E119 Type 2 diabetes mellitus without complications: Secondary | ICD-10-CM | POA: Diagnosis not present

## 2018-08-19 DIAGNOSIS — Z87891 Personal history of nicotine dependence: Secondary | ICD-10-CM | POA: Insufficient documentation

## 2018-08-19 LAB — COMPREHENSIVE METABOLIC PANEL
ALBUMIN: 4 g/dL (ref 3.5–5.0)
ALT: 14 U/L (ref 0–44)
AST: 19 U/L (ref 15–41)
Alkaline Phosphatase: 53 U/L (ref 38–126)
Anion gap: 11 (ref 5–15)
BILIRUBIN TOTAL: 0.3 mg/dL (ref 0.3–1.2)
BUN: 11 mg/dL (ref 8–23)
CHLORIDE: 101 mmol/L (ref 98–111)
CO2: 25 mmol/L (ref 22–32)
CREATININE: 0.57 mg/dL (ref 0.44–1.00)
Calcium: 9.5 mg/dL (ref 8.9–10.3)
GFR calc Af Amer: >60 mL/min (ref >60)
GLUCOSE: 170 mg/dL — AB (ref 70–99)
Potassium: 3.7 mmol/L (ref 3.5–5.1)
Sodium: 137 mmol/L (ref 135–145)
Total Protein: 7.1 g/dL (ref 6.5–8.1)

## 2018-08-19 LAB — URINALYSIS, COMPLETE (UACMP) WITH MICROSCOPIC
Bilirubin Urine: NEGATIVE
Glucose, UA: 50 mg/dL — AB
Ketones, ur: 5 mg/dL — AB
NITRITE: NEGATIVE
Protein, ur: NEGATIVE mg/dL
Specific Gravity, Urine: 1.027 (ref 1.005–1.030)
pH: 5 (ref 5.0–8.0)

## 2018-08-19 LAB — CBC
HEMATOCRIT: 39.6 % (ref 36.0–46.0)
HEMOGLOBIN: 12.8 g/dL (ref 12.0–15.0)
MCH: 29.2 pg (ref 26.0–34.0)
MCHC: 32.3 g/dL (ref 30.0–36.0)
MCV: 90.2 fL (ref 80.0–100.0)
Platelets: 271 10*3/uL (ref 150–400)
RBC: 4.39 MIL/uL (ref 3.87–5.11)
RDW: 13 % (ref 11.5–15.5)
WBC: 10.4 10*3/uL (ref 4.0–10.5)
nRBC: 0 % (ref 0.0–0.2)

## 2018-08-19 LAB — LIPASE, BLOOD: LIPASE: 41 U/L (ref 11–51)

## 2018-08-19 MED ORDER — ONDANSETRON HCL 4 MG/2ML IJ SOLN
4.0000 mg | Freq: Once | INTRAMUSCULAR | Status: AC
  Administered 2018-08-19: 4 mg via INTRAVENOUS
  Filled 2018-08-19: qty 2

## 2018-08-19 MED ORDER — IOHEXOL 300 MG/ML  SOLN
100.0000 mL | Freq: Once | INTRAMUSCULAR | Status: AC | PRN
  Administered 2018-08-19: 100 mL via INTRAVENOUS

## 2018-08-19 MED ORDER — FOSFOMYCIN TROMETHAMINE 3 G PO PACK
3.0000 g | PACK | Freq: Once | ORAL | Status: AC
  Administered 2018-08-19: 3 g via ORAL
  Filled 2018-08-19: qty 3

## 2018-08-19 MED ORDER — SODIUM CHLORIDE 0.9 % IV BOLUS
1000.0000 mL | Freq: Once | INTRAVENOUS | Status: AC
  Administered 2018-08-19: 1000 mL via INTRAVENOUS

## 2018-08-19 MED ORDER — CEPHALEXIN 500 MG PO CAPS: 500.0000 mg | ORAL_CAPSULE | Freq: Two times a day (BID) | ORAL | 0 refills | Status: AC

## 2018-08-19 NOTE — ED Triage Notes (Signed)
Patient ambulatory to triage with steady gait, without difficulty or distress noted; reports onset lower abd pain upon awakening hr PTA accomp by nausea; denies hx of same

## 2018-08-19 NOTE — ED Notes (Signed)
Patient discharged to home per MD order. Patient in stable condition, and deemed medically cleared by ED provider for discharge. Discharge instructions reviewed with patient/family using "Teach Back"; verbalized understanding of medication education and administration, and information about follow-up care. Denies further concerns. ° °
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
## 2018-08-25 ENCOUNTER — Ambulatory Visit (INDEPENDENT_AMBULATORY_CARE_PROVIDER_SITE_OTHER): Payer: Medicare HMO

## 2018-08-25 DIAGNOSIS — E538 Deficiency of other specified B group vitamins: Secondary | ICD-10-CM | POA: Diagnosis not present

## 2018-08-25 MED ORDER — CYANOCOBALAMIN 1000 MCG/ML IJ SOLN
1000.0000 ug | Freq: Once | INTRAMUSCULAR | Status: AC
  Administered 2018-08-25: 1000 ug via INTRAMUSCULAR

## 2018-08-25 NOTE — Progress Notes (Signed)
Pt given B12 injection in Right Deltoid. Tolerated well. Repeat in 1 month.

## 2018-09-03 ENCOUNTER — Ambulatory Visit: Payer: Medicare HMO | Admitting: Urology

## 2018-09-03 ENCOUNTER — Encounter: Payer: Self-pay | Admitting: Urology

## 2018-09-03 VITALS — BP 150/84 | HR 130 | Ht 62.0 in | Wt 142.4 lb

## 2018-09-03 DIAGNOSIS — N390 Urinary tract infection, site not specified: Secondary | ICD-10-CM

## 2018-09-03 DIAGNOSIS — N952 Postmenopausal atrophic vaginitis: Secondary | ICD-10-CM | POA: Diagnosis not present

## 2018-09-03 DIAGNOSIS — R300 Dysuria: Secondary | ICD-10-CM | POA: Diagnosis not present

## 2018-09-03 LAB — MICROSCOPIC EXAMINATION

## 2018-09-03 LAB — URINALYSIS, COMPLETE
Bilirubin, UA: NEGATIVE
Nitrite, UA: NEGATIVE
Protein, UA: NEGATIVE
SPEC GRAV UA: 1.015 (ref 1.005–1.030)
Urobilinogen, Ur: 0.2 mg/dL (ref 0.2–1.0)
pH, UA: 5 (ref 5.0–7.5)

## 2018-09-03 NOTE — Progress Notes (Addendum)
Error

## 2018-09-07 ENCOUNTER — Telehealth: Payer: Self-pay | Admitting: Urology

## 2018-09-07 LAB — CULTURE, URINE COMPREHENSIVE
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
## 2018-09-07 NOTE — Telephone Encounter (Signed)
Pt Laura Bell asking about her u/a and culture results.  Please give pt a call [phone].

## 2018-09-07 NOTE — Telephone Encounter (Signed)
I would like to know if the vaginal estrogen cream is safe with her history of breast cancer.

## 2018-09-07 NOTE — Telephone Encounter (Signed)
Laura Bell, I called Dr. Virgie Dad office and spoke with his RN, Elmyra Ricks who looked over patient's med list and per her she did not see any anti estrogen medications so pt is ok to use topical vaginal cream

## 2018-09-07 NOTE — Telephone Encounter (Signed)
Would you contact her oncologist, Dr. Sarajane Jews C. Magrinat, to see if it would be safe to use topical vaginal estrogen cream for her burning and rUTI's? [phone].

## 2018-09-08 NOTE — Telephone Encounter (Signed)
LMOM for Dr. Virgie Dad nurse to call and let us know if Estrogen is safe for patient to use

## 2018-09-08 NOTE — Telephone Encounter (Signed)
Patient notified that we are waiting on more information from [facility] about the yeast. We will contact her as soon as we have more information. Patient voiced understanding.

## 2018-09-09 ENCOUNTER — Telehealth: Payer: Self-pay

## 2018-09-09 ENCOUNTER — Other Ambulatory Visit: Payer: Self-pay | Admitting: Oncology

## 2018-09-09 MED ORDER — ESTRADIOL 0.1 MG/GM VA CREA: TOPICAL_CREAM | VAGINAL | 12 refills | Status: DC

## 2018-09-09 NOTE — Telephone Encounter (Signed)
Dr. Jana Hakim states it is ok for her to use vaginal Estrogen cream.

## 2018-09-09 NOTE — Telephone Encounter (Signed)
That is too bad Thanks for checking

## 2018-09-09 NOTE — Telephone Encounter (Signed)
Please let Laura Bell know that her Dr. Gershon Cull office state it is safe to use the vaginal estrogen cream.  I have sent in a prescription for the Estrace cream and she needs to apply it three nights weekly, MWF.  She needs to use a "blueberry sized" amount with her fingertip.  If it is too expensive, she can call the office and we can get her something more affordable.

## 2018-09-09 NOTE — Telephone Encounter (Signed)
Information received back, and pts deductible is >$3000. She would not be a good candidate for prolia.

## 2018-09-09 NOTE — Telephone Encounter (Signed)
Left message as return call to [place] regarding use of topical estrogen cream.

## 2018-09-11 ENCOUNTER — Telehealth: Payer: Self-pay | Admitting: Urology

## 2018-09-11 ENCOUNTER — Other Ambulatory Visit: Payer: Self-pay | Admitting: Urology

## 2018-09-11 DIAGNOSIS — Z8619 Personal history of other infectious and parasitic diseases: Secondary | ICD-10-CM

## 2018-09-11 MED ORDER — ESTROGENS, CONJUGATED 0.625 MG/GM VA CREA: TOPICAL_CREAM | VAGINAL | 12 refills | Status: DC

## 2018-09-11 NOTE — Telephone Encounter (Signed)
Patient called the office today.  The estrogen cream that was prescribed is too expensive for her to pick up ($245).  She is wanting to know if there is something that is more affordable.  She uses the [facility] on [address] in [place].  Patient can be reached at the [phone].

## 2018-09-11 NOTE — Addendum Note (Signed)
Addended by: Zara Council A on: 09/11/2018 11:17 AM   Modules accepted: Orders

## 2018-09-11 NOTE — Telephone Encounter (Signed)
Called pt's daughter back, no answer. Unable to leave VM as it is full. Will send mychart message.

## 2018-09-11 NOTE — Telephone Encounter (Signed)
Informed patient that premarin cream was sent to pharmacy.

## 2018-09-11 NOTE — Telephone Encounter (Signed)
I will send in a prescription for Premarin to see if it is more affordable.

## 2018-09-11 NOTE — Progress Notes (Signed)
Orders in for ID referral.

## 2018-09-11 NOTE — Telephone Encounter (Signed)
Pt daughter called office stating her and her mother are confused about the "cream" rx that's at the pharmacy. They thought an antibiotic was going to be called in for the pt for UTI she was told she had - please clarify prescriptions and antibiotics Rx with Arbie Cookey at [phone]. Thanks.

## 2018-10-01 ENCOUNTER — Other Ambulatory Visit: Payer: Self-pay | Admitting: Family Medicine

## 2018-10-01 ENCOUNTER — Telehealth: Payer: Self-pay | Admitting: Family Medicine

## 2018-10-01 LAB — ORGANISM IDENTIFICATION, YEAST

## 2018-10-01 LAB — SPECIMEN STATUS REPORT

## 2018-10-01 NOTE — Telephone Encounter (Signed)
Pt called office and I made her aware of her appt on 1/14 with [facility].

## 2018-10-01 NOTE — Telephone Encounter (Signed)
-----   Message from Nori Riis, PA-C sent at 10/01/2018 12:41 PM EST ----- I had sent a MyChart message to Mrs. Stencel before the holiday and placed orders for an ID referral.  Would you check to see if she received the MyChart message and if she saw ID?

## 2018-10-01 NOTE — Telephone Encounter (Signed)
There is an appointment scheduled for patient at [facility] on 10/13/2018. Tried calling patient and got no answer, no machine. I doesn't appear she has read the Mychart message.

## 2018-10-02 ENCOUNTER — Encounter: Payer: Self-pay | Admitting: Obstetrics & Gynecology

## 2018-10-02 ENCOUNTER — Ambulatory Visit: Payer: Medicare HMO | Admitting: Obstetrics & Gynecology

## 2018-10-02 VITALS — BP 140/80 | Ht 62.0 in | Wt 142.0 lb

## 2018-10-02 DIAGNOSIS — R300 Dysuria: Secondary | ICD-10-CM | POA: Diagnosis not present

## 2018-10-02 DIAGNOSIS — N3001 Acute cystitis with hematuria: Secondary | ICD-10-CM

## 2018-10-02 MED ORDER — CIPROFLOXACIN HCL 250 MG PO TABS: 250.0000 mg | ORAL_TABLET | Freq: Two times a day (BID) | ORAL | 0 refills | Status: AC

## 2018-10-02 NOTE — Patient Instructions (Signed)
Ciprofloxacin tablets What is this medicine? CIPROFLOXACIN (sip roe FLOX a sin) is a quinolone antibiotic. It is used to treat certain kinds of bacterial infections. It will not work for colds, flu, or other viral infections. This medicine may be used for other purposes; ask your health care provider or pharmacist if you have questions. COMMON BRAND NAME(S): Cipro What should I tell my health care provider before I take this medicine? They need to know if you have any of these conditions: -bone problems -diabetes -heart disease -high blood pressure -history of irregular heartbeat -history of low levels of potassium in the blood -joint problems -kidney disease -liver disease -mental illness -myasthenia gravis -seizures -tendon problems -tingling of the fingers or toes, or other nerve disorder -an unusual or allergic reaction to ciprofloxacin, other antibiotics or medicines, foods, dyes, or preservatives -pregnant or trying to get pregnant -breast-feeding How should I use this medicine? Take this medicine by mouth with a full glass of water. Follow the directions on the prescription label. You can take it with or without food. If it upsets your stomach, take it with food. Take your medicine at regular intervals. Do not take your medicine more often than directed. Take all of your medicine as directed even if you think you are better. Do not skip doses or stop your medicine early. Avoid antacids, aluminum, calcium, iron, magnesium, and zinc products for 6 hours before and 2 hours after taking a dose of this medicine. A special MedGuide will be given to you by the pharmacist with each prescription and refill. Be sure to read this information carefully each time. Talk to your pediatrician regarding the use of this medicine in children. Special care may be needed. Overdosage: If you think you have taken too much of this medicine contact a poison control center or emergency room at once. NOTE:  This medicine is only for you. Do not share this medicine with others. What if I miss a dose? If you miss a dose, take it as soon as you can. If it is almost time for your next dose, take only that dose. Do not take double or extra doses. What may interact with this medicine? Do not take this medicine with any of the following medications: -cisapride -dofetilide -dronedarone -flibanserin -lomitapide -pimozide -thioridazine -tizanidine -ziprasidone This medicine may also interact with the following medications: -antacids -birth control pills -caffeine -certain medicines for diabetes, like glipizide, glyburide, or insulin -certain medicines that treat or prevent blood clots like warfarin -clozapine -cyclosporine -didanosine buffered tablets or powder -duloxetine -lanthanum carbonate -lidocaine -methotrexate -multivitamins -NSAIDS, medicines for pain and inflammation, like ibuprofen or naproxen -olanzapine -omeprazole -other medicines that prolong the QT interval (cause an abnormal heart rhythm) -phenytoin -probenecid -ropinirole -sevelamer -sildenafil -sucralfate -theophylline -zolpidem This list may not describe all possible interactions. Give your health care provider a list of all the medicines, herbs, non-prescription drugs, or dietary supplements you use. Also tell them if you smoke, drink alcohol, or use illegal drugs. Some items may interact with your medicine. What should I watch for while using this medicine? Tell your doctor or healthcare professional if your symptoms do not start to get better or if they get worse. Do not treat diarrhea with over the counter products. Contact your doctor if you have diarrhea that lasts more than 2 days or if it is severe and watery. Check with your doctor or health care professional if you get an attack of severe diarrhea, nausea and vomiting, or if you sweat  a lot. The loss of too much body fluid can make it dangerous for you to  take this medicine. This medicine may affect blood sugar levels. If you have diabetes, check with your doctor or health care professional before you change your diet or the dose of your diabetic medicine. You may get drowsy or dizzy. Do not drive, use machinery, or do anything that needs mental alertness until you know how this medicine affects you. Do not sit or stand up quickly, especially if you are an older patient. This reduces the risk of dizzy or fainting spells. This medicine can make you more sensitive to the sun. Keep out of the sun. If you cannot avoid being in the sun, wear protective clothing and use a sunscreen. Do not use sun lamps or tanning beds/booths. What side effects may I notice from receiving this medicine? Side effects that you should report to your doctor or health care professional as soon as possible: -allergic reactions like skin rash or hives, swelling of the face, lips, or tongue -anxious -bloody or watery diarrhea -confusion -depressed mood -fast, irregular heartbeat -fever -hallucination, loss of contact with reality -joint, muscle, or tendon pain or swelling -loss of memory -pain, tingling, numbness in the hands or feet -seizures -signs and symptoms of aortic dissection such as sudden chest, stomach, or back pain -signs and symptoms of high blood sugar such as dizziness; dry mouth; dry skin; fruity breath; nausea; stomach pain; increased hunger or thirst; increased urination -signs and symptoms of liver injury like dark yellow or brown urine; general ill feeling or flu-like symptoms; light-colored stools; loss of appetite; nausea; right upper belly pain; unusually weak or tired; yellowing of the eyes or skin -signs and symptoms of low blood sugar such as feeling anxious; confusion; dizziness; increased hunger; unusually weak or tired; sweating; shakiness; cold; irritable; headache; blurred vision; fast heartbeat; loss of consciousness; pale skin -suicidal  thoughts or other mood changes -sunburn -unusually weak or tired Side effects that usually do not require medical attention (report to your doctor or health care professional if they continue or are bothersome): -dry mouth -headache -nausea -trouble sleeping This list may not describe all possible side effects. Call your doctor for medical advice about side effects. You may report side effects to FDA at 1-800-FDA-1088. Where should I keep my medicine? Keep out of the reach of children. Store at room temperature below 30 degrees C (86 degrees F). Keep container tightly closed. Throw away any unused medicine after the expiration date. NOTE: This sheet is a summary. It may not cover all possible information. If you have questions about this medicine, talk to your doctor, pharmacist, or health care provider.  2019 Elsevier/Gold Standard (2018-05-06 15:54:38)

## 2018-10-02 NOTE — Progress Notes (Signed)
HPI:      Ms. DELAILAH SPIETH is a 83 y.o. No obstetric history on file. who LMP was No LMP recorded. Patient has had a hysterectomy., presents today for a problem visit.    Urinary Tract Infection: Patient complains of burning with urination . She has had symptoms for several days. Patient also complains of using the estrogen vaginal\ cream 3 times weekly, but that it is expensive. Patient denies fever and vaginal discharge. Patient does have a history of recurrent UTI.  Patient does not have a history of pyelonephritis.   PMHx: She  has a past medical history of Arthritis, Bleeding disorder ([place]), Breast cancer ([place]) (10/11/13 dx), Breast cancer ([place]), Cervical cancer ([place]), Diabetes mellitus, Full dentures, GERD (gastroesophageal reflux disease), High cholesterol, History of stomach ulcers, Hypertension, Osteoarthritis, Osteopenia, Stress reaction (2009), Urinary incontinence, Uterus cancer ([place]), and Varicose veins. Also,  has a past surgical history that includes Abdominal hysterectomy (1964); Bladder surgery (1995); Abdominal US (2007); Sclerotherapy varicose veins (5/08); left breast bx (1/15); Breast lumpectomy with needle localization (Left, 11/10/2013); Breast biopsy (Left, 09/2013); Breast biopsy (Left, 12/2014); and Breast lumpectomy (Left, 10/2013)., family history includes Cancer in her brother, brother, father, and sister; Cancer (age of onset: 23) in an other family member; Heart attack in her mother.,  reports that she quit smoking about 22 years ago. Her smoking use included cigarettes. She has a 39.20 pack-year smoking history. She has never used smokeless tobacco. She reports that she does not drink alcohol or use drugs.  She has a current medication list which includes the following prescription(s): accu-chek softclix lancets, acetaminophen, aspirin, onetouch verio, vitamin d, citalopram, conjugated estrogens, cyanocobalamin, cyanocobalamin, estradiol, fluconazole, glucose blood,  lisinopril, metformin, nystatin, nystatin-triamcinolone ointment, oxybutynin, pioglitazone, ranitidine, simvastatin, tamoxifen, and glipizide. Also, is allergic to actos [pioglitazone]; alendronate sodium; boniva [ibandronate sodium]; ibandronic acid; lansoprazole; and omeprazole.  Review of Systems  Constitutional: Negative for chills, fever and malaise/fatigue.  HENT: Negative for congestion, sinus pain and sore throat.   Eyes: Negative for blurred vision and pain.  Respiratory: Negative for cough and wheezing.   Cardiovascular: Negative for chest pain and leg swelling.  Gastrointestinal: Negative for abdominal pain, constipation, diarrhea, heartburn, nausea and vomiting.  Genitourinary: Negative for dysuria, frequency, hematuria and urgency.  Musculoskeletal: Negative for back pain, joint pain, myalgias and neck pain.  Skin: Negative for itching and rash.  Neurological: Negative for dizziness, tremors and weakness.  Endo/Heme/Allergies: Does not bruise/bleed easily.  Psychiatric/Behavioral: Negative for depression. The patient is not nervous/anxious and does not have insomnia.     Objective: BP 140/80   Ht 5\' 2"  (1.575 m)   Wt 142 lb (64.4 kg)   BMI 25.97 kg/m  Physical Exam Constitutional:      General: She is not in acute distress.    Appearance: She is well-developed.  Genitourinary:     Pelvic exam was performed with patient supine.     Vagina normal.     No vaginal erythema or bleeding.     Genitourinary Comments: Cuff intact/ no lesions Pessary in proper orientation without erosion or bleeding Normal mucosa, minimal atrophy Absent uterus and cervix  HENT:     Head: Normocephalic and atraumatic.     Nose: Nose normal.  Abdominal:     General: There is no distension.     Palpations: Abdomen is soft.     Tenderness: There is no abdominal tenderness.  Musculoskeletal: Normal range of motion.  Neurological:     Mental Status:  She is alert and oriented to person, place,  and time.     Cranial Nerves: No cranial nerve deficit.  Skin:    General: Skin is warm and dry.   UA- blood, leuk, glc  ASSESSMENT/PLAN:   Acute cystitis  Cipro Follow up with Urology, Infectious Disease Cont Pessary use Cont vag estrogen if affordable  Barnett Applebaum, MD, [facility], [facility] 10/02/2018  10:08 AM

## 2018-10-04 LAB — URINE CULTURE

## 2018-10-06 ENCOUNTER — Ambulatory Visit (INDEPENDENT_AMBULATORY_CARE_PROVIDER_SITE_OTHER): Payer: Medicare HMO | Admitting: *Deleted

## 2018-10-06 DIAGNOSIS — E538 Deficiency of other specified B group vitamins: Secondary | ICD-10-CM

## 2018-10-06 MED ORDER — CYANOCOBALAMIN 1000 MCG/ML IJ SOLN
1000.0000 ug | Freq: Once | INTRAMUSCULAR | Status: AC
  Administered 2018-10-06: 1000 ug via INTRAMUSCULAR

## 2018-10-06 NOTE — Progress Notes (Addendum)
Per orders of Dr. Glori Bickers, injection of b12 given by Modena Nunnery. Patient tolerated injection well.   I reviewed health advisor's note, was available for consultation, and agree with documentation and plan. Loura Pardon MD

## 2018-10-08 ENCOUNTER — Ambulatory Visit: Payer: Medicare HMO | Admitting: Infectious Diseases

## 2018-10-08 DIAGNOSIS — R69 Illness, unspecified: Secondary | ICD-10-CM | POA: Diagnosis not present

## 2018-10-09 ENCOUNTER — Telehealth: Payer: Self-pay | Admitting: *Deleted

## 2018-10-09 MED ORDER — METFORMIN HCL 1000 MG PO TABS: 1000.0000 mg | ORAL_TABLET | Freq: Two times a day (BID) | ORAL | 0 refills | Status: DC

## 2018-10-09 NOTE — Telephone Encounter (Signed)
I refilled it once   This is in the last progress note from Dr Gabriel Carina:  Assessment: Diabetes mellitus type 2, uncontrolled  Plan: -Educated her on diabetes, diabetes complications, and treatment goals to reduce risk of complications. She is adamant she does not want to add more medications for diabetes. We discussed her diet at length. She was counseled on dietary changes that may improve her glycemic control. She has declined to visit with a nutritionist. There is nothing more I can offer her as she indicates she will not change her mind about adding more medications for diabetes no matter how high her Hb A1c is in future. She was advised that I am available for follow up if needed, but if there is nothing I can do to improve her diabetes control then there is no need for routine follow up with me. She was agreeable to follow up with primary care physician for her diabetes management.   It sounds like the patient decided not to see her since she declines any additional treatment for out of control diabetes   If this is the case she needs to decide what to do  I certainly will not be able to get her in better control w/o more medication either

## 2018-10-09 NOTE — Telephone Encounter (Signed)
Patient called stating that she is completely out of her Metformin. Patient stated that [facility] sent the request to Dr. Gabriel Carina and they have not heard anything back from her about the refill. Patient stated that she does not know if she is to follow-up with her Dr. Gabriel Carina or not,  but wants Dr. Glori Bickers to refill her medication since she is completely out. [facility]

## 2018-10-12 NOTE — Telephone Encounter (Signed)
Called pt and she said that what Dr. Glori Bickers received is correct. Pt said she only want to f/u with Dr. Glori Bickers since that's what Dr. Gabriel Carina advised. Pt said she only takes metformin and glipizide and that's all she wants to take. Pt said she has a few glipizide left but she will need a refill of that med soon as well and wants to make sure Dr. Glori Bickers will fill med since she doesn't see Dr. Gabriel Carina anymore. Pt did admit that her DM "isn't in the best control" but she doesn't want to pursue it any further with anymore meds she just wants to stick with what she has been taking and that metformin and glipizide

## 2018-10-12 NOTE — Telephone Encounter (Signed)
Please refill both medicines for 3 mo and schedule f/u with me  Thanks

## 2018-10-13 ENCOUNTER — Ambulatory Visit: Payer: Medicare HMO | Admitting: Infectious Diseases

## 2018-10-13 NOTE — Telephone Encounter (Signed)
Left VM requesting pt to call the office back 

## 2018-10-14 NOTE — Telephone Encounter (Signed)
Left VM requesting pt to call the office back 

## 2018-10-14 NOTE — Telephone Encounter (Signed)
Pt returned call to [place]. °

## 2018-10-15 NOTE — Telephone Encounter (Signed)
Left VM requesting pt to call the office back 

## 2018-10-15 NOTE — Telephone Encounter (Signed)
Pt returning your call
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
## 2018-10-20 ENCOUNTER — Ambulatory Visit: Payer: Medicare HMO | Admitting: Urology

## 2018-10-21 ENCOUNTER — Encounter: Payer: Self-pay | Admitting: Urology

## 2018-10-22 ENCOUNTER — Telehealth: Payer: Self-pay | Admitting: Urology

## 2018-10-22 MED ORDER — GLIPIZIDE ER 10 MG PO TB24: 10.0000 mg | ORAL_TABLET | Freq: Two times a day (BID) | ORAL | 0 refills | Status: DC

## 2018-10-22 NOTE — Telephone Encounter (Signed)
Would you call Mrs. Rosana Berger and have her reschedule her missed appointment with me on 10/20/2018?

## 2018-10-22 NOTE — Telephone Encounter (Signed)
F/u appt scheduled

## 2018-10-23 NOTE — Telephone Encounter (Signed)
Tried calling patient and her phone has been disconnected.

## 2018-10-26 ENCOUNTER — Encounter: Payer: Self-pay | Admitting: Obstetrics & Gynecology

## 2018-10-26 ENCOUNTER — Ambulatory Visit (INDEPENDENT_AMBULATORY_CARE_PROVIDER_SITE_OTHER): Payer: Medicare HMO | Admitting: Obstetrics & Gynecology

## 2018-10-26 VITALS — BP 140/90 | Ht 62.0 in | Wt 141.0 lb

## 2018-10-26 DIAGNOSIS — B373 Candidiasis of vulva and vagina: Secondary | ICD-10-CM | POA: Diagnosis not present

## 2018-10-26 DIAGNOSIS — R300 Dysuria: Secondary | ICD-10-CM

## 2018-10-26 DIAGNOSIS — B3731 Acute candidiasis of vulva and vagina: Secondary | ICD-10-CM

## 2018-10-26 LAB — POCT URINALYSIS DIPSTICK OB
Bilirubin, UA: NEGATIVE
Blood, UA: NEGATIVE
Ketones, UA: NEGATIVE
Nitrite, UA: NEGATIVE
POC,PROTEIN,UA: NEGATIVE
Spec Grav, UA: 1.01 (ref 1.010–1.025)
Urobilinogen, UA: 0.2 E.U./dL
pH, UA: 7 (ref 5.0–8.0)

## 2018-10-26 MED ORDER — FLUCONAZOLE 150 MG PO TABS: 150.0000 mg | ORAL_TABLET | Freq: Once | ORAL | 0 refills | Status: AC

## 2018-10-26 MED ORDER — TERCONAZOLE 0.4 % VA CREA: 1.0000 | TOPICAL_CREAM | Freq: Every day | VAGINAL | 0 refills | Status: DC

## 2018-10-26 NOTE — Patient Instructions (Signed)
Terconazole vaginal cream Internal and external use for symptoms of yeast infection  What is this medicine? TERCONAZOLE (ter KON a zole) is an antifungal medicine. It is used to treat yeast infections of the vagina. This medicine may be used for other purposes; ask your health care provider or pharmacist if you have questions. COMMON BRAND NAME(S): Terazol 3, Terazol 7, Zazole What should I tell my health care provider before I take this medicine? They need to know if you have any of these conditions: -an unusual or allergic reaction to terconazole, other antifungals, other medicines, foods, dyes or preservatives -pregnant or trying to get pregnant -breast-feeding How should I use this medicine? This medicine is only for use in the vagina. Do not take by mouth. Wash hands before and after use. Read package directions carefully before using. Use this medicine at bedtime, unless otherwise directed by your doctor or health care professional. Screw the applicator onto the end of the tube and squeeze the tube to fill the applicator. Remove the applicator from the tube. Lie on your back. Gently insert the applicator tip high in the vagina and push the plunger to release the cream into the vagina. Gently remove the applicator. Wash the applicator well with warm water and soap. Use at regular intervals. Do not get this medicine in your eyes. If you do, rinse out with plenty of cool tap water. Finish the full course prescribed by your doctor or health care professional even if you think your condition is better. Do not stop using this medicine if your menstrual period starts during the time of treatment. Talk to your pediatrician regarding the use of this medicine in children. Special care may be needed. Overdosage: If you think you have taken too much of this medicine contact a poison control center or emergency room at once. NOTE: This medicine is only for you. Do not share this medicine with others. What  if I miss a dose? If you miss a dose, use it as soon as you can. If it is almost time for your next dose, use only that dose. Do not use double or extra doses. What may interact with this medicine? Interactions are not expected. Do not use any other vaginal products without telling your doctor or health care professional. This list may not describe all possible interactions. Give your health care provider a list of all the medicines, herbs, non-prescription drugs, or dietary supplements you use. Also tell them if you smoke, drink alcohol, or use illegal drugs. Some items may interact with your medicine. What should I watch for while using this medicine? Tell your doctor or health care professional if your symptoms do not start to get better within a few days. It is better not to have sex until you have finished your treatment. If you have sex, your partner should use a condom during sex to help prevent transfer of the infection. Your sexual partner may also need treatment. Vaginal medicines usually will come out of the vagina during treatment. To keep the medicine from getting on your clothing, wear a mini-pad or sanitary napkin. The use of tampons is not recommended since they may soak up the medicine. To help clear up the infection, wear freshly washed cotton, not synthetic, underwear. What side effects may I notice from receiving this medicine? Side effects that you should report to your doctor or health care professional as soon as possible: -painful or difficult urination -vaginal pain Side effects that usually do not require medical  attention (report to your doctor or health care professional if they continue or are bothersome): -headache -menstrual pain -stomach upset -vaginal irritation, itching or burning This list may not describe all possible side effects. Call your doctor for medical advice about side effects. You may report side effects to FDA at 1-800-FDA-1088. Where should I keep my  medicine? Keep out of the reach of children. Store at room temperature between 15 and 30 degrees C (59 and 86 degrees F). Throw away any unused medicine after the expiration date. NOTE: This sheet is a summary. It may not cover all possible information. If you have questions about this medicine, talk to your doctor, pharmacist, or health care provider.  2019 Elsevier/Gold Standard (2008-06-01 13:51:27)

## 2018-10-26 NOTE — Progress Notes (Signed)
HPI:      [name] is a 83 y.o. , presents today for a problem visit.  She complains of:  Vaginitis: Patient complains of an abnormal vaginal irritation and itching for 1 day. Vaginal symptoms include burning and local irritation.Vulvar symptoms include vulvar itching.STI Risk: Very low risk of STD exposureDischarge described as: urine dysuria; she has had UTI in past multiple times.  Last UA pos, culture neg on 10/02/2018.Marland KitchenOther associated symptoms: none.  PMHx: She  has a past medical history of Arthritis, Bleeding disorder ([place]), Breast cancer ([place]) (10/11/13 dx), Breast cancer ([place]), Cervical cancer ([place]), Diabetes mellitus, Full dentures, GERD (gastroesophageal reflux disease), High cholesterol, History of stomach ulcers, Hypertension, Osteoarthritis, Osteopenia, Stress reaction (2009), Urinary incontinence, Uterus cancer ([place]), and Varicose veins. Also,  has a past surgical history that includes Abdominal hysterectomy (1964); Bladder surgery (1995); Abdominal US (2007); Sclerotherapy varicose veins (5/08); left breast bx (1/15); Breast lumpectomy with needle localization (Left, 11/10/2013); Breast biopsy (Left, 09/2013); Breast biopsy (Left, 12/2014); and Breast lumpectomy (Left, 10/2013)., family history includes Cancer in her brother, brother, father, and sister; Cancer (age of onset: 18) in an other family member; Heart attack in her mother.,  reports that she quit smoking about 22 years ago. Her smoking use included cigarettes. She has a 39.20 pack-year smoking history. She has never used smokeless tobacco. She reports that she does not drink alcohol or use drugs.  She has a current medication list which includes the following prescription(s): accu-chek softclix lancets, acetaminophen, aspirin, onetouch verio, vitamin d, citalopram, conjugated estrogens, cyanocobalamin, cyanocobalamin, estradiol, glipizide, glucose blood, lisinopril, metformin, nystatin, nystatin-triamcinolone ointment,  oxybutynin, pioglitazone, ranitidine, simvastatin, tamoxifen, fluconazole, and terconazole. Also, is allergic to actos [pioglitazone]; alendronate sodium; boniva [ibandronate sodium]; ibandronic acid; lansoprazole; and omeprazole.  Review of Systems  All other systems reviewed and are negative.   Objective: BP 140/90   Ht 5\' 2"  (1.575 m)   Wt 141 lb (64 kg)   BMI 25.79 kg/m  Physical Exam Constitutional:      General: She is not in acute distress.    Appearance: She is well-developed.  Genitourinary:     Pelvic exam was performed with patient supine.     Vagina normal.     Vulval rash present.     No vaginal erythema or bleeding.     Genitourinary Comments: External erythema and irritation, tender Cuff intact/ no lesions Pessary in place Absent uterus and cervix  HENT:     Head: Normocephalic and atraumatic.     Nose: Nose normal.  Abdominal:     General: There is no distension.     Palpations: Abdomen is soft.     Tenderness: There is no abdominal tenderness.  Musculoskeletal: Normal range of motion.  Neurological:     Mental Status: She is alert and oriented to person, place, and time.     Cranial Nerves: No cranial nerve deficit.  Skin:    General: Skin is warm and dry.   Microscopic wet-mount exam shows hyphae, monilia.  ASSESSMENT/PLAN:    Problem List Items Addressed This Visit      Genitourinary   Vaginal yeast infection   Relevant Medications   fluconazole (DIFLUCAN) 150 MG tablet Also, Terazol for int and ext use    Other Visit Diagnoses    Dysuria    -  Primary   Relevant Orders   POC Urinalysis Dipstick OB (Completed) Culture sent    Barnett Applebaum, MD, [facility], [facility] 10/26/2018  11:19 AM

## 2018-10-27 ENCOUNTER — Other Ambulatory Visit: Payer: Self-pay | Admitting: Family Medicine

## 2018-10-28 LAB — URINE CULTURE

## 2018-10-29 ENCOUNTER — Other Ambulatory Visit: Payer: Self-pay | Admitting: Obstetrics & Gynecology

## 2018-10-29 MED ORDER — CEPHALEXIN 500 MG PO CAPS: 500.0000 mg | ORAL_CAPSULE | Freq: Four times a day (QID) | ORAL | 2 refills | Status: DC

## 2018-10-29 NOTE — Progress Notes (Signed)
Pt aware.

## 2018-10-29 NOTE — Progress Notes (Signed)
Let her know CULTURE POS for UTI again.  Plan Keflex, eRx done.  Also keep other appointments as scheduled

## 2018-11-03 ENCOUNTER — Encounter: Payer: Self-pay | Admitting: Family Medicine

## 2018-11-03 ENCOUNTER — Ambulatory Visit (INDEPENDENT_AMBULATORY_CARE_PROVIDER_SITE_OTHER): Payer: Medicare HMO | Admitting: Family Medicine

## 2018-11-03 VITALS — BP 146/84 | HR 116 | Temp 97.8°F | Ht 62.0 in | Wt 142.2 lb

## 2018-11-03 DIAGNOSIS — E1165 Type 2 diabetes mellitus with hyperglycemia: Secondary | ICD-10-CM

## 2018-11-03 DIAGNOSIS — E538 Deficiency of other specified B group vitamins: Secondary | ICD-10-CM | POA: Diagnosis not present

## 2018-11-03 DIAGNOSIS — S8991XA Unspecified injury of right lower leg, initial encounter: Secondary | ICD-10-CM | POA: Insufficient documentation

## 2018-11-03 DIAGNOSIS — Z170 Estrogen receptor positive status [ER+]: Secondary | ICD-10-CM

## 2018-11-03 DIAGNOSIS — I10 Essential (primary) hypertension: Secondary | ICD-10-CM

## 2018-11-03 DIAGNOSIS — R69 Illness, unspecified: Secondary | ICD-10-CM | POA: Diagnosis not present

## 2018-11-03 DIAGNOSIS — C50512 Malignant neoplasm of lower-outer quadrant of left female breast: Secondary | ICD-10-CM | POA: Diagnosis not present

## 2018-11-03 MED ORDER — CYANOCOBALAMIN 1000 MCG/ML IJ SOLN
1000.0000 ug | Freq: Once | INTRAMUSCULAR | Status: AC
  Administered 2018-11-03: 1000 ug via INTRAMUSCULAR

## 2018-11-03 NOTE — Patient Instructions (Addendum)
Don't forget to schedule your eye exam   B12 shot today   Labs for diabetes and cholesterol today   Use ice on your leg where it hurts Keep walking  If your leg or hip start to hurt worse please let us know  Do what you can to prevent falls  Use a cane or walker if needed
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
## 2018-11-03 NOTE — Assessment & Plan Note (Signed)
After slip and fall in closet Disc fall prevention  Reassuring exam / symptoms are improving Will use cold compress prn Update if not starting to improve in a week or if worsening  (would consider hip film)

## 2018-11-03 NOTE — Assessment & Plan Note (Signed)
Injection today  Doing well

## 2018-11-03 NOTE — Assessment & Plan Note (Signed)
Continues regular oncology f/u as well as tamoxifen

## 2018-11-03 NOTE — Assessment & Plan Note (Signed)
Pt declines further medication adjustments of any kind regardless of A1C  Taking actos and glipizide and metformin  Refuses to return to endocrinology (Dr Gabriel Carina) Also declines ref to new practice She agrees to schedule her eye exam  Disc diet/exercise  Good foot care  A1C today  On ace and statin

## 2018-11-03 NOTE — Assessment & Plan Note (Signed)
Anxious today  Will re check at next visit  BP: (!) 146/84

## 2018-11-04 LAB — LIPID PANEL
Cholesterol: 179 mg/dL (ref 0–200)
HDL: 53.5 mg/dL (ref >39.00)
LDL Cholesterol: 87 mg/dL (ref 0–99)
NonHDL: 125.07
TRIGLYCERIDES: 192 mg/dL — AB (ref 0.0–149.0)
Total CHOL/HDL Ratio: 3
VLDL: 38.4 mg/dL (ref 0.0–40.0)

## 2018-11-04 LAB — COMPREHENSIVE METABOLIC PANEL
ALT: 11 U/L (ref 0–35)
AST: 12 U/L (ref 0–37)
Albumin: 4 g/dL (ref 3.5–5.2)
Alkaline Phosphatase: 63 U/L (ref 39–117)
BUN: 8 mg/dL (ref 6–23)
CALCIUM: 9.8 mg/dL (ref 8.4–10.5)
CO2: 24 mEq/L (ref 19–32)
Chloride: 98 mEq/L (ref 96–112)
Creatinine, Ser: 0.65 mg/dL (ref 0.40–1.20)
GFR: 87.58 mL/min (ref >60.00)
Glucose, Bld: 193 mg/dL — ABNORMAL HIGH (ref 70–99)
Potassium: 4.5 mEq/L (ref 3.5–5.1)
Sodium: 133 mEq/L — ABNORMAL LOW (ref 135–145)
Total Bilirubin: 0.2 mg/dL (ref 0.2–1.2)
Total Protein: 6.5 g/dL (ref 6.0–8.3)

## 2018-11-04 LAB — HEMOGLOBIN A1C: Hgb A1c MFr Bld: 8.7 % — ABNORMAL HIGH (ref 4.6–6.5)

## 2018-11-05 ENCOUNTER — Ambulatory Visit: Payer: Medicare HMO | Attending: Infectious Diseases | Admitting: Infectious Diseases

## 2018-11-05 ENCOUNTER — Telehealth: Payer: Self-pay | Admitting: *Deleted

## 2018-11-05 ENCOUNTER — Encounter: Payer: Self-pay | Admitting: Infectious Diseases

## 2018-11-05 VITALS — BP 168/79 | HR 104 | Ht 62.0 in | Wt 142.0 lb

## 2018-11-05 DIAGNOSIS — N951 Menopausal and female climacteric states: Secondary | ICD-10-CM

## 2018-11-05 DIAGNOSIS — N8110 Cystocele, unspecified: Secondary | ICD-10-CM | POA: Diagnosis not present

## 2018-11-05 DIAGNOSIS — R300 Dysuria: Secondary | ICD-10-CM | POA: Diagnosis not present

## 2018-11-05 DIAGNOSIS — R3915 Urgency of urination: Secondary | ICD-10-CM

## 2018-11-05 DIAGNOSIS — Z170 Estrogen receptor positive status [ER+]: Secondary | ICD-10-CM | POA: Diagnosis not present

## 2018-11-05 DIAGNOSIS — Z90710 Acquired absence of both cervix and uterus: Secondary | ICD-10-CM | POA: Diagnosis not present

## 2018-11-05 DIAGNOSIS — E119 Type 2 diabetes mellitus without complications: Secondary | ICD-10-CM

## 2018-11-05 DIAGNOSIS — F419 Anxiety disorder, unspecified: Secondary | ICD-10-CM

## 2018-11-05 DIAGNOSIS — R351 Nocturia: Secondary | ICD-10-CM | POA: Diagnosis not present

## 2018-11-05 DIAGNOSIS — Z9889 Other specified postprocedural states: Secondary | ICD-10-CM

## 2018-11-05 DIAGNOSIS — C50912 Malignant neoplasm of unspecified site of left female breast: Secondary | ICD-10-CM | POA: Diagnosis not present

## 2018-11-05 DIAGNOSIS — B373 Candidiasis of vulva and vagina: Secondary | ICD-10-CM

## 2018-11-05 DIAGNOSIS — R69 Illness, unspecified: Secondary | ICD-10-CM | POA: Diagnosis not present

## 2018-11-05 DIAGNOSIS — Z888 Allergy status to other drugs, medicaments and biological substances status: Secondary | ICD-10-CM

## 2018-11-05 DIAGNOSIS — B3731 Acute candidiasis of vulva and vagina: Secondary | ICD-10-CM

## 2018-11-05 DIAGNOSIS — Z79899 Other long term (current) drug therapy: Secondary | ICD-10-CM

## 2018-11-05 DIAGNOSIS — F329 Major depressive disorder, single episode, unspecified: Secondary | ICD-10-CM

## 2018-11-05 DIAGNOSIS — Z87891 Personal history of nicotine dependence: Secondary | ICD-10-CM

## 2018-11-05 NOTE — Patient Instructions (Addendum)
You are referred to me for UTI and yeast You have cystocele and you have a pessary You have burning during micturition and also night frequency  You have been on antibiotics frequently  with no relief. Currently you are on one and still have symptoms Your symptom of burning is due to combination of Genitourinary menopausal syndrome and vulval yeast secondary to the antibiotics Recommend Stop all antibiotics Unnecessary antibioitcs cause side effects and can lead to resisatnt bacteria  Do not test urine for burning- Test urine unless fever, or flank pain or systemic symptoms along with  Urinary symptoms Take a probiotic every day- peals vaginal health Use estrace/premarin cream topically  Take knudsen cranberry juice -1 ounce mixed with 8 ounce of water- twice a day Take yogurt Wash with cetaphil Hydrate well
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
## 2018-11-05 NOTE — Telephone Encounter (Signed)
Called pt regarding lab results but no answer and no VM

## 2018-11-06 NOTE — Telephone Encounter (Signed)
Pt returned call to [place]. °

## 2018-11-06 NOTE — Telephone Encounter (Signed)
Called pt and no answer and no VM

## 2018-11-09 NOTE — Telephone Encounter (Signed)
Addressed in result notes  

## 2018-11-11 ENCOUNTER — Ambulatory Visit: Payer: Medicare HMO

## 2018-11-13 ENCOUNTER — Ambulatory Visit: Payer: Medicare HMO | Admitting: Obstetrics & Gynecology

## 2018-11-16 ENCOUNTER — Ambulatory Visit (INDEPENDENT_AMBULATORY_CARE_PROVIDER_SITE_OTHER): Payer: Medicare HMO | Admitting: Obstetrics & Gynecology

## 2018-11-16 ENCOUNTER — Encounter: Payer: Self-pay | Admitting: Obstetrics & Gynecology

## 2018-11-16 VITALS — BP 140/80 | Ht 62.0 in | Wt 144.0 lb

## 2018-11-16 DIAGNOSIS — N952 Postmenopausal atrophic vaginitis: Secondary | ICD-10-CM

## 2018-11-16 DIAGNOSIS — N8111 Cystocele, midline: Secondary | ICD-10-CM | POA: Diagnosis not present

## 2018-11-16 DIAGNOSIS — N3946 Mixed incontinence: Secondary | ICD-10-CM

## 2018-11-16 NOTE — Progress Notes (Signed)
HPI:      [name] is a 83 y.o.  who presents today for her pessary follow up and examination related to her pelvic floor weakening.  Pt reports tolerating the pessary well with  no vaginal bleeding and  no vaginal discharge.  Symptoms of pelvic floor weakening have greatly improved. She is voiding and defecating without difficulty. She currently has a #5 Ring pessary.  PMHx: She  has a past medical history of Arthritis, Bleeding disorder ([place]), Breast cancer ([place]) (10/11/13 dx), Breast cancer ([place]), Cervical cancer ([place]), Diabetes mellitus, Full dentures, GERD (gastroesophageal reflux disease), High cholesterol, History of stomach ulcers, Hypertension, Osteoarthritis, Osteopenia, Stress reaction (2009), Urinary incontinence, Uterus cancer ([place]), and Varicose veins. Also,  has a past surgical history that includes Abdominal hysterectomy (1964); Bladder surgery (1995); Abdominal US (2007); Sclerotherapy varicose veins (5/08); left breast bx (1/15); Breast lumpectomy with needle localization (Left, 11/10/2013); Breast biopsy (Left, 09/2013); Breast biopsy (Left, 12/2014); and Breast lumpectomy (Left, 10/2013)., family history includes Cancer in her brother, brother, father, and sister; Cancer (age of onset: 75) in an other family member; Heart attack in her mother.,  reports that she quit smoking about 23 years ago. Her smoking use included cigarettes. She has a 39.20 pack-year smoking history. She has never used smokeless tobacco. She reports that she does not drink alcohol or use drugs.  She has a current medication list which includes the following prescription(s): accu-chek softclix lancets, acetaminophen, aspirin, onetouch verio, cephalexin, vitamin d, citalopram, conjugated estrogens, cyanocobalamin, cyanocobalamin, estradiol, glipizide, glucose blood, lisinopril, metformin, nystatin, nystatin-triamcinolone ointment, oxybutynin, pioglitazone, ranitidine, simvastatin, tamoxifen, and  terconazole. Also, is allergic to actos [pioglitazone]; alendronate sodium; boniva [ibandronate sodium]; ibandronic acid; lansoprazole; and omeprazole.  Review of Systems  All other systems reviewed and are negative.   Objective: BP 140/80   Ht 5\' 2"  (1.575 m)   Wt 144 lb (65.3 kg)   BMI 26.34 kg/m  Physical Exam Constitutional:      General: She is not in acute distress.    Appearance: She is well-developed.  Genitourinary:     Pelvic exam was performed with patient supine.     Vagina normal.     No vaginal erythema or bleeding.     Genitourinary Comments: Cuff intact/ no lesions Complete prolapse of cystocele Vaginal wall atrophy and weakening Min rectocele   Absent uterus and cervix  HENT:     Head: Normocephalic and atraumatic.     Nose: Nose normal.  Abdominal:     General: There is no distension.     Palpations: Abdomen is soft.     Tenderness: There is no abdominal tenderness.  Musculoskeletal: Normal range of motion.  Neurological:     Mental Status: She is alert and oriented to person, place, and time.     Cranial Nerves: No cranial nerve deficit.  Skin:    General: Skin is warm and dry.   Pessary Care Pessary removed and cleaned.  Vagina checked - without erosions - pessary replaced.  A/P: 1. Cystocele, midline 2. Mixed stress and urge urinary incontinence 3. Vaginal atrophy Pessary was cleaned and replaced today. Cont premarin vaginal therapy M-W-F Instructions given for care. Concerning symptoms to observe for are counseled to patient. Follow up scheduled for 3 months.  A total of 15 minutes were spent face-to-face with the patient during this encounter and over half of that time dealt with counseling and coordination of care.  Barnett Applebaum, MD, [facility], [facility] 11/16/2018  11:56 AM

## 2018-11-29 DIAGNOSIS — R69 Illness, unspecified: Secondary | ICD-10-CM | POA: Diagnosis not present

## 2018-12-14 ENCOUNTER — Other Ambulatory Visit: Payer: Self-pay | Admitting: Family Medicine

## 2018-12-15 ENCOUNTER — Ambulatory Visit: Payer: Medicare HMO | Admitting: Family Medicine

## 2018-12-16 ENCOUNTER — Ambulatory Visit: Payer: Medicare HMO | Admitting: Family Medicine

## 2018-12-16 NOTE — Telephone Encounter (Signed)
mchart

## 2018-12-17 ENCOUNTER — Other Ambulatory Visit: Payer: Self-pay

## 2018-12-17 ENCOUNTER — Ambulatory Visit (INDEPENDENT_AMBULATORY_CARE_PROVIDER_SITE_OTHER): Payer: Medicare HMO | Admitting: Family Medicine

## 2018-12-17 ENCOUNTER — Encounter: Payer: Self-pay | Admitting: Family Medicine

## 2018-12-17 VITALS — BP 136/78 | HR 108 | Temp 98.2°F | Ht 62.0 in | Wt 142.1 lb

## 2018-12-17 DIAGNOSIS — E1165 Type 2 diabetes mellitus with hyperglycemia: Secondary | ICD-10-CM | POA: Diagnosis not present

## 2018-12-17 DIAGNOSIS — R300 Dysuria: Secondary | ICD-10-CM | POA: Diagnosis not present

## 2018-12-17 DIAGNOSIS — R350 Frequency of micturition: Secondary | ICD-10-CM

## 2018-12-17 LAB — POC URINALSYSI DIPSTICK (AUTOMATED)
Bilirubin, UA: NEGATIVE
Glucose, UA: POSITIVE — AB
Ketones, UA: 15
Nitrite, UA: NEGATIVE
Protein, UA: NEGATIVE
RBC UA: NEGATIVE
Spec Grav, UA: 1.015 (ref 1.010–1.025)
Urobilinogen, UA: 0.2 E.U./dL
pH, UA: 5.5 (ref 5.0–8.0)
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
## 2018-12-17 NOTE — Patient Instructions (Signed)
We will culture your urine and let you know when it returns   AZO over the counter for urinary burning (pain) it ok to try if you want   Keep drinking water  Avoid spicy food and diet drinks  Watch the coffee intake- stop it if it makes you worse   Coming off tamoxifen may help

## 2018-12-18 LAB — URINE CULTURE
MICRO NUMBER:: 337228
Result:: NO GROWTH
SPECIMEN QUALITY:: ADEQUATE

## 2018-12-20 ENCOUNTER — Telehealth: Payer: Self-pay | Admitting: Family Medicine

## 2018-12-20 NOTE — Assessment & Plan Note (Signed)
No doubt poor glucose control is adding to it

## 2018-12-20 NOTE — Assessment & Plan Note (Addendum)
Acute on chronic Rev last ID note- believed to be colonized by e coli  Has also had yeast  Recommended no tx unless she develops symptoms of pyelonephritis currently  Exam is re assuring Urine cx sent Disc symptoms to watch for  Continues pessary/est cream/cranberry and yogurt Disc what to avoid that may worsen dysuria (acid/caffeine/artif sweetner) Hopeful that ending the tamoxifen will help

## 2018-12-20 NOTE — Telephone Encounter (Signed)
Please let her know urine culture shows no growth This is re assuring

## 2018-12-20 NOTE — Assessment & Plan Note (Signed)
Lab Results  Component Value Date   HGBA1C 8.7 (H) 11/03/2018   Glucose noted in urine Pt still declines further medication or adjustments

## 2018-12-21 NOTE — Telephone Encounter (Signed)
Pt notified of urine cx and Dr. Tower's comments  

## 2018-12-23 ENCOUNTER — Other Ambulatory Visit: Payer: Self-pay | Admitting: Family Medicine

## 2018-12-24 ENCOUNTER — Other Ambulatory Visit: Payer: Self-pay | Admitting: Family Medicine

## 2019-01-01 ENCOUNTER — Telehealth: Payer: Self-pay | Admitting: Oncology

## 2019-01-01 NOTE — Telephone Encounter (Signed)
Spoke with patient regarding appointment on 4/7. GM wanted to do a WebEx but patient does not have a computer or smart phone. Will message Dr. Jana Hakim to do a phone call visit.

## 2019-01-05 ENCOUNTER — Other Ambulatory Visit: Payer: Medicare HMO

## 2019-01-05 ENCOUNTER — Telehealth: Payer: Self-pay | Admitting: Oncology

## 2019-01-05 ENCOUNTER — Inpatient Hospital Stay: Payer: Medicare HMO | Admitting: Oncology

## 2019-01-05 ENCOUNTER — Ambulatory Visit: Payer: Medicare HMO

## 2019-01-05 ENCOUNTER — Telehealth: Payer: Self-pay

## 2019-01-05 NOTE — Telephone Encounter (Signed)
Patient was rescheduled to Friday due to her being a phone call visit. Spoke with patient and confirmed appointment.

## 2019-01-05 NOTE — Telephone Encounter (Signed)
Pt would like appt.  Pessary is uncomfortable.  [phone]

## 2019-01-05 NOTE — Telephone Encounter (Signed)
Forwarding Message to Dr. Kenton Kingfisher . Please advise no opening this week in your schedule

## 2019-01-05 NOTE — Telephone Encounter (Signed)
[facility] OK

## 2019-01-06 NOTE — Telephone Encounter (Signed)
Patient is schedule 01/07/19

## 2019-01-07 ENCOUNTER — Other Ambulatory Visit: Payer: Self-pay

## 2019-01-07 ENCOUNTER — Ambulatory Visit (INDEPENDENT_AMBULATORY_CARE_PROVIDER_SITE_OTHER): Payer: Medicare HMO | Admitting: Obstetrics & Gynecology

## 2019-01-07 ENCOUNTER — Encounter: Payer: Self-pay | Admitting: Obstetrics & Gynecology

## 2019-01-07 VITALS — BP 150/84 | Ht 62.0 in | Wt 142.0 lb

## 2019-01-07 DIAGNOSIS — N3946 Mixed incontinence: Secondary | ICD-10-CM

## 2019-01-07 DIAGNOSIS — N8111 Cystocele, midline: Secondary | ICD-10-CM

## 2019-01-07 DIAGNOSIS — N952 Postmenopausal atrophic vaginitis: Secondary | ICD-10-CM | POA: Diagnosis not present

## 2019-01-07 MED ORDER — CLOTRIMAZOLE-BETAMETHASONE 1-0.05 % EX CREA: 1.0000 "application " | TOPICAL_CREAM | Freq: Two times a day (BID) | CUTANEOUS | 2 refills | Status: DC

## 2019-01-07 NOTE — Progress Notes (Signed)
HPI:      [name] is a 83 y.o.  who presents today with complaints of worsening vaginal/vulvar irritation; feeling raw and uncomfortable most days.  No visible or palpable rash to her.  No discharge.  She does use the vaginal estrogen cream pea size dose 3 times weekly.  Concern for soap she uses, but she reports she has used same soap for years. She also presents for her pessary follow up and examination related to her pelvic floor weakening.  Pt reports tolerating the pessary well with  no vaginal bleeding and  no vaginal discharge.  Symptoms of pelvic floor weakening have greatly improved. She is voiding and defecating without difficulty. She currently has a #5 ring pessary.  PMHx: She  has a past medical history of Arthritis, Bleeding disorder ([place]), Breast cancer ([facility]) (10/11/13 dx), Breast cancer ([place]), Cervical cancer ([place]), Diabetes mellitus, Full dentures, GERD (gastroesophageal reflux disease), High cholesterol, History of stomach ulcers, Hypertension, Osteoarthritis, Osteopenia, Stress reaction (2009), Urinary incontinence, Uterus cancer ([place]), and Varicose veins. Also,  has a past surgical history that includes Abdominal hysterectomy (1964); Bladder surgery (1995); Abdominal US (2007); Sclerotherapy varicose veins (5/08); left breast bx (1/15); Breast lumpectomy with needle localization (Left, 11/10/2013); Breast biopsy (Left, 09/2013); Breast biopsy (Left, 12/2014); and Breast lumpectomy (Left, 10/2013)., family history includes Cancer in her brother, brother, father, and sister; Cancer (age of onset: 13) in an other family member; Heart attack in her mother.,  reports that she quit smoking about 23 years ago. Her smoking use included cigarettes. She has a 39.20 pack-year smoking history. She has never used smokeless tobacco. She reports that she does not drink alcohol or use drugs.  She has a current medication list which includes the following prescription(s): accu-chek softclix  lancets, acetaminophen, aspirin, onetouch verio, vitamin d, citalopram, conjugated estrogens, cyanocobalamin, cyanocobalamin, estradiol, glipizide, glucose blood, lisinopril, metformin, nystatin, nystatin-triamcinolone ointment, oxybutynin, pioglitazone, ranitidine, simvastatin, tamoxifen, terconazole, and clotrimazole-betamethasone. Also, is allergic to actos [pioglitazone]; alendronate sodium; boniva [ibandronate sodium]; ibandronic acid; lansoprazole; and omeprazole.  Review of Systems  All other systems reviewed and are negative.  Objective: BP (!) 150/84   Ht 5\' 2"  (1.575 m)   Wt 142 lb (64.4 kg)   BMI 25.97 kg/m  Physical Exam Constitutional:      General: She is not in acute distress.    Appearance: She is well-developed.  Genitourinary:     Pelvic exam was performed with patient supine.     Vagina normal.     No vaginal erythema or bleeding.     Genitourinary Comments: Cuff intact/ no lesions Absent uterus and cervix Gr 3 cystocele and rectocele Vag atrophy present  She has mild erythema of the thinned labia tissues.  No lesions or bleeding.  HENT:     Head: Normocephalic and atraumatic.     Nose: Nose normal.  Abdominal:     General: There is no distension.     Palpations: Abdomen is soft.     Tenderness: There is no abdominal tenderness.  Musculoskeletal: Normal range of motion.  Neurological:     Mental Status: She is alert and oriented to person, place, and time.     Cranial Nerves: No cranial nerve deficit.  Skin:    General: Skin is warm and dry.    Pessary Care Pessary removed and cleaned.  Vagina checked - without erosions - pessary replaced.  A/P: 1. Cystocele, midline Cont pessary  2. Mixed stress and urge urinary incontinence Doing  well  3. Vaginal atrophy Cont Premarin Vag Cream 3 times weekly Will add Lotrisone bream for exterior use to aid in irritation and discomfort    Twice daily for a week then PRN  Pessary was cleaned and replaced  today. Instructions given for care. Concerning symptoms to observe for are counseled to patient. Follow up scheduled for 3 months.  A total of 15 minutes were spent face-to-face with the patient during this encounter and over half of that time dealt with counseling and coordination of care.  Barnett Applebaum, MD, [facility], [facility] 01/07/2019  1:58 PM

## 2019-01-08 ENCOUNTER — Inpatient Hospital Stay: Payer: Medicare HMO | Attending: Oncology | Admitting: Oncology

## 2019-01-08 DIAGNOSIS — Z170 Estrogen receptor positive status [ER+]: Secondary | ICD-10-CM | POA: Diagnosis not present

## 2019-01-08 DIAGNOSIS — D0510 Intraductal carcinoma in situ of unspecified breast: Secondary | ICD-10-CM

## 2019-01-08 DIAGNOSIS — C50512 Malignant neoplasm of lower-outer quadrant of left female breast: Secondary | ICD-10-CM | POA: Diagnosis not present

## 2019-01-08 MED ORDER — TAMOXIFEN CITRATE 20 MG PO TABS: 20.0000 mg | ORAL_TABLET | Freq: Every day | ORAL | 4 refills | Status: DC
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
## 2019-01-11 ENCOUNTER — Telehealth: Payer: Self-pay | Admitting: Oncology

## 2019-01-11 NOTE — Telephone Encounter (Signed)
Tried to reach no voicemail available

## 2019-01-17 DIAGNOSIS — R69 Illness, unspecified: Secondary | ICD-10-CM | POA: Diagnosis not present

## 2019-02-09 ENCOUNTER — Encounter: Payer: Self-pay | Admitting: Family Medicine

## 2019-02-09 ENCOUNTER — Other Ambulatory Visit: Payer: Self-pay

## 2019-02-09 ENCOUNTER — Ambulatory Visit (INDEPENDENT_AMBULATORY_CARE_PROVIDER_SITE_OTHER)
Admission: RE | Admit: 2019-02-09 | Discharge: 2019-02-09 | Disposition: A | Discharge status: Home / Self Care | Payer: Medicare HMO | Source: Ambulatory Visit | Attending: Family Medicine | Admitting: Family Medicine

## 2019-02-09 ENCOUNTER — Ambulatory Visit (INDEPENDENT_AMBULATORY_CARE_PROVIDER_SITE_OTHER): Payer: Medicare HMO | Admitting: Family Medicine

## 2019-02-09 VITALS — BP 124/68 | HR 110 | Temp 98.9°F | Ht 62.0 in | Wt 141.2 lb

## 2019-02-09 DIAGNOSIS — S99921A Unspecified injury of right foot, initial encounter: Secondary | ICD-10-CM | POA: Diagnosis not present

## 2019-02-09 DIAGNOSIS — S79911A Unspecified injury of right hip, initial encounter: Secondary | ICD-10-CM

## 2019-02-09 DIAGNOSIS — S79912A Unspecified injury of left hip, initial encounter: Secondary | ICD-10-CM | POA: Diagnosis not present

## 2019-02-09 DIAGNOSIS — M79671 Pain in right foot: Secondary | ICD-10-CM

## 2019-02-09 DIAGNOSIS — M25551 Pain in right hip: Secondary | ICD-10-CM | POA: Diagnosis not present

## 2019-02-09 DIAGNOSIS — R2689 Other abnormalities of gait and mobility: Secondary | ICD-10-CM

## 2019-02-09 DIAGNOSIS — R296 Repeated falls: Secondary | ICD-10-CM

## 2019-02-09 DIAGNOSIS — M7989 Other specified soft tissue disorders: Secondary | ICD-10-CM | POA: Diagnosis not present

## 2019-02-09 NOTE — Assessment & Plan Note (Signed)
With poor balance Pt is open to ref to PT for balance (would not go in the past)  Ref done Disc imp of keeping phone on her at all times  Also using walker or cane for ambulation w/o assistance  Family is working to help adj her residence to prev falls
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
## 2019-02-09 NOTE — Patient Instructions (Addendum)
Start wearing phone around your neck -in case of falls  Also get a walker as you planned to use for walking   Have your family help you to clean out closet to make it safer   xrays now We will call you with results later today  Use ice on painful areas  Tylenol is ok  If worse pain or swelling later let me know Please use a cane or walker to help prevent falls I am also referring you to physical therapy for balance and fall prevention Rosaria Ferries will call you about that)   Take care of yourself and be careful

## 2019-02-09 NOTE — Assessment & Plan Note (Signed)
With some mild dorsal swelling and tenderness Xray ordered to r/o fracture Enc use of ice  Cane or walker as needed  Consider boot if any fractures  Already improved by visit however Tylenol prn  Update if not starting to improve in a week or if worsening

## 2019-02-09 NOTE — Assessment & Plan Note (Signed)
Ref to PT for gait Bettey Costa training (balance) -she agrees to go now  Enc use of walker or cane  Fall precautions discussed in detail

## 2019-02-09 NOTE — Assessment & Plan Note (Signed)
After a fall on her R side  Able to bear weight  Some pain on ext rotation - (though she has OA as well)  Xray today  Enc use of ice  Cane or walker /relative rest  Tylenol prn

## 2019-02-10 ENCOUNTER — Telehealth: Payer: Self-pay | Admitting: *Deleted

## 2019-02-10 NOTE — Telephone Encounter (Signed)
Called pt to advise of xray results and no answer and no VM (kept ringing)

## 2019-02-15 ENCOUNTER — Ambulatory Visit: Payer: Medicare HMO | Admitting: Obstetrics & Gynecology

## 2019-02-16 ENCOUNTER — Inpatient Hospital Stay: Admission: RE | Admit: 2019-02-16 | Payer: Medicare HMO | Source: Ambulatory Visit

## 2019-02-16 NOTE — Telephone Encounter (Signed)
Addressed through result notes  

## 2019-02-18 ENCOUNTER — Other Ambulatory Visit: Payer: Self-pay

## 2019-02-18 ENCOUNTER — Encounter: Payer: Self-pay | Admitting: Family Medicine

## 2019-02-18 ENCOUNTER — Ambulatory Visit (INDEPENDENT_AMBULATORY_CARE_PROVIDER_SITE_OTHER): Payer: Medicare HMO | Admitting: Family Medicine

## 2019-02-18 VITALS — BP 142/78 | HR 125 | Temp 98.1°F | Ht 62.0 in | Wt 140.1 lb

## 2019-02-18 DIAGNOSIS — N3946 Mixed incontinence: Secondary | ICD-10-CM | POA: Diagnosis not present

## 2019-02-18 DIAGNOSIS — I10 Essential (primary) hypertension: Secondary | ICD-10-CM | POA: Diagnosis not present

## 2019-02-18 DIAGNOSIS — R102 Pelvic and perineal pain unspecified side: Secondary | ICD-10-CM | POA: Insufficient documentation

## 2019-02-18 DIAGNOSIS — R000 Tachycardia, unspecified: Secondary | ICD-10-CM | POA: Diagnosis not present

## 2019-02-18 MED ORDER — METOPROLOL SUCCINATE ER 50 MG PO TB24: 50.0000 mg | ORAL_TABLET | Freq: Every day | ORAL | 11 refills | Status: DC

## 2019-02-18 NOTE — Patient Instructions (Signed)
I placed an urgent referral to see Dr Kenton Kingfisher for pessary problems and vulvar pain   Start the metoprolol xl 50 mg once daily for heart rate and blood pressure  If you have any problems or side effects let me know We will see you in august as planned

## 2019-02-18 NOTE — Assessment & Plan Note (Signed)
Pt seems fairly certain her pessary is not fitting right  Also she has more incontinence  No abnormal findings on exam except for labial tenderness  inst her to watch for rash/ or evidence of swelling or cyst  She continues tx for vaginal atrophy Ref her to her regular gyn for f/u  inst to call if symptoms worsen in the meantime

## 2019-02-18 NOTE — Assessment & Plan Note (Signed)
Pt thinks her pessary is no longer working-may need a change Ref to gyn to eval this  No dysuria or s/s of uti

## 2019-02-18 NOTE — Assessment & Plan Note (Signed)
We usually attribute this so anxiety  Now she c/o feelings of fast HR at  Home as well  bp is up also  Trial of metoprolol xl 50 mg daily  inst to call if side eff or problems F/u planned for august
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
## 2019-02-18 NOTE — Assessment & Plan Note (Signed)
BP: (!) 142/78    This has been somewhat labile and tachycardia has worsened with each visit  ? If related to anxiety  Will try metoprolol xl 50 mg once daily for both issues Disc poss side eff inst to stop medication and call if any problems or if sluggish or slow HR or low bp

## 2019-02-22 ENCOUNTER — Other Ambulatory Visit: Payer: Self-pay

## 2019-02-22 ENCOUNTER — Encounter: Payer: Self-pay | Admitting: Emergency Medicine

## 2019-02-22 ENCOUNTER — Emergency Department
Admission: EM | Admit: 2019-02-22 | Discharge: 2019-02-22 | Disposition: A | Discharge status: Home / Self Care | Payer: Medicare HMO | Attending: Emergency Medicine | Admitting: Emergency Medicine

## 2019-02-22 DIAGNOSIS — N3090 Cystitis, unspecified without hematuria: Secondary | ICD-10-CM | POA: Insufficient documentation

## 2019-02-22 DIAGNOSIS — Z87891 Personal history of nicotine dependence: Secondary | ICD-10-CM | POA: Diagnosis not present

## 2019-02-22 DIAGNOSIS — R300 Dysuria: Secondary | ICD-10-CM | POA: Diagnosis present

## 2019-02-22 DIAGNOSIS — E119 Type 2 diabetes mellitus without complications: Secondary | ICD-10-CM | POA: Insufficient documentation

## 2019-02-22 DIAGNOSIS — I10 Essential (primary) hypertension: Secondary | ICD-10-CM | POA: Diagnosis not present

## 2019-02-22 DIAGNOSIS — Z853 Personal history of malignant neoplasm of breast: Secondary | ICD-10-CM | POA: Insufficient documentation

## 2019-02-22 DIAGNOSIS — Z8541 Personal history of malignant neoplasm of cervix uteri: Secondary | ICD-10-CM | POA: Insufficient documentation

## 2019-02-22 DIAGNOSIS — N3000 Acute cystitis without hematuria: Secondary | ICD-10-CM | POA: Diagnosis not present

## 2019-02-22 LAB — URINALYSIS, COMPLETE (UACMP) WITH MICROSCOPIC
Bilirubin Urine: NEGATIVE
Glucose, UA: >=500 mg/dL — AB
Ketones, ur: NEGATIVE mg/dL
Nitrite: NEGATIVE
Protein, ur: NEGATIVE mg/dL
Specific Gravity, Urine: 1.013 (ref 1.005–1.030)
WBC, UA: >50 WBC/hpf — ABNORMAL HIGH (ref 0–5)
pH: 5 (ref 5.0–8.0)

## 2019-02-22 MED ORDER — CEPHALEXIN 500 MG PO CAPS: 500.0000 mg | ORAL_CAPSULE | Freq: Four times a day (QID) | ORAL | 0 refills | Status: AC

## 2019-02-22 MED ORDER — PHENAZOPYRIDINE HCL 200 MG PO TABS: 200.0000 mg | ORAL_TABLET | Freq: Three times a day (TID) | ORAL | 0 refills | Status: DC | PRN

## 2019-02-22 MED ORDER — CEPHALEXIN 500 MG PO CAPS
500.0000 mg | ORAL_CAPSULE | Freq: Once | ORAL | Status: AC
  Administered 2019-02-22: 16:00:00 500 mg via ORAL
  Filled 2019-02-22: qty 1

## 2019-02-22 MED ORDER — PHENAZOPYRIDINE HCL 200 MG PO TABS
200.0000 mg | ORAL_TABLET | Freq: Once | ORAL | Status: AC
  Administered 2019-02-22: 200 mg via ORAL
  Filled 2019-02-22: qty 1
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
## 2019-02-22 NOTE — ED Notes (Signed)
Pt ambulatory to toilet in room at this time with any difficulties

## 2019-02-22 NOTE — ED Notes (Signed)
Pt HR increased into the 130-140's at this time, EKG completed pt Dr. Cherylann Banas request.

## 2019-02-22 NOTE — ED Triage Notes (Signed)
Patient reports she is having pain with urination and bowel movement. States she believes her pessary is not in properly. Reports she has had these issues in the past. Patient states she has an appointment to follow up with Dr. Kenton Kingfisher on Wednesday but doesn't think she could wait that long. Patient also reports frequent urination.

## 2019-02-22 NOTE — ED Notes (Addendum)
Lower abd feels firm with palpation, tender to palpation. Bladder scan completed with max reading of 150 mL at this time, states pain when urinating. Pt also complains of pain in rectum, states she feels a heavy pressure. Pt has been having normal, soft stools daily. Denies any constipation. States she feel a week ago and feels that the fall may have displaced bladder device from having bladder tacked up.

## 2019-02-23 LAB — URINE CULTURE
Culture: <10000 — AB
Special Requests: NORMAL

## 2019-02-24 ENCOUNTER — Other Ambulatory Visit: Payer: Self-pay

## 2019-02-24 ENCOUNTER — Encounter: Payer: Self-pay | Admitting: Obstetrics & Gynecology

## 2019-02-24 ENCOUNTER — Ambulatory Visit (INDEPENDENT_AMBULATORY_CARE_PROVIDER_SITE_OTHER): Payer: Medicare HMO | Admitting: Obstetrics & Gynecology

## 2019-02-24 VITALS — BP 140/80 | Ht 62.0 in | Wt 142.0 lb

## 2019-02-24 DIAGNOSIS — N952 Postmenopausal atrophic vaginitis: Secondary | ICD-10-CM | POA: Diagnosis not present

## 2019-02-24 DIAGNOSIS — N3946 Mixed incontinence: Secondary | ICD-10-CM | POA: Diagnosis not present

## 2019-02-24 DIAGNOSIS — N8111 Cystocele, midline: Secondary | ICD-10-CM

## 2019-02-24 NOTE — Progress Notes (Signed)
HPI:      [name] is a 83 y.o. WF who presents today for concerns related to her vaginal discomfort and recent UTI, started ABX recently.  Constant pressure and some dysuria.  Desires pessary out as feels there is a connection to it.  Has had pessary for several months, last check up was 6 weeks ago.  Has not had any bleeding.  Takes vag ERT and Lotrisone prn for irritation and atrophy. She currently has a #5 ring w support pessary.  PMHx: She  has a past medical history of Arthritis, Bleeding disorder ([place]), Breast cancer ([place]) (10/11/13 dx), Breast cancer ([place]), Cervical cancer ([place]), Diabetes mellitus, Full dentures, GERD (gastroesophageal reflux disease), High cholesterol, History of stomach ulcers, Hypertension, Osteoarthritis, Osteopenia, Stress reaction (2009), Urinary incontinence, Uterus cancer ([place]), and Varicose veins. Also,  has a past surgical history that includes Abdominal hysterectomy (1964); Bladder surgery (1995); Abdominal US (2007); Sclerotherapy varicose veins (5/08); left breast bx (1/15); Breast lumpectomy with needle localization (Left, 11/10/2013); Breast biopsy (Left, 09/2013); Breast biopsy (Left, 12/2014); and Breast lumpectomy (Left, 10/2013)., family history includes Cancer in her brother, brother, father, and sister; Cancer (age of onset: 5) in an other family member; Heart attack in her mother.,  reports that she quit smoking about 23 years ago. Her smoking use included cigarettes. She has a 39.20 pack-year smoking history. She has never used smokeless tobacco. She reports that she does not drink alcohol or use drugs.  She has a current medication list which includes the following prescription(s): accu-chek softclix lancets, acetaminophen, aspirin, onetouch verio, cephalexin, vitamin d, citalopram, clotrimazole-betamethasone, conjugated estrogens, cyanocobalamin, cyanocobalamin, estradiol, glipizide, glucose blood, lisinopril, metformin, metoprolol succinate,  nystatin, nystatin-triamcinolone ointment, oxybutynin, phenazopyridine, pioglitazone, ranitidine, simvastatin, tamoxifen, and terconazole. Also, is allergic to actos [pioglitazone]; alendronate sodium; boniva [ibandronate sodium]; ibandronic acid; lansoprazole; and omeprazole.  Review of Systems  All other systems reviewed and are negative.   Objective: BP 140/80   Ht 5\' 2"  (1.575 m)   Wt 142 lb (64.4 kg)   BMI 25.97 kg/m  Physical Exam Constitutional:      General: She is not in acute distress.    Appearance: She is well-developed.  Genitourinary:     Pelvic exam was performed with patient supine.     Vagina normal.     No vaginal erythema or bleeding.     Genitourinary Comments: Cuff intact/ no lesions/ mild atrophy noted Cystocele and vaginal floor weakening noted Absent uterus and cervix No rash  HENT:     Head: Normocephalic and atraumatic.     Nose: Nose normal.  Abdominal:     General: There is no distension.     Palpations: Abdomen is soft.     Tenderness: There is no abdominal tenderness.  Musculoskeletal: Normal range of motion.  Neurological:     Mental Status: She is alert and oriented to person, place, and time.     Cranial Nerves: No cranial nerve deficit.  Skin:    General: Skin is warm and dry.    Pessary Care Pessary removed and cleaned.  Vagina checked - without erosions  A/P: 1. Cystocele, midline Pessary first line option. Also surgery an option.  Expectant management as well  2. Mixed stress and urge urinary incontinence Monitor sx's w pessary holiday  3. Vaginal atrophy Cont vag estrogen 2-3 x per week  Pessary holiday advised and asked for by pt.  Will leave out and follow symptoms closely. Consider replacement if prolapse sx's resume.  Also could try smaller pessary, risk of expulsion possible.  A total of 15 minutes were spent face-to-face with the patient during this encounter and over half of that time dealt with counseling and  coordination of care.  Barnett Applebaum, MD, [facility], [facility] 02/24/2019  1:44 PM

## 2019-03-01 ENCOUNTER — Emergency Department
Admission: EM | Admit: 2019-03-01 | Discharge: 2019-03-01 | Disposition: A | Discharge status: Home / Self Care | Payer: Medicare HMO | Attending: Emergency Medicine | Admitting: Emergency Medicine

## 2019-03-01 ENCOUNTER — Other Ambulatory Visit: Payer: Self-pay

## 2019-03-01 ENCOUNTER — Encounter: Payer: Self-pay | Admitting: Emergency Medicine

## 2019-03-01 ENCOUNTER — Emergency Department: Payer: Medicare HMO

## 2019-03-01 DIAGNOSIS — E119 Type 2 diabetes mellitus without complications: Secondary | ICD-10-CM | POA: Diagnosis not present

## 2019-03-01 DIAGNOSIS — Z853 Personal history of malignant neoplasm of breast: Secondary | ICD-10-CM | POA: Diagnosis not present

## 2019-03-01 DIAGNOSIS — Z87891 Personal history of nicotine dependence: Secondary | ICD-10-CM | POA: Diagnosis not present

## 2019-03-01 DIAGNOSIS — R102 Pelvic and perineal pain: Secondary | ICD-10-CM | POA: Diagnosis not present

## 2019-03-01 DIAGNOSIS — K760 Fatty (change of) liver, not elsewhere classified: Secondary | ICD-10-CM | POA: Diagnosis not present

## 2019-03-01 DIAGNOSIS — R110 Nausea: Secondary | ICD-10-CM | POA: Diagnosis not present

## 2019-03-01 DIAGNOSIS — Z79899 Other long term (current) drug therapy: Secondary | ICD-10-CM | POA: Insufficient documentation

## 2019-03-01 DIAGNOSIS — N3090 Cystitis, unspecified without hematuria: Secondary | ICD-10-CM | POA: Insufficient documentation

## 2019-03-01 DIAGNOSIS — K5730 Diverticulosis of large intestine without perforation or abscess without bleeding: Secondary | ICD-10-CM | POA: Diagnosis not present

## 2019-03-01 DIAGNOSIS — R300 Dysuria: Secondary | ICD-10-CM | POA: Diagnosis not present

## 2019-03-01 DIAGNOSIS — I10 Essential (primary) hypertension: Secondary | ICD-10-CM | POA: Diagnosis not present

## 2019-03-01 LAB — URINALYSIS, COMPLETE (UACMP) WITH MICROSCOPIC
Bacteria, UA: NONE SEEN
Specific Gravity, Urine: 1.019 (ref 1.005–1.030)
WBC, UA: >50 WBC/hpf — ABNORMAL HIGH (ref 0–5)

## 2019-03-01 LAB — CBC
HCT: 40 % (ref 36.0–46.0)
Hemoglobin: 12.7 g/dL (ref 12.0–15.0)
MCH: 28.4 pg (ref 26.0–34.0)
MCHC: 31.8 g/dL (ref 30.0–36.0)
MCV: 89.5 fL (ref 80.0–100.0)
Platelets: 286 10*3/uL (ref 150–400)
RBC: 4.47 MIL/uL (ref 3.87–5.11)
RDW: 13 % (ref 11.5–15.5)
WBC: 8.3 10*3/uL (ref 4.0–10.5)
nRBC: 0 % (ref 0.0–0.2)

## 2019-03-01 LAB — COMPREHENSIVE METABOLIC PANEL
ALT: 15 U/L (ref 0–44)
AST: 17 U/L (ref 15–41)
Albumin: 4 g/dL (ref 3.5–5.0)
Alkaline Phosphatase: 92 U/L (ref 38–126)
Anion gap: 10 (ref 5–15)
BUN: 9 mg/dL (ref 8–23)
CO2: 24 mmol/L (ref 22–32)
Calcium: 9.3 mg/dL (ref 8.9–10.3)
Chloride: 99 mmol/L (ref 98–111)
Creatinine, Ser: 0.6 mg/dL (ref 0.44–1.00)
GFR calc Af Amer: >60 mL/min (ref >60)
GFR calc non Af Amer: >60 mL/min (ref >60)
Glucose, Bld: 221 mg/dL — ABNORMAL HIGH (ref 70–99)
Potassium: 4 mmol/L (ref 3.5–5.1)
Sodium: 133 mmol/L — ABNORMAL LOW (ref 135–145)
Total Bilirubin: 0.5 mg/dL (ref 0.3–1.2)
Total Protein: 7.2 g/dL (ref 6.5–8.1)

## 2019-03-01 LAB — LIPASE, BLOOD: Lipase: 39 U/L (ref 11–51)

## 2019-03-01 MED ORDER — SULFAMETHOXAZOLE-TRIMETHOPRIM 800-160 MG PO TABS: 1.0000 | ORAL_TABLET | Freq: Once | ORAL | Status: DC

## 2019-03-01 MED ORDER — IOHEXOL 300 MG/ML  SOLN
100.0000 mL | Freq: Once | INTRAMUSCULAR | Status: AC | PRN
  Administered 2019-03-01: 100 mL via INTRAVENOUS

## 2019-03-01 MED ORDER — SULFAMETHOXAZOLE-TRIMETHOPRIM 800-160 MG PO TABS: 1.0000 | ORAL_TABLET | Freq: Two times a day (BID) | ORAL | 0 refills | Status: AC

## 2019-03-01 MED ORDER — SULFAMETHOXAZOLE-TRIMETHOPRIM 800-160 MG PO TABS
1.0000 | ORAL_TABLET | Freq: Once | ORAL | Status: AC
  Administered 2019-03-01: 1 via ORAL
  Filled 2019-03-01: qty 1

## 2019-03-01 MED ORDER — SODIUM CHLORIDE 0.9% FLUSH
3.0000 mL | Freq: Once | INTRAVENOUS | Status: AC
  Administered 2019-03-01: 3 mL via INTRAVENOUS

## 2019-03-01 NOTE — ED Notes (Signed)
Bedside commode placed at pt bedside, asked to use call light to assist before she gets up to use it.
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
## 2019-03-01 NOTE — Discharge Instructions (Addendum)
You should stop the antibiotic you were given on the last visit (cephalexin).  Start the new antibiotic (Bactrim, also called trimethoprim-sulfamethoxazole) and finish the full course.  You should make an appointment to follow-up with your doctor in 1 to 2 weeks.  Return to the ER immediately for new, worsening, or persistent pain, problems urinating, fever, weakness, vomiting, or any other new or worsening symptoms that concern you.  This may indicate a resistant urinary tract infection that will require IV antibiotics.

## 2019-03-01 NOTE — ED Triage Notes (Signed)
Lower abdominal pain and dysuria. Symptoms began one week and has been taking antibiotic however states symptoms are not improving.

## 2019-03-03 ENCOUNTER — Ambulatory Visit (INDEPENDENT_AMBULATORY_CARE_PROVIDER_SITE_OTHER): Payer: Medicare HMO | Admitting: Family Medicine

## 2019-03-03 ENCOUNTER — Encounter: Payer: Self-pay | Admitting: Family Medicine

## 2019-03-03 DIAGNOSIS — N3946 Mixed incontinence: Secondary | ICD-10-CM | POA: Diagnosis not present

## 2019-03-03 DIAGNOSIS — R102 Pelvic and perineal pain: Secondary | ICD-10-CM | POA: Diagnosis not present

## 2019-03-03 DIAGNOSIS — R300 Dysuria: Secondary | ICD-10-CM

## 2019-03-03 NOTE — Assessment & Plan Note (Signed)
With pelvic pain (vaginal and rectal)-she thinks was caused by a pessary Pos UA in ED despite keflex She was changed to bactrim and cx is inconclusive CT of abd/pelvis showed thickening of bladder wall consistent with cystitis Reviewed hospital records, lab results and studies in detail   She is interested in urology consult for this and also incontinence/frequent infections and pelvic pain  Referral placed She will take 7 d of bactrim (starting to improve a little) Enc good fluid intake

## 2019-03-03 NOTE — Assessment & Plan Note (Signed)
Ongoing  Has seen gyn Took pessary out- ? If that caused it  CT reassuring - but did see bladder wall thickening Being tx for cystitis  Having frequent infections Ref to urology done

## 2019-03-03 NOTE — Patient Instructions (Signed)
I want to refer you to a urologist for the issues with painful urination and pelvic pain  Keep the pessary out  Continue the 7 days of bactrim Drink lots of fluids If symptoms worsen please let us know right away  [place] office will call you about the urologist
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
## 2019-03-07 DIAGNOSIS — R69 Illness, unspecified: Secondary | ICD-10-CM | POA: Diagnosis not present

## 2019-03-28 ENCOUNTER — Other Ambulatory Visit: Payer: Self-pay | Admitting: Family Medicine

## 2019-04-06 ENCOUNTER — Encounter: Payer: Self-pay | Admitting: Obstetrics & Gynecology

## 2019-04-06 ENCOUNTER — Ambulatory Visit (INDEPENDENT_AMBULATORY_CARE_PROVIDER_SITE_OTHER): Payer: Medicare HMO | Admitting: Obstetrics & Gynecology

## 2019-04-06 ENCOUNTER — Other Ambulatory Visit: Payer: Self-pay

## 2019-04-06 VITALS — BP 140/80 | Wt 143.0 lb

## 2019-04-06 DIAGNOSIS — N3946 Mixed incontinence: Secondary | ICD-10-CM

## 2019-04-06 DIAGNOSIS — N8111 Cystocele, midline: Secondary | ICD-10-CM

## 2019-04-06 NOTE — Progress Notes (Signed)
°  HPI:      [name] is a 83 y.o.  who presents today for her pessary follow up and examination related to her pelvic floor weakening.  Pt reports not tolerating the pessary holiday well with no vaginal bleeding and no vaginal discharge, but symptoms of pelvic floor weakening have greatly worsened since its removal a few weeks ago. She has used a ring #5 pessary in the past.  PMHx: She  has a past medical history of Arthritis, Bleeding disorder ([place]), Breast cancer ([place]) (10/11/13 dx), Breast cancer ([place]), Cervical cancer ([place]), Diabetes mellitus, Full dentures, GERD (gastroesophageal reflux disease), High cholesterol, History of stomach ulcers, Hypertension, Osteoarthritis, Osteopenia, Stress reaction (2009), Urinary incontinence, Uterus cancer ([place]), and Varicose veins. Also,  has a past surgical history that includes Abdominal hysterectomy (1964); Bladder surgery (1995); Abdominal US (2007); Sclerotherapy varicose veins (5/08); left breast bx (1/15); Breast lumpectomy with needle localization (Left, 11/10/2013); Breast biopsy (Left, 09/2013); Breast biopsy (Left, 12/2014); and Breast lumpectomy (Left, 10/2013)., family history includes Cancer in her brother, brother, father, and sister; Cancer (age of onset: 55) in an other family member; Heart attack in her mother.,  reports that she quit smoking about 23 years ago. Her smoking use included cigarettes. She has a 39.20 pack-year smoking history. She has never used smokeless tobacco. She reports that she does not drink alcohol or use drugs.  She has a current medication list which includes the following prescription(s): accu-chek softclix lancets, acetaminophen, aspirin, onetouch verio, vitamin d, citalopram, clotrimazole-betamethasone, conjugated estrogens, cyanocobalamin, cyanocobalamin, estradiol, glipizide xl, glucose blood, lisinopril, metformin, metoprolol succinate, nystatin, nystatin-triamcinolone ointment, pioglitazone, ranitidine,  simvastatin, tamoxifen, and terconazole. Also, is allergic to actos [pioglitazone]; alendronate sodium; boniva [ibandronate sodium]; ibandronic acid; lansoprazole; and omeprazole.  Review of Systems  All other systems reviewed and are negative.   Objective: BP 140/80    Wt 143 lb (64.9 kg)    BMI 26.16 kg/m  Physical Exam Constitutional:      General: She is not in acute distress.    Appearance: She is well-developed.  Genitourinary:     Pelvic exam was performed with patient supine.     Vagina normal.     No vaginal erythema or bleeding.     Genitourinary Comments: Cuff intact/ no lesions/ mild atrophy noted Cystocele and vaginal floor weakening noted Absent uterus and cervix  HENT:     Head: Normocephalic and atraumatic.     Nose: Nose normal.  Abdominal:     General: There is no distension.     Palpations: Abdomen is soft.     Tenderness: There is no abdominal tenderness.  Musculoskeletal: Normal range of motion.  Neurological:     Mental Status: She is alert and oriented to person, place, and time.     Cranial Nerves: No cranial nerve deficit.  Skin:    General: Skin is warm and dry.   Pessary Care Pessary replaced. Ring #5  A/P:1. Cystocele, midline 2. Mixed stress and urge urinary incontinence Pessary was cleaned and replaced today. Instructions given for care. Concerning symptoms to observe for are counseled to patient. Follow up scheduled for 3 months.  A total of 15 minutes were spent face-to-face with the patient during this encounter and over half of that time dealt with counseling and coordination of care.  Barnett Applebaum, MD, [facility], [facility] 04/06/2019  2:41 PM

## 2019-04-08 ENCOUNTER — Telehealth: Payer: Self-pay | Admitting: Oncology

## 2019-04-08 ENCOUNTER — Other Ambulatory Visit: Payer: Self-pay | Admitting: Family Medicine

## 2019-04-08 NOTE — Telephone Encounter (Signed)
Called patient regarding upcoming Webex appointment, left a voicemail and this will be a telephone visit due to no communication to set up Webex.

## 2019-04-09 DIAGNOSIS — R69 Illness, unspecified: Secondary | ICD-10-CM | POA: Diagnosis not present

## 2019-04-12 ENCOUNTER — Other Ambulatory Visit: Payer: Medicare HMO

## 2019-04-12 ENCOUNTER — Inpatient Hospital Stay: Payer: Medicare HMO | Attending: Oncology | Admitting: Oncology

## 2019-04-12 DIAGNOSIS — Z87891 Personal history of nicotine dependence: Secondary | ICD-10-CM | POA: Diagnosis not present

## 2019-04-12 DIAGNOSIS — Z7982 Long term (current) use of aspirin: Secondary | ICD-10-CM | POA: Diagnosis not present

## 2019-04-12 DIAGNOSIS — Z79899 Other long term (current) drug therapy: Secondary | ICD-10-CM | POA: Diagnosis not present

## 2019-04-12 DIAGNOSIS — Z170 Estrogen receptor positive status [ER+]: Secondary | ICD-10-CM

## 2019-04-12 DIAGNOSIS — Z79810 Long term (current) use of selective estrogen receptor modulators (SERMs): Secondary | ICD-10-CM

## 2019-04-12 DIAGNOSIS — C50512 Malignant neoplasm of lower-outer quadrant of left female breast: Secondary | ICD-10-CM | POA: Diagnosis not present
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
## 2019-04-13 ENCOUNTER — Telehealth: Payer: Self-pay | Admitting: Oncology

## 2019-04-13 NOTE — Telephone Encounter (Signed)
I could not reach patient the phone would just ring, no voicemail

## 2019-04-19 ENCOUNTER — Ambulatory Visit: Payer: Medicare HMO | Admitting: Urology

## 2019-04-19 DIAGNOSIS — Z1231 Encounter for screening mammogram for malignant neoplasm of breast: Secondary | ICD-10-CM | POA: Diagnosis not present

## 2019-05-11 ENCOUNTER — Encounter: Payer: Self-pay | Admitting: Family Medicine

## 2019-05-11 ENCOUNTER — Ambulatory Visit (INDEPENDENT_AMBULATORY_CARE_PROVIDER_SITE_OTHER): Payer: Medicare HMO | Admitting: Family Medicine

## 2019-05-11 ENCOUNTER — Other Ambulatory Visit: Payer: Self-pay

## 2019-05-11 VITALS — BP 152/84 | HR 117 | Temp 98.1°F | Ht 62.0 in | Wt 143.9 lb

## 2019-05-11 DIAGNOSIS — R300 Dysuria: Secondary | ICD-10-CM | POA: Diagnosis not present

## 2019-05-11 DIAGNOSIS — R8290 Unspecified abnormal findings in urine: Secondary | ICD-10-CM

## 2019-05-11 DIAGNOSIS — N3000 Acute cystitis without hematuria: Secondary | ICD-10-CM | POA: Diagnosis not present

## 2019-05-11 LAB — POCT URINALYSIS DIPSTICK
Bilirubin, UA: NEGATIVE
Blood, UA: NEGATIVE
Glucose, UA: POSITIVE — AB
Ketones, UA: NEGATIVE
Nitrite, UA: NEGATIVE
Protein, UA: NEGATIVE
Spec Grav, UA: 1.025 (ref 1.010–1.025)
Urobilinogen, UA: 0.2 E.U./dL
pH, UA: 5.5 (ref 5.0–8.0)

## 2019-05-11 MED ORDER — SULFAMETHOXAZOLE-TRIMETHOPRIM 800-160 MG PO TABS: 1.0000 | ORAL_TABLET | Freq: Two times a day (BID) | ORAL | 0 refills | Status: DC

## 2019-05-11 NOTE — Assessment & Plan Note (Signed)
Pt has done better since pessary was changed last month but now having dysuria (no pelvic pain) Cover with bactrim  Enc better fluid intake-will work on it  Noted glucose in urine (pt states control is actually getting better)  cx pending Handout given on uti  Update if not starting to improve in a several days  or if worsening
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
## 2019-05-11 NOTE — Patient Instructions (Addendum)
Please increase your water intake  Take the bactrim as directed - I suspect you have a uti   We will culture your urine and call you when we get a result  In the meantime if symptoms worsen please let us know   Always wipe front to back to avoid utis  Let us know if loose stools do not improve

## 2019-05-13 LAB — URINE CULTURE
MICRO NUMBER:: 758946
SPECIMEN QUALITY:: ADEQUATE

## 2019-05-18 ENCOUNTER — Ambulatory Visit: Payer: Medicare HMO

## 2019-05-18 ENCOUNTER — Ambulatory Visit (INDEPENDENT_AMBULATORY_CARE_PROVIDER_SITE_OTHER): Payer: Medicare HMO

## 2019-05-18 VITALS — Ht 62.0 in | Wt 144.0 lb

## 2019-05-18 DIAGNOSIS — Z0000 Encounter for general adult medical examination without abnormal findings: Secondary | ICD-10-CM

## 2019-05-18 NOTE — Patient Instructions (Signed)
Laura Bell , Thank you for taking time to come for your Medicare Wellness Visit. I appreciate your ongoing commitment to your health goals. Please review the following plan we discussed and let me know if I can assist you in the future.   Screening recommendations/referrals: Colonoscopy: not required Mammogram: 03/2019 Bone Density: 05/2018 Recommended yearly ophthalmology/optometry visit for glaucoma screening and checkup Recommended yearly dental visit for hygiene and checkup  Vaccinations: Influenza vaccine: 06/2018 Pneumococcal vaccine: 03/2016 Tdap vaccine: 03/2011 Shingles vaccine: discussed    Advanced directives: Advance directive discussed with you today. Even though you declined this today please call our office should you change your mind and we can give you the proper paperwork for you to fill out.   Conditions/risks identified: overweight  Next appointment: 05/25/2019 at 3:15   Preventive Care 50 Years and Older, Female Preventive care refers to lifestyle choices and visits with your health care provider that can promote health and wellness. What does preventive care include?  A yearly physical exam. This is also called an annual well check.  Dental exams once or twice a year.  Routine eye exams. Ask your health care provider how often you should have your eyes checked.  Personal lifestyle choices, including:  Daily care of your teeth and gums.  Regular physical activity.  Eating a healthy diet.  Avoiding tobacco and drug use.  Limiting alcohol use.  Practicing safe sex.  Taking low-dose aspirin every day.  Taking vitamin and mineral supplements as recommended by your health care provider. What happens during an annual well check? The services and screenings done by your health care provider during your annual well check will depend on your age, overall health, lifestyle risk factors, and family history of disease. Counseling  Your health care provider  may ask you questions about your:  Alcohol use.  Tobacco use.  Drug use.  Emotional well-being.  Home and relationship well-being.  Sexual activity.  Eating habits.  History of falls.  Memory and ability to understand (cognition).  Work and work Statistician.  Reproductive health. Screening  You may have the following tests or measurements:  Height, weight, and BMI.  Blood pressure.  Lipid and cholesterol levels. These may be checked every 5 years, or more frequently if you are over 49 years old.  Skin check.  Lung cancer screening. You may have this screening every year starting at age 22 if you have a 30-pack-year history of smoking and currently smoke or have quit within the past 15 years.  Fecal occult blood test (FOBT) of the stool. You may have this test every year starting at age 40.  Flexible sigmoidoscopy or colonoscopy. You may have a sigmoidoscopy every 5 years or a colonoscopy every 10 years starting at age 44.  Hepatitis C blood test.  Hepatitis B blood test.  Sexually transmitted disease (STD) testing.  Diabetes screening. This is done by checking your blood sugar (glucose) after you have not eaten for a while (fasting). You may have this done every 1-3 years.  Bone density scan. This is done to screen for osteoporosis. You may have this done starting at age 24.  Mammogram. This may be done every 1-2 years. Talk to your health care provider about how often you should have regular mammograms. Talk with your health care provider about your test results, treatment options, and if necessary, the need for more tests. Vaccines  Your health care provider may recommend certain vaccines, such as:  Influenza vaccine. This is recommended  every year.  Tetanus, diphtheria, and acellular pertussis (Tdap, Td) vaccine. You may need a Td booster every 10 years.  Zoster vaccine. You may need this after age 43.  Pneumococcal 13-valent conjugate (PCV13) vaccine.  One dose is recommended after age 83.  Pneumococcal polysaccharide (PPSV23) vaccine. One dose is recommended after age 39. Talk to your health care provider about which screenings and vaccines you need and how often you need them. This information is not intended to replace advice given to you by your health care provider. Make sure you discuss any questions you have with your health care provider. Document Released: 10/13/2015 Document Revised: 06/05/2016 Document Reviewed: 07/18/2015 Elsevier Interactive Patient Education  2017 [employer] Prevention in the Home Falls can cause injuries. They can happen to people of all ages. There are many things you can do to make your home safe and to help prevent falls. What can I do on the outside of my home?  Regularly fix the edges of walkways and driveways and fix any cracks.  Remove anything that might make you trip as you walk through a door, such as a raised step or threshold.  Trim any bushes or trees on the path to your home.  Use bright outdoor lighting.  Clear any walking paths of anything that might make someone trip, such as rocks or tools.  Regularly check to see if handrails are loose or broken. Make sure that both sides of any steps have handrails.  Any raised decks and porches should have guardrails on the edges.  Have any leaves, snow, or ice cleared regularly.  Use sand or salt on walking paths during winter.  Clean up any spills in your garage right away. This includes oil or grease spills. What can I do in the bathroom?  Use night lights.  Install grab bars by the toilet and in the tub and shower. Do not use towel bars as grab bars.  Use non-skid mats or decals in the tub or shower.  If you need to sit down in the shower, use a plastic, non-slip stool.  Keep the floor dry. Clean up any water that spills on the floor as soon as it happens.  Remove soap buildup in the tub or shower regularly.  Attach bath  mats securely with double-sided non-slip rug tape.  Do not have throw rugs and other things on the floor that can make you trip. What can I do in the bedroom?  Use night lights.  Make sure that you have a light by your bed that is easy to reach.  Do not use any sheets or blankets that are too big for your bed. They should not hang down onto the floor.  Have a firm chair that has side arms. You can use this for support while you get dressed.  Do not have throw rugs and other things on the floor that can make you trip. What can I do in the kitchen?  Clean up any spills right away.  Avoid walking on wet floors.  Keep items that you use a lot in easy-to-reach places.  If you need to reach something above you, use a strong step stool that has a grab bar.  Keep electrical cords out of the way.  Do not use floor polish or wax that makes floors slippery. If you must use wax, use non-skid floor wax.  Do not have throw rugs and other things on the floor that can make you trip. What  can I do with my stairs?  Do not leave any items on the stairs.  Make sure that there are handrails on both sides of the stairs and use them. Fix handrails that are broken or loose. Make sure that handrails are as long as the stairways.  Check any carpeting to make sure that it is firmly attached to the stairs. Fix any carpet that is loose or worn.  Avoid having throw rugs at the top or bottom of the stairs. If you do have throw rugs, attach them to the floor with carpet tape.  Make sure that you have a light switch at the top of the stairs and the bottom of the stairs. If you do not have them, ask someone to add them for you. What else can I do to help prevent falls?  Wear shoes that:  Do not have high heels.  Have rubber bottoms.  Are comfortable and fit you well.  Are closed at the toe. Do not wear sandals.  If you use a stepladder:  Make sure that it is fully opened. Do not climb a closed  stepladder.  Make sure that both sides of the stepladder are locked into place.  Ask someone to hold it for you, if possible.  Clearly mark and make sure that you can see:  Any grab bars or handrails.  First and last steps.  Where the edge of each step is.  Use tools that help you move around (mobility aids) if they are needed. These include:  Canes.  Walkers.  Scooters.  Crutches.  Turn on the lights when you go into a dark area. Replace any light bulbs as soon as they burn out.  Set up your furniture so you have a clear path. Avoid moving your furniture around.  If any of your floors are uneven, fix them.  If there are any pets around you, be aware of where they are.  Review your medicines with your doctor. Some medicines can make you feel dizzy. This can increase your chance of falling. Ask your doctor what other things that you can do to help prevent falls. This information is not intended to replace advice given to you by your health care provider. Make sure you discuss any questions you have with your health care provider. Document Released: 07/13/2009 Document Revised: 02/22/2016 Document Reviewed: 10/21/2014 Elsevier Interactive Patient Education  2017 [employer].

## 2019-05-18 NOTE — Progress Notes (Signed)
PCP notes:  Health Maintenance:  A1C is due.  Abnormal Screenings:  Fall screen: Patient had 2 falls  Patient concerns:  None  Nurse concerns:  None  Next PCP appt.: 05/25/2019 at 3:15
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
## 2019-05-25 ENCOUNTER — Encounter: Payer: Self-pay | Admitting: Family Medicine

## 2019-05-25 ENCOUNTER — Other Ambulatory Visit: Payer: Self-pay

## 2019-05-25 ENCOUNTER — Ambulatory Visit (INDEPENDENT_AMBULATORY_CARE_PROVIDER_SITE_OTHER): Payer: Medicare HMO | Admitting: Family Medicine

## 2019-05-25 VITALS — BP 138/82 | HR 103 | Temp 98.3°F | Ht 61.0 in | Wt 142.6 lb

## 2019-05-25 DIAGNOSIS — N3946 Mixed incontinence: Secondary | ICD-10-CM

## 2019-05-25 DIAGNOSIS — I10 Essential (primary) hypertension: Secondary | ICD-10-CM | POA: Diagnosis not present

## 2019-05-25 DIAGNOSIS — E538 Deficiency of other specified B group vitamins: Secondary | ICD-10-CM | POA: Diagnosis not present

## 2019-05-25 DIAGNOSIS — Z0000 Encounter for general adult medical examination without abnormal findings: Secondary | ICD-10-CM | POA: Diagnosis not present

## 2019-05-25 DIAGNOSIS — Z23 Encounter for immunization: Secondary | ICD-10-CM | POA: Diagnosis not present

## 2019-05-25 DIAGNOSIS — E663 Overweight: Secondary | ICD-10-CM | POA: Diagnosis not present

## 2019-05-25 DIAGNOSIS — E1165 Type 2 diabetes mellitus with hyperglycemia: Secondary | ICD-10-CM

## 2019-05-25 DIAGNOSIS — E1169 Type 2 diabetes mellitus with other specified complication: Secondary | ICD-10-CM

## 2019-05-25 DIAGNOSIS — K219 Gastro-esophageal reflux disease without esophagitis: Secondary | ICD-10-CM | POA: Diagnosis not present

## 2019-05-25 DIAGNOSIS — E785 Hyperlipidemia, unspecified: Secondary | ICD-10-CM | POA: Diagnosis not present

## 2019-05-25 DIAGNOSIS — F419 Anxiety disorder, unspecified: Secondary | ICD-10-CM

## 2019-05-25 DIAGNOSIS — M8589 Other specified disorders of bone density and structure, multiple sites: Secondary | ICD-10-CM

## 2019-05-25 DIAGNOSIS — D0510 Intraductal carcinoma in situ of unspecified breast: Secondary | ICD-10-CM

## 2019-05-25 MED ORDER — LISINOPRIL 40 MG PO TABS: 40.0000 mg | ORAL_TABLET | Freq: Every day | ORAL | 3 refills | Status: DC

## 2019-05-25 MED ORDER — METFORMIN HCL 1000 MG PO TABS: ORAL_TABLET | ORAL | 3 refills | Status: DC

## 2019-05-25 MED ORDER — CITALOPRAM HYDROBROMIDE 40 MG PO TABS: 40.0000 mg | ORAL_TABLET | Freq: Every day | ORAL | 3 refills | Status: DC

## 2019-05-25 MED ORDER — GLIPIZIDE ER 10 MG PO TB24: ORAL_TABLET | ORAL | 3 refills | Status: DC

## 2019-05-25 MED ORDER — SIMVASTATIN 20 MG PO TABS: ORAL_TABLET | ORAL | 3 refills | Status: DC

## 2019-05-25 MED ORDER — FAMOTIDINE 40 MG PO TABS: 40.0000 mg | ORAL_TABLET | Freq: Every day | ORAL | 3 refills | Status: AC

## 2019-05-25 NOTE — Assessment & Plan Note (Signed)
Upcoming f/u with Dr Matilde Sprang (urology)

## 2019-05-25 NOTE — Patient Instructions (Addendum)
Let's increase citalopram to 40 mg daily - for anxiety /this should help  Flu shot today  Labs today   Try to get 1200-1500 mg of calcium per day with at least 1000 iu of vitamin D - for bone health  This may help your diarrhea as well   Try the generic pepcid (famotidine) for acid reflux   I would like to see if your insurance would cover Prolia injections for osteoporosis   Be sure to make your eye doctor appointment for diabetic eye exam -you are overdue

## 2019-05-25 NOTE — Assessment & Plan Note (Signed)
bp in fair control at this time  BP Readings from Last 1 Encounters:  05/25/19 138/82   No changes needed Most recent labs reviewed  Disc lifstyle change with low sodium diet and exercise

## 2019-05-25 NOTE — Assessment & Plan Note (Signed)
9/19 worse on dexa Past h/o tamoxifen  Intol of bisphosphenates  She will again attempt to find out if prolia would be covered Enc ca and D supplement and exercise as tolerated

## 2019-05-25 NOTE — Assessment & Plan Note (Signed)
A1C today  Not well controlled but pt declines any additional medication or changes She watches diet  Will schedule her own eye doctor appt-overdue

## 2019-05-25 NOTE — Assessment & Plan Note (Signed)
Level today  Oral supplementation
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
## 2019-05-25 NOTE — Assessment & Plan Note (Signed)
Worse off ranitidine Will try famotidine 40 mg daily and update if no improvement

## 2019-05-25 NOTE — Assessment & Plan Note (Signed)
Worse lately  Much worry esp at night  Reviewed stressors/ coping techniques/symptoms/ support sources/ tx options and side effects in detail today We will inc celexa to 40 mg as tolerated Discussed expectations of SSRI medication including time to effectiveness and mechanism of action, also poss of side effects (early and late)- including mental fuzziness, weight or appetite change, nausea and poss of worse dep or anxiety (even suicidal thoughts)  Pt voiced understanding and will stop med and update if this occurs

## 2019-05-25 NOTE — Assessment & Plan Note (Signed)
Disc goals for lipids and reasons to control them Rev last labs with pt Rev low sat fat diet in detail Labs today on simvastatin Expect it may be up after missing some doses

## 2019-05-25 NOTE — Assessment & Plan Note (Signed)
Planning f/u with Dr Marlou Starks and also Dr Jana Hakim 7 y  Off tamoxifen now No c/o

## 2019-05-25 NOTE — Assessment & Plan Note (Signed)
Discussed how this problem influences overall health and the risks it imposes  Reviewed plan for weight loss with lower calorie diet (via better food choices and also portion control or program like weight watchers) and exercise building up to or more than 30 minutes 5 days per week including some aerobic activity    

## 2019-05-25 NOTE — Assessment & Plan Note (Signed)
Reviewed health habits including diet and exercise and skin cancer prevention Reviewed appropriate screening tests for age  Also reviewed health mt list, fam hx and immunization status , as well as social and family history   See HPI amw reviewed  Disc fall prevention  Flu vaccine today  Labs today  Disc diarrhea-? If side eff to metformin -she will watch this and update

## 2019-05-26 LAB — COMPREHENSIVE METABOLIC PANEL
ALT: 16 U/L (ref 0–35)
AST: 15 U/L (ref 0–37)
Albumin: 4.3 g/dL (ref 3.5–5.2)
Alkaline Phosphatase: 56 U/L (ref 39–117)
BUN: 8 mg/dL (ref 6–23)
CO2: 26 mEq/L (ref 19–32)
Calcium: 9.9 mg/dL (ref 8.4–10.5)
Chloride: 98 mEq/L (ref 96–112)
Creatinine, Ser: 0.58 mg/dL (ref 0.40–1.20)
GFR: 99.75 mL/min (ref >60.00)
Glucose, Bld: 101 mg/dL — ABNORMAL HIGH (ref 70–99)
Potassium: 4 mEq/L (ref 3.5–5.1)
Sodium: 133 mEq/L — ABNORMAL LOW (ref 135–145)
Total Bilirubin: 0.3 mg/dL (ref 0.2–1.2)
Total Protein: 6.9 g/dL (ref 6.0–8.3)

## 2019-05-26 LAB — CBC WITH DIFFERENTIAL/PLATELET
Basophils Absolute: 0.1 10*3/uL (ref 0.0–0.1)
Basophils Relative: 0.7 % (ref 0.0–3.0)
Eosinophils Absolute: 0.2 10*3/uL (ref 0.0–0.7)
Eosinophils Relative: 2.7 % (ref 0.0–5.0)
HCT: 38.3 % (ref 36.0–46.0)
Hemoglobin: 12.5 g/dL (ref 12.0–15.0)
Lymphocytes Relative: 21.8 % (ref 12.0–46.0)
Lymphs Abs: 2 10*3/uL (ref 0.7–4.0)
MCHC: 32.7 g/dL (ref 30.0–36.0)
MCV: 87.1 fl (ref 78.0–100.0)
Monocytes Absolute: 0.7 10*3/uL (ref 0.1–1.0)
Monocytes Relative: 8.2 % (ref 3.0–12.0)
Neutro Abs: 6.1 10*3/uL (ref 1.4–7.7)
Neutrophils Relative %: 66.6 % (ref 43.0–77.0)
Platelets: 341 10*3/uL (ref 150.0–400.0)
RBC: 4.4 Mil/uL (ref 3.87–5.11)
RDW: 14 % (ref 11.5–15.5)
WBC: 9.1 10*3/uL (ref 4.0–10.5)

## 2019-05-26 LAB — LIPID PANEL
Cholesterol: 219 mg/dL — ABNORMAL HIGH (ref 0–200)
HDL: 65.6 mg/dL (ref >39.00)
LDL Cholesterol: 116 mg/dL — ABNORMAL HIGH (ref 0–99)
NonHDL: 153.19
Total CHOL/HDL Ratio: 3
Triglycerides: 184 mg/dL — ABNORMAL HIGH (ref 0.0–149.0)
VLDL: 36.8 mg/dL (ref 0.0–40.0)

## 2019-05-26 LAB — VITAMIN B12: Vitamin B-12: 343 pg/mL (ref 211–911)

## 2019-05-26 LAB — TSH: TSH: 2.67 u[IU]/mL (ref 0.35–4.50)

## 2019-05-26 LAB — HEMOGLOBIN A1C: Hgb A1c MFr Bld: 8.5 % — ABNORMAL HIGH (ref 4.6–6.5)

## 2019-05-31 ENCOUNTER — Other Ambulatory Visit: Payer: Self-pay

## 2019-05-31 ENCOUNTER — Inpatient Hospital Stay: Payer: Medicare HMO

## 2019-05-31 ENCOUNTER — Inpatient Hospital Stay (HOSPITAL_COMMUNITY)
Admit: 2019-05-31 | Discharge: 2019-05-31 | Disposition: A | Discharge status: Home / Self Care | Payer: Medicare HMO | Attending: Nurse Practitioner | Admitting: Nurse Practitioner

## 2019-05-31 ENCOUNTER — Encounter: Payer: Self-pay | Admitting: Emergency Medicine

## 2019-05-31 ENCOUNTER — Ambulatory Visit: Payer: Medicare HMO | Admitting: Urology

## 2019-05-31 ENCOUNTER — Inpatient Hospital Stay
Admission: EM | Admit: 2019-05-31 | Discharge: 2019-06-02 | DRG: 309 | Disposition: A | Discharge status: Home Health | Payer: Medicare HMO | Attending: Internal Medicine | Admitting: Internal Medicine

## 2019-05-31 ENCOUNTER — Encounter

## 2019-05-31 DIAGNOSIS — Z8051 Family history of malignant neoplasm of kidney: Secondary | ICD-10-CM

## 2019-05-31 DIAGNOSIS — Z8542 Personal history of malignant neoplasm of other parts of uterus: Secondary | ICD-10-CM | POA: Diagnosis not present

## 2019-05-31 DIAGNOSIS — R296 Repeated falls: Secondary | ICD-10-CM | POA: Diagnosis present

## 2019-05-31 DIAGNOSIS — Y93E1 Activity, personal bathing and showering: Secondary | ICD-10-CM | POA: Diagnosis not present

## 2019-05-31 DIAGNOSIS — R079 Chest pain, unspecified: Secondary | ICD-10-CM | POA: Diagnosis not present

## 2019-05-31 DIAGNOSIS — Z9181 History of falling: Secondary | ICD-10-CM

## 2019-05-31 DIAGNOSIS — I4891 Unspecified atrial fibrillation: Secondary | ICD-10-CM | POA: Diagnosis not present

## 2019-05-31 DIAGNOSIS — Z8711 Personal history of peptic ulcer disease: Secondary | ICD-10-CM

## 2019-05-31 DIAGNOSIS — Z20828 Contact with and (suspected) exposure to other viral communicable diseases: Secondary | ICD-10-CM | POA: Diagnosis present

## 2019-05-31 DIAGNOSIS — E785 Hyperlipidemia, unspecified: Secondary | ICD-10-CM | POA: Diagnosis present

## 2019-05-31 DIAGNOSIS — F419 Anxiety disorder, unspecified: Secondary | ICD-10-CM | POA: Diagnosis present

## 2019-05-31 DIAGNOSIS — Z90710 Acquired absence of both cervix and uterus: Secondary | ICD-10-CM

## 2019-05-31 DIAGNOSIS — K219 Gastro-esophageal reflux disease without esophagitis: Secondary | ICD-10-CM | POA: Diagnosis present

## 2019-05-31 DIAGNOSIS — W010XXA Fall on same level from slipping, tripping and stumbling without subsequent striking against object, initial encounter: Secondary | ICD-10-CM | POA: Diagnosis present

## 2019-05-31 DIAGNOSIS — I340 Nonrheumatic mitral (valve) insufficiency: Secondary | ICD-10-CM | POA: Diagnosis not present

## 2019-05-31 DIAGNOSIS — Z79899 Other long term (current) drug therapy: Secondary | ICD-10-CM | POA: Diagnosis not present

## 2019-05-31 DIAGNOSIS — R55 Syncope and collapse: Secondary | ICD-10-CM | POA: Diagnosis present

## 2019-05-31 DIAGNOSIS — I471 Supraventricular tachycardia: Secondary | ICD-10-CM | POA: Diagnosis not present

## 2019-05-31 DIAGNOSIS — M8580 Other specified disorders of bone density and structure, unspecified site: Secondary | ICD-10-CM | POA: Diagnosis present

## 2019-05-31 DIAGNOSIS — W19XXXA Unspecified fall, initial encounter: Secondary | ICD-10-CM

## 2019-05-31 DIAGNOSIS — R8290 Unspecified abnormal findings in urine: Secondary | ICD-10-CM | POA: Diagnosis present

## 2019-05-31 DIAGNOSIS — Z8541 Personal history of malignant neoplasm of cervix uteri: Secondary | ICD-10-CM

## 2019-05-31 DIAGNOSIS — Z7982 Long term (current) use of aspirin: Secondary | ICD-10-CM | POA: Diagnosis not present

## 2019-05-31 DIAGNOSIS — Z8249 Family history of ischemic heart disease and other diseases of the circulatory system: Secondary | ICD-10-CM

## 2019-05-31 DIAGNOSIS — R69 Illness, unspecified: Secondary | ICD-10-CM | POA: Diagnosis not present

## 2019-05-31 DIAGNOSIS — Z803 Family history of malignant neoplasm of breast: Secondary | ICD-10-CM

## 2019-05-31 DIAGNOSIS — S299XXA Unspecified injury of thorax, initial encounter: Secondary | ICD-10-CM | POA: Diagnosis not present

## 2019-05-31 DIAGNOSIS — I10 Essential (primary) hypertension: Secondary | ICD-10-CM | POA: Diagnosis not present

## 2019-05-31 DIAGNOSIS — E1169 Type 2 diabetes mellitus with other specified complication: Secondary | ICD-10-CM | POA: Diagnosis present

## 2019-05-31 DIAGNOSIS — R000 Tachycardia, unspecified: Secondary | ICD-10-CM | POA: Diagnosis not present

## 2019-05-31 DIAGNOSIS — Z801 Family history of malignant neoplasm of trachea, bronchus and lung: Secondary | ICD-10-CM

## 2019-05-31 DIAGNOSIS — S0990XA Unspecified injury of head, initial encounter: Secondary | ICD-10-CM | POA: Diagnosis not present

## 2019-05-31 DIAGNOSIS — E1165 Type 2 diabetes mellitus with hyperglycemia: Secondary | ICD-10-CM | POA: Diagnosis present

## 2019-05-31 DIAGNOSIS — R778 Other specified abnormalities of plasma proteins: Secondary | ICD-10-CM

## 2019-05-31 DIAGNOSIS — I248 Other forms of acute ischemic heart disease: Secondary | ICD-10-CM | POA: Diagnosis present

## 2019-05-31 DIAGNOSIS — Z888 Allergy status to other drugs, medicaments and biological substances status: Secondary | ICD-10-CM | POA: Diagnosis not present

## 2019-05-31 DIAGNOSIS — E119 Type 2 diabetes mellitus without complications: Secondary | ICD-10-CM | POA: Diagnosis not present

## 2019-05-31 DIAGNOSIS — F329 Major depressive disorder, single episode, unspecified: Secondary | ICD-10-CM | POA: Diagnosis present

## 2019-05-31 DIAGNOSIS — R7989 Other specified abnormal findings of blood chemistry: Secondary | ICD-10-CM | POA: Diagnosis present

## 2019-05-31 DIAGNOSIS — IMO0002 Reserved for concepts with insufficient information to code with codable children: Secondary | ICD-10-CM | POA: Diagnosis present

## 2019-05-31 DIAGNOSIS — Z853 Personal history of malignant neoplasm of breast: Secondary | ICD-10-CM | POA: Diagnosis not present

## 2019-05-31 DIAGNOSIS — Z7984 Long term (current) use of oral hypoglycemic drugs: Secondary | ICD-10-CM

## 2019-05-31 DIAGNOSIS — R51 Headache: Secondary | ICD-10-CM | POA: Diagnosis not present

## 2019-05-31 DIAGNOSIS — Z87891 Personal history of nicotine dependence: Secondary | ICD-10-CM | POA: Diagnosis not present

## 2019-05-31 DIAGNOSIS — Z03818 Encounter for observation for suspected exposure to other biological agents ruled out: Secondary | ICD-10-CM | POA: Diagnosis not present

## 2019-05-31 LAB — URINALYSIS, COMPLETE (UACMP) WITH MICROSCOPIC
Bacteria, UA: NONE SEEN
Bilirubin Urine: NEGATIVE
Glucose, UA: 50 mg/dL — AB
Ketones, ur: 5 mg/dL — AB
Nitrite: NEGATIVE
Protein, ur: NEGATIVE mg/dL
Specific Gravity, Urine: 1.006 (ref 1.005–1.030)
WBC, UA: >50 WBC/hpf — ABNORMAL HIGH (ref 0–5)
pH: 7 (ref 5.0–8.0)

## 2019-05-31 LAB — CBC
HCT: 36.6 % (ref 36.0–46.0)
Hemoglobin: 11.7 g/dL — ABNORMAL LOW (ref 12.0–15.0)
MCH: 28.3 pg (ref 26.0–34.0)
MCHC: 32 g/dL (ref 30.0–36.0)
MCV: 88.4 fL (ref 80.0–100.0)
Platelets: 277 10*3/uL (ref 150–400)
RBC: 4.14 MIL/uL (ref 3.87–5.11)
RDW: 13 % (ref 11.5–15.5)
WBC: 8.4 10*3/uL (ref 4.0–10.5)
nRBC: 0 % (ref 0.0–0.2)

## 2019-05-31 LAB — TROPONIN I (HIGH SENSITIVITY)
Troponin I (High Sensitivity): 16 ng/L (ref <18)
Troponin I (High Sensitivity): 73 ng/L — ABNORMAL HIGH (ref <18)
Troponin I (High Sensitivity): 129 ng/L (ref <18)
Troponin I (High Sensitivity): 127 ng/L (ref <18)

## 2019-05-31 LAB — COMPREHENSIVE METABOLIC PANEL
ALT: 16 U/L (ref 0–44)
AST: 16 U/L (ref 15–41)
Albumin: 3.5 g/dL (ref 3.5–5.0)
Alkaline Phosphatase: 50 U/L (ref 38–126)
Anion gap: 7 (ref 5–15)
BUN: 9 mg/dL (ref 8–23)
CO2: 27 mmol/L (ref 22–32)
Calcium: 9.2 mg/dL (ref 8.9–10.3)
Chloride: 100 mmol/L (ref 98–111)
Creatinine, Ser: 0.57 mg/dL (ref 0.44–1.00)
GFR calc Af Amer: >60 mL/min (ref >60)
GFR calc non Af Amer: >60 mL/min (ref >60)
Glucose, Bld: 268 mg/dL — ABNORMAL HIGH (ref 70–99)
Potassium: 4.3 mmol/L (ref 3.5–5.1)
Sodium: 134 mmol/L — ABNORMAL LOW (ref 135–145)
Total Bilirubin: 0.5 mg/dL (ref 0.3–1.2)
Total Protein: 6.2 g/dL — ABNORMAL LOW (ref 6.5–8.1)

## 2019-05-31 LAB — GLUCOSE, CAPILLARY: Glucose-Capillary: 151 mg/dL — ABNORMAL HIGH (ref 70–99)

## 2019-05-31 MED ORDER — NYSTATIN-TRIAMCINOLONE 100000-0.1 UNIT/GM-% EX OINT: 1.0000 "application " | TOPICAL_OINTMENT | Freq: Two times a day (BID) | CUTANEOUS | Status: DC

## 2019-05-31 MED ORDER — VITAMIN B-12 1000 MCG PO TABS
1000.0000 ug | ORAL_TABLET | Freq: Every day | ORAL | Status: DC
  Administered 2019-05-31 – 2019-06-02 (×3): 1000 ug via ORAL
  Filled 2019-05-31 (×3): qty 1

## 2019-05-31 MED ORDER — SODIUM CHLORIDE 0.9 % IV BOLUS
500.0000 mL | Freq: Once | INTRAVENOUS | Status: AC
  Administered 2019-05-31: 500 mL via INTRAVENOUS

## 2019-05-31 MED ORDER — ASPIRIN EC 81 MG PO TBEC
81.0000 mg | DELAYED_RELEASE_TABLET | Freq: Every day | ORAL | Status: DC
  Administered 2019-06-01 – 2019-06-02 (×2): 81 mg via ORAL
  Filled 2019-05-31 (×2): qty 1

## 2019-05-31 MED ORDER — FAMOTIDINE 20 MG PO TABS
40.0000 mg | ORAL_TABLET | Freq: Every day | ORAL | Status: DC
  Administered 2019-06-01 – 2019-06-02 (×2): 40 mg via ORAL
  Filled 2019-05-31 (×2): qty 2

## 2019-05-31 MED ORDER — NYSTATIN 100000 UNIT/GM EX POWD: Freq: Two times a day (BID) | CUTANEOUS | Status: DC

## 2019-05-31 MED ORDER — VITAMIN D3 25 MCG (1000 UNIT) PO TABS
2000.0000 [IU] | ORAL_TABLET | Freq: Every day | ORAL | Status: DC
  Administered 2019-05-31 – 2019-06-02 (×3): 2000 [IU] via ORAL
  Filled 2019-05-31 (×6): qty 2

## 2019-05-31 MED ORDER — CITALOPRAM HYDROBROMIDE 20 MG PO TABS
40.0000 mg | ORAL_TABLET | Freq: Every day | ORAL | Status: DC
  Administered 2019-06-01: 09:00:00 40 mg via ORAL
  Filled 2019-05-31 (×2): qty 2

## 2019-05-31 MED ORDER — ESTRADIOL 0.1 MG/GM VA CREA
1.0000 | TOPICAL_CREAM | VAGINAL | Status: DC
  Filled 2019-05-31: qty 42.5

## 2019-05-31 MED ORDER — METOPROLOL SUCCINATE ER 50 MG PO TB24
50.0000 mg | ORAL_TABLET | Freq: Every day | ORAL | Status: DC
  Administered 2019-05-31 – 2019-06-02 (×3): 50 mg via ORAL
  Filled 2019-05-31 (×3): qty 1

## 2019-05-31 MED ORDER — CLOTRIMAZOLE 1 % VA CREA
1.0000 | TOPICAL_CREAM | Freq: Every day | VAGINAL | Status: DC
  Administered 2019-05-31: 1 via VAGINAL
  Filled 2019-05-31: qty 45

## 2019-05-31 MED ORDER — ESTROGENS, CONJUGATED 0.625 MG/GM VA CREA
1.0000 | TOPICAL_CREAM | VAGINAL | Status: DC
  Filled 2019-05-31: qty 30

## 2019-05-31 MED ORDER — SIMVASTATIN 20 MG PO TABS
40.0000 mg | ORAL_TABLET | Freq: Every day | ORAL | Status: DC
  Administered 2019-06-01 (×2): 40 mg via ORAL
  Filled 2019-05-31 (×2): qty 2

## 2019-05-31 MED ORDER — ACETAMINOPHEN 500 MG PO TABS
500.0000 mg | ORAL_TABLET | Freq: Four times a day (QID) | ORAL | Status: DC | PRN
  Administered 2019-06-01: 500 mg via ORAL
  Filled 2019-05-31: qty 1

## 2019-05-31 MED ORDER — ENOXAPARIN SODIUM 40 MG/0.4ML ~~LOC~~ SOLN
40.0000 mg | SUBCUTANEOUS | Status: DC
  Administered 2019-05-31: 40 mg via SUBCUTANEOUS
  Filled 2019-05-31: qty 0.4

## 2019-05-31 MED ORDER — LISINOPRIL 20 MG PO TABS
40.0000 mg | ORAL_TABLET | Freq: Every day | ORAL | Status: DC
  Administered 2019-05-31 – 2019-06-02 (×3): 40 mg via ORAL
  Filled 2019-05-31 (×4): qty 2

## 2019-05-31 MED ORDER — CLOTRIMAZOLE 1 % EX CREA: TOPICAL_CREAM | Freq: Two times a day (BID) | CUTANEOUS | Status: DC

## 2019-05-31 NOTE — ED Notes (Signed)
Report to [facility], patient to floor with tech.

## 2019-05-31 NOTE — Progress Notes (Signed)
*  PRELIMINARY RESULTS* Echocardiogram 2D Echocardiogram has been performed.  Laura Bell 05/31/2019, 8:11 PM

## 2019-05-31 NOTE — ED Notes (Signed)
Patient complains of urgency and burning with urination, UA sent.

## 2019-05-31 NOTE — ED Notes (Signed)
Spoke with daughter in law on phone, up to commode in room, gait slow but able to walk on own.

## 2019-05-31 NOTE — ED Triage Notes (Signed)
States was getting out of shower and fell. States she does not know why she fell but denies chest pain or dizziness prior to fall. Does not remember tripping. Denies chest pain or SOB now. Monitor Sinus tach 117.
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
## 2019-05-31 NOTE — Plan of Care (Signed)

## 2019-05-31 NOTE — ED Notes (Signed)
Up to [facility], on return to bed noted HR 140, EKG done, denies weakness or chest pain, returns to rate 100 after rest in bed.

## 2019-05-31 NOTE — Progress Notes (Signed)
Family Meeting Note  Advance Directive:no  Today a meeting took place with the Patient and daughter.  The following clinical team members were present during this meeting:MD  The following were discussed:Patient's diagnosis: SVT, Fall, DM, Htn , Patient's progosis: Unable to determine and Goals for treatment: Full Code  Additional follow-up to be provided: PMD  Time spent during discussion:20 minutes  Vaughan Basta, MD

## 2019-05-31 NOTE — ED Notes (Signed)
Daughter here to visit.
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
## 2019-06-01 DIAGNOSIS — W19XXXA Unspecified fall, initial encounter: Secondary | ICD-10-CM

## 2019-06-01 DIAGNOSIS — I10 Essential (primary) hypertension: Secondary | ICD-10-CM

## 2019-06-01 DIAGNOSIS — R7989 Other specified abnormal findings of blood chemistry: Secondary | ICD-10-CM

## 2019-06-01 DIAGNOSIS — R000 Tachycardia, unspecified: Secondary | ICD-10-CM

## 2019-06-01 LAB — ECHOCARDIOGRAM COMPLETE
Height: 62 in
Weight: 2267.2 oz

## 2019-06-01 LAB — SARS CORONAVIRUS 2 (TAT 6-24 HRS): SARS Coronavirus 2: NEGATIVE

## 2019-06-01 LAB — TROPONIN I (HIGH SENSITIVITY)
Troponin I (High Sensitivity): 104 ng/L (ref <18)
Troponin I (High Sensitivity): 92 ng/L — ABNORMAL HIGH (ref <18)

## 2019-06-01 LAB — HEPARIN LEVEL (UNFRACTIONATED): Heparin Unfractionated: 0.38 IU/mL (ref 0.30–0.70)

## 2019-06-01 LAB — GLUCOSE, CAPILLARY
Glucose-Capillary: 181 mg/dL — ABNORMAL HIGH (ref 70–99)
Glucose-Capillary: 131 mg/dL — ABNORMAL HIGH (ref 70–99)
Glucose-Capillary: 163 mg/dL — ABNORMAL HIGH (ref 70–99)
Glucose-Capillary: 212 mg/dL — ABNORMAL HIGH (ref 70–99)

## 2019-06-01 LAB — PROTIME-INR
INR: 1.1 (ref 0.8–1.2)
Prothrombin Time: 13.7 seconds (ref 11.4–15.2)

## 2019-06-01 LAB — APTT: aPTT: 29 seconds (ref 24–36)

## 2019-06-01 MED ORDER — HEPARIN BOLUS VIA INFUSION
2000.0000 [IU] | Freq: Once | INTRAVENOUS | Status: AC
  Administered 2019-06-01: 2000 [IU] via INTRAVENOUS
  Filled 2019-06-01: qty 2000

## 2019-06-01 MED ORDER — HEPARIN (PORCINE) 25000 UT/250ML-% IV SOLN
750.0000 [IU]/h | INTRAVENOUS | Status: DC
  Administered 2019-06-01: 750 [IU]/h via INTRAVENOUS
  Filled 2019-06-01: qty 250

## 2019-06-01 MED ORDER — ENOXAPARIN SODIUM 40 MG/0.4ML ~~LOC~~ SOLN
40.0000 mg | SUBCUTANEOUS | Status: DC
  Administered 2019-06-01: 40 mg via SUBCUTANEOUS
  Filled 2019-06-01: qty 0.4

## 2019-06-01 MED ORDER — INSULIN ASPART 100 UNIT/ML ~~LOC~~ SOLN
0.0000 [IU] | Freq: Three times a day (TID) | SUBCUTANEOUS | Status: DC
  Administered 2019-06-01: 1 [IU] via SUBCUTANEOUS
  Administered 2019-06-01 (×2): 2 [IU] via SUBCUTANEOUS
  Administered 2019-06-02 (×2): 3 [IU] via SUBCUTANEOUS
  Filled 2019-06-01 (×5): qty 1

## 2019-06-01 MED ORDER — INSULIN ASPART 100 UNIT/ML ~~LOC~~ SOLN
0.0000 [IU] | Freq: Every day | SUBCUTANEOUS | Status: DC
  Administered 2019-06-01: 2 [IU] via SUBCUTANEOUS
  Filled 2019-06-01: qty 1
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
## 2019-06-01 NOTE — Plan of Care (Signed)

## 2019-06-01 NOTE — Progress Notes (Signed)
Heparin bolus given and drip initiated as ordered due to elevated troponin; no complaints of CP. Patient denies pain other than "soreness"; was offered Tylenol but she has declined.

## 2019-06-01 NOTE — Progress Notes (Signed)
Dr Sidney Ace notified of patient not having CBG monitoring & SSI coverage as type 2 diabetic. New orders received for sensitive coverage ACHS & CBG monitoring.

## 2019-06-01 NOTE — Progress Notes (Signed)
Due to rising troponin started on heparine drip and called cardio consult.
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
## 2019-06-01 NOTE — Progress Notes (Signed)
[facility] NAME: Laura Bell    MR#:  [id number]  DATE OF BIRTH:  09/15/1936  SUBJECTIVE: Patient is admitted for syncope, patient has been having falls 3 times this month.  Denies any chest pain, patient noted to have SVT.  CHIEF COMPLAINT:   Chief Complaint  Patient presents with  . Fall   Feels fine now, because of fall yesterday having joint pains but nothing new. REVIEW OF SYSTEMS:   ROS CONSTITUTIONAL: No fever, fatigue or weakness.  EYES: No blurred or double vision.  EARS, NOSE, AND THROAT: No tinnitus or ear pain.  RESPIRATORY: No cough, shortness of breath, wheezing or hemoptysis.  CARDIOVASCULAR: No chest pain, orthopnea, edema.  GASTROINTESTINAL: No nausea, vomiting, diarrhea or abdominal pain.  GENITOURINARY: No dysuria, hematuria.  ENDOCRINE: No polyuria, nocturia,  HEMATOLOGY: No anemia, easy bruising or bleeding SKIN: No rash or lesion. MUSCULOSKELETAL: No joint pain or arthritis.   NEUROLOGIC: No tingling, numbness, weakness.  PSYCHIATRY: No anxiety or depression.   DRUG ALLERGIES:   Allergies  Allergen Reactions  . Actos [Pioglitazone]     Fatigue/pedal edema/exercise intol and palpitations  . Alendronate Sodium     GI side eff  . Boniva [Ibandronate Sodium]     GI side eff  . Ibandronic Acid Hives    GI side eff  . Lansoprazole   . Omeprazole Itching    ? Itching     VITALS:  Blood pressure 118/61, pulse 84, temperature 97.8 F (36.6 C), temperature source Oral, resp. rate 16, height 5\' 2"  (1.575 m), weight 64.3 kg, SpO2 96 %.  PHYSICAL EXAMINATION:  GENERAL:  83 y.o.-year-old patient lying in the bed with no acute distress.  EYES: Pupils equal, round, reactive to light and accommodation. No scleral icterus. Extraocular muscles intact.  HEENT: Head atraumatic, normocephalic. Oropharynx and nasopharynx clear.  NECK:  Supple, no jugular venous distention. No thyroid enlargement,  no tenderness.  LUNGS: Normal breath sounds bilaterally, no wheezing, rales,rhonchi or crepitation. No use of accessory muscles of respiration.  CARDIOVASCULAR: S1, S2 normal. No murmurs, rubs, or gallops.  ABDOMEN: Soft, nontender, nondistended. Bowel sounds present. No organomegaly or mass.  EXTREMITIES: No pedal edema, cyanosis, or clubbing.  NEUROLOGIC: Cranial nerves II through XII are intact. Muscle strength 5/5 in all extremities. Sensation intact. Gait not checked.  PSYCHIATRIC: The patient is alert and oriented x 3.  SKIN: No obvious rash, lesion, or ulcer.    LABORATORY PANEL:   CBC Recent Labs  Lab 05/31/19 0905  WBC 8.4  HGB 11.7*  HCT 36.6  PLT 277   ------------------------------------------------------------------------------------------------------------------  Chemistries  Recent Labs  Lab 05/31/19 0905  NA 134*  K 4.3  CL 100  CO2 27  GLUCOSE 268*  BUN 9  CREATININE 0.57  CALCIUM 9.2  AST 16  ALT 16  ALKPHOS 50  BILITOT 0.5   ------------------------------------------------------------------------------------------------------------------  Cardiac Enzymes No results for input(s): TROPONINI in the last 168 hours. ------------------------------------------------------------------------------------------------------------------  RADIOLOGY:  X-ray Chest Pa And Lateral  Result Date: 05/31/2019 CLINICAL DATA:  Pain after fall EXAM: CHEST - 2 VIEW COMPARISON:  None. FINDINGS: The heart size and mediastinal contours are within normal limits. Both lungs are clear. The visualized skeletal structures are unremarkable. IMPRESSION: No active cardiopulmonary disease. Electronically Signed   By: Dorise Bullion III M.D   On: 05/31/2019 14:27   Ct Head Wo Contrast  Result Date: 05/31/2019 CLINICAL DATA:  Pain after fall. EXAM: CT  HEAD WITHOUT CONTRAST TECHNIQUE: Contiguous axial images were obtained from the base of the skull through the vertex without  intravenous contrast. COMPARISON:  None. FINDINGS: Brain: No subdural, epidural, or subarachnoid hemorrhage. Ventricles and sulci are mildly prominent but otherwise normal. Cerebellum, brainstem, and basal cisterns are normal. No mass effect or midline shift. No acute cortical ischemia or infarct. Mild white matter changes. Vascular: Calcified atherosclerosis is seen in the intracranial carotids. Skull: Normal. Negative for fracture or focal lesion. Sinuses/Orbits: No acute finding. Other: None. IMPRESSION: 1. No acute intracranial abnormalities identified. Electronically Signed   By: Dorise Bullion III M.D   On: 05/31/2019 14:26    EKG:   Orders placed or performed during the hospital encounter of 05/31/19  . EKG 12-Lead  . EKG 12-Lead    ASSESSMENT AND PLAN:   Fall with loss of consciousness, echocardiogram pending, patient troponin up to 1.9 likely due to fall, cardiology recommends aspirin only, discontinued heparin, ] possible discharge tomorrow with ZIO if arrhythmia not seen on telemetry as per cardiology. 2.  Sinus tachycardia, likely due to fall, CT head, chest x-ray unremarkable.   #3/essential hypertension, patient on metoprolol,, lisinopril Possible discharge tomorrow, monitor on telemetry for monitoring any arrhythmias, possible discharge tomorrow with ZIO monitor All the records are reviewed and case discussed with Care Management/Social Workerr. Management plans discussed with the patient, family and they are in agreement.  CODE STATUS: Full code  TOTAL TIME TAKING CARE OF THIS PATIENT: 29minutes.  More than 50% time spent in counseling, coordination of care POSSIBLE D/C IN 1DAYS, DEPENDING ON CLINICAL CONDITION.   Epifanio Lesches M.D on 06/01/2019 at 2:13 PM  Between 7am to 6pm - Pager - [phone]  After 6pm go to [email or web address] - password [email or web address] [facility]  [facility]  Office  [phone]  CC: Primary care physician; Tower, Wynelle Fanny, MD   Note:  This dictation was prepared with Dragon dictation along with smaller phrase technology. Any transcriptional errors that result from this process are unintentional.

## 2019-06-02 LAB — GLUCOSE, CAPILLARY
Glucose-Capillary: 200 mg/dL — ABNORMAL HIGH (ref 70–99)
Glucose-Capillary: 245 mg/dL — ABNORMAL HIGH (ref 70–99)
Glucose-Capillary: 212 mg/dL — ABNORMAL HIGH (ref 70–99)

## 2019-06-02 LAB — CBC
HCT: 39.7 % (ref 36.0–46.0)
Hemoglobin: 12.7 g/dL (ref 12.0–15.0)
MCH: 27.9 pg (ref 26.0–34.0)
MCHC: 32 g/dL (ref 30.0–36.0)
MCV: 87.1 fL (ref 80.0–100.0)
Platelets: 294 10*3/uL (ref 150–400)
RBC: 4.56 MIL/uL (ref 3.87–5.11)
RDW: 13.1 % (ref 11.5–15.5)
WBC: 10.5 10*3/uL (ref 4.0–10.5)
nRBC: 0 % (ref 0.0–0.2)

## 2019-06-02 MED ORDER — CITALOPRAM HYDROBROMIDE 20 MG PO TABS: 20.0000 mg | ORAL_TABLET | Freq: Every day | ORAL | 2 refills | Status: DC

## 2019-06-02 NOTE — Plan of Care (Signed)
  Problem: Clinical Measurements: Goal: Ability to maintain clinical measurements within normal limits will improve Outcome: Adequate for Discharge   Problem: Clinical Measurements: Goal: Cardiovascular complication will be avoided Outcome: Adequate for Discharge   Problem: Activity: Goal: Risk for activity intolerance will decrease Outcome: Adequate for Discharge   Problem: Safety: Goal: Ability to remain free from injury will improve Outcome: Adequate for Discharge   

## 2019-06-02 NOTE — Progress Notes (Addendum)
Seen by physical therapy, they recommend home health physical therapy.  With rolling walker.  Discharge her home with home health services.  I spoke with patient's daughter this morning.  Patient can follow with PCP in 1 week P can follow-up with Dr. Esmond Plants in 1 week as patient EF is around 40 to 45%. Discussed echo results with patient daughter Altha Harm.  [phone] And continue aspirin, statins, beta-blockers, lisinopril and also her diabetic medications at discharge and follow-up with;  TimGollan in 1 week. Decrease the dose of Celexa secondary to due to prolongation, patient not started on Celexa(Looks like she just got new script) as per her home med list.
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
## 2019-06-02 NOTE — Evaluation (Signed)
Physical Therapy Evaluation Patient Details Name: Laura Bell MRN: [id number] DOB: 10-Mar-1936 Today's Date: 06/02/2019   History of Present Illness  pt is an 83 yo female who presented to ED after falling outside her tub, unkown cause of fall and no injuries resulting from fall, pt able to scoot herself to call for help. PMH: arthritis, breast and cervical cancer, DM, HTN, OA, osteopenia  Clinical Impression  Pt is an 83 yo female admitted for above. Pt in bed upon arrival and agreed to participate with PT. Pt presents with decreased strength, endurance and balance with reports of frequent falls over the last three months. Pt performed functional mobility tasks with min guard assist and RW. Pt has increased reliance on UE support from RW in standing, ambulation and balance challenges with narrow base of support. Pt educated on performing ther ex throughout the day in order to strengthen LEs with pt verbalizing understanding and performing exercises correctly with min cuing throughout session. Pt educated on having nightlights in bathroom to increase visibility and fall prevention when she gets up during the night. Pt would benefit from skilled acute therapy to improve deficits and home health PT following discharge to improve strength and balance and incorporate fall prevention strategies in the home. Pt educated on home health therapy and agreed stating that would be a great idea.     Follow Up Recommendations Home health PT    Equipment Recommendations  Rolling walker with 5" wheels(tub bench)    Recommendations for Other Services       Precautions / Restrictions Precautions Precautions: Fall Restrictions Weight Bearing Restrictions: No      Mobility  Bed Mobility Overal bed mobility: Needs Assistance Bed Mobility: Supine to Sit     Supine to sit: Min guard;HOB elevated     General bed mobility comments: pt utilized bed rails to sit EOB, no physical assist  required  Transfers Overall transfer level: Needs assistance Equipment used: Rolling walker (2 wheeled) Transfers: Sit to/from Stand Sit to Stand: Min guard         General transfer comment: min guard to stand, pt requiring cuing for hand placement  Ambulation/Gait Ambulation/Gait assistance: Min guard Gait Distance (Feet): 75 Feet Assistive device: Rolling walker (2 wheeled) Gait Pattern/deviations: Step-through pattern;Decreased stride length;Trunk flexed Gait velocity: decreased   General Gait Details: pt ambulates with slow cautious gait pattern, reliant on UE support from RW, no instance of unsteadiness or LOB  Stairs            Wheelchair Mobility    Modified Rankin (Stroke Patients Only)       Balance Overall balance assessment: Needs assistance;History of Falls Sitting-balance support: Feet supported Sitting balance-Leahy Scale: Fair     Standing balance support: Bilateral upper extremity supported;During functional activity Standing balance-Leahy Scale: Fair Standing balance comment: pt able to maintain standing balance with feet together and no UE support ~ 20 sec, semi tandem stance without UE support ~6-10 sec, increased sway noted in both positions when UE support taken away                             Pertinent Vitals/Pain Pain Assessment: No/denies pain    Home Living Family/patient expects to be discharged to:: Assisted living(2nd floor apartment but has elevator) Living Arrangements: Alone             Home Equipment: Walker - 4 wheels;Grab bars - toilet;Grab bars - tub/shower  Prior Function Level of Independence: Independent         Comments: pt reports being independent with all ADLs/IADLs, pt reports normally walking in the am and pm on different floors of assisted living using handrail but last month hasnt been able to walk, daughter takes her to doctors appointments, pt reports 3 falls in last few months, one  being in the bathtub at her daughters house, one cleaning her walk in closet and the third this fall by the tub, pt reports she will have daughter staying with her 24/7 to assist her for about a week or so after discharge until the family can find someone to help a day or two a week for household chores, pt reports never getting up during the night without using rollator     Hand Dominance        Extremity/Trunk Assessment   Upper Extremity Assessment Upper Extremity Assessment: Overall WFL for tasks assessed    Lower Extremity Assessment Lower Extremity Assessment: Generalized weakness    Cervical / Trunk Assessment Cervical / Trunk Assessment: Kyphotic  Communication   Communication: No difficulties  Cognition Arousal/Alertness: Awake/alert Behavior During Therapy: WFL for tasks assessed/performed Overall Cognitive Status: Within Functional Limits for tasks assessed                                        General Comments      Exercises Total Joint Exercises Towel Squeeze: AROM;Strengthening;Both;10 reps Hip ABduction/ADduction: AROM;Both;10 reps;Seated(isometric hip ABD against therapist resistance) Long Arc Quad: AROM;Both;20 reps;Strengthening Marching in Standing: AROM;Both;10 reps General Exercises - Lower Extremity Toe Raises: AROM;Both;10 reps Heel Raises: AROM;Both;10 reps Other Exercises Other Exercises: 5x STS in 15 sec with heavy reliance on UE support from RW   Assessment/Plan    PT Assessment Patient needs continued PT services  PT Problem List Decreased strength;Decreased mobility;Decreased safety awareness;Decreased range of motion;Decreased activity tolerance;Decreased balance;Decreased knowledge of use of DME;Cardiopulmonary status limiting activity       PT Treatment Interventions DME instruction;Therapeutic exercise;Gait training;Stair training;Functional mobility training;Therapeutic activities;Patient/family  education;Neuromuscular re-education;Balance training    PT Goals (Current goals can be found in the Care Plan section)  Acute Rehab PT Goals Patient Stated Goal: return home PT Goal Formulation: With patient Time For Goal Achievement: 06/09/19 Potential to Achieve Goals: Good    Frequency Min 2X/week   Barriers to discharge Decreased caregiver support      Co-evaluation               AM-PAC PT "6 Clicks" Mobility  Outcome Measure Help needed turning from your back to your side while in a flat bed without using bedrails?: A Little Help needed moving from lying on your back to sitting on the side of a flat bed without using bedrails?: A Little Help needed moving to and from a bed to a chair (including a wheelchair)?: A Little Help needed standing up from a chair using your arms (e.g., wheelchair or bedside chair)?: A Little Help needed to walk in hospital room?: A Little Help needed climbing 3-5 steps with a railing? : A Little 6 Click Score: 18    End of Session Equipment Utilized During Treatment: Gait belt Activity Tolerance: Patient tolerated treatment well Patient left: in chair;with call bell/phone within reach;with chair alarm set Nurse Communication: Mobility status PT Visit Diagnosis: Unsteadiness on feet (R26.81);Other abnormalities of gait and mobility (R26.89);Muscle weakness (generalized) (  M62.81)    [id number] PT Time Calculation (min) (ACUTE ONLY): 39 min   Charges:   PT Evaluation $PT Eval Moderate Complexity: 1 Mod PT Treatments $Therapeutic Exercise: 8-22 mins        Cieara Stierwalt PT, DPT 12:39 PM,06/02/19 [phone]

## 2019-06-02 NOTE — Plan of Care (Signed)
  Problem: Activity: Goal: Risk for activity intolerance will decrease 06/02/2019 1257 by Alen Blew, RN Outcome: Adequate for Discharge 06/02/2019 1257 by Alen Blew, RN Outcome: Adequate for Discharge   Problem: Education: Goal: Knowledge of General Education information will improve Description: Including pain rating scale, medication(s)/side effects and non-pharmacologic comfort measures 06/02/2019 1257 by Alen Blew, RN Outcome: Adequate for Discharge 06/02/2019 1257 by Alen Blew, RN Outcome: Adequate for Discharge   Problem: Health Behavior/Discharge Planning: Goal: Ability to manage health-related needs will improve 06/02/2019 1257 by Alen Blew, RN Outcome: Adequate for Discharge 06/02/2019 1257 by Alen Blew, RN Outcome: Adequate for Discharge   Problem: Clinical Measurements: Goal: Ability to maintain clinical measurements within normal limits will improve 06/02/2019 1257 by Roanna Epley D, RN Outcome: Adequate for Discharge 06/02/2019 1257 by Alen Blew, RN Outcome: Adequate for Discharge Goal: Will remain free from infection 06/02/2019 1257 by Roanna Epley D, RN Outcome: Adequate for Discharge 06/02/2019 1257 by Alen Blew, RN Outcome: Adequate for Discharge Goal: Diagnostic test results will improve 06/02/2019 1257 by Alen Blew, RN Outcome: Adequate for Discharge 06/02/2019 1257 by Alen Blew, RN Outcome: Adequate for Discharge Goal: Respiratory complications will improve 06/02/2019 1257 by Alen Blew, RN Outcome: Adequate for Discharge 06/02/2019 1257 by Alen Blew, RN Outcome: Adequate for Discharge Goal: Cardiovascular complication will be avoided 06/02/2019 1257 by Alen Blew, RN Outcome: Adequate for Discharge 06/02/2019 1257 by Alen Blew, RN Outcome: Adequate for Discharge   Problem: Activity: Goal: Risk for activity intolerance will decrease 06/02/2019 1257 by Alen Blew, RN Outcome:  Adequate for Discharge 06/02/2019 1257 by Alen Blew, RN Outcome: Adequate for Discharge   Problem: Nutrition: Goal: Adequate nutrition will be maintained 06/02/2019 1257 by Alen Blew, RN Outcome: Adequate for Discharge 06/02/2019 1257 by Alen Blew, RN Outcome: Adequate for Discharge   Problem: Elimination: Goal: Will not experience complications related to bowel motility 06/02/2019 1257 by Alen Blew, RN Outcome: Adequate for Discharge 06/02/2019 1257 by Alen Blew, RN Outcome: Adequate for Discharge Goal: Will not experience complications related to urinary retention 06/02/2019 1257 by Alen Blew, RN Outcome: Adequate for Discharge 06/02/2019 1257 by Alen Blew, RN Outcome: Adequate for Discharge   Problem: Skin Integrity: Goal: Risk for impaired skin integrity will decrease 06/02/2019 1257 by Alen Blew, RN Outcome: Adequate for Discharge 06/02/2019 1257 by Alen Blew, RN Outcome: Adequate for Discharge   Problem: Safety: Goal: Ability to remain free from injury will improve 06/02/2019 1257 by Alen Blew, RN Outcome: Adequate for Discharge 06/02/2019 1257 by Alen Blew, RN Outcome: Adequate for Discharge

## 2019-06-03 ENCOUNTER — Telehealth: Payer: Self-pay | Admitting: Family Medicine

## 2019-06-03 ENCOUNTER — Ambulatory Visit: Payer: Medicare HMO | Admitting: Family Medicine

## 2019-06-03 NOTE — Telephone Encounter (Signed)
Transition Care Management Follow-up Telephone Call   Date discharged? 06/02/2019   How have you been since you were released from the hospital? " Im going to take a shower and see how I feel. I got up and ate this morning and that went well. I slept good last night"   Do you understand why you were in the hospital? yes   Do you understand the discharge instructions? no   Where were you discharged to? Home with family member   Items Reviewed:  Medications reviewed: yes  Allergies reviewed: yes  Dietary changes reviewed: yes  Referrals reviewed: yes   Functional Questionnaire:   Activities of Daily Living (ADLs):   She states they are independent in the following: ambulation, bathing and hygiene, feeding, continence, grooming, toileting and dressing States they require assistance with the following: Family is helping her with every task but she does try to be independent - pt is waiting for her legs to strengthen   Any transportation issues/concerns?: no   Any patient concerns? no   Confirmed importance and date/time of follow-up visits scheduled yes  Provider Appointment booked with Dr Glori Bickers on 06/08/2019 at 2pm for a virtual visit  Confirmed with patient if condition begins to worsen call PCP or go to the ER.  Patient was given the office number and encouraged to call back with question or concerns.  : yes

## 2019-06-04 ENCOUNTER — Telehealth: Payer: Self-pay

## 2019-06-04 NOTE — Telephone Encounter (Signed)
I verified with Dr Glori Bickers that 8am on 06-08-19 would be fine to move her. Spoke to daughter and moved appt.

## 2019-06-04 NOTE — Telephone Encounter (Signed)
Pt's daughter called because a regular virtual visit will not work because she will be at work. Asked if just a phone call will do for the TCM hospital F/U visit scheduled 06-08-19 at 2pm.

## 2019-06-05 DIAGNOSIS — M1710 Unilateral primary osteoarthritis, unspecified knee: Secondary | ICD-10-CM | POA: Diagnosis not present

## 2019-06-05 DIAGNOSIS — I471 Supraventricular tachycardia: Secondary | ICD-10-CM | POA: Diagnosis not present

## 2019-06-05 DIAGNOSIS — I8393 Asymptomatic varicose veins of bilateral lower extremities: Secondary | ICD-10-CM | POA: Diagnosis not present

## 2019-06-05 DIAGNOSIS — M1810 Unilateral primary osteoarthritis of first carpometacarpal joint, unspecified hand: Secondary | ICD-10-CM | POA: Diagnosis not present

## 2019-06-05 DIAGNOSIS — I10 Essential (primary) hypertension: Secondary | ICD-10-CM | POA: Diagnosis not present

## 2019-06-05 DIAGNOSIS — M8589 Other specified disorders of bone density and structure, multiple sites: Secondary | ICD-10-CM | POA: Diagnosis not present

## 2019-06-05 DIAGNOSIS — R69 Illness, unspecified: Secondary | ICD-10-CM | POA: Diagnosis not present

## 2019-06-05 DIAGNOSIS — E119 Type 2 diabetes mellitus without complications: Secondary | ICD-10-CM | POA: Diagnosis not present

## 2019-06-05 DIAGNOSIS — C50512 Malignant neoplasm of lower-outer quadrant of left female breast: Secondary | ICD-10-CM | POA: Diagnosis not present

## 2019-06-05 DIAGNOSIS — E538 Deficiency of other specified B group vitamins: Secondary | ICD-10-CM | POA: Diagnosis not present

## 2019-06-05 DIAGNOSIS — M169 Osteoarthritis of hip, unspecified: Secondary | ICD-10-CM | POA: Diagnosis not present

## 2019-06-08 ENCOUNTER — Other Ambulatory Visit: Payer: Self-pay

## 2019-06-08 ENCOUNTER — Other Ambulatory Visit: Payer: Self-pay | Admitting: *Deleted

## 2019-06-08 ENCOUNTER — Encounter: Payer: Self-pay | Admitting: Family Medicine

## 2019-06-08 ENCOUNTER — Ambulatory Visit (INDEPENDENT_AMBULATORY_CARE_PROVIDER_SITE_OTHER): Payer: Medicare HMO | Admitting: Family Medicine

## 2019-06-08 ENCOUNTER — Ambulatory Visit: Payer: Medicare HMO | Admitting: Family Medicine

## 2019-06-08 VITALS — BP 132/84 | HR 101 | Temp 97.7°F | Ht 62.0 in | Wt 141.4 lb

## 2019-06-08 DIAGNOSIS — C50512 Malignant neoplasm of lower-outer quadrant of left female breast: Secondary | ICD-10-CM | POA: Diagnosis not present

## 2019-06-08 DIAGNOSIS — W19XXXA Unspecified fall, initial encounter: Secondary | ICD-10-CM | POA: Insufficient documentation

## 2019-06-08 DIAGNOSIS — R000 Tachycardia, unspecified: Secondary | ICD-10-CM | POA: Diagnosis not present

## 2019-06-08 DIAGNOSIS — W19XXXS Unspecified fall, sequela: Secondary | ICD-10-CM

## 2019-06-08 DIAGNOSIS — M169 Osteoarthritis of hip, unspecified: Secondary | ICD-10-CM | POA: Diagnosis not present

## 2019-06-08 DIAGNOSIS — M6281 Muscle weakness (generalized): Secondary | ICD-10-CM | POA: Diagnosis not present

## 2019-06-08 DIAGNOSIS — I10 Essential (primary) hypertension: Secondary | ICD-10-CM | POA: Diagnosis not present

## 2019-06-08 DIAGNOSIS — I8393 Asymptomatic varicose veins of bilateral lower extremities: Secondary | ICD-10-CM | POA: Diagnosis not present

## 2019-06-08 DIAGNOSIS — E119 Type 2 diabetes mellitus without complications: Secondary | ICD-10-CM | POA: Diagnosis not present

## 2019-06-08 DIAGNOSIS — F419 Anxiety disorder, unspecified: Secondary | ICD-10-CM

## 2019-06-08 DIAGNOSIS — R296 Repeated falls: Secondary | ICD-10-CM

## 2019-06-08 DIAGNOSIS — M1610 Unilateral primary osteoarthritis, unspecified hip: Secondary | ICD-10-CM | POA: Diagnosis not present

## 2019-06-08 DIAGNOSIS — M1810 Unilateral primary osteoarthritis of first carpometacarpal joint, unspecified hand: Secondary | ICD-10-CM | POA: Diagnosis not present

## 2019-06-08 DIAGNOSIS — E538 Deficiency of other specified B group vitamins: Secondary | ICD-10-CM | POA: Diagnosis not present

## 2019-06-08 DIAGNOSIS — I471 Supraventricular tachycardia: Secondary | ICD-10-CM | POA: Diagnosis not present

## 2019-06-08 DIAGNOSIS — R69 Illness, unspecified: Secondary | ICD-10-CM | POA: Diagnosis not present

## 2019-06-08 DIAGNOSIS — M8589 Other specified disorders of bone density and structure, multiple sites: Secondary | ICD-10-CM | POA: Diagnosis not present

## 2019-06-08 NOTE — Assessment & Plan Note (Signed)
bp in fair control at this time  BP Readings from Last 1 Encounters:  06/08/19 132/84  stable since d/c from hospital No changes needed Most recent labs reviewed  Disc lifstyle change with low sodium diet and exercise

## 2019-06-08 NOTE — Assessment & Plan Note (Signed)
celexa dose cut to 20 mg in hospital in light of risk of side eff/EKG changes stable

## 2019-06-08 NOTE — Patient Instructions (Signed)
Continue your PT and OT and talk to social work and your family about your living situation   You will be sore for a while  If the right groin area gets more painful please let me know   Do not walk without a walker or someone holding on to you   Fall prevention is the most important thing at this point  Follow up with cardiology as planned

## 2019-06-08 NOTE — Assessment & Plan Note (Signed)
Some pain in R hip girdle after fall- but walking well with support  Offered to re xray if symptoms worsen again
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
## 2019-06-08 NOTE — Assessment & Plan Note (Signed)
On recent hospitalization (misread as SVT at first) Pulse Readings from Last 3 Encounters:  06/08/19 (!) 101  06/02/19 (!) 108  05/25/19 (!) 103   She denies palpitations Has cardiology f/u planned  takine metoprolol xl 50 mg daily

## 2019-06-08 NOTE — Patient Outreach (Addendum)
[facility]) Care Management  06/08/2019  BREXLEY PEROVICH 1936-09-17 [id number]    EMMI-GENERAL DISCHARGE  RED ON EMMI ALERT Day # 4 Date: 06/07/2019 Red Alert Reason:D/C paperwork and Know who to call   Outreach attempt #1 RN able to reach pt and introduced [facility] services. Verified identifiers and explained the purpose for today's call. Pt receptive to further discussion. RN recited the information provided via EMMI report and pt has a clear understanding now of the inquires and verified she has had a virtual call from her provider's office already. Also states she had an actual office visit to her primary provider's office today with all information discussed concerning her discharge paperwork. RN explained the sheet with all her medications should have instructions for her follow up calls (pt verified this information was provided and review upon her discharge). Pt verbalized taking all her prescribed medications and sufficient transportation to all her scheduled appointments with no additional problems. Denies any additional symptoms related to tachycardia and states he is pending an appointment with a cardiologist on 9/15. Encouraged pt to verify at that time if her cardiologist would like for her to contact their office for cardia issues verus her primary provider (pt verbalized understanding). Pt is aware until she establish her CAD provider she needs to contact her primary provider with any encountered issues.  RN inquired on any other issues that may need attention at this time for social worker resources, pharmacy or nursing to assist at this time. Pt very appreciative but denies any other needs at this time. Pt again expressed how grateful she was for the follow up call and receptive to take the 24 hr nurses hotline contact number if any additional concerns arise.    Plan: RN CM will closed case from any further inquires at this time with no needs.  Raina Mina,  RN Care Management Coordinator [place] Office [phone]

## 2019-06-08 NOTE — Assessment & Plan Note (Signed)
Fall at home getting out of shower (had not eaten) - then hosp with sinus tachycardia Reviewed hospital records, lab results and studies in detail  Reassuring CT head Reassuring exam today-will watch rib and R hip soreness  Disc in detail plan for fall prev She is to use walker at all times Home PT /OT initiated Family is making a plan for assisted living

## 2019-06-10 DIAGNOSIS — M1710 Unilateral primary osteoarthritis, unspecified knee: Secondary | ICD-10-CM

## 2019-06-10 DIAGNOSIS — E119 Type 2 diabetes mellitus without complications: Secondary | ICD-10-CM | POA: Diagnosis not present

## 2019-06-10 DIAGNOSIS — Z170 Estrogen receptor positive status [ER+]: Secondary | ICD-10-CM

## 2019-06-10 DIAGNOSIS — I10 Essential (primary) hypertension: Secondary | ICD-10-CM | POA: Diagnosis not present

## 2019-06-10 DIAGNOSIS — E785 Hyperlipidemia, unspecified: Secondary | ICD-10-CM

## 2019-06-10 DIAGNOSIS — Z87891 Personal history of nicotine dependence: Secondary | ICD-10-CM

## 2019-06-10 DIAGNOSIS — C50512 Malignant neoplasm of lower-outer quadrant of left female breast: Secondary | ICD-10-CM | POA: Diagnosis not present

## 2019-06-10 DIAGNOSIS — Z7982 Long term (current) use of aspirin: Secondary | ICD-10-CM

## 2019-06-10 DIAGNOSIS — R69 Illness, unspecified: Secondary | ICD-10-CM | POA: Diagnosis not present

## 2019-06-10 DIAGNOSIS — Z7984 Long term (current) use of oral hypoglycemic drugs: Secondary | ICD-10-CM

## 2019-06-10 DIAGNOSIS — M1810 Unilateral primary osteoarthritis of first carpometacarpal joint, unspecified hand: Secondary | ICD-10-CM | POA: Diagnosis not present

## 2019-06-10 DIAGNOSIS — I8393 Asymptomatic varicose veins of bilateral lower extremities: Secondary | ICD-10-CM | POA: Diagnosis not present

## 2019-06-10 DIAGNOSIS — K219 Gastro-esophageal reflux disease without esophagitis: Secondary | ICD-10-CM

## 2019-06-10 DIAGNOSIS — Z8541 Personal history of malignant neoplasm of cervix uteri: Secondary | ICD-10-CM

## 2019-06-10 DIAGNOSIS — E538 Deficiency of other specified B group vitamins: Secondary | ICD-10-CM | POA: Diagnosis not present

## 2019-06-10 DIAGNOSIS — N3946 Mixed incontinence: Secondary | ICD-10-CM

## 2019-06-10 DIAGNOSIS — F419 Anxiety disorder, unspecified: Secondary | ICD-10-CM

## 2019-06-10 DIAGNOSIS — M169 Osteoarthritis of hip, unspecified: Secondary | ICD-10-CM | POA: Diagnosis not present

## 2019-06-10 DIAGNOSIS — J309 Allergic rhinitis, unspecified: Secondary | ICD-10-CM

## 2019-06-10 DIAGNOSIS — I471 Supraventricular tachycardia: Secondary | ICD-10-CM | POA: Diagnosis not present

## 2019-06-10 DIAGNOSIS — M8589 Other specified disorders of bone density and structure, multiple sites: Secondary | ICD-10-CM | POA: Diagnosis not present

## 2019-06-11 DIAGNOSIS — M8589 Other specified disorders of bone density and structure, multiple sites: Secondary | ICD-10-CM | POA: Diagnosis not present

## 2019-06-11 DIAGNOSIS — E538 Deficiency of other specified B group vitamins: Secondary | ICD-10-CM | POA: Diagnosis not present

## 2019-06-11 DIAGNOSIS — R69 Illness, unspecified: Secondary | ICD-10-CM | POA: Diagnosis not present

## 2019-06-11 DIAGNOSIS — I10 Essential (primary) hypertension: Secondary | ICD-10-CM | POA: Diagnosis not present

## 2019-06-11 DIAGNOSIS — C50512 Malignant neoplasm of lower-outer quadrant of left female breast: Secondary | ICD-10-CM | POA: Diagnosis not present

## 2019-06-11 DIAGNOSIS — I471 Supraventricular tachycardia: Secondary | ICD-10-CM | POA: Diagnosis not present

## 2019-06-11 DIAGNOSIS — M1810 Unilateral primary osteoarthritis of first carpometacarpal joint, unspecified hand: Secondary | ICD-10-CM | POA: Diagnosis not present

## 2019-06-11 DIAGNOSIS — I8393 Asymptomatic varicose veins of bilateral lower extremities: Secondary | ICD-10-CM | POA: Diagnosis not present

## 2019-06-11 DIAGNOSIS — M169 Osteoarthritis of hip, unspecified: Secondary | ICD-10-CM | POA: Diagnosis not present

## 2019-06-11 DIAGNOSIS — E119 Type 2 diabetes mellitus without complications: Secondary | ICD-10-CM | POA: Diagnosis not present
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
## 2019-06-14 ENCOUNTER — Other Ambulatory Visit: Payer: Self-pay | Admitting: Family Medicine

## 2019-06-14 DIAGNOSIS — M8589 Other specified disorders of bone density and structure, multiple sites: Secondary | ICD-10-CM | POA: Diagnosis not present

## 2019-06-14 DIAGNOSIS — M1810 Unilateral primary osteoarthritis of first carpometacarpal joint, unspecified hand: Secondary | ICD-10-CM | POA: Diagnosis not present

## 2019-06-14 DIAGNOSIS — R69 Illness, unspecified: Secondary | ICD-10-CM | POA: Diagnosis not present

## 2019-06-14 DIAGNOSIS — I471 Supraventricular tachycardia: Secondary | ICD-10-CM | POA: Diagnosis not present

## 2019-06-14 DIAGNOSIS — M169 Osteoarthritis of hip, unspecified: Secondary | ICD-10-CM | POA: Diagnosis not present

## 2019-06-14 DIAGNOSIS — C50512 Malignant neoplasm of lower-outer quadrant of left female breast: Secondary | ICD-10-CM | POA: Diagnosis not present

## 2019-06-14 DIAGNOSIS — E538 Deficiency of other specified B group vitamins: Secondary | ICD-10-CM | POA: Diagnosis not present

## 2019-06-14 DIAGNOSIS — I8393 Asymptomatic varicose veins of bilateral lower extremities: Secondary | ICD-10-CM | POA: Diagnosis not present

## 2019-06-14 DIAGNOSIS — I10 Essential (primary) hypertension: Secondary | ICD-10-CM | POA: Diagnosis not present

## 2019-06-14 DIAGNOSIS — E119 Type 2 diabetes mellitus without complications: Secondary | ICD-10-CM | POA: Diagnosis not present

## 2019-06-15 ENCOUNTER — Ambulatory Visit: Payer: Medicare HMO | Admitting: Physician Assistant

## 2019-06-15 ENCOUNTER — Telehealth: Payer: Self-pay | Admitting: Family Medicine

## 2019-06-15 DIAGNOSIS — M8589 Other specified disorders of bone density and structure, multiple sites: Secondary | ICD-10-CM | POA: Diagnosis not present

## 2019-06-15 DIAGNOSIS — E119 Type 2 diabetes mellitus without complications: Secondary | ICD-10-CM | POA: Diagnosis not present

## 2019-06-15 DIAGNOSIS — C50512 Malignant neoplasm of lower-outer quadrant of left female breast: Secondary | ICD-10-CM | POA: Diagnosis not present

## 2019-06-15 DIAGNOSIS — R69 Illness, unspecified: Secondary | ICD-10-CM | POA: Diagnosis not present

## 2019-06-15 DIAGNOSIS — M169 Osteoarthritis of hip, unspecified: Secondary | ICD-10-CM | POA: Diagnosis not present

## 2019-06-15 DIAGNOSIS — I8393 Asymptomatic varicose veins of bilateral lower extremities: Secondary | ICD-10-CM | POA: Diagnosis not present

## 2019-06-15 DIAGNOSIS — I471 Supraventricular tachycardia: Secondary | ICD-10-CM | POA: Diagnosis not present

## 2019-06-15 DIAGNOSIS — E538 Deficiency of other specified B group vitamins: Secondary | ICD-10-CM | POA: Diagnosis not present

## 2019-06-15 DIAGNOSIS — M1810 Unilateral primary osteoarthritis of first carpometacarpal joint, unspecified hand: Secondary | ICD-10-CM | POA: Diagnosis not present

## 2019-06-15 DIAGNOSIS — I10 Essential (primary) hypertension: Secondary | ICD-10-CM | POA: Diagnosis not present

## 2019-06-15 NOTE — Telephone Encounter (Signed)
Please ok that verbal order  

## 2019-06-15 NOTE — Telephone Encounter (Signed)
Colletta Maryland with [facility] is calling to get verbal orders for the patient   Occupational therapy 2x week 3 weeks    C/b# [phone]

## 2019-06-15 NOTE — Telephone Encounter (Signed)
Left VM giving Colletta Maryland the verbal order

## 2019-06-16 DIAGNOSIS — I8393 Asymptomatic varicose veins of bilateral lower extremities: Secondary | ICD-10-CM | POA: Diagnosis not present

## 2019-06-16 DIAGNOSIS — C50512 Malignant neoplasm of lower-outer quadrant of left female breast: Secondary | ICD-10-CM | POA: Diagnosis not present

## 2019-06-16 DIAGNOSIS — E538 Deficiency of other specified B group vitamins: Secondary | ICD-10-CM | POA: Diagnosis not present

## 2019-06-16 DIAGNOSIS — M169 Osteoarthritis of hip, unspecified: Secondary | ICD-10-CM | POA: Diagnosis not present

## 2019-06-16 DIAGNOSIS — E119 Type 2 diabetes mellitus without complications: Secondary | ICD-10-CM | POA: Diagnosis not present

## 2019-06-16 DIAGNOSIS — R69 Illness, unspecified: Secondary | ICD-10-CM | POA: Diagnosis not present

## 2019-06-16 DIAGNOSIS — M8589 Other specified disorders of bone density and structure, multiple sites: Secondary | ICD-10-CM | POA: Diagnosis not present

## 2019-06-16 DIAGNOSIS — I471 Supraventricular tachycardia: Secondary | ICD-10-CM | POA: Diagnosis not present

## 2019-06-16 DIAGNOSIS — M1810 Unilateral primary osteoarthritis of first carpometacarpal joint, unspecified hand: Secondary | ICD-10-CM | POA: Diagnosis not present

## 2019-06-16 DIAGNOSIS — I10 Essential (primary) hypertension: Secondary | ICD-10-CM | POA: Diagnosis not present

## 2019-06-17 DIAGNOSIS — I471 Supraventricular tachycardia: Secondary | ICD-10-CM | POA: Diagnosis not present

## 2019-06-17 DIAGNOSIS — M1810 Unilateral primary osteoarthritis of first carpometacarpal joint, unspecified hand: Secondary | ICD-10-CM | POA: Diagnosis not present

## 2019-06-17 DIAGNOSIS — R69 Illness, unspecified: Secondary | ICD-10-CM | POA: Diagnosis not present

## 2019-06-17 DIAGNOSIS — I10 Essential (primary) hypertension: Secondary | ICD-10-CM | POA: Diagnosis not present

## 2019-06-17 DIAGNOSIS — C50512 Malignant neoplasm of lower-outer quadrant of left female breast: Secondary | ICD-10-CM | POA: Diagnosis not present

## 2019-06-17 DIAGNOSIS — M8589 Other specified disorders of bone density and structure, multiple sites: Secondary | ICD-10-CM | POA: Diagnosis not present

## 2019-06-17 DIAGNOSIS — I8393 Asymptomatic varicose veins of bilateral lower extremities: Secondary | ICD-10-CM | POA: Diagnosis not present

## 2019-06-17 DIAGNOSIS — E119 Type 2 diabetes mellitus without complications: Secondary | ICD-10-CM | POA: Diagnosis not present

## 2019-06-17 DIAGNOSIS — M169 Osteoarthritis of hip, unspecified: Secondary | ICD-10-CM | POA: Diagnosis not present

## 2019-06-17 DIAGNOSIS — E538 Deficiency of other specified B group vitamins: Secondary | ICD-10-CM | POA: Diagnosis not present

## 2019-06-18 DIAGNOSIS — M1810 Unilateral primary osteoarthritis of first carpometacarpal joint, unspecified hand: Secondary | ICD-10-CM | POA: Diagnosis not present

## 2019-06-18 DIAGNOSIS — I10 Essential (primary) hypertension: Secondary | ICD-10-CM | POA: Diagnosis not present

## 2019-06-18 DIAGNOSIS — I8393 Asymptomatic varicose veins of bilateral lower extremities: Secondary | ICD-10-CM | POA: Diagnosis not present

## 2019-06-18 DIAGNOSIS — R69 Illness, unspecified: Secondary | ICD-10-CM | POA: Diagnosis not present

## 2019-06-18 DIAGNOSIS — C50512 Malignant neoplasm of lower-outer quadrant of left female breast: Secondary | ICD-10-CM | POA: Diagnosis not present

## 2019-06-18 DIAGNOSIS — E119 Type 2 diabetes mellitus without complications: Secondary | ICD-10-CM | POA: Diagnosis not present

## 2019-06-18 DIAGNOSIS — E538 Deficiency of other specified B group vitamins: Secondary | ICD-10-CM | POA: Diagnosis not present

## 2019-06-18 DIAGNOSIS — M8589 Other specified disorders of bone density and structure, multiple sites: Secondary | ICD-10-CM | POA: Diagnosis not present

## 2019-06-18 DIAGNOSIS — I471 Supraventricular tachycardia: Secondary | ICD-10-CM | POA: Diagnosis not present

## 2019-06-18 DIAGNOSIS — M169 Osteoarthritis of hip, unspecified: Secondary | ICD-10-CM | POA: Diagnosis not present

## 2019-06-21 DIAGNOSIS — M169 Osteoarthritis of hip, unspecified: Secondary | ICD-10-CM | POA: Diagnosis not present

## 2019-06-21 DIAGNOSIS — M8589 Other specified disorders of bone density and structure, multiple sites: Secondary | ICD-10-CM | POA: Diagnosis not present

## 2019-06-21 DIAGNOSIS — M1810 Unilateral primary osteoarthritis of first carpometacarpal joint, unspecified hand: Secondary | ICD-10-CM | POA: Diagnosis not present

## 2019-06-21 DIAGNOSIS — I471 Supraventricular tachycardia: Secondary | ICD-10-CM | POA: Diagnosis not present

## 2019-06-21 DIAGNOSIS — I10 Essential (primary) hypertension: Secondary | ICD-10-CM | POA: Diagnosis not present

## 2019-06-21 DIAGNOSIS — E538 Deficiency of other specified B group vitamins: Secondary | ICD-10-CM | POA: Diagnosis not present

## 2019-06-21 DIAGNOSIS — I8393 Asymptomatic varicose veins of bilateral lower extremities: Secondary | ICD-10-CM | POA: Diagnosis not present

## 2019-06-21 DIAGNOSIS — C50512 Malignant neoplasm of lower-outer quadrant of left female breast: Secondary | ICD-10-CM | POA: Diagnosis not present

## 2019-06-21 DIAGNOSIS — E119 Type 2 diabetes mellitus without complications: Secondary | ICD-10-CM | POA: Diagnosis not present

## 2019-06-21 DIAGNOSIS — R69 Illness, unspecified: Secondary | ICD-10-CM | POA: Diagnosis not present

## 2019-06-22 DIAGNOSIS — R69 Illness, unspecified: Secondary | ICD-10-CM | POA: Diagnosis not present

## 2019-06-22 DIAGNOSIS — I8393 Asymptomatic varicose veins of bilateral lower extremities: Secondary | ICD-10-CM | POA: Diagnosis not present

## 2019-06-22 DIAGNOSIS — I10 Essential (primary) hypertension: Secondary | ICD-10-CM | POA: Diagnosis not present

## 2019-06-22 DIAGNOSIS — M169 Osteoarthritis of hip, unspecified: Secondary | ICD-10-CM | POA: Diagnosis not present

## 2019-06-22 DIAGNOSIS — C50512 Malignant neoplasm of lower-outer quadrant of left female breast: Secondary | ICD-10-CM | POA: Diagnosis not present

## 2019-06-22 DIAGNOSIS — E538 Deficiency of other specified B group vitamins: Secondary | ICD-10-CM | POA: Diagnosis not present

## 2019-06-22 DIAGNOSIS — M1810 Unilateral primary osteoarthritis of first carpometacarpal joint, unspecified hand: Secondary | ICD-10-CM | POA: Diagnosis not present

## 2019-06-22 DIAGNOSIS — M8589 Other specified disorders of bone density and structure, multiple sites: Secondary | ICD-10-CM | POA: Diagnosis not present

## 2019-06-22 DIAGNOSIS — E119 Type 2 diabetes mellitus without complications: Secondary | ICD-10-CM | POA: Diagnosis not present

## 2019-06-22 DIAGNOSIS — I471 Supraventricular tachycardia: Secondary | ICD-10-CM | POA: Diagnosis not present

## 2019-06-23 DIAGNOSIS — E538 Deficiency of other specified B group vitamins: Secondary | ICD-10-CM | POA: Diagnosis not present

## 2019-06-23 DIAGNOSIS — I10 Essential (primary) hypertension: Secondary | ICD-10-CM | POA: Diagnosis not present

## 2019-06-23 DIAGNOSIS — C50512 Malignant neoplasm of lower-outer quadrant of left female breast: Secondary | ICD-10-CM | POA: Diagnosis not present

## 2019-06-23 DIAGNOSIS — I8393 Asymptomatic varicose veins of bilateral lower extremities: Secondary | ICD-10-CM | POA: Diagnosis not present

## 2019-06-23 DIAGNOSIS — M8589 Other specified disorders of bone density and structure, multiple sites: Secondary | ICD-10-CM | POA: Diagnosis not present

## 2019-06-23 DIAGNOSIS — I471 Supraventricular tachycardia: Secondary | ICD-10-CM | POA: Diagnosis not present

## 2019-06-23 DIAGNOSIS — E119 Type 2 diabetes mellitus without complications: Secondary | ICD-10-CM | POA: Diagnosis not present

## 2019-06-23 DIAGNOSIS — R69 Illness, unspecified: Secondary | ICD-10-CM | POA: Diagnosis not present

## 2019-06-23 DIAGNOSIS — M1810 Unilateral primary osteoarthritis of first carpometacarpal joint, unspecified hand: Secondary | ICD-10-CM | POA: Diagnosis not present

## 2019-06-23 DIAGNOSIS — M169 Osteoarthritis of hip, unspecified: Secondary | ICD-10-CM | POA: Diagnosis not present

## 2019-06-24 ENCOUNTER — Encounter: Payer: Self-pay | Admitting: Nurse Practitioner

## 2019-06-24 ENCOUNTER — Other Ambulatory Visit: Payer: Self-pay

## 2019-06-24 ENCOUNTER — Ambulatory Visit (INDEPENDENT_AMBULATORY_CARE_PROVIDER_SITE_OTHER): Payer: Medicare HMO | Admitting: Nurse Practitioner

## 2019-06-24 VITALS — BP 138/72 | HR 90 | Ht 62.0 in | Wt 145.2 lb

## 2019-06-24 DIAGNOSIS — I248 Other forms of acute ischemic heart disease: Secondary | ICD-10-CM

## 2019-06-24 DIAGNOSIS — I471 Supraventricular tachycardia: Secondary | ICD-10-CM | POA: Diagnosis not present

## 2019-06-24 DIAGNOSIS — E782 Mixed hyperlipidemia: Secondary | ICD-10-CM

## 2019-06-24 DIAGNOSIS — I429 Cardiomyopathy, unspecified: Secondary | ICD-10-CM | POA: Diagnosis not present

## 2019-06-24 DIAGNOSIS — I10 Essential (primary) hypertension: Secondary | ICD-10-CM

## 2019-06-24 DIAGNOSIS — R69 Illness, unspecified: Secondary | ICD-10-CM | POA: Diagnosis not present

## 2019-06-24 DIAGNOSIS — M1810 Unilateral primary osteoarthritis of first carpometacarpal joint, unspecified hand: Secondary | ICD-10-CM | POA: Diagnosis not present

## 2019-06-24 DIAGNOSIS — E538 Deficiency of other specified B group vitamins: Secondary | ICD-10-CM | POA: Diagnosis not present

## 2019-06-24 DIAGNOSIS — M169 Osteoarthritis of hip, unspecified: Secondary | ICD-10-CM | POA: Diagnosis not present

## 2019-06-24 DIAGNOSIS — I8393 Asymptomatic varicose veins of bilateral lower extremities: Secondary | ICD-10-CM | POA: Diagnosis not present

## 2019-06-24 DIAGNOSIS — C50512 Malignant neoplasm of lower-outer quadrant of left female breast: Secondary | ICD-10-CM | POA: Diagnosis not present

## 2019-06-24 DIAGNOSIS — M8589 Other specified disorders of bone density and structure, multiple sites: Secondary | ICD-10-CM | POA: Diagnosis not present

## 2019-06-24 DIAGNOSIS — E119 Type 2 diabetes mellitus without complications: Secondary | ICD-10-CM | POA: Diagnosis not present
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
## 2019-06-24 NOTE — Patient Instructions (Signed)
Medication Instructions:  Your physician recommends that you continue on your current medications as directed. Please refer to the Current Medication list given to you today.  If you need a refill on your cardiac medications before your next appointment, please call your pharmacy.   Lab work: None ordered  If you have labs (blood work) drawn today and your tests are completely normal, you will receive your results only by: Marland Kitchen MyChart Message (if you have MyChart) OR . A paper copy in the mail If you have any lab test that is abnormal or we need to change your treatment, we will call you to review the results.  Testing/Procedures: 1- [place]  Your caregiver has ordered a Stress Test with nuclear imaging. The purpose of this test is to evaluate the blood supply to your heart muscle. This procedure is referred to as a "Non-Invasive Stress Test." This is because other than having an IV started in your vein, nothing is inserted or "invades" your body. Cardiac stress tests are done to find areas of poor blood flow to the heart by determining the extent of coronary artery disease (CAD). Some patients exercise on a treadmill, which naturally increases the blood flow to your heart, while others who are  unable to walk on a treadmill due to physical limitations have a pharmacologic/chemical stress agent called Lexiscan . This medicine will mimic walking on a treadmill by temporarily increasing your coronary blood flow.   Please note: these test may take anywhere between 2-4 hours to complete  PLEASE REPORT TO [facility] AT THE FIRST DESK WILL DIRECT YOU WHERE TO GO  Date of Procedure:_____________________________________  Arrival Time for Procedure:______________________________  Instructions regarding medication:   __x__ : Hold diabetes medication morning of procedure (glipizide, metformin hold 12 hours prior to procedure)  __x__:  Hold betablocker(s) night  before procedure and morning of procedure (metoprolol)    PLEASE NOTIFY THE OFFICE AT LEAST 24 HOURS IN ADVANCE IF YOU ARE UNABLE TO KEEP YOUR APPOINTMENT.  [phone] AND  PLEASE NOTIFY NUCLEAR MEDICINE AT [facility] AT LEAST 24 HOURS IN ADVANCE IF YOU ARE UNABLE TO KEEP YOUR APPOINTMENT. [phone]  How to prepare for your Myoview test:  1. Do not eat or drink after midnight 2. No caffeine for 24 hours prior to test (includes decaf and chocolate) 3. No smoking 24 hours prior to test. 4. Your medication may be taken with water.  If your doctor stopped a medication because of this test, do not take that medication. 5. Ladies, please do not wear dresses.  Skirts or pants are appropriate. Please wear a short sleeve shirt. 6. No perfume, cologne or lotion. 7. Wear comfortable walking shoes. No heels!   Follow-Up: At [facility], you and your health needs are our priority.  As part of our continuing mission to provide you with exceptional heart care, we have created designated Provider Care Teams.  These Care Teams include your primary Cardiologist (physician) and Advanced Practice Providers (APPs -  Physician Assistants and Nurse Practitioners) who all work together to provide you with the care you need, when you need it. You will need a follow up appointment in 1 months.  You may see Ida Rogue, MD or Murray Hodgkins, NP.

## 2019-06-28 DIAGNOSIS — I8393 Asymptomatic varicose veins of bilateral lower extremities: Secondary | ICD-10-CM | POA: Diagnosis not present

## 2019-06-28 DIAGNOSIS — R69 Illness, unspecified: Secondary | ICD-10-CM | POA: Diagnosis not present

## 2019-06-28 DIAGNOSIS — M8589 Other specified disorders of bone density and structure, multiple sites: Secondary | ICD-10-CM | POA: Diagnosis not present

## 2019-06-28 DIAGNOSIS — M1810 Unilateral primary osteoarthritis of first carpometacarpal joint, unspecified hand: Secondary | ICD-10-CM | POA: Diagnosis not present

## 2019-06-28 DIAGNOSIS — E538 Deficiency of other specified B group vitamins: Secondary | ICD-10-CM | POA: Diagnosis not present

## 2019-06-28 DIAGNOSIS — C50512 Malignant neoplasm of lower-outer quadrant of left female breast: Secondary | ICD-10-CM | POA: Diagnosis not present

## 2019-06-28 DIAGNOSIS — M169 Osteoarthritis of hip, unspecified: Secondary | ICD-10-CM | POA: Diagnosis not present

## 2019-06-28 DIAGNOSIS — I471 Supraventricular tachycardia: Secondary | ICD-10-CM | POA: Diagnosis not present

## 2019-06-28 DIAGNOSIS — E119 Type 2 diabetes mellitus without complications: Secondary | ICD-10-CM | POA: Diagnosis not present

## 2019-06-28 DIAGNOSIS — I10 Essential (primary) hypertension: Secondary | ICD-10-CM | POA: Diagnosis not present

## 2019-06-29 ENCOUNTER — Emergency Department: Payer: Medicare HMO

## 2019-06-29 ENCOUNTER — Emergency Department
Admission: EM | Admit: 2019-06-29 | Discharge: 2019-06-29 | Disposition: A | Discharge status: Home / Self Care | Payer: Medicare HMO | Attending: Student in an Organized Health Care Education/Training Program | Admitting: Student in an Organized Health Care Education/Training Program

## 2019-06-29 ENCOUNTER — Encounter: Payer: Self-pay | Admitting: Emergency Medicine

## 2019-06-29 ENCOUNTER — Other Ambulatory Visit: Payer: Self-pay

## 2019-06-29 DIAGNOSIS — Z8542 Personal history of malignant neoplasm of other parts of uterus: Secondary | ICD-10-CM | POA: Insufficient documentation

## 2019-06-29 DIAGNOSIS — Z7982 Long term (current) use of aspirin: Secondary | ICD-10-CM | POA: Insufficient documentation

## 2019-06-29 DIAGNOSIS — M19012 Primary osteoarthritis, left shoulder: Secondary | ICD-10-CM | POA: Diagnosis not present

## 2019-06-29 DIAGNOSIS — Z853 Personal history of malignant neoplasm of breast: Secondary | ICD-10-CM | POA: Diagnosis not present

## 2019-06-29 DIAGNOSIS — E119 Type 2 diabetes mellitus without complications: Secondary | ICD-10-CM | POA: Diagnosis not present

## 2019-06-29 DIAGNOSIS — Z87891 Personal history of nicotine dependence: Secondary | ICD-10-CM | POA: Insufficient documentation

## 2019-06-29 DIAGNOSIS — R0902 Hypoxemia: Secondary | ICD-10-CM | POA: Diagnosis not present

## 2019-06-29 DIAGNOSIS — W19XXXD Unspecified fall, subsequent encounter: Secondary | ICD-10-CM | POA: Diagnosis not present

## 2019-06-29 DIAGNOSIS — Z8541 Personal history of malignant neoplasm of cervix uteri: Secondary | ICD-10-CM | POA: Diagnosis not present

## 2019-06-29 DIAGNOSIS — M25512 Pain in left shoulder: Secondary | ICD-10-CM

## 2019-06-29 DIAGNOSIS — I10 Essential (primary) hypertension: Secondary | ICD-10-CM | POA: Insufficient documentation

## 2019-06-29 DIAGNOSIS — R52 Pain, unspecified: Secondary | ICD-10-CM | POA: Diagnosis not present

## 2019-06-29 DIAGNOSIS — R51 Headache: Secondary | ICD-10-CM | POA: Diagnosis not present

## 2019-06-29 DIAGNOSIS — Z7984 Long term (current) use of oral hypoglycemic drugs: Secondary | ICD-10-CM | POA: Insufficient documentation

## 2019-06-29 DIAGNOSIS — Z79899 Other long term (current) drug therapy: Secondary | ICD-10-CM | POA: Insufficient documentation

## 2019-06-29 LAB — COMPREHENSIVE METABOLIC PANEL
ALT: 14 U/L (ref 0–44)
AST: 18 U/L (ref 15–41)
Albumin: 3.7 g/dL (ref 3.5–5.0)
Alkaline Phosphatase: 54 U/L (ref 38–126)
Anion gap: 9 (ref 5–15)
BUN: 13 mg/dL (ref 8–23)
CO2: 25 mmol/L (ref 22–32)
Calcium: 9.7 mg/dL (ref 8.9–10.3)
Chloride: 98 mmol/L (ref 98–111)
Creatinine, Ser: 0.5 mg/dL (ref 0.44–1.00)
GFR calc Af Amer: >60 mL/min (ref >60)
GFR calc non Af Amer: >60 mL/min (ref >60)
Glucose, Bld: 149 mg/dL — ABNORMAL HIGH (ref 70–99)
Potassium: 4.2 mmol/L (ref 3.5–5.1)
Sodium: 132 mmol/L — ABNORMAL LOW (ref 135–145)
Total Bilirubin: 0.5 mg/dL (ref 0.3–1.2)
Total Protein: 6.5 g/dL (ref 6.5–8.1)

## 2019-06-29 LAB — CBC WITH DIFFERENTIAL/PLATELET
Abs Immature Granulocytes: 0.03 10*3/uL (ref 0.00–0.07)
Basophils Absolute: 0.1 10*3/uL (ref 0.0–0.1)
Basophils Relative: 1 %
Eosinophils Absolute: 0.2 10*3/uL (ref 0.0–0.5)
Eosinophils Relative: 2 %
HCT: 36.8 % (ref 36.0–46.0)
Hemoglobin: 11.8 g/dL — ABNORMAL LOW (ref 12.0–15.0)
Immature Granulocytes: 0 %
Lymphocytes Relative: 22 %
Lymphs Abs: 1.9 10*3/uL (ref 0.7–4.0)
MCH: 27.6 pg (ref 26.0–34.0)
MCHC: 32.1 g/dL (ref 30.0–36.0)
MCV: 86 fL (ref 80.0–100.0)
Monocytes Absolute: 0.8 10*3/uL (ref 0.1–1.0)
Monocytes Relative: 10 %
Neutro Abs: 5.5 10*3/uL (ref 1.7–7.7)
Neutrophils Relative %: 65 %
Platelets: 297 10*3/uL (ref 150–400)
RBC: 4.28 MIL/uL (ref 3.87–5.11)
RDW: 12.9 % (ref 11.5–15.5)
WBC: 8.5 10*3/uL (ref 4.0–10.5)
nRBC: 0 % (ref 0.0–0.2)

## 2019-06-29 LAB — TROPONIN I (HIGH SENSITIVITY): Troponin I (High Sensitivity): 6 ng/L (ref <18)

## 2019-06-29 NOTE — ED Notes (Signed)
Daughter called on way to pick patient up

## 2019-06-29 NOTE — ED Notes (Signed)
Repeat ekg completed and given to Md.
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
## 2019-06-29 NOTE — ED Notes (Signed)
Laura Bell [phone] call for updates and for discharge.

## 2019-06-29 NOTE — ED Triage Notes (Signed)
Pt to ER states she has been having left shoulder and neck pain that runs up into her head for the last 3 days.  Pt also reports high BP for last 3 days.  Pt had a fall several weeks ago and was hospitalized for several days.  Pt denies chest pain, SHOB, or other s/s at this time.

## 2019-06-29 NOTE — ED Notes (Signed)
Initially came for pain in her left shoulder up to her left ear, was concerned because her blood pressure has been high. Pain was not in her chest or down her left arm. Put warm blanket on her left shoulder in lobby and states now she isn't having pain. NAD.

## 2019-07-01 DIAGNOSIS — R69 Illness, unspecified: Secondary | ICD-10-CM | POA: Diagnosis not present

## 2019-07-01 DIAGNOSIS — M8589 Other specified disorders of bone density and structure, multiple sites: Secondary | ICD-10-CM | POA: Diagnosis not present

## 2019-07-01 DIAGNOSIS — I471 Supraventricular tachycardia: Secondary | ICD-10-CM | POA: Diagnosis not present

## 2019-07-01 DIAGNOSIS — E538 Deficiency of other specified B group vitamins: Secondary | ICD-10-CM | POA: Diagnosis not present

## 2019-07-01 DIAGNOSIS — I8393 Asymptomatic varicose veins of bilateral lower extremities: Secondary | ICD-10-CM | POA: Diagnosis not present

## 2019-07-01 DIAGNOSIS — E119 Type 2 diabetes mellitus without complications: Secondary | ICD-10-CM | POA: Diagnosis not present

## 2019-07-01 DIAGNOSIS — I10 Essential (primary) hypertension: Secondary | ICD-10-CM | POA: Diagnosis not present

## 2019-07-01 DIAGNOSIS — C50512 Malignant neoplasm of lower-outer quadrant of left female breast: Secondary | ICD-10-CM | POA: Diagnosis not present

## 2019-07-01 DIAGNOSIS — M169 Osteoarthritis of hip, unspecified: Secondary | ICD-10-CM | POA: Diagnosis not present

## 2019-07-01 DIAGNOSIS — M1810 Unilateral primary osteoarthritis of first carpometacarpal joint, unspecified hand: Secondary | ICD-10-CM | POA: Diagnosis not present

## 2019-07-02 DIAGNOSIS — R69 Illness, unspecified: Secondary | ICD-10-CM | POA: Diagnosis not present

## 2019-07-02 DIAGNOSIS — E119 Type 2 diabetes mellitus without complications: Secondary | ICD-10-CM | POA: Diagnosis not present

## 2019-07-02 DIAGNOSIS — M169 Osteoarthritis of hip, unspecified: Secondary | ICD-10-CM | POA: Diagnosis not present

## 2019-07-02 DIAGNOSIS — E538 Deficiency of other specified B group vitamins: Secondary | ICD-10-CM | POA: Diagnosis not present

## 2019-07-02 DIAGNOSIS — I471 Supraventricular tachycardia: Secondary | ICD-10-CM | POA: Diagnosis not present

## 2019-07-02 DIAGNOSIS — I10 Essential (primary) hypertension: Secondary | ICD-10-CM | POA: Diagnosis not present

## 2019-07-02 DIAGNOSIS — M8589 Other specified disorders of bone density and structure, multiple sites: Secondary | ICD-10-CM | POA: Diagnosis not present

## 2019-07-02 DIAGNOSIS — M1810 Unilateral primary osteoarthritis of first carpometacarpal joint, unspecified hand: Secondary | ICD-10-CM | POA: Diagnosis not present

## 2019-07-02 DIAGNOSIS — C50512 Malignant neoplasm of lower-outer quadrant of left female breast: Secondary | ICD-10-CM | POA: Diagnosis not present

## 2019-07-02 DIAGNOSIS — I8393 Asymptomatic varicose veins of bilateral lower extremities: Secondary | ICD-10-CM | POA: Diagnosis not present

## 2019-07-05 DIAGNOSIS — M1810 Unilateral primary osteoarthritis of first carpometacarpal joint, unspecified hand: Secondary | ICD-10-CM | POA: Diagnosis not present

## 2019-07-05 DIAGNOSIS — E538 Deficiency of other specified B group vitamins: Secondary | ICD-10-CM | POA: Diagnosis not present

## 2019-07-05 DIAGNOSIS — R69 Illness, unspecified: Secondary | ICD-10-CM | POA: Diagnosis not present

## 2019-07-05 DIAGNOSIS — E119 Type 2 diabetes mellitus without complications: Secondary | ICD-10-CM | POA: Diagnosis not present

## 2019-07-05 DIAGNOSIS — I471 Supraventricular tachycardia: Secondary | ICD-10-CM | POA: Diagnosis not present

## 2019-07-05 DIAGNOSIS — M169 Osteoarthritis of hip, unspecified: Secondary | ICD-10-CM | POA: Diagnosis not present

## 2019-07-05 DIAGNOSIS — M8589 Other specified disorders of bone density and structure, multiple sites: Secondary | ICD-10-CM | POA: Diagnosis not present

## 2019-07-05 DIAGNOSIS — C50512 Malignant neoplasm of lower-outer quadrant of left female breast: Secondary | ICD-10-CM | POA: Diagnosis not present

## 2019-07-05 DIAGNOSIS — I8393 Asymptomatic varicose veins of bilateral lower extremities: Secondary | ICD-10-CM | POA: Diagnosis not present

## 2019-07-05 DIAGNOSIS — I10 Essential (primary) hypertension: Secondary | ICD-10-CM | POA: Diagnosis not present

## 2019-07-06 DIAGNOSIS — E119 Type 2 diabetes mellitus without complications: Secondary | ICD-10-CM | POA: Diagnosis not present

## 2019-07-06 DIAGNOSIS — R69 Illness, unspecified: Secondary | ICD-10-CM | POA: Diagnosis not present

## 2019-07-06 DIAGNOSIS — I8393 Asymptomatic varicose veins of bilateral lower extremities: Secondary | ICD-10-CM | POA: Diagnosis not present

## 2019-07-06 DIAGNOSIS — I10 Essential (primary) hypertension: Secondary | ICD-10-CM | POA: Diagnosis not present

## 2019-07-06 DIAGNOSIS — M1810 Unilateral primary osteoarthritis of first carpometacarpal joint, unspecified hand: Secondary | ICD-10-CM | POA: Diagnosis not present

## 2019-07-06 DIAGNOSIS — C50512 Malignant neoplasm of lower-outer quadrant of left female breast: Secondary | ICD-10-CM | POA: Diagnosis not present

## 2019-07-06 DIAGNOSIS — M169 Osteoarthritis of hip, unspecified: Secondary | ICD-10-CM | POA: Diagnosis not present

## 2019-07-06 DIAGNOSIS — I471 Supraventricular tachycardia: Secondary | ICD-10-CM | POA: Diagnosis not present

## 2019-07-06 DIAGNOSIS — M8589 Other specified disorders of bone density and structure, multiple sites: Secondary | ICD-10-CM | POA: Diagnosis not present

## 2019-07-06 DIAGNOSIS — E538 Deficiency of other specified B group vitamins: Secondary | ICD-10-CM | POA: Diagnosis not present

## 2019-07-07 ENCOUNTER — Ambulatory Visit: Payer: Medicare HMO | Admitting: Obstetrics & Gynecology

## 2019-07-08 ENCOUNTER — Other Ambulatory Visit: Payer: Self-pay

## 2019-07-08 ENCOUNTER — Encounter: Payer: Self-pay | Admitting: Obstetrics & Gynecology

## 2019-07-08 ENCOUNTER — Ambulatory Visit (INDEPENDENT_AMBULATORY_CARE_PROVIDER_SITE_OTHER): Payer: Medicare HMO | Admitting: Obstetrics & Gynecology

## 2019-07-08 VITALS — BP 150/80 | Ht 60.0 in | Wt 142.0 lb

## 2019-07-08 DIAGNOSIS — C50512 Malignant neoplasm of lower-outer quadrant of left female breast: Secondary | ICD-10-CM | POA: Diagnosis not present

## 2019-07-08 DIAGNOSIS — N3946 Mixed incontinence: Secondary | ICD-10-CM | POA: Diagnosis not present

## 2019-07-08 DIAGNOSIS — I471 Supraventricular tachycardia: Secondary | ICD-10-CM | POA: Diagnosis not present

## 2019-07-08 DIAGNOSIS — I8393 Asymptomatic varicose veins of bilateral lower extremities: Secondary | ICD-10-CM | POA: Diagnosis not present

## 2019-07-08 DIAGNOSIS — N8111 Cystocele, midline: Secondary | ICD-10-CM

## 2019-07-08 DIAGNOSIS — I10 Essential (primary) hypertension: Secondary | ICD-10-CM | POA: Diagnosis not present

## 2019-07-08 DIAGNOSIS — M169 Osteoarthritis of hip, unspecified: Secondary | ICD-10-CM | POA: Diagnosis not present

## 2019-07-08 DIAGNOSIS — M1810 Unilateral primary osteoarthritis of first carpometacarpal joint, unspecified hand: Secondary | ICD-10-CM | POA: Diagnosis not present

## 2019-07-08 DIAGNOSIS — R69 Illness, unspecified: Secondary | ICD-10-CM | POA: Diagnosis not present

## 2019-07-08 DIAGNOSIS — M8589 Other specified disorders of bone density and structure, multiple sites: Secondary | ICD-10-CM | POA: Diagnosis not present

## 2019-07-08 DIAGNOSIS — E538 Deficiency of other specified B group vitamins: Secondary | ICD-10-CM | POA: Diagnosis not present

## 2019-07-08 DIAGNOSIS — E119 Type 2 diabetes mellitus without complications: Secondary | ICD-10-CM | POA: Diagnosis not present

## 2019-07-08 NOTE — Progress Notes (Signed)
HPI:      [name] is a 83 y.o. No obstetric history on file. who presents today for her pessary follow up and examination related to her pelvic floor weakening.  Pt reports tolerating the pessary well with  no vaginal bleeding and  no vaginal discharge.  Symptoms of pelvic floor weakening have greatly improved. She is voiding and defecating without difficulty. She currently has a #5Ring pessary.  PMHx: She  has a past medical history of Arthritis, Bleeding disorder ([place]), Breast cancer ([place]) (10/11/13 dx), Cardiomyopathy ([place]), Cervical cancer ([place]), Demand ischemia ([place]), Full dentures, GERD (gastroesophageal reflux disease), History of stomach ulcers, Hypertension, Mixed hyperlipidemia, Osteoarthritis, Osteopenia, Stress reaction (2009), Type II diabetes mellitus ([place]), Urinary incontinence, Uterus cancer ([place]), and Varicose veins. Also,  has a past surgical history that includes Abdominal hysterectomy (1964); Bladder surgery (1995); Abdominal US (2007); Sclerotherapy varicose veins (5/08); left breast bx (1/15); Breast lumpectomy with needle localization (Left, 11/10/2013); Breast biopsy (Left, 09/2013); Breast biopsy (Left, 12/2014); and Breast lumpectomy (Left, 10/2013)., family history includes Cancer in her brother, brother, father, and sister; Cancer (age of onset: 48) in an other family member; Heart attack in her mother.,  reports that she quit smoking about 23 years ago. Her smoking use included cigarettes. She has a 39.20 pack-year smoking history. She has never used smokeless tobacco. She reports that she does not drink alcohol or use drugs.  She has a current medication list which includes the following prescription(s): accu-chek softclix lancets, acetaminophen, aspirin, onetouch verio, vitamin d, citalopram, conjugated estrogens, cyanocobalamin, famotidine, glipizide, onetouch verio, lisinopril, metformin, metoprolol succinate, nystatin-triamcinolone ointment, and simvastatin.  Also, is allergic to actos [pioglitazone]; alendronate sodium; boniva [ibandronate sodium]; ibandronic acid; lansoprazole; and omeprazole.  Review of Systems  All other systems reviewed and are negative.   Objective: BP (!) 150/80   Ht 5' (1.524 m)   Wt 142 lb (64.4 kg)   BMI 27.73 kg/m  Physical Exam Constitutional:      General: She is not in acute distress.    Appearance: She is well-developed.  Genitourinary:     Pelvic exam was performed with patient supine.     Vagina normal.     No vaginal erythema or bleeding.     Genitourinary Comments: Cuff intact/ no lesions Pelvic floor weakening with cystocele Absent uterus and cervix  HENT:     Head: Normocephalic and atraumatic.     Nose: Nose normal.  Abdominal:     General: There is no distension.     Palpations: Abdomen is soft.     Tenderness: There is no abdominal tenderness.  Musculoskeletal: Normal range of motion.  Neurological:     Mental Status: She is alert and oriented to person, place, and time.     Cranial Nerves: No cranial nerve deficit.  Skin:    General: Skin is warm and dry.  Psychiatric:        Attention and Perception: Attention normal.        Mood and Affect: Mood normal.        Speech: Speech normal.        Behavior: Behavior normal.        Cognition and Memory: Cognition normal.        Judgment: Judgment normal.     Pessary Care Pessary removed and cleaned.  Vagina checked - without erosions - pessary replaced.  A/P:1. Cystocele, midline 2. Mixed stress and urge urinary incontinence Pessary was cleaned and replaced today. Instructions given  for care. Concerning symptoms to observe for are counseled to patient. Follow up scheduled for 3 months.  A total of 15 minutes were spent face-to-face with the patient during this encounter and over half of that time dealt with counseling and coordination of care.  Laura Applebaum, MD, [facility], [facility] 07/08/2019  2:01 PM

## 2019-07-12 ENCOUNTER — Ambulatory Visit: Payer: Medicare HMO

## 2019-07-12 DIAGNOSIS — I10 Essential (primary) hypertension: Secondary | ICD-10-CM | POA: Diagnosis not present

## 2019-07-12 DIAGNOSIS — M1810 Unilateral primary osteoarthritis of first carpometacarpal joint, unspecified hand: Secondary | ICD-10-CM | POA: Diagnosis not present

## 2019-07-12 DIAGNOSIS — M169 Osteoarthritis of hip, unspecified: Secondary | ICD-10-CM | POA: Diagnosis not present

## 2019-07-12 DIAGNOSIS — I8393 Asymptomatic varicose veins of bilateral lower extremities: Secondary | ICD-10-CM | POA: Diagnosis not present

## 2019-07-12 DIAGNOSIS — M8589 Other specified disorders of bone density and structure, multiple sites: Secondary | ICD-10-CM | POA: Diagnosis not present

## 2019-07-12 DIAGNOSIS — E119 Type 2 diabetes mellitus without complications: Secondary | ICD-10-CM | POA: Diagnosis not present

## 2019-07-12 DIAGNOSIS — I471 Supraventricular tachycardia: Secondary | ICD-10-CM | POA: Diagnosis not present

## 2019-07-12 DIAGNOSIS — E538 Deficiency of other specified B group vitamins: Secondary | ICD-10-CM | POA: Diagnosis not present

## 2019-07-12 DIAGNOSIS — R69 Illness, unspecified: Secondary | ICD-10-CM | POA: Diagnosis not present

## 2019-07-12 DIAGNOSIS — C50512 Malignant neoplasm of lower-outer quadrant of left female breast: Secondary | ICD-10-CM | POA: Diagnosis not present

## 2019-07-14 DIAGNOSIS — M1810 Unilateral primary osteoarthritis of first carpometacarpal joint, unspecified hand: Secondary | ICD-10-CM | POA: Diagnosis not present

## 2019-07-14 DIAGNOSIS — I471 Supraventricular tachycardia: Secondary | ICD-10-CM | POA: Diagnosis not present

## 2019-07-14 DIAGNOSIS — E538 Deficiency of other specified B group vitamins: Secondary | ICD-10-CM | POA: Diagnosis not present

## 2019-07-14 DIAGNOSIS — I10 Essential (primary) hypertension: Secondary | ICD-10-CM | POA: Diagnosis not present

## 2019-07-14 DIAGNOSIS — C50512 Malignant neoplasm of lower-outer quadrant of left female breast: Secondary | ICD-10-CM | POA: Diagnosis not present

## 2019-07-14 DIAGNOSIS — M8589 Other specified disorders of bone density and structure, multiple sites: Secondary | ICD-10-CM | POA: Diagnosis not present

## 2019-07-14 DIAGNOSIS — R69 Illness, unspecified: Secondary | ICD-10-CM | POA: Diagnosis not present

## 2019-07-14 DIAGNOSIS — E119 Type 2 diabetes mellitus without complications: Secondary | ICD-10-CM | POA: Diagnosis not present

## 2019-07-14 DIAGNOSIS — M169 Osteoarthritis of hip, unspecified: Secondary | ICD-10-CM | POA: Diagnosis not present

## 2019-07-14 DIAGNOSIS — I8393 Asymptomatic varicose veins of bilateral lower extremities: Secondary | ICD-10-CM | POA: Diagnosis not present

## 2019-07-15 DIAGNOSIS — I10 Essential (primary) hypertension: Secondary | ICD-10-CM | POA: Diagnosis not present

## 2019-07-15 DIAGNOSIS — C50512 Malignant neoplasm of lower-outer quadrant of left female breast: Secondary | ICD-10-CM | POA: Diagnosis not present

## 2019-07-15 DIAGNOSIS — R69 Illness, unspecified: Secondary | ICD-10-CM | POA: Diagnosis not present

## 2019-07-15 DIAGNOSIS — E538 Deficiency of other specified B group vitamins: Secondary | ICD-10-CM | POA: Diagnosis not present

## 2019-07-15 DIAGNOSIS — M169 Osteoarthritis of hip, unspecified: Secondary | ICD-10-CM | POA: Diagnosis not present

## 2019-07-15 DIAGNOSIS — I471 Supraventricular tachycardia: Secondary | ICD-10-CM | POA: Diagnosis not present

## 2019-07-15 DIAGNOSIS — M1810 Unilateral primary osteoarthritis of first carpometacarpal joint, unspecified hand: Secondary | ICD-10-CM | POA: Diagnosis not present

## 2019-07-15 DIAGNOSIS — E119 Type 2 diabetes mellitus without complications: Secondary | ICD-10-CM | POA: Diagnosis not present

## 2019-07-15 DIAGNOSIS — M8589 Other specified disorders of bone density and structure, multiple sites: Secondary | ICD-10-CM | POA: Diagnosis not present

## 2019-07-15 DIAGNOSIS — I8393 Asymptomatic varicose veins of bilateral lower extremities: Secondary | ICD-10-CM | POA: Diagnosis not present

## 2019-07-27 ENCOUNTER — Ambulatory Visit: Payer: Medicare HMO | Admitting: Cardiovascular Disease

## 2019-07-29 ENCOUNTER — Ambulatory Visit: Payer: Medicare HMO | Admitting: Family Medicine

## 2019-07-30 ENCOUNTER — Encounter: Payer: Self-pay | Admitting: Family Medicine

## 2019-07-30 ENCOUNTER — Ambulatory Visit (INDEPENDENT_AMBULATORY_CARE_PROVIDER_SITE_OTHER): Payer: Medicare HMO | Admitting: Family Medicine

## 2019-07-30 ENCOUNTER — Other Ambulatory Visit: Payer: Self-pay

## 2019-07-30 ENCOUNTER — Telehealth: Payer: Self-pay | Admitting: *Deleted

## 2019-07-30 VITALS — BP 138/80 | HR 102 | Temp 97.9°F | Ht 60.0 in | Wt 145.1 lb

## 2019-07-30 DIAGNOSIS — R8290 Unspecified abnormal findings in urine: Secondary | ICD-10-CM | POA: Diagnosis not present

## 2019-07-30 DIAGNOSIS — R300 Dysuria: Secondary | ICD-10-CM

## 2019-07-30 LAB — POC URINALSYSI DIPSTICK (AUTOMATED)
Bilirubin, UA: NEGATIVE
Blood, UA: 50
Glucose, UA: POSITIVE — AB
Ketones, UA: NEGATIVE
Nitrite, UA: NEGATIVE
Protein, UA: NEGATIVE
Spec Grav, UA: 1.02 (ref 1.010–1.025)
Urobilinogen, UA: 0.2 E.U./dL
pH, UA: 6 (ref 5.0–8.0)

## 2019-07-30 MED ORDER — CEPHALEXIN 500 MG PO CAPS: 500.0000 mg | ORAL_CAPSULE | Freq: Two times a day (BID) | ORAL | 0 refills | Status: DC

## 2019-07-30 NOTE — Telephone Encounter (Signed)
-----   Message from Abner Greenspan, MD sent at 07/30/2019  4:58 PM EDT ----- Pos ua  Please send keflex 500 mg bid for 7d #14 no ref to her preferred pharmacy  Encourage water  Send culture  Thanks
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
## 2019-07-30 NOTE — Telephone Encounter (Signed)
Pt notified of UA results and Dr. Marliss Coots comments, Rx sent to pharmacy and cx ordered

## 2019-07-30 NOTE — Patient Instructions (Addendum)
Call the gyn office about your pessary   Also start back using the estrogen cream from urology   We will get a sample and then a culture  We will treat you if it looks infected    Keep drinking lots of water  If you develop fever or nausea let us know

## 2019-07-31 NOTE — Assessment & Plan Note (Signed)
Pos ua  Pending culture  Cover with keflex Enc water intake inst to f/u with gyn re: pessary  Also start back using estrogen cream from urology

## 2019-08-01 LAB — URINE CULTURE
MICRO NUMBER:: 1049278
SPECIMEN QUALITY:: ADEQUATE

## 2019-08-03 NOTE — Telephone Encounter (Signed)
Pt called back wanting a call from [facility] she has questions on her meds

## 2019-08-03 NOTE — Telephone Encounter (Signed)
Pt called back I instructed pt on Dr. Marliss Coots comments and recommendations again. Pt has an appt with GYN on Friday so will f/u with them regarding urinary issues

## 2019-08-06 ENCOUNTER — Ambulatory Visit (INDEPENDENT_AMBULATORY_CARE_PROVIDER_SITE_OTHER): Payer: Medicare HMO | Admitting: Obstetrics & Gynecology

## 2019-08-06 ENCOUNTER — Encounter: Payer: Self-pay | Admitting: Obstetrics & Gynecology

## 2019-08-06 ENCOUNTER — Other Ambulatory Visit: Payer: Self-pay

## 2019-08-06 VITALS — BP 150/90 | Ht 62.0 in | Wt 144.0 lb

## 2019-08-06 DIAGNOSIS — N8111 Cystocele, midline: Secondary | ICD-10-CM | POA: Diagnosis not present

## 2019-08-06 NOTE — Progress Notes (Signed)
HPI:      [name] is a 83 y.o.who presents today for her  examination related to recent vaginal irritation and discomfort.  Has seen PCP and UA and monitoring for infection.  She uses a Ring#5 pessary for her pelvic floor weakening.  Pt reports tolerating the pessary well with  no vaginal bleeding and  no vaginal discharge.  Symptoms of pelvic floor weakening have greatly improved. She is voiding and defecating without difficulty.  PMHx: She  has a past medical history of Arthritis, Bleeding disorder ([place]), Breast cancer ([place]) (10/11/13 dx), Cardiomyopathy ([place]), Cervical cancer ([place]), Demand ischemia ([place]), Full dentures, GERD (gastroesophageal reflux disease), History of stomach ulcers, Hypertension, Mixed hyperlipidemia, Osteoarthritis, Osteopenia, Stress reaction (2009), Type II diabetes mellitus ([place]), Urinary incontinence, Uterus cancer ([place]), and Varicose veins. Also,  has a past surgical history that includes Abdominal hysterectomy (1964); Bladder surgery (1995); Abdominal US (2007); Sclerotherapy varicose veins (5/08); left breast bx (1/15); Breast lumpectomy with needle localization (Left, 11/10/2013); Breast biopsy (Left, 09/2013); Breast biopsy (Left, 12/2014); and Breast lumpectomy (Left, 10/2013)., family history includes Cancer in her brother, brother, father, and sister; Cancer (age of onset: 76) in an other family member; Heart attack in her mother.,  reports that she quit smoking about 23 years ago. Her smoking use included cigarettes. She has a 39.20 pack-year smoking history. She has never used smokeless tobacco. She reports that she does not drink alcohol or use drugs.  She has a current medication list which includes the following prescription(s): accu-chek softclix lancets, acetaminophen, aspirin, onetouch verio, cephalexin, vitamin d, citalopram, conjugated estrogens, cyanocobalamin, famotidine, glipizide, onetouch verio, lisinopril, metformin, metoprolol succinate,  nystatin-triamcinolone ointment, and simvastatin. Also, is allergic to actos [pioglitazone]; alendronate sodium; boniva [ibandronate sodium]; ibandronic acid; lansoprazole; and omeprazole.  Review of Systems  All other systems reviewed and are negative.   Objective: BP (!) 150/90   Ht 5\' 2"  (1.575 m)   Wt 144 lb (65.3 kg)   BMI 26.34 kg/m  Physical Exam Constitutional:      General: She is not in acute distress.    Appearance: She is well-developed.  Genitourinary:     Pelvic exam was performed with patient supine.     Vagina normal.     No vaginal erythema or bleeding.     Genitourinary Comments: Cuff intact/ no lesions Absent uterus and cervix Atrophy present  HENT:     Head: Normocephalic and atraumatic.     Nose: Nose normal.  Abdominal:     General: There is no distension.     Palpations: Abdomen is soft.     Tenderness: There is no abdominal tenderness.  Musculoskeletal: Normal range of motion.  Neurological:     Mental Status: She is alert and oriented to person, place, and time.     Cranial Nerves: No cranial nerve deficit.  Skin:    General: Skin is warm and dry.  Psychiatric:        Attention and Perception: Attention normal.        Mood and Affect: Mood normal.        Speech: Speech normal.        Behavior: Behavior normal.        Cognition and Memory: Cognition normal.        Judgment: Judgment normal.     Pessary Care Pessary removed and cleaned.  Vagina checked - without erosions - new smaller pessary replaced.  A/P:   ICD-10-CM   1. Cystocele, midline  N81.11    Pessary was cleaned and replaced with a smaller #4 size ring pessary today. Instructions given for care.  Risks of expulsion if too small a pessary discussed.  Will save the #5 in case we need to revert back to this size.  Hopefully the smaller size will ead to less irritation.  Can also consider vag ERT to help w atrophy.  She has Rx for this but uses this sparingly. Concerning symptoms  to observe for are counseled to patient. Follow up scheduled for 3 months.  A total of 15 minutes were spent face-to-face with the patient during this encounter and over half of that time dealt with counseling and coordination of care.  Barnett Applebaum, MD, [facility], [facility] 08/06/2019  10:05 AM

## 2019-08-31 ENCOUNTER — Other Ambulatory Visit: Payer: Self-pay

## 2019-08-31 ENCOUNTER — Ambulatory Visit (INDEPENDENT_AMBULATORY_CARE_PROVIDER_SITE_OTHER): Payer: Medicare HMO | Admitting: Obstetrics & Gynecology

## 2019-08-31 ENCOUNTER — Encounter: Payer: Self-pay | Admitting: Obstetrics & Gynecology

## 2019-08-31 VITALS — BP 152/70 | HR 103 | Ht 62.0 in | Wt 148.0 lb

## 2019-08-31 DIAGNOSIS — N952 Postmenopausal atrophic vaginitis: Secondary | ICD-10-CM | POA: Diagnosis not present

## 2019-08-31 DIAGNOSIS — N763 Subacute and chronic vulvitis: Secondary | ICD-10-CM | POA: Diagnosis not present

## 2019-08-31 DIAGNOSIS — R69 Illness, unspecified: Secondary | ICD-10-CM | POA: Diagnosis not present

## 2019-08-31 MED ORDER — CLOTRIMAZOLE-BETAMETHASONE 1-0.05 % EX CREA: 1.0000 "application " | TOPICAL_CREAM | Freq: Two times a day (BID) | CUTANEOUS | 0 refills | Status: DC

## 2019-08-31 NOTE — Patient Instructions (Signed)
Betamethasone; Clotrimazole skin cream What is this medicine? BETAMETHASONE; CLOTRIMAZOLE (bay ta METH a sone; kloe TRIM a zole) is a corticosteroid and antifungal cream. It treats ringworm and infections like jock itch and athlete's foot. It also helps reduce swelling, redness, and itching caused by these infections. This medicine may be used for other purposes; ask your health care provider or pharmacist if you have questions. COMMON BRAND NAME(S): Lotrisone What should I tell my health care provider before I take this medicine? They need to know if you have any of these conditions:  large areas of burned or damaged skin  skin thinning  peripheral vascular disease or poor circulation  an unusual or allergic reaction to betamethasone, clotrimazole, other corticosteroids, other antifungals, other medicines, foods, dyes, or preservatives  pregnant or trying to get pregnant  breast-feeding How should I use this medicine? This cream is for external use only. Do not take by mouth. Follow the directions on the prescription label. Wash your hands before and after use. If treating hand or nail infections, wash hands before use only. Apply a thin layer of cream to the affected area and rub in gently. Do not cover or wrap the treated area with an airtight bandage (like a plastic bandage). Use the cream for the full course of treatment prescribed, even if you think the condition is getting better. Use the medicine at regular intervals. Do not use more often than directed. Do not use on healthy skin or over large areas of skin. Do not use this medicine for any condition other than the one for which it was prescribed. When applying to the groin area, apply a small amount and do not use for longer than 2 weeks unless directed to by your doctor or health care professional. Do not get this cream in your eyes. If you do, rinse out with plenty of cool tap water. Talk to your pediatrician regarding the use of  this medicine in children. While this drug may be prescribed for children as young as 17 years for selected conditions, precautions do apply. Patients over 94 years old may have a stronger reaction and need a smaller dose. Overdosage: If you think you have taken too much of this medicine contact a poison control center or emergency room at once. NOTE: This medicine is only for you. Do not share this medicine with others. What if I miss a dose? If you miss a dose, use it as soon as you can. If it is almost time for your next dose, use only that dose. Do not use double or take extra doses. What may interact with this medicine?  topical products that have nystatin This list may not describe all possible interactions. Give your health care provider a list of all the medicines, herbs, non-prescription drugs, or dietary supplements you use. Also tell them if you smoke, drink alcohol, or use illegal drugs. Some items may interact with your medicine. What should I watch for while using this medicine? If using this medicine on your body or groin tell your doctor or health care professional if your symptoms do not improve within 1 week. If using this medicine on your feet tell your doctor or health care professional if your symptoms do not improve within 2 weeks. Tell your doctor if your skin infection returns after you stop using this cream. If you are using this cream for 'jock itch' be sure to dry the groin completely after bathing. Do not wear underwear that is tight-fitting or  made from synthetic fibers like rayon or nylon. Wear loose-fitting, cotton underwear. If you are using this cream for athlete's foot be sure to dry your feet carefully after bathing, especially between the toes. Do not wear socks made from wool or synthetic materials like rayon or nylon. Wear clean cotton socks and change them at least once a day, change them more if your feet sweat a lot. Also, try to wear sandals or shoes that are  well-ventilated. Do not use this cream to treat diaper rash. What side effects may I notice from receiving this medicine? Side effects that you should report to your doctor or health care professional as soon as possible:  allergic reactions like skin rash, itching or hives, swelling of the face, lips, or tongue  dark red spots on the skin  lack of healing of skin condition  loss of feeling on skin  painful, red, pus-filled blisters in hair follicles  skin infection  sores or blisters that do not heal properly  thinning of the skin or sunburn Side effects that usually do not require medical attention (report to your doctor or health care professional if they continue or are bothersome):  dry or peeling skin  minor skin irritation, burning, or itching This list may not describe all possible side effects. Call your doctor for medical advice about side effects. You may report side effects to FDA at 1-800-FDA-1088. Where should I keep my medicine? Keep out of the reach of children. Store at room temperature between 15 and 30 degrees C ( 59 and 86 degrees F). Do not freeze. Throw away any unused medicine after the expiration date. NOTE: This sheet is a summary. It may not cover all possible information. If you have questions about this medicine, talk to your doctor, pharmacist, or health care provider.  2020 Elsevier/Gold Standard (2007-12-16 16:14:28)

## 2019-08-31 NOTE — Progress Notes (Signed)
HPI:      [name] is a 83 y.o. G5P5 WF No LMP recorded. Patient has had a hysterectomy., presents today for a problem visit.  She complains of:  Vulvar concern:   This is a 83 y.o. old Caucasian/White female who presents for the evaluation of vulvar irritation, burning for a few weeks.  No disharge.  Had pessary changed last month, smaller size.  Has vag ERT prescribed but rarely uses (for vag atrophy).  Not sexually active.  No recent infection or ABX use.  PMHx: She  has a past medical history of Arthritis, Bleeding disorder ([place]), Breast cancer ([place]) (10/11/13 dx), Cardiomyopathy ([place]), Cervical cancer ([place]), Demand ischemia ([place]), Full dentures, GERD (gastroesophageal reflux disease), History of stomach ulcers, Hypertension, Mixed hyperlipidemia, Osteoarthritis, Osteopenia, Stress reaction (2009), Type II diabetes mellitus ([place]), Urinary incontinence, Uterus cancer ([place]), and Varicose veins. Also,  has a past surgical history that includes Abdominal hysterectomy (1964); Bladder surgery (1995); Abdominal US (2007); Sclerotherapy varicose veins (5/08); left breast bx (1/15); Breast lumpectomy with needle localization (Left, 11/10/2013); Breast biopsy (Left, 09/2013); Breast biopsy (Left, 12/2014); and Breast lumpectomy (Left, 10/2013)., family history includes Cancer in her brother, brother, father, and sister; Cancer (age of onset: 69) in an other family member; Heart attack in her mother.,  reports that she quit smoking about 23 years ago. Her smoking use included cigarettes. She has a 39.20 pack-year smoking history. She has never used smokeless tobacco. She reports that she does not drink alcohol or use drugs.  She has a current medication list which includes the following prescription(s): accu-chek softclix lancets, acetaminophen, aspirin, onetouch verio, vitamin d, citalopram, conjugated estrogens, cyanocobalamin, famotidine, glipizide, onetouch verio, lisinopril, metformin, metoprolol  succinate, nystatin-triamcinolone ointment, simvastatin, cephalexin, and clotrimazole-betamethasone. Also, is allergic to actos [pioglitazone]; alendronate sodium; boniva [ibandronate sodium]; ibandronic acid; lansoprazole; and omeprazole.  Review of Systems  Constitutional: Negative for chills, fever and malaise/fatigue.  HENT: Negative for congestion, sinus pain and sore throat.   Eyes: Negative for blurred vision and pain.  Respiratory: Negative for cough and wheezing.   Cardiovascular: Negative for chest pain and leg swelling.  Gastrointestinal: Negative for abdominal pain, constipation, diarrhea, heartburn, nausea and vomiting.  Genitourinary: Negative for dysuria, frequency, hematuria and urgency.  Musculoskeletal: Negative for back pain, joint pain, myalgias and neck pain.  Skin: Negative for itching and rash.  Neurological: Negative for dizziness, tremors and weakness.  Endo/Heme/Allergies: Does not bruise/bleed easily.  Psychiatric/Behavioral: Negative for depression. The patient is not nervous/anxious and does not have insomnia.     Objective: BP (!) 152/70 (BP Location: Right Arm, Patient Position: Sitting, Cuff Size: Normal)   Pulse (!) 103   Ht 5\' 2"  (1.575 m)   Wt 148 lb (67.1 kg)   BMI 27.07 kg/m  Physical Exam Constitutional:      General: She is not in acute distress.    Appearance: She is well-developed.  Genitourinary:     Pelvic exam was performed with patient supine.     Vagina normal.     No vaginal erythema or bleeding.     Genitourinary Comments: Cuff intact/ no lesions  Absent uterus and cervix  HENT:     Head: Normocephalic and atraumatic.     Nose: Nose normal.  Abdominal:     General: There is no distension.     Palpations: Abdomen is soft.     Tenderness: There is no abdominal tenderness.  Musculoskeletal: Normal range of motion.  Neurological:  Mental Status: She is alert and oriented to person, place, and time.     Cranial Nerves: No  cranial nerve deficit.  Skin:    General: Skin is warm and dry.  Psychiatric:        Attention and Perception: Attention normal.        Mood and Affect: Mood normal.        Speech: Speech normal.        Behavior: Behavior normal.        Cognition and Memory: Cognition normal.        Judgment: Judgment normal.    ASSESSMENT/PLAN:    Problem List Items Addressed This Visit      Genitourinary   Vaginal atrophy - Primary   Chronic vulvitis    Lotrisone 7 days Cont pessary use Add in more appropriate vag ERT use (2-3 x weekly)  Barnett Applebaum, MD, [facility] 08/31/2019  4:59 PM

## 2019-09-04 ENCOUNTER — Inpatient Hospital Stay
Admission: EM | Admit: 2019-09-04 | Discharge: 2019-09-13 | DRG: 536 | Disposition: A | Discharge status: Skilled Nursing Facility | Payer: Medicare HMO | Attending: Internal Medicine | Admitting: Internal Medicine

## 2019-09-04 ENCOUNTER — Encounter: Payer: Self-pay | Admitting: Emergency Medicine

## 2019-09-04 ENCOUNTER — Emergency Department: Payer: Medicare HMO

## 2019-09-04 ENCOUNTER — Other Ambulatory Visit: Payer: Self-pay

## 2019-09-04 DIAGNOSIS — R32 Unspecified urinary incontinence: Secondary | ICD-10-CM | POA: Diagnosis present

## 2019-09-04 DIAGNOSIS — I10 Essential (primary) hypertension: Secondary | ICD-10-CM | POA: Diagnosis not present

## 2019-09-04 DIAGNOSIS — N390 Urinary tract infection, site not specified: Secondary | ICD-10-CM | POA: Diagnosis present

## 2019-09-04 DIAGNOSIS — S32599A Other specified fracture of unspecified pubis, initial encounter for closed fracture: Secondary | ICD-10-CM | POA: Diagnosis present

## 2019-09-04 DIAGNOSIS — I429 Cardiomyopathy, unspecified: Secondary | ICD-10-CM | POA: Diagnosis not present

## 2019-09-04 DIAGNOSIS — Z888 Allergy status to other drugs, medicaments and biological substances status: Secondary | ICD-10-CM

## 2019-09-04 DIAGNOSIS — E1165 Type 2 diabetes mellitus with hyperglycemia: Secondary | ICD-10-CM | POA: Diagnosis not present

## 2019-09-04 DIAGNOSIS — Y92009 Unspecified place in unspecified non-institutional (private) residence as the place of occurrence of the external cause: Secondary | ICD-10-CM

## 2019-09-04 DIAGNOSIS — Z79890 Hormone replacement therapy (postmenopausal): Secondary | ICD-10-CM

## 2019-09-04 DIAGNOSIS — Z8249 Family history of ischemic heart disease and other diseases of the circulatory system: Secondary | ICD-10-CM | POA: Diagnosis not present

## 2019-09-04 DIAGNOSIS — S7011XA Contusion of right thigh, initial encounter: Secondary | ICD-10-CM

## 2019-09-04 DIAGNOSIS — Z90710 Acquired absence of both cervix and uterus: Secondary | ICD-10-CM

## 2019-09-04 DIAGNOSIS — N3289 Other specified disorders of bladder: Secondary | ICD-10-CM | POA: Diagnosis present

## 2019-09-04 DIAGNOSIS — Z801 Family history of malignant neoplasm of trachea, bronchus and lung: Secondary | ICD-10-CM

## 2019-09-04 DIAGNOSIS — S32511D Fracture of superior rim of right pubis, subsequent encounter for fracture with routine healing: Secondary | ICD-10-CM | POA: Diagnosis not present

## 2019-09-04 DIAGNOSIS — Z20828 Contact with and (suspected) exposure to other viral communicable diseases: Secondary | ICD-10-CM | POA: Diagnosis not present

## 2019-09-04 DIAGNOSIS — Z803 Family history of malignant neoplasm of breast: Secondary | ICD-10-CM

## 2019-09-04 DIAGNOSIS — E782 Mixed hyperlipidemia: Secondary | ICD-10-CM | POA: Diagnosis not present

## 2019-09-04 DIAGNOSIS — S32591A Other specified fracture of right pubis, initial encounter for closed fracture: Secondary | ICD-10-CM | POA: Diagnosis not present

## 2019-09-04 DIAGNOSIS — S7011XD Contusion of right thigh, subsequent encounter: Secondary | ICD-10-CM | POA: Diagnosis not present

## 2019-09-04 DIAGNOSIS — Z03818 Encounter for observation for suspected exposure to other biological agents ruled out: Secondary | ICD-10-CM | POA: Diagnosis not present

## 2019-09-04 DIAGNOSIS — Z87891 Personal history of nicotine dependence: Secondary | ICD-10-CM | POA: Diagnosis not present

## 2019-09-04 DIAGNOSIS — Z7982 Long term (current) use of aspirin: Secondary | ICD-10-CM

## 2019-09-04 DIAGNOSIS — R2681 Unsteadiness on feet: Secondary | ICD-10-CM | POA: Diagnosis not present

## 2019-09-04 DIAGNOSIS — Z8541 Personal history of malignant neoplasm of cervix uteri: Secondary | ICD-10-CM

## 2019-09-04 DIAGNOSIS — Z8542 Personal history of malignant neoplasm of other parts of uterus: Secondary | ICD-10-CM | POA: Diagnosis not present

## 2019-09-04 DIAGNOSIS — S32501A Unspecified fracture of right pubis, initial encounter for closed fracture: Secondary | ICD-10-CM | POA: Diagnosis not present

## 2019-09-04 DIAGNOSIS — R41841 Cognitive communication deficit: Secondary | ICD-10-CM | POA: Diagnosis not present

## 2019-09-04 DIAGNOSIS — S3289XA Fracture of other parts of pelvis, initial encounter for closed fracture: Secondary | ICD-10-CM | POA: Diagnosis not present

## 2019-09-04 DIAGNOSIS — S0990XA Unspecified injury of head, initial encounter: Secondary | ICD-10-CM | POA: Diagnosis not present

## 2019-09-04 DIAGNOSIS — Z8711 Personal history of peptic ulcer disease: Secondary | ICD-10-CM

## 2019-09-04 DIAGNOSIS — Z9181 History of falling: Secondary | ICD-10-CM

## 2019-09-04 DIAGNOSIS — S3282XA Multiple fractures of pelvis without disruption of pelvic ring, initial encounter for closed fracture: Secondary | ICD-10-CM

## 2019-09-04 DIAGNOSIS — E1169 Type 2 diabetes mellitus with other specified complication: Secondary | ICD-10-CM | POA: Diagnosis not present

## 2019-09-04 DIAGNOSIS — Z853 Personal history of malignant neoplasm of breast: Secondary | ICD-10-CM | POA: Diagnosis not present

## 2019-09-04 DIAGNOSIS — S7001XD Contusion of right hip, subsequent encounter: Secondary | ICD-10-CM | POA: Diagnosis not present

## 2019-09-04 DIAGNOSIS — E785 Hyperlipidemia, unspecified: Secondary | ICD-10-CM | POA: Diagnosis not present

## 2019-09-04 DIAGNOSIS — M2550 Pain in unspecified joint: Secondary | ICD-10-CM | POA: Diagnosis not present

## 2019-09-04 DIAGNOSIS — Z7401 Bed confinement status: Secondary | ICD-10-CM | POA: Diagnosis not present

## 2019-09-04 DIAGNOSIS — W19XXXA Unspecified fall, initial encounter: Secondary | ICD-10-CM

## 2019-09-04 DIAGNOSIS — M8580 Other specified disorders of bone density and structure, unspecified site: Secondary | ICD-10-CM | POA: Diagnosis present

## 2019-09-04 DIAGNOSIS — N898 Other specified noninflammatory disorders of vagina: Secondary | ICD-10-CM | POA: Diagnosis present

## 2019-09-04 DIAGNOSIS — W109XXA Fall (on) (from) unspecified stairs and steps, initial encounter: Secondary | ICD-10-CM | POA: Diagnosis present

## 2019-09-04 DIAGNOSIS — IMO0002 Reserved for concepts with insufficient information to code with codable children: Secondary | ICD-10-CM | POA: Diagnosis present

## 2019-09-04 DIAGNOSIS — M1611 Unilateral primary osteoarthritis, right hip: Secondary | ICD-10-CM | POA: Diagnosis present

## 2019-09-04 DIAGNOSIS — K219 Gastro-esophageal reflux disease without esophagitis: Secondary | ICD-10-CM | POA: Diagnosis not present

## 2019-09-04 DIAGNOSIS — R5381 Other malaise: Secondary | ICD-10-CM | POA: Diagnosis not present

## 2019-09-04 DIAGNOSIS — S32511A Fracture of superior rim of right pubis, initial encounter for closed fracture: Secondary | ICD-10-CM | POA: Diagnosis not present

## 2019-09-04 DIAGNOSIS — Z7984 Long term (current) use of oral hypoglycemic drugs: Secondary | ICD-10-CM

## 2019-09-04 DIAGNOSIS — M25551 Pain in right hip: Secondary | ICD-10-CM | POA: Diagnosis not present

## 2019-09-04 DIAGNOSIS — M6281 Muscle weakness (generalized): Secondary | ICD-10-CM | POA: Diagnosis not present

## 2019-09-04 DIAGNOSIS — Z79899 Other long term (current) drug therapy: Secondary | ICD-10-CM

## 2019-09-04 DIAGNOSIS — S199XXA Unspecified injury of neck, initial encounter: Secondary | ICD-10-CM | POA: Diagnosis not present

## 2019-09-04 LAB — CBC WITH DIFFERENTIAL/PLATELET
Abs Immature Granulocytes: 0.11 10*3/uL — ABNORMAL HIGH (ref 0.00–0.07)
Basophils Absolute: 0.1 10*3/uL (ref 0.0–0.1)
Basophils Relative: 0 %
Eosinophils Absolute: 0.1 10*3/uL (ref 0.0–0.5)
Eosinophils Relative: 0 %
HCT: 35.5 % — ABNORMAL LOW (ref 36.0–46.0)
Hemoglobin: 11.1 g/dL — ABNORMAL LOW (ref 12.0–15.0)
Immature Granulocytes: 1 %
Lymphocytes Relative: 8 %
Lymphs Abs: 1.3 10*3/uL (ref 0.7–4.0)
MCH: 26.6 pg (ref 26.0–34.0)
MCHC: 31.3 g/dL (ref 30.0–36.0)
MCV: 84.9 fL (ref 80.0–100.0)
Monocytes Absolute: 1.1 10*3/uL — ABNORMAL HIGH (ref 0.1–1.0)
Monocytes Relative: 7 %
Neutro Abs: 12.5 10*3/uL — ABNORMAL HIGH (ref 1.7–7.7)
Neutrophils Relative %: 84 %
Platelets: 286 10*3/uL (ref 150–400)
RBC: 4.18 MIL/uL (ref 3.87–5.11)
RDW: 13.9 % (ref 11.5–15.5)
WBC: 15.1 10*3/uL — ABNORMAL HIGH (ref 4.0–10.5)
nRBC: 0 % (ref 0.0–0.2)

## 2019-09-04 LAB — COMPREHENSIVE METABOLIC PANEL
ALT: 14 U/L (ref 0–44)
AST: 16 U/L (ref 15–41)
Albumin: 3.9 g/dL (ref 3.5–5.0)
Alkaline Phosphatase: 55 U/L (ref 38–126)
Anion gap: 11 (ref 5–15)
BUN: 10 mg/dL (ref 8–23)
CO2: 23 mmol/L (ref 22–32)
Calcium: 9.3 mg/dL (ref 8.9–10.3)
Chloride: 97 mmol/L — ABNORMAL LOW (ref 98–111)
Creatinine, Ser: 0.55 mg/dL (ref 0.44–1.00)
GFR calc Af Amer: >60 mL/min (ref >60)
GFR calc non Af Amer: >60 mL/min (ref >60)
Glucose, Bld: 239 mg/dL — ABNORMAL HIGH (ref 70–99)
Potassium: 3.7 mmol/L (ref 3.5–5.1)
Sodium: 131 mmol/L — ABNORMAL LOW (ref 135–145)
Total Bilirubin: 0.6 mg/dL (ref 0.3–1.2)
Total Protein: 7 g/dL (ref 6.5–8.1)

## 2019-09-04 LAB — URINALYSIS, COMPLETE (UACMP) WITH MICROSCOPIC
Bilirubin Urine: NEGATIVE
Glucose, UA: >=500 mg/dL — AB
Ketones, ur: 5 mg/dL — AB
Nitrite: NEGATIVE
Protein, ur: 100 mg/dL — AB
Specific Gravity, Urine: 1.023 (ref 1.005–1.030)
WBC, UA: >50 WBC/hpf — ABNORMAL HIGH (ref 0–5)
pH: 5 (ref 5.0–8.0)

## 2019-09-04 LAB — APTT: aPTT: 26 seconds (ref 24–36)

## 2019-09-04 LAB — PROTIME-INR
INR: 0.9 (ref 0.8–1.2)
Prothrombin Time: 12.4 seconds (ref 11.4–15.2)

## 2019-09-04 LAB — TYPE AND SCREEN
ABO/RH(D): O POS
Antibody Screen: NEGATIVE

## 2019-09-04 MED ORDER — FENTANYL CITRATE (PF) 100 MCG/2ML IJ SOLN
25.0000 ug | Freq: Once | INTRAMUSCULAR | Status: AC
  Administered 2019-09-04: 25 ug via INTRAVENOUS
  Filled 2019-09-04: qty 2

## 2019-09-04 MED ORDER — ACETAMINOPHEN 325 MG PO TABS
650.0000 mg | ORAL_TABLET | Freq: Four times a day (QID) | ORAL | Status: DC | PRN
  Administered 2019-09-10: 650 mg via ORAL
  Filled 2019-09-04: qty 2

## 2019-09-04 MED ORDER — SENNA 8.6 MG PO TABS
1.0000 | ORAL_TABLET | Freq: Two times a day (BID) | ORAL | Status: DC
  Administered 2019-09-04 – 2019-09-13 (×17): 8.6 mg via ORAL
  Filled 2019-09-04 (×17): qty 1

## 2019-09-04 MED ORDER — MORPHINE SULFATE (PF) 2 MG/ML IV SOLN
2.0000 mg | Freq: Once | INTRAVENOUS | Status: AC
  Administered 2019-09-04: 2 mg via INTRAVENOUS
  Filled 2019-09-04: qty 1

## 2019-09-04 MED ORDER — MORPHINE SULFATE (PF) 2 MG/ML IV SOLN
1.0000 mg | INTRAVENOUS | Status: DC | PRN
  Administered 2019-09-06: 1 mg via INTRAVENOUS
  Filled 2019-09-04: qty 1

## 2019-09-04 MED ORDER — POLYETHYLENE GLYCOL 3350 17 G PO PACK
17.0000 g | PACK | Freq: Every day | ORAL | Status: DC | PRN
  Administered 2019-09-06 – 2019-09-10 (×2): 17 g via ORAL
  Filled 2019-09-04 (×2): qty 1

## 2019-09-04 MED ORDER — ONDANSETRON HCL 4 MG/2ML IJ SOLN
4.0000 mg | Freq: Once | INTRAMUSCULAR | Status: AC
  Administered 2019-09-04: 4 mg via INTRAVENOUS
  Filled 2019-09-04: qty 2

## 2019-09-04 MED ORDER — SODIUM CHLORIDE 0.9 % IV BOLUS
500.0000 mL | Freq: Once | INTRAVENOUS | Status: AC
  Administered 2019-09-04: 500 mL via INTRAVENOUS

## 2019-09-04 MED ORDER — HYDROCODONE-ACETAMINOPHEN 5-325 MG PO TABS
1.0000 | ORAL_TABLET | ORAL | Status: DC | PRN
  Administered 2019-09-05: 2 via ORAL
  Administered 2019-09-05: 1 via ORAL
  Administered 2019-09-05 – 2019-09-07 (×5): 2 via ORAL
  Administered 2019-09-08 – 2019-09-09 (×3): 1 via ORAL
  Administered 2019-09-09: 2 via ORAL
  Administered 2019-09-10 – 2019-09-11 (×5): 1 via ORAL
  Administered 2019-09-12: 2 via ORAL
  Administered 2019-09-12: 1 via ORAL
  Administered 2019-09-12 – 2019-09-13 (×2): 2 via ORAL
  Filled 2019-09-04: qty 1
  Filled 2019-09-04: qty 2
  Filled 2019-09-04: qty 1
  Filled 2019-09-04: qty 2
  Filled 2019-09-04: qty 1
  Filled 2019-09-04 (×3): qty 2
  Filled 2019-09-04 (×3): qty 1
  Filled 2019-09-04 (×2): qty 2
  Filled 2019-09-04: qty 1
  Filled 2019-09-04 (×3): qty 2
  Filled 2019-09-04: qty 1
  Filled 2019-09-04: qty 2
  Filled 2019-09-04: qty 1
  Filled 2019-09-04: qty 2
  Filled 2019-09-04 (×2): qty 1

## 2019-09-04 MED ORDER — ACETAMINOPHEN 650 MG RE SUPP: 650.0000 mg | Freq: Four times a day (QID) | RECTAL | Status: DC | PRN

## 2019-09-04 NOTE — ED Triage Notes (Signed)
R hip pain since fall one hour ago. Was able to get to private car with daughters help to come here but states pain with ambulation.
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
## 2019-09-04 NOTE — ED Notes (Addendum)
Pt denies nausea at this time. Daughter at bedside.

## 2019-09-04 NOTE — ED Triage Notes (Addendum)
FIRST NRUSE NOTE-Pt here for right hip pain after mechanical fall.  Difficulty putting weight on right leg. Concern for hip fx.  Pt in pain sitting in chair.
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
## 2019-09-05 ENCOUNTER — Other Ambulatory Visit: Payer: Self-pay

## 2019-09-05 LAB — BASIC METABOLIC PANEL
Anion gap: 11 (ref 5–15)
BUN: 11 mg/dL (ref 8–23)
CO2: 24 mmol/L (ref 22–32)
Calcium: 8.8 mg/dL — ABNORMAL LOW (ref 8.9–10.3)
Chloride: 100 mmol/L (ref 98–111)
Creatinine, Ser: 0.49 mg/dL (ref 0.44–1.00)
GFR calc Af Amer: >60 mL/min (ref >60)
GFR calc non Af Amer: >60 mL/min (ref >60)
Glucose, Bld: 233 mg/dL — ABNORMAL HIGH (ref 70–99)
Potassium: 4.2 mmol/L (ref 3.5–5.1)
Sodium: 135 mmol/L (ref 135–145)

## 2019-09-05 LAB — GLUCOSE, CAPILLARY
Glucose-Capillary: 175 mg/dL — ABNORMAL HIGH (ref 70–99)
Glucose-Capillary: 218 mg/dL — ABNORMAL HIGH (ref 70–99)
Glucose-Capillary: 211 mg/dL — ABNORMAL HIGH (ref 70–99)
Glucose-Capillary: 151 mg/dL — ABNORMAL HIGH (ref 70–99)

## 2019-09-05 LAB — CBC
HCT: 29.1 % — ABNORMAL LOW (ref 36.0–46.0)
Hemoglobin: 9.2 g/dL — ABNORMAL LOW (ref 12.0–15.0)
MCH: 26.8 pg (ref 26.0–34.0)
MCHC: 31.6 g/dL (ref 30.0–36.0)
MCV: 84.8 fL (ref 80.0–100.0)
Platelets: 251 10*3/uL (ref 150–400)
RBC: 3.43 MIL/uL — ABNORMAL LOW (ref 3.87–5.11)
RDW: 14 % (ref 11.5–15.5)
WBC: 9.9 10*3/uL (ref 4.0–10.5)
nRBC: 0 % (ref 0.0–0.2)

## 2019-09-05 LAB — HEMOGLOBIN A1C
Hgb A1c MFr Bld: 8.4 % — ABNORMAL HIGH (ref 4.8–5.6)
Mean Plasma Glucose: 194.38 mg/dL

## 2019-09-05 LAB — SARS CORONAVIRUS 2 (TAT 6-24 HRS): SARS Coronavirus 2: NEGATIVE

## 2019-09-05 MED ORDER — INSULIN ASPART 100 UNIT/ML ~~LOC~~ SOLN
0.0000 [IU] | Freq: Three times a day (TID) | SUBCUTANEOUS | Status: DC
  Administered 2019-09-05: 2 [IU] via SUBCUTANEOUS
  Administered 2019-09-05 (×2): 3 [IU] via SUBCUTANEOUS
  Administered 2019-09-06: 2 [IU] via SUBCUTANEOUS
  Administered 2019-09-06: 3 [IU] via SUBCUTANEOUS
  Administered 2019-09-06: 2 [IU] via SUBCUTANEOUS
  Administered 2019-09-07: 5 [IU] via SUBCUTANEOUS
  Administered 2019-09-07: 2 [IU] via SUBCUTANEOUS
  Administered 2019-09-08: 5 [IU] via SUBCUTANEOUS
  Administered 2019-09-08: 2 [IU] via SUBCUTANEOUS
  Administered 2019-09-09: 3 [IU] via SUBCUTANEOUS
  Administered 2019-09-09: 5 [IU] via SUBCUTANEOUS
  Administered 2019-09-10: 1 [IU] via SUBCUTANEOUS
  Administered 2019-09-10: 7 [IU] via SUBCUTANEOUS
  Administered 2019-09-10: 2 [IU] via SUBCUTANEOUS
  Administered 2019-09-11: 5 [IU] via SUBCUTANEOUS
  Administered 2019-09-11: 2 [IU] via SUBCUTANEOUS
  Administered 2019-09-12 (×2): 1 [IU] via SUBCUTANEOUS
  Administered 2019-09-12 – 2019-09-13 (×2): 3 [IU] via SUBCUTANEOUS
  Administered 2019-09-13: 1 [IU] via SUBCUTANEOUS
  Filled 2019-09-05 (×23): qty 1

## 2019-09-05 MED ORDER — CEPHALEXIN 500 MG PO CAPS
500.0000 mg | ORAL_CAPSULE | Freq: Two times a day (BID) | ORAL | Status: AC
  Administered 2019-09-05 – 2019-09-07 (×6): 500 mg via ORAL
  Filled 2019-09-05 (×6): qty 1

## 2019-09-05 MED ORDER — METOPROLOL SUCCINATE ER 50 MG PO TB24
50.0000 mg | ORAL_TABLET | Freq: Every day | ORAL | Status: DC
  Administered 2019-09-05 – 2019-09-13 (×9): 50 mg via ORAL
  Filled 2019-09-05 (×9): qty 1

## 2019-09-05 MED ORDER — ESTROGENS, CONJUGATED 0.625 MG/GM VA CREA
1.0000 | TOPICAL_CREAM | VAGINAL | Status: DC
  Administered 2019-09-06 – 2019-09-10 (×3): 1 via VAGINAL
  Filled 2019-09-05 (×2): qty 30

## 2019-09-05 MED ORDER — GLIPIZIDE ER 10 MG PO TB24
10.0000 mg | ORAL_TABLET | Freq: Every day | ORAL | Status: DC
  Administered 2019-09-07 – 2019-09-13 (×7): 10 mg via ORAL
  Filled 2019-09-05 (×8): qty 1

## 2019-09-05 MED ORDER — SODIUM CHLORIDE 0.9 % IV SOLN
1.0000 g | INTRAVENOUS | Status: DC
  Administered 2019-09-05: 1 g via INTRAVENOUS
  Filled 2019-09-05: qty 10
  Filled 2019-09-05: qty 1

## 2019-09-05 MED ORDER — ASPIRIN EC 81 MG PO TBEC
81.0000 mg | DELAYED_RELEASE_TABLET | Freq: Every day | ORAL | Status: DC
  Administered 2019-09-05 – 2019-09-13 (×9): 81 mg via ORAL
  Filled 2019-09-05 (×9): qty 1

## 2019-09-05 MED ORDER — SODIUM CHLORIDE 0.9 % IV SOLN
INTRAVENOUS | Status: DC | PRN
  Administered 2019-09-05: 250 mL via INTRAVENOUS

## 2019-09-05 MED ORDER — FAMOTIDINE 20 MG PO TABS
40.0000 mg | ORAL_TABLET | Freq: Every day | ORAL | Status: DC
  Administered 2019-09-05 – 2019-09-13 (×9): 40 mg via ORAL
  Filled 2019-09-05 (×9): qty 2

## 2019-09-05 MED ORDER — SIMVASTATIN 20 MG PO TABS
20.0000 mg | ORAL_TABLET | Freq: Every evening | ORAL | Status: DC
  Administered 2019-09-05 – 2019-09-12 (×7): 20 mg via ORAL
  Filled 2019-09-05 (×9): qty 1

## 2019-09-05 MED ORDER — VITAMIN B-12 1000 MCG PO TABS
1000.0000 ug | ORAL_TABLET | Freq: Every day | ORAL | Status: DC
  Administered 2019-09-05 – 2019-09-13 (×9): 1000 ug via ORAL
  Filled 2019-09-05 (×9): qty 1

## 2019-09-05 MED ORDER — METFORMIN HCL 500 MG PO TABS
1000.0000 mg | ORAL_TABLET | Freq: Two times a day (BID) | ORAL | Status: DC
  Administered 2019-09-05 – 2019-09-13 (×14): 1000 mg via ORAL
  Filled 2019-09-05 (×16): qty 2

## 2019-09-05 MED ORDER — LISINOPRIL 20 MG PO TABS
40.0000 mg | ORAL_TABLET | Freq: Every day | ORAL | Status: DC
  Administered 2019-09-05 – 2019-09-13 (×9): 40 mg via ORAL
  Filled 2019-09-05 (×9): qty 2

## 2019-09-05 MED ORDER — CITALOPRAM HYDROBROMIDE 20 MG PO TABS
20.0000 mg | ORAL_TABLET | Freq: Every day | ORAL | Status: DC
  Administered 2019-09-05 – 2019-09-13 (×9): 20 mg via ORAL
  Filled 2019-09-05 (×9): qty 1

## 2019-09-05 NOTE — Progress Notes (Signed)
Physical Therapy Evaluation Patient Details Name: Laura Bell MRN: [id number] DOB: October 16, 1935 Today's Date: 09/05/2019   History of Present Illness  NEHAL KOLESZAR is a 83 y.o. female with medical history significant of history of breast and cervical cancer, hypertension, type 2 diabetes who presents for concerns of a fall.  Patient was going down the stairs today and thinks she might of missed a step and fell Right sided superior and inferior pubic rami factures  Clinical Impression  Patient was apprehensive about moving but was surprised that she was able to tolerate standing. She performed bed mobility with max assist supine<>sit, and transfers sit<> stand with mod assist and RW. She was able to stand for several minutes and take 2 small step to the left but not able to take any steps fwd with RW. She has weakness in LLE 2/5 and increased pain to LLE. She will continue to benefit from skilled PT to improve mobility and strength.     Follow Up Recommendations SNF    Equipment Recommendations  Rolling walker with 5" wheels    Recommendations for Other Services       Precautions / Restrictions Precautions Precautions: Fall Restrictions Weight Bearing Restrictions: Yes RLE Weight Bearing: Weight bearing as tolerated      Mobility  Bed Mobility Overal bed mobility: Needs Assistance Bed Mobility: Supine to Sit;Sit to Supine     Supine to sit: Mod assist Sit to supine: Mod assist   General bed mobility comments: needs assist to scoot to edge and to raise up her trunk  Transfers Overall transfer level: Needs assistance Equipment used: Rolling walker (2 wheeled) Transfers: Sit to/from Stand Sit to Stand: Mod assist         General transfer comment: sit to stand with eleveated bed and mod assist  Ambulation/Gait Ambulation/Gait assistance: Mod assist Gait Distance (Feet): (2 side steps to head of bed, unble to take a step fwd) Assistive device: Rolling walker  (2 wheeled)          Stairs            Wheelchair Mobility    Modified Rankin (Stroke Patients Only)       Balance Overall balance assessment: Mild deficits observed, not formally tested                                           Pertinent Vitals/Pain Pain Assessment: 0-10 Pain Score: 5 (left hip) Pain Descriptors / Indicators: Aching Pain Intervention(s): Monitored during session    Home Living Family/patient expects to be discharged to:: Private residence Living Arrangements: Alone Available Help at Discharge: Family Type of Home: House Home Access: Stairs to enter   Technical brewer of Steps: 2 Home Layout: One level        Prior Function                 Hand Dominance        Extremity/Trunk Assessment   Upper Extremity Assessment Upper Extremity Assessment: Overall WFL for tasks assessed    Lower Extremity Assessment Lower Extremity Assessment: LLE deficits/detail LLE Deficits / Details: (2/5 strength hip , knee 3/5)       Communication   Communication: No difficulties  Cognition Arousal/Alertness: Awake/alert Behavior During Therapy: WFL for tasks assessed/performed Overall Cognitive Status: Within Functional Limits for tasks assessed  General Comments      Exercises     Assessment/Plan    PT Assessment Patient needs continued PT services  PT Problem List Decreased strength;Decreased activity tolerance;Decreased balance;Pain       PT Treatment Interventions Gait training;Therapeutic activities;Therapeutic exercise;Balance training    PT Goals (Current goals can be found in the Care Plan section)  Acute Rehab PT Goals Patient Stated Goal: to walk PT Goal Formulation: With patient Time For Goal Achievement: 09/19/19 Potential to Achieve Goals: Good    Frequency 7X/week   Barriers to discharge        Co-evaluation                AM-PAC PT "6 Clicks" Mobility  Outcome Measure Help needed turning from your back to your side while in a flat bed without using bedrails?: A Lot Help needed moving from lying on your back to sitting on the side of a flat bed without using bedrails?: A Lot Help needed moving to and from a bed to a chair (including a wheelchair)?: A Lot Help needed standing up from a chair using your arms (e.g., wheelchair or bedside chair)?: Total Help needed to walk in hospital room?: Total Help needed climbing 3-5 steps with a railing? : Total 6 Click Score: 9    End of Session Equipment Utilized During Treatment: Gait belt Activity Tolerance: Patient tolerated treatment well;Patient limited by fatigue;Patient limited by pain Patient left: in bed;with bed alarm set Nurse Communication: Mobility status PT Visit Diagnosis: Unsteadiness on feet (R26.81);Repeated falls (R29.6);Muscle weakness (generalized) (M62.81);Difficulty in walking, not elsewhere classified (R26.2)    Time: [id number] PT Time Calculation (min) (ACUTE ONLY): 25 min   Charges:   PT Evaluation $PT Eval Low Complexity: 1 Low PT Treatments $Therapeutic Activity: 8-22 mins          Alanson Puls, PT DPT 09/05/2019, 1:49 PM

## 2019-09-05 NOTE — Plan of Care (Signed)
  Problem: Coping: Goal: Level of anxiety will decrease Outcome: Progressing   Problem: Coping: Goal: Level of anxiety will decrease Outcome: Progressing   

## 2019-09-05 NOTE — Progress Notes (Signed)
[facility] [place] NAME: Laura Bell    MR#:  [id number]  DATE OF BIRTH:  24-Apr-1936  SUBJECTIVE:   Patient came in after mechanical fall from stairs yesterday while she was at her daughter's house. Ate breakfast this morning. Has some right hip pain. No family in the room REVIEW OF SYSTEMS:   Review of Systems  Constitutional: Negative for chills, fever and weight loss.  HENT: Negative for ear discharge, ear pain and nosebleeds.   Eyes: Negative for blurred vision, pain and discharge.  Respiratory: Negative for sputum production, shortness of breath, wheezing and stridor.   Cardiovascular: Negative for chest pain, palpitations, orthopnea and PND.  Gastrointestinal: Negative for abdominal pain, diarrhea, nausea and vomiting.  Genitourinary: Negative for frequency and urgency.  Musculoskeletal: Positive for back pain, falls and joint pain.  Neurological: Positive for weakness. Negative for sensory change, speech change and focal weakness.  Psychiatric/Behavioral: Negative for depression and hallucinations. The patient is not nervous/anxious.    Tolerating Diet:yes Tolerating PT: pending  DRUG ALLERGIES:   Allergies  Allergen Reactions   Actos [Pioglitazone]     Fatigue/pedal edema/exercise intol and palpitations   Alendronate Sodium     GI side eff   Boniva [Ibandronate Sodium]     GI side eff   Ibandronic Acid Hives    GI side eff   Lansoprazole    Omeprazole Itching    ? Itching     VITALS:  Blood pressure 114/66, pulse 95, temperature (!) 97.5 F (36.4 C), temperature source Oral, resp. rate 16, height 5\' 2"  (1.575 m), weight 65.8 kg, SpO2 94 %.  PHYSICAL EXAMINATION:   Physical Exam  GENERAL:  83 y.o.-year-old patient lying in the bed with no acute distress.  EYES: Pupils equal, round, reactive to light and accommodation. No scleral icterus. Extraocular muscles intact.  HEENT: Head atraumatic,  normocephalic. Oropharynx and nasopharynx clear.  NECK:  Supple, no jugular venous distention. No thyroid enlargement, no tenderness.  LUNGS: Normal breath sounds bilaterally, no wheezing, rales, rhonchi. No use of accessory muscles of respiration.  CARDIOVASCULAR: S1, S2 normal. No murmurs, rubs, or gallops.  ABDOMEN: Soft, nontender, nondistended. Bowel sounds present. No organomegaly or mass.  EXTREMITIES: No cyanosis, clubbing or edema b/l.      NEUROLOGIC: Cranial nerves II through XII are intact. No focal Motor or sensory deficits b/l.   PSYCHIATRIC:  patient is alert and oriented x 3.  SKIN:as above  LABORATORY PANEL:  CBC Recent Labs  Lab 09/05/19 0524  WBC 9.9  HGB 9.2*  HCT 29.1*  PLT 251    Chemistries  Recent Labs  Lab 09/04/19 1646 09/05/19 0524  NA 131* 135  K 3.7 4.2  CL 97* 100  CO2 23 24  GLUCOSE 239* 233*  BUN 10 11  CREATININE 0.55 0.49  CALCIUM 9.3 8.8*  AST 16  --   ALT 14  --   ALKPHOS 55  --   BILITOT 0.6  --    Cardiac Enzymes No results for input(s): TROPONINI in the last 168 hours. RADIOLOGY:  Dg Pelvis 1-2 Views  Result Date: 09/04/2019 CLINICAL DATA:  Status post fall. Pain in right lower groin area. EXAM: PELVIS - 1-2 VIEW COMPARISON:  CT AP 03/01/2019 FINDINGS: Acute comminuted fracture deformities involve the right superior and inferior pubic rami. There is moderate to advanced osteoarthritis involving the right hip. Mild degenerative changes are noted involving the left hip. IMPRESSION: 1. Acute comminuted  fracture deformities involve the right superior and inferior pubic rami. 2. Moderate to advanced right hip osteoarthritis. 3. Mild left hip osteoarthritis. Electronically Signed   By: Kerby Moors M.D.   On: 09/04/2019 16:02   Ct Head Wo Contrast  Result Date: 09/04/2019 CLINICAL DATA:  Mechanical fall, head trauma EXAM: CT HEAD WITHOUT CONTRAST CT CERVICAL SPINE WITHOUT CONTRAST TECHNIQUE: Multidetector CT imaging of the head and  cervical spine was performed following the standard protocol without intravenous contrast. Multiplanar CT image reconstructions of the cervical spine were also generated. COMPARISON:  CT cervical spine 10/04/2007, CT head 05/31/2019 FINDINGS: CT HEAD FINDINGS Brain: Generalized atrophy. Normal ventricular morphology. No midline shift or mass effect. Small vessel chronic ischemic changes of deep cerebral white matter. No intracranial hemorrhage, mass lesion, evidence of acute infarction, or extra-axial fluid collection. Vascular: Atherosclerotic calcifications of internal carotid arteries bilaterally at skull base Skull: Demineralized but intact Sinuses/Orbits: Clear Other: N/A CT CERVICAL SPINE FINDINGS Alignment: Minimal retrolisthesis at C5-C6 little changed. Minimal anterolisthesis C4-C5, little changed. Remaining alignments normal. Skull base and vertebrae: Osseous demineralization. Skull base intact. Scattered multilevel facet degenerative changes. Vertebral body heights maintained without fracture or bone destruction. Disc space narrowing C4-C5 and C5-C6. Degenerative changes at anterior C1-C2. Soft tissues and spinal canal: Prevertebral soft tissues normal thickness. Atherosclerotic calcifications at the carotid bifurcations bilaterally, at proximal great vessels, and cranial aspect of aortic arch. 12 mm LEFT thyroid nodule, unchanged. Disc levels:  No specific abnormalities Upper chest: Lung apices clear Other: N/A IMPRESSION: Atrophy with small vessel chronic ischemic changes of deep cerebral white matter. No acute intracranial abnormalities. Osseous demineralization with degenerative disc and facet disease changes of the cervical spine. No acute cervical spine abnormalities. 12 mm nonspecific LEFT thyroid nodule unchanged since 2009; no follow-up recommended.(Ref: J Am Coll Radiol. 2015 Feb;12(2): 143-50). Electronically Signed   By: Lavonia Dana M.D.   On: 09/04/2019 17:54   Ct Cervical Spine Wo  Contrast  Result Date: 09/04/2019 CLINICAL DATA:  Mechanical fall, head trauma EXAM: CT HEAD WITHOUT CONTRAST CT CERVICAL SPINE WITHOUT CONTRAST TECHNIQUE: Multidetector CT imaging of the head and cervical spine was performed following the standard protocol without intravenous contrast. Multiplanar CT image reconstructions of the cervical spine were also generated. COMPARISON:  CT cervical spine 10/04/2007, CT head 05/31/2019 FINDINGS: CT HEAD FINDINGS Brain: Generalized atrophy. Normal ventricular morphology. No midline shift or mass effect. Small vessel chronic ischemic changes of deep cerebral white matter. No intracranial hemorrhage, mass lesion, evidence of acute infarction, or extra-axial fluid collection. Vascular: Atherosclerotic calcifications of internal carotid arteries bilaterally at skull base Skull: Demineralized but intact Sinuses/Orbits: Clear Other: N/A CT CERVICAL SPINE FINDINGS Alignment: Minimal retrolisthesis at C5-C6 little changed. Minimal anterolisthesis C4-C5, little changed. Remaining alignments normal. Skull base and vertebrae: Osseous demineralization. Skull base intact. Scattered multilevel facet degenerative changes. Vertebral body heights maintained without fracture or bone destruction. Disc space narrowing C4-C5 and C5-C6. Degenerative changes at anterior C1-C2. Soft tissues and spinal canal: Prevertebral soft tissues normal thickness. Atherosclerotic calcifications at the carotid bifurcations bilaterally, at proximal great vessels, and cranial aspect of aortic arch. 12 mm LEFT thyroid nodule, unchanged. Disc levels:  No specific abnormalities Upper chest: Lung apices clear Other: N/A IMPRESSION: Atrophy with small vessel chronic ischemic changes of deep cerebral white matter. No acute intracranial abnormalities. Osseous demineralization with degenerative disc and facet disease changes of the cervical spine. No acute cervical spine abnormalities. 12 mm nonspecific LEFT thyroid  nodule unchanged since 2009; no follow-up  recommended.(Ref: J Am Coll Radiol. 2015 Feb;12(2): 143-50). Electronically Signed   By: Lavonia Dana M.D.   On: 09/04/2019 17:54   Ct Pelvis Wo Contrast  Result Date: 09/04/2019 CLINICAL DATA:  [place]. Right hip and leg pain. EXAM: CT OF THE LOWER RIGHT EXTREMITY WITHOUT CONTRAST, CT PELVIS WITHOUT CONTRAST TECHNIQUE: Multidetector CT imaging of the pelvis and right lower extremity was performed according to the standard protocol. COMPARISON:  Radiographs, same date. FINDINGS: Both hips are normally located. There are advanced right hip joint degenerative changes and moderate left hip joint degenerative changes. Joint space narrowing, osteophytic spurring and subchondral cystic change right much greater than left. No acute hip fracture or evidence of AVN. As demonstrated on the radiographs there are right-sided superior and inferior pubic rami fractures. No other pelvic fractures are identified. Moderate degenerative changes at the pubic symphysis. The SI joints are intact. There is a moderate-sized extraperitoneal pelvic hematoma on the right side with mild mass effect on the right side of the bladder. No obvious intramuscular hematomas involving the right abductor muscles. The right femur is intact. No femur fracture. There is a large diffuse subcutaneous hematoma involving the right lateral upper thigh. No underlying intramuscular hematoma. The right knee joint is intact. Moderate degenerative changes. No joint effusion. Moderate to advanced atherosclerotic calcifications involving the aorta iliac arteries. A pessary ring is noted in the vagina. IMPRESSION: 1. Right-sided superior and inferior pubic rami fractures. 2. No other pelvic fractures are identified. 3. Moderate-sized extraperitoneal pelvic hematoma on the right side with mild mass effect on the right side of the bladder. 4. Large diffuse subcutaneous hematoma involving the right lateral upper thigh. 5.  Advanced right hip joint degenerative changes and moderate left hip joint degenerative changes. 6. No femur fracture. Electronically Signed   By: Marijo Sanes M.D.   On: 09/04/2019 17:44   Ct Femur Right Wo Contrast  Result Date: 09/04/2019 CLINICAL DATA:  [place]. Right hip and leg pain. EXAM: CT OF THE LOWER RIGHT EXTREMITY WITHOUT CONTRAST, CT PELVIS WITHOUT CONTRAST TECHNIQUE: Multidetector CT imaging of the pelvis and right lower extremity was performed according to the standard protocol. COMPARISON:  Radiographs, same date. FINDINGS: Both hips are normally located. There are advanced right hip joint degenerative changes and moderate left hip joint degenerative changes. Joint space narrowing, osteophytic spurring and subchondral cystic change right much greater than left. No acute hip fracture or evidence of AVN. As demonstrated on the radiographs there are right-sided superior and inferior pubic rami fractures. No other pelvic fractures are identified. Moderate degenerative changes at the pubic symphysis. The SI joints are intact. There is a moderate-sized extraperitoneal pelvic hematoma on the right side with mild mass effect on the right side of the bladder. No obvious intramuscular hematomas involving the right abductor muscles. The right femur is intact. No femur fracture. There is a large diffuse subcutaneous hematoma involving the right lateral upper thigh. No underlying intramuscular hematoma. The right knee joint is intact. Moderate degenerative changes. No joint effusion. Moderate to advanced atherosclerotic calcifications involving the aorta iliac arteries. A pessary ring is noted in the vagina. IMPRESSION: 1. Right-sided superior and inferior pubic rami fractures. 2. No other pelvic fractures are identified. 3. Moderate-sized extraperitoneal pelvic hematoma on the right side with mild mass effect on the right side of the bladder. 4. Large diffuse subcutaneous hematoma involving the right lateral  upper thigh. 5. Advanced right hip joint degenerative changes and moderate left hip joint degenerative changes. 6. No femur  fracture. Electronically Signed   By: Marijo Sanes M.D.   On: 09/04/2019 17:44   ASSESSMENT AND PLAN:  Laura Bell is a 83 y.o. female with medical history significant of history of breast and cervical cancer, hypertension, type 2 diabetes who presents for concerns of a fall.  Patient was going down the stairs today and thinks she might of missed a step and fell.  Granddaughter witnessed it and try to catch her but could not.  *Right sided superior and inferior pubic rami fractures with right hip/thigh hematoma Moderate sized extraperitoneal pelvic hematoma and Large diffuse subcutaneous hematoma of the right lateral upper thigh S/p Mechanical Fall  - appreciate Dr. Sabra Heck of orthopedics--recommend following serial CBC and monitoring for any skin changes around her hematoma - CBC currently stable at 11.1--9.2 -Pain management- Tylenol PRN for mild pain, hydrocodone PRN  for moderate and morphine PRN for severe - PT/OT to see pt - case management consult for discharge planning  UTI -notes increase urinary frequency and complains of dysuria. No fever. Had white count of 15.1 - UA showed moderate leukocytes and many WBC - IV Rocephin-- Keflex PO x three days  Hypertension -Continue lisinopril, metoprolol   Type 2 diabetes -Sensitive SSI -resume glipizide xl, metformin  Hyperlipidemia -continue statin  Vaginal irritation - continue premarin cream  GERD -continue PPI  DVT prophylaxis: Lovenox Code Status: full  family Communication: Plan discussed with patient at bedside and daughter Altha Harm on the phone disposition Plan: pending PT eval  consults called: orthopedic Dr. Sabra Heck   TOTAL TIME TAKING CARE OF THIS PATIENT: *30* minutes.  >50% time spent on counselling and coordination of care  POSSIBLE D/C IN *1-2 DAYS, DEPENDING ON CLINICAL  CONDITION.  Note: This dictation was prepared with Dragon dictation along with smaller phrase technology. Any transcriptional errors that result from this process are unintentional.  Fritzi Mandes M.D on 09/05/2019 at 12:56 PM  Between 7am to 6pm - Pager - [phone]  After 6pm go to [email or web address]  [facility]   CC: Primary care physician; Tower, Wynelle Fanny, MDPatient ID: Laura Bell, female   DOB: 02-17-1936, 83 y.o.   MRN: [id number]
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
## 2019-09-06 LAB — GLUCOSE, CAPILLARY
Glucose-Capillary: 197 mg/dL — ABNORMAL HIGH (ref 70–99)
Glucose-Capillary: 200 mg/dL — ABNORMAL HIGH (ref 70–99)
Glucose-Capillary: 208 mg/dL — ABNORMAL HIGH (ref 70–99)
Glucose-Capillary: 195 mg/dL — ABNORMAL HIGH (ref 70–99)

## 2019-09-06 LAB — HEMOGLOBIN: Hemoglobin: 9.7 g/dL — ABNORMAL LOW (ref 12.0–15.0)

## 2019-09-06 NOTE — Progress Notes (Signed)
[facility] [place] NAME: Arnesha Mollison    MR#:  [id number]  DATE OF BIRTH:  12/11/1935  SUBJECTIVE:   Patient came in after mechanical fall from stairs yesterday while she was at her daughter's house.    Has some right hip pain. Slept well No family in the room REVIEW OF SYSTEMS:   Review of Systems  Constitutional: Negative for chills, fever and weight loss.  HENT: Negative for ear discharge, ear pain and nosebleeds.   Eyes: Negative for blurred vision, pain and discharge.  Respiratory: Negative for sputum production, shortness of breath, wheezing and stridor.   Cardiovascular: Negative for chest pain, palpitations, orthopnea and PND.  Gastrointestinal: Negative for abdominal pain, diarrhea, nausea and vomiting.  Genitourinary: Negative for frequency and urgency.  Musculoskeletal: Positive for back pain, falls and joint pain.  Neurological: Positive for weakness. Negative for sensory change, speech change and focal weakness.  Psychiatric/Behavioral: Negative for depression and hallucinations. The patient is not nervous/anxious.    Tolerating Diet:yes Tolerating PT: rehab  DRUG ALLERGIES:   Allergies  Allergen Reactions   Actos [Pioglitazone]     Fatigue/pedal edema/exercise intol and palpitations   Alendronate Sodium     GI side eff   Boniva [Ibandronate Sodium]     GI side eff   Ibandronic Acid Hives    GI side eff   Lansoprazole    Omeprazole Itching    ? Itching     VITALS:  Blood pressure (!) 154/71, pulse 94, temperature 98.2 F (36.8 C), temperature source Oral, resp. rate 16, height 5\' 2"  (1.575 m), weight 65.8 kg, SpO2 95 %.  PHYSICAL EXAMINATION:   Physical Exam  GENERAL:  83 y.o.-year-old patient lying in the bed with no acute distress.  EYES: Pupils equal, round, reactive to light and accommodation. No scleral icterus. Extraocular muscles intact.  HEENT: Head atraumatic, normocephalic. Oropharynx  and nasopharynx clear.  NECK:  Supple, no jugular venous distention. No thyroid enlargement, no tenderness.  LUNGS: Normal breath sounds bilaterally, no wheezing, rales, rhonchi. No use of accessory muscles of respiration.  CARDIOVASCULAR: S1, S2 normal. No murmurs, rubs, or gallops.  ABDOMEN: Soft, nontender, nondistended. Bowel sounds present. No organomegaly or mass.  EXTREMITIES: No cyanosis, clubbing or edema b/l.      NEUROLOGIC: Cranial nerves II through XII are intact. No focal Motor or sensory deficits b/l.   PSYCHIATRIC:  patient is alert and oriented x 3.  SKIN:as above  LABORATORY PANEL:  CBC Recent Labs  Lab 09/05/19 0524 09/06/19 0753  WBC 9.9  --   HGB 9.2* 9.7*  HCT 29.1*  --   PLT 251  --     Chemistries  Recent Labs  Lab 09/04/19 1646 09/05/19 0524  NA 131* 135  K 3.7 4.2  CL 97* 100  CO2 23 24  GLUCOSE 239* 233*  BUN 10 11  CREATININE 0.55 0.49  CALCIUM 9.3 8.8*  AST 16  --   ALT 14  --   ALKPHOS 55  --   BILITOT 0.6  --    Cardiac Enzymes No results for input(s): TROPONINI in the last 168 hours. RADIOLOGY:  Dg Pelvis 1-2 Views  Result Date: 09/04/2019 CLINICAL DATA:  Status post fall. Pain in right lower groin area. EXAM: PELVIS - 1-2 VIEW COMPARISON:  CT AP 03/01/2019 FINDINGS: Acute comminuted fracture deformities involve the right superior and inferior pubic rami. There is moderate to advanced osteoarthritis involving the right hip.  Mild degenerative changes are noted involving the left hip. IMPRESSION: 1. Acute comminuted fracture deformities involve the right superior and inferior pubic rami. 2. Moderate to advanced right hip osteoarthritis. 3. Mild left hip osteoarthritis. Electronically Signed   By: Kerby Moors M.D.   On: 09/04/2019 16:02   Ct Head Wo Contrast  Result Date: 09/04/2019 CLINICAL DATA:  Mechanical fall, head trauma EXAM: CT HEAD WITHOUT CONTRAST CT CERVICAL SPINE WITHOUT CONTRAST TECHNIQUE: Multidetector CT imaging of the  head and cervical spine was performed following the standard protocol without intravenous contrast. Multiplanar CT image reconstructions of the cervical spine were also generated. COMPARISON:  CT cervical spine 10/04/2007, CT head 05/31/2019 FINDINGS: CT HEAD FINDINGS Brain: Generalized atrophy. Normal ventricular morphology. No midline shift or mass effect. Small vessel chronic ischemic changes of deep cerebral white matter. No intracranial hemorrhage, mass lesion, evidence of acute infarction, or extra-axial fluid collection. Vascular: Atherosclerotic calcifications of internal carotid arteries bilaterally at skull base Skull: Demineralized but intact Sinuses/Orbits: Clear Other: N/A CT CERVICAL SPINE FINDINGS Alignment: Minimal retrolisthesis at C5-C6 little changed. Minimal anterolisthesis C4-C5, little changed. Remaining alignments normal. Skull base and vertebrae: Osseous demineralization. Skull base intact. Scattered multilevel facet degenerative changes. Vertebral body heights maintained without fracture or bone destruction. Disc space narrowing C4-C5 and C5-C6. Degenerative changes at anterior C1-C2. Soft tissues and spinal canal: Prevertebral soft tissues normal thickness. Atherosclerotic calcifications at the carotid bifurcations bilaterally, at proximal great vessels, and cranial aspect of aortic arch. 12 mm LEFT thyroid nodule, unchanged. Disc levels:  No specific abnormalities Upper chest: Lung apices clear Other: N/A IMPRESSION: Atrophy with small vessel chronic ischemic changes of deep cerebral white matter. No acute intracranial abnormalities. Osseous demineralization with degenerative disc and facet disease changes of the cervical spine. No acute cervical spine abnormalities. 12 mm nonspecific LEFT thyroid nodule unchanged since 2009; no follow-up recommended.(Ref: J Am Coll Radiol. 2015 Feb;12(2): 143-50). Electronically Signed   By: Lavonia Dana M.D.   On: 09/04/2019 17:54   Ct Cervical Spine Wo  Contrast  Result Date: 09/04/2019 CLINICAL DATA:  Mechanical fall, head trauma EXAM: CT HEAD WITHOUT CONTRAST CT CERVICAL SPINE WITHOUT CONTRAST TECHNIQUE: Multidetector CT imaging of the head and cervical spine was performed following the standard protocol without intravenous contrast. Multiplanar CT image reconstructions of the cervical spine were also generated. COMPARISON:  CT cervical spine 10/04/2007, CT head 05/31/2019 FINDINGS: CT HEAD FINDINGS Brain: Generalized atrophy. Normal ventricular morphology. No midline shift or mass effect. Small vessel chronic ischemic changes of deep cerebral white matter. No intracranial hemorrhage, mass lesion, evidence of acute infarction, or extra-axial fluid collection. Vascular: Atherosclerotic calcifications of internal carotid arteries bilaterally at skull base Skull: Demineralized but intact Sinuses/Orbits: Clear Other: N/A CT CERVICAL SPINE FINDINGS Alignment: Minimal retrolisthesis at C5-C6 little changed. Minimal anterolisthesis C4-C5, little changed. Remaining alignments normal. Skull base and vertebrae: Osseous demineralization. Skull base intact. Scattered multilevel facet degenerative changes. Vertebral body heights maintained without fracture or bone destruction. Disc space narrowing C4-C5 and C5-C6. Degenerative changes at anterior C1-C2. Soft tissues and spinal canal: Prevertebral soft tissues normal thickness. Atherosclerotic calcifications at the carotid bifurcations bilaterally, at proximal great vessels, and cranial aspect of aortic arch. 12 mm LEFT thyroid nodule, unchanged. Disc levels:  No specific abnormalities Upper chest: Lung apices clear Other: N/A IMPRESSION: Atrophy with small vessel chronic ischemic changes of deep cerebral white matter. No acute intracranial abnormalities. Osseous demineralization with degenerative disc and facet disease changes of the cervical spine. No acute cervical spine  abnormalities. 12 mm nonspecific LEFT thyroid  nodule unchanged since 2009; no follow-up recommended.(Ref: J Am Coll Radiol. 2015 Feb;12(2): 143-50). Electronically Signed   By: Lavonia Dana M.D.   On: 09/04/2019 17:54   Ct Pelvis Wo Contrast  Result Date: 09/04/2019 CLINICAL DATA:  [place]. Right hip and leg pain. EXAM: CT OF THE LOWER RIGHT EXTREMITY WITHOUT CONTRAST, CT PELVIS WITHOUT CONTRAST TECHNIQUE: Multidetector CT imaging of the pelvis and right lower extremity was performed according to the standard protocol. COMPARISON:  Radiographs, same date. FINDINGS: Both hips are normally located. There are advanced right hip joint degenerative changes and moderate left hip joint degenerative changes. Joint space narrowing, osteophytic spurring and subchondral cystic change right much greater than left. No acute hip fracture or evidence of AVN. As demonstrated on the radiographs there are right-sided superior and inferior pubic rami fractures. No other pelvic fractures are identified. Moderate degenerative changes at the pubic symphysis. The SI joints are intact. There is a moderate-sized extraperitoneal pelvic hematoma on the right side with mild mass effect on the right side of the bladder. No obvious intramuscular hematomas involving the right abductor muscles. The right femur is intact. No femur fracture. There is a large diffuse subcutaneous hematoma involving the right lateral upper thigh. No underlying intramuscular hematoma. The right knee joint is intact. Moderate degenerative changes. No joint effusion. Moderate to advanced atherosclerotic calcifications involving the aorta iliac arteries. A pessary ring is noted in the vagina. IMPRESSION: 1. Right-sided superior and inferior pubic rami fractures. 2. No other pelvic fractures are identified. 3. Moderate-sized extraperitoneal pelvic hematoma on the right side with mild mass effect on the right side of the bladder. 4. Large diffuse subcutaneous hematoma involving the right lateral upper thigh. 5.  Advanced right hip joint degenerative changes and moderate left hip joint degenerative changes. 6. No femur fracture. Electronically Signed   By: Marijo Sanes M.D.   On: 09/04/2019 17:44   Ct Femur Right Wo Contrast  Result Date: 09/04/2019 CLINICAL DATA:  [place]. Right hip and leg pain. EXAM: CT OF THE LOWER RIGHT EXTREMITY WITHOUT CONTRAST, CT PELVIS WITHOUT CONTRAST TECHNIQUE: Multidetector CT imaging of the pelvis and right lower extremity was performed according to the standard protocol. COMPARISON:  Radiographs, same date. FINDINGS: Both hips are normally located. There are advanced right hip joint degenerative changes and moderate left hip joint degenerative changes. Joint space narrowing, osteophytic spurring and subchondral cystic change right much greater than left. No acute hip fracture or evidence of AVN. As demonstrated on the radiographs there are right-sided superior and inferior pubic rami fractures. No other pelvic fractures are identified. Moderate degenerative changes at the pubic symphysis. The SI joints are intact. There is a moderate-sized extraperitoneal pelvic hematoma on the right side with mild mass effect on the right side of the bladder. No obvious intramuscular hematomas involving the right abductor muscles. The right femur is intact. No femur fracture. There is a large diffuse subcutaneous hematoma involving the right lateral upper thigh. No underlying intramuscular hematoma. The right knee joint is intact. Moderate degenerative changes. No joint effusion. Moderate to advanced atherosclerotic calcifications involving the aorta iliac arteries. A pessary ring is noted in the vagina. IMPRESSION: 1. Right-sided superior and inferior pubic rami fractures. 2. No other pelvic fractures are identified. 3. Moderate-sized extraperitoneal pelvic hematoma on the right side with mild mass effect on the right side of the bladder. 4. Large diffuse subcutaneous hematoma involving the right lateral  upper thigh. 5. Advanced right hip  joint degenerative changes and moderate left hip joint degenerative changes. 6. No femur fracture. Electronically Signed   By: Marijo Sanes M.D.   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 female with medical history significant of history of breast and cervical cancer, hypertension, type 2 diabetes who presents for concerns of a fall.  Patient was going down the stairs today and thinks she might of missed a step and fell.  Granddaughter witnessed it and try to catch her but could not.  *Right sided superior and inferior pubic rami fractures with right hip/thigh hematoma Moderate sized extraperitoneal pelvic hematoma and Large diffuse subcutaneous hematoma of the right lateral upper thigh S/p Mechanical Fall  - appreciate Dr. Sabra Heck of orthopedics--recommend following serial CBC and monitoring for any skin changes around her hematoma - CBC currently stable at 11.1--9.2--9.7 -Pain management- Tylenol PRN for mild pain, hydrocodone PRN  for moderate and morphine PRN for severe - PT/OT input appreciated--recommends rehab. Pt is agreeable since she leaves alone. - case management consult for discharge planning  UTI -notes increase urinary frequency and complains of dysuria. No fever. Had white count of 15.1 - UA showed moderate leukocytes and many WBC - IV Rocephin--> Keflex PO x three days  Hypertension -Continue lisinopril, metoprolol   Type 2 diabetes -Sensitive SSI -resume glipizide xl, metformin  Hyperlipidemia -continue statin  Vaginal irritation - continue premarin cream  GERD -continue PPI  DVT prophylaxis: Lovenox Code Status: full  family Communication: Plan discussed with patient at bedside and daughter Altha Harm on the phone disposition Plan: Rehab consults called: orthopedic Dr. Sabra Heck   TOTAL TIME TAKING CARE OF THIS PATIENT: *25* minutes.  >50% time spent on counselling and coordination  of care  POSSIBLE D/C IN *1-2 DAYS, DEPENDING ON CLINICAL CONDITION.  Note: This dictation was prepared with Dragon dictation along with smaller phrase technology. Any transcriptional errors that result from this process are unintentional.  Fritzi Mandes M.D on 09/06/2019 at 1:59 PM  Between 7am to 6pm - Pager - [phone]  After 6pm go to [email or web address]  [facility]   CC: Primary care physician; Tower, Wynelle Fanny, MDPatient ID: [name], female   DOB: 1936-03-14, 83 y.o.   MRN: [id number]
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
## 2019-09-06 NOTE — Progress Notes (Signed)
Physical Therapy Treatment Patient Details Name: Laura Bell MRN: [id number] DOB: 30-Jul-1936 Today's Date: 09/06/2019    History of Present Illness Laura Bell is a 83 y.o. female with medical history significant of history of breast and cervical cancer, hypertension, type 2 diabetes who presents for concerns of a fall.  Patient was going down the stairs today and thinks she might of missed a step and fell Right sided superior and inferior pubic rami factures    PT Comments    Pt is making limited progress towards goals, however is very fearful of falls with high anxiety when asked to perform OOB mobility. Pt needs to use bathroom, however once standing states "I just can't do it." No BSC in room, pt should be able to transfer on/off BSC placed beside bed. Also no recliner in room, would benefit from OOB to recliner. Discussed with RN staff. Good endurance with there-ex, however limited by pain. Will continue to progress as able.   Follow Up Recommendations  SNF     Equipment Recommendations  Rolling walker with 5" wheels    Recommendations for Other Services       Precautions / Restrictions Precautions Precautions: Fall Restrictions Weight Bearing Restrictions: Yes RLE Weight Bearing: Weight bearing as tolerated    Mobility  Bed Mobility Overal bed mobility: Needs Assistance Bed Mobility: Supine to Sit;Sit to Supine     Supine to sit: Mod assist Sit to supine: Mod assist   General bed mobility comments: needs assist scooting hips out to EOB. Once seated at EOB, tends to prop back on hands due to pain with upright posture. Very slow movement/transfer.   Transfers Overall transfer level: Needs assistance Equipment used: Rolling walker (2 wheeled) Transfers: Sit to/from Stand Sit to Stand: Min assist         General transfer comment: able to stand x 2 attempts this date with min assist and cues for hand placement. Once standing, post leaning noted with min  assist to correct. Has difficulty maintaining upright posture.   Ambulation/Gait Ambulation/Gait assistance: Mod assist Gait Distance (Feet): 4 Feet Assistive device: Rolling walker (2 wheeled) Gait Pattern/deviations: Step-to pattern     General Gait Details: 4 single steps in all directions. Pt very anxious and reports high pain level. Unable to further ambulation distances in room.    Stairs             Wheelchair Mobility    Modified Rankin (Stroke Patients Only)       Balance Overall balance assessment: Needs assistance Sitting-balance support: Feet supported;Bilateral upper extremity supported Sitting balance-Leahy Scale: Fair     Standing balance support: Bilateral upper extremity supported Standing balance-Leahy Scale: Fair Standing balance comment: post leaning noted, min assist for correction                            Cognition Arousal/Alertness: Awake/alert Behavior During Therapy: WFL for tasks assessed/performed Overall Cognitive Status: Within Functional Limits for tasks assessed                                        Exercises Other Exercises Other Exercises: supine ther-ex performed on B LE including AP, quad sets, hip abd/add, and SLRs. All ther-ex performed x 12 reps with min assist for B LEs. Needs cues for attention to task.    General Comments  Pertinent Vitals/Pain Pain Assessment: 0-10 Pain Score: 8  Pain Location: R hip Pain Descriptors / Indicators: Aching Pain Intervention(s): Limited activity within patient's tolerance;Repositioned    Home Living                      Prior Function            PT Goals (current goals can now be found in the care plan section) Acute Rehab PT Goals Patient Stated Goal: to walk PT Goal Formulation: With patient Time For Goal Achievement: 09/19/19 Potential to Achieve Goals: Good Progress towards PT goals: Progressing toward goals     Frequency    7X/week      PT Plan Current plan remains appropriate    Co-evaluation              AM-PAC PT "6 Clicks" Mobility   Outcome Measure  Help needed turning from your back to your side while in a flat bed without using bedrails?: A Lot Help needed moving from lying on your back to sitting on the side of a flat bed without using bedrails?: A Lot Help needed moving to and from a bed to a chair (including a wheelchair)?: A Lot Help needed standing up from a chair using your arms (e.g., wheelchair or bedside chair)?: A Lot Help needed to walk in hospital room?: Total Help needed climbing 3-5 steps with a railing? : Total 6 Click Score: 10    End of Session Equipment Utilized During Treatment: Gait belt Activity Tolerance: Patient limited by pain Patient left: in bed;with bed alarm set Nurse Communication: Mobility status PT Visit Diagnosis: Unsteadiness on feet (R26.81);Repeated falls (R29.6);Muscle weakness (generalized) (M62.81);Difficulty in walking, not elsewhere classified (R26.2)     Time: [id number] PT Time Calculation (min) (ACUTE ONLY): 23 min  Charges:  $Therapeutic Exercise: 23-37 mins                     Greggory Stallion, [place], DPT [phone]    Laura Bell 09/06/2019, 2:19 PM

## 2019-09-07 LAB — GLUCOSE, CAPILLARY
Glucose-Capillary: 197 mg/dL — ABNORMAL HIGH (ref 70–99)
Glucose-Capillary: 298 mg/dL — ABNORMAL HIGH (ref 70–99)
Glucose-Capillary: 198 mg/dL — ABNORMAL HIGH (ref 70–99)
Glucose-Capillary: 179 mg/dL — ABNORMAL HIGH (ref 70–99)
Glucose-Capillary: 205 mg/dL — ABNORMAL HIGH (ref 70–99)

## 2019-09-07 LAB — URINE CULTURE

## 2019-09-07 LAB — SARS CORONAVIRUS 2 (TAT 6-24 HRS): SARS Coronavirus 2: NEGATIVE

## 2019-09-07 NOTE — TOC Progression Note (Signed)
Transition of Care [facility]) - Progression Note    Patient Details  Name: Laura Bell MRN: [id number] Date of Birth: [birth year]  [facility] CM/SW Contact  Beverly Sessions, RN Phone Number: 09/07/2019, 4:26 PM  Clinical Narrative:    Bed offers presented to both patient's daughters via phone.  [facility] [employer] selected.   Accepted in the [place].  Hilda Blades at [facility] notified, and she will start auth   Expected Discharge Plan: [place] Barriers to Discharge: SNF Pending bed offer, Insurance Authorization  Expected Discharge Plan and Services Expected Discharge Plan: [place] In-house Referral: Clinical Social Work   Post Acute Care Choice: [place] Living arrangements for the past 2 months: Apartment                                       Social Determinants of Health (SDOH) Interventions    Readmission Risk Interventions Readmission Risk Prevention Plan 09/06/2019  Transportation Screening Complete  Medication Review (RN CM) Complete  Some recent data might be hidden

## 2019-09-07 NOTE — Care Management Important Message (Signed)
Important Message  Patient Details  Name: Laura Bell MRN: [id number] Date of Birth: [birth year]   Medicare Important Message Given:  Yes     Dannette Barbara 09/07/2019, 10:53 AM

## 2019-09-07 NOTE — Progress Notes (Signed)
Inpatient Diabetes Program Recommendations  AACE/ADA: New Consensus Statement on Inpatient Glycemic Control (2015)  Target Ranges:  Prepandial:   less than 140 mg/dL      Peak postprandial:   less than 180 mg/dL (1-2 hours)      Critically ill patients:  140 - 180 mg/dL   Results for Laura Bell, Laura Bell (MRN [id number]) as of 09/07/2019 11:29  Ref. Range 09/06/2019 07:51 09/06/2019 11:32 09/06/2019 16:39 09/06/2019 21:14  Glucose-Capillary Latest Ref Range: 70 - 99 mg/dL 197 (H)  2 units NOVOLOG  200 (H)  2 units NOVOLOG  208 (H)  3 units NOVOLOG  195 (H)   Results for Laura Bell, Laura Bell (MRN [id number]) as of 09/07/2019 11:29  Ref. Range 09/07/2019 08:19 09/07/2019 09:54  Glucose-Capillary Latest Ref Range: 70 - 99 mg/dL 197 (H) 298 (H)  5 units NOVOLOG      Admit with: Right sided superior and inferior pubic rami factures Moderate sized extraperitoneal pelvic hematoma with mild mass-effect on right side of bladder Large diffuse subcutaneous hematoma of the right lateral upper thigh S/p Mechanical Fall   History: DM  Home DM Meds: Glipizide 10 mg BID       Metformin 1000 mg BID  Current Orders: Glipizide 10 mg Daily       Metformin 1000 mg BID       Novolog Sensitive Correction Scale/ SSI (0-9 units) TID AC    MD- Note AM CBGs have been elevated the last few days.  May consider adding low dose Lantus to in-hospital regimen to help with CBG control:  Lantus 6 units QHS (0.1 units/kg)     --Will follow patient during hospitalization--  Wyn Quaker RN, MSN, CDE Diabetes Coordinator Inpatient Glycemic Control Team Team Pager: [phone] (8a-5p)

## 2019-09-07 NOTE — Progress Notes (Signed)
Physical Therapy Treatment Patient Details Name: Laura Bell MRN: [id number] DOB: [birth year] Today's Date: 09/07/2019    History of Present Illness Laura Bell is a 83 y.o. female with medical history significant of history of breast and cervical cancer, hypertension, type 2 diabetes who presents for concerns of a fall.  Patient was going down the stairs today and thinks she might of missed a step and fell Right sided superior and inferior pubic rami factures    PT Comments    Pt on commode with tech upon arrival finishing bathing.  Stood with min a x 1 fearful of walker moving on her, braced by Probation officer.  She is able to take several small cautious step to steps with significant limp on RLE.  Advances gait to 6' today with steps improving with distance.  Several self initiated rest breaks as needed.  Remained in recliner after session but declined further activity at this time.  RN notified of request for pain medication.   Follow Up Recommendations  SNF     Equipment Recommendations  Rolling walker with 5" wheels    Recommendations for Other Services       Precautions / Restrictions Precautions Precautions: Fall Restrictions Weight Bearing Restrictions: Yes RLE Weight Bearing: Weight bearing as tolerated    Mobility  Bed Mobility               General bed mobility comments: pt up to commode with tech upon arrival  Transfers Overall transfer level: Needs assistance Equipment used: Rolling walker (2 wheeled) Transfers: Sit to/from Stand Sit to Stand: Min assist            Ambulation/Gait Ambulation/Gait assistance: Min Web designer (Feet): 6 Feet Assistive device: Rolling walker (2 wheeled) Gait Pattern/deviations: Step-to pattern Gait velocity: decreased   General Gait Details: pain R hip with stepping - limits gait   Stairs             Wheelchair Mobility    Modified Rankin (Stroke Patients Only)       Balance Overall  balance assessment: Needs assistance Sitting-balance support: Feet supported;Bilateral upper extremity supported Sitting balance-Leahy Scale: Fair     Standing balance support: Bilateral upper extremity supported Standing balance-Leahy Scale: Fair Standing balance comment: relies on walker and min a x 1.                            Cognition Arousal/Alertness: Awake/alert Behavior During Therapy: WFL for tasks assessed/performed Overall Cognitive Status: Within Functional Limits for tasks assessed                                        Exercises      General Comments        Pertinent Vitals/Pain Pain Assessment: Faces Faces Pain Scale: Hurts even more Pain Location: R hip Pain Descriptors / Indicators: Aching Pain Intervention(s): Limited activity within patient's tolerance;Monitored during session;Patient requesting pain meds-RN notified;Repositioned    Home Living                      Prior Function            PT Goals (current goals can now be found in the care plan section) Progress towards PT goals: Progressing toward goals    Frequency    7X/week  PT Plan Current plan remains appropriate    Co-evaluation              AM-PAC PT "6 Clicks" Mobility   Outcome Measure  Help needed turning from your back to your side while in a flat bed without using bedrails?: A Lot Help needed moving from lying on your back to sitting on the side of a flat bed without using bedrails?: A Lot Help needed moving to and from a bed to a chair (including a wheelchair)?: A Little Help needed standing up from a chair using your arms (e.g., wheelchair or bedside chair)?: A Little Help needed to walk in hospital room?: A Lot Help needed climbing 3-5 steps with a railing? : Total 6 Click Score: 13    End of Session Equipment Utilized During Treatment: Gait belt Activity Tolerance: Patient tolerated treatment well;Patient limited by  pain Patient left: in chair;with call bell/phone within reach;with chair alarm set Nurse Communication: Mobility status       Time: [id number] PT Time Calculation (min) (ACUTE ONLY): 11 min  Charges:  $Gait Training: 8-22 mins                     Chesley Noon, PTA 09/07/19, 10:29 AM

## 2019-09-07 NOTE — Progress Notes (Signed)
[place] at [place] NAME: Laura Bell    MR#:  [id number]  DATE OF BIRTH:  18-Jan-1936  SUBJECTIVE:   Patient came in after mechanical fall from stairs yesterday while she was at her daughter's house.    Has some right hip pain. She is out in the chair. Work with physical therapy.  REVIEW OF SYSTEMS:   Review of Systems  Constitutional: Negative for chills, fever and weight loss.  HENT: Negative for ear discharge, ear pain and nosebleeds.   Eyes: Negative for blurred vision, pain and discharge.  Respiratory: Negative for sputum production, shortness of breath, wheezing and stridor.   Cardiovascular: Negative for chest pain, palpitations, orthopnea and PND.  Gastrointestinal: Negative for abdominal pain, diarrhea, nausea and vomiting.  Genitourinary: Negative for frequency and urgency.  Musculoskeletal: Positive for back pain, falls and joint pain.  Neurological: Positive for weakness. Negative for sensory change, speech change and focal weakness.  Psychiatric/Behavioral: Negative for depression and hallucinations. The patient is not nervous/anxious.    Tolerating Diet:yes Tolerating PT: rehab  DRUG ALLERGIES:   Allergies  Allergen Reactions  . Actos [Pioglitazone]     Fatigue/pedal edema/exercise intol and palpitations  . Alendronate Sodium     GI side eff  . Boniva [Ibandronate Sodium]     GI side eff  . Ibandronic Acid Hives    GI side eff  . Lansoprazole   . Omeprazole Itching    ? Itching     VITALS:  Blood pressure 138/63, pulse 98, temperature 98.5 F (36.9 C), temperature source Oral, resp. rate 20, height 5\' 2"  (1.575 m), weight 65.8 kg, SpO2 97 %.  PHYSICAL EXAMINATION:   Physical Exam  GENERAL:  83 y.o.-year-old patient lying in the bed with no acute distress.  EYES: Pupils equal, round, reactive to light and accommodation. No scleral icterus. Extraocular muscles intact.  HEENT: Head atraumatic,  normocephalic. Oropharynx and nasopharynx clear.  NECK:  Supple, no jugular venous distention. No thyroid enlargement, no tenderness.  LUNGS: Normal breath sounds bilaterally, no wheezing, rales, rhonchi. No use of accessory muscles of respiration.  CARDIOVASCULAR: S1, S2 normal. No murmurs, rubs, or gallops.  ABDOMEN: Soft, nontender, nondistended. Bowel sounds present. No organomegaly or mass.  EXTREMITIES: No cyanosis, clubbing or edema b/l.      NEUROLOGIC: Cranial nerves II through XII are intact. No focal Motor or sensory deficits b/l.   PSYCHIATRIC:  patient is alert and oriented x 3.  SKIN:as above  LABORATORY PANEL:  CBC Recent Labs  Lab 09/05/19 0524 09/06/19 0753  WBC 9.9  --   HGB 9.2* 9.7*  HCT 29.1*  --   PLT 251  --     Chemistries  Recent Labs  Lab 09/04/19 1646 09/05/19 0524  NA 131* 135  K 3.7 4.2  CL 97* 100  CO2 23 24  GLUCOSE 239* 233*  BUN 10 11  CREATININE 0.55 0.49  CALCIUM 9.3 8.8*  AST 16  --   ALT 14  --   ALKPHOS 55  --   BILITOT 0.6  --    Cardiac Enzymes No results for input(s): TROPONINI in the last 168 hours. RADIOLOGY:  No results found. ASSESSMENT AND PLAN:  Laura Bell is a 83 y.o. female with medical history significant of history of breast and cervical cancer, hypertension, type 2 diabetes who presents for concerns of a fall.  Patient was going down the stairs today and thinks she might of  missed a step and fell.  Granddaughter witnessed it and try to catch her but could not.  *Right sided superior and inferior pubic rami fractures with right hip/thigh hematoma Moderate sized extraperitoneal pelvic hematoma and Large diffuse subcutaneous hematoma of the right lateral upper thigh S/p Mechanical Fall  - appreciate Dr. Sabra Heck of orthopedics--recommend following serial CBC and monitoring for any skin changes around her hematoma - CBC currently stable at 11.1--9.2--9.7 -Pain management- Tylenol PRN for mild pain, hydrocodone  PRN  for moderate and morphine PRN for severe - PT/OT input appreciated--recommends rehab. Pt is agreeable since she leaves alone. - case management consult for discharge planning  UTI -notes increase urinary frequency and complains of dysuria. No fever. Had white count of 15.1 - UA showed moderate leukocytes and many WBC - IV Rocephin--> Keflex PO x three days  Hypertension -Continue lisinopril, metoprolol   Type 2 diabetes -Sensitive SSI -resume glipizide xl, metformin  Hyperlipidemia -continue statin  Vaginal irritation - continue premarin cream  GERD -continue PPI  Discussed with Education officer, museum. Awaiting insurance authorization and rehab placement.  DVT prophylaxis: Lovenox Code Status: full  family Communication: Plan discussed with patient at bedside and daughter Altha Harm on the phone disposition Plan: Rehab consults called: orthopedic Dr. Sabra Heck   TOTAL TIME TAKING CARE OF THIS PATIENT: *25* minutes.  >50% time spent on counselling and coordination of care  POSSIBLE D/C IN *1-2 DAYS, DEPENDING ON CLINICAL CONDITION.  Note: This dictation was prepared with Dragon dictation along with smaller phrase technology. Any transcriptional errors that result from this process are unintentional.  Fritzi Mandes M.D on 09/07/2019 at 2:39 PM  Between 7am to 6pm - Pager - [phone]  After 6pm go to [email or web address]  [facility]   CC: Primary care physician; Tower, Wynelle Fanny, MDPatient ID: Laura Bell, female   DOB: 09-12-1936, 83 y.o.   MRN: [id number]

## 2019-09-07 NOTE — TOC Progression Note (Signed)
Transition of Care (TOC) - Progression Note    Patient Details  Name: Laura Bell MRN: [id number] Date of Birth: 09/11/1936  Transition of Care (TOC) CM/SW Contact  ,  T, RN Phone Number: 09/07/2019, 2:27 PM  Clinical Narrative:    RNCM met with patient to present bed offers.  She requests that I speak with her daughters and allow them to select a facility.  Voicemail left for both daughters, awaiting return call   Expected Discharge Plan: Skilled Nursing Facility Barriers to Discharge: SNF Pending bed offer, Insurance Authorization  Expected Discharge Plan and Services Expected Discharge Plan: Skilled Nursing Facility In-house Referral: Clinical Social Work   Post Acute Care Choice: Skilled Nursing Facility Living arrangements for the past 2 months: Apartment                                       Social Determinants of Health (SDOH) Interventions    Readmission Risk Interventions Readmission Risk Prevention Plan 09/06/2019  Transportation Screening Complete  Medication Review (RN CM) Complete  Some recent data might be hidden   

## 2019-09-08 ENCOUNTER — Encounter: Payer: Self-pay | Admitting: *Deleted

## 2019-09-08 LAB — GLUCOSE, CAPILLARY
Glucose-Capillary: 178 mg/dL — ABNORMAL HIGH (ref 70–99)
Glucose-Capillary: 253 mg/dL — ABNORMAL HIGH (ref 70–99)
Glucose-Capillary: 97 mg/dL (ref 70–99)
Glucose-Capillary: 222 mg/dL — ABNORMAL HIGH (ref 70–99)

## 2019-09-08 MED ORDER — INSULIN GLARGINE 100 UNIT/ML ~~LOC~~ SOLN
6.0000 [IU] | Freq: Every day | SUBCUTANEOUS | Status: DC
  Administered 2019-09-08 – 2019-09-09 (×2): 6 [IU] via SUBCUTANEOUS
  Filled 2019-09-08 (×2): qty 0.06

## 2019-09-08 NOTE — Progress Notes (Signed)
Physical Therapy Treatment Patient Details Name: Laura Bell MRN: [id number] DOB: 15-Dec-1935 Today's Date: 09/08/2019    History of Present Illness Laura Bell is a 83 y.o. female with medical history significant of history of breast and cervical cancer, hypertension, type 2 diabetes who presents for concerns of a fall.  Patient was going down the stairs today and thinks she might of missed a step and fell Right sided superior and inferior pubic rami factures    PT Comments    Pt received up in chair, agreeable to participate. Worked on exercises in seated and standing, STS technique and safety with RW, progressing AMB, and balance training in stance. Pt fatigues easily but has adequate pain control to participate without distress/anxiety. Pt takes rest breaks as needed. Pt remains motivated to participate in order to regain her independence with basic mobility. Pt has more confidence and facility with transfers, but AMB remains labored, painful, and supervision needed for safety. Pt still unable to tolerate limited household distance AMB.    Follow Up Recommendations  SNF     Equipment Recommendations  Rolling walker with 5" wheels    Recommendations for Other Services       Precautions / Restrictions Precautions Precautions: Fall Restrictions RLE Weight Bearing: Weight bearing as tolerated    Mobility  Bed Mobility               General bed mobility comments: received up to chair  Transfers Overall transfer level: Needs assistance Equipment used: Rolling walker (2 wheeled) Transfers: Sit to/from Stand Sit to Stand: Supervision         General transfer comment: performed 10x in session, 5x in sequence. moving well; educate don hand placement/safe RW use.  Ambulation/Gait Ambulation/Gait assistance: Min assist Gait Distance (Feet): 12 Feet Assistive device: Rolling walker (2 wheeled) Gait Pattern/deviations: Step-to pattern     General Gait  Details: 3-point gait; multimodal cues for safe RW use, as it is too close to body; too far from body during retro stepping.   Stairs             Wheelchair Mobility    Modified Rankin (Stroke Patients Only)       Balance Overall balance assessment: Needs assistance Sitting-balance support: No upper extremity supported;Feet supported Sitting balance-Leahy Scale: Good     Standing balance support: Single extremity supported Standing balance-Leahy Scale: Good Standing balance comment: able to perform some head turns with BUE support, and single UE reaching activity., all without LOB                            Cognition Arousal/Alertness: Awake/alert Behavior During Therapy: WFL for tasks assessed/performed Overall Cognitive Status: Within Functional Limits for tasks assessed                                        Exercises General Exercises - Lower Extremity Ankle Circles/Pumps: AROM;15 reps;Seated Long Arc Quad: AROM;Seated;15 reps Other Exercises Other Exercises: Standing heel raises, BUE support 1x10 Other Exercises: Standing siongle UE fwd reach 1x10 bilat Other Exercises: STS from chair 10x in session. Other Exercises: standing horizontal head turns 1x10 bilat; vertical head turns 1x15 Other Exercises: FWD AMB/retro AMB; 56ft each, minGuard assist; heavy multitodal cuing for safe RW use    General Comments        Pertinent Vitals/Pain Pain  Assessment: No/denies pain(no pain while at rest, but some discomfort with limb movement)    Home Living                      Prior Function            PT Goals (current goals can now be found in the care plan section) Acute Rehab PT Goals Patient Stated Goal: to walk PT Goal Formulation: With patient Time For Goal Achievement: 09/19/19 Potential to Achieve Goals: Good Progress towards PT goals: Progressing toward goals    Frequency    7X/week      PT Plan Current  plan remains appropriate    Co-evaluation              AM-PAC PT "6 Clicks" Mobility   Outcome Measure  Help needed turning from your back to your side while in a flat bed without using bedrails?: A Lot Help needed moving from lying on your back to sitting on the side of a flat bed without using bedrails?: A Lot Help needed moving to and from a bed to a chair (including a wheelchair)?: A Little Help needed standing up from a chair using your arms (e.g., wheelchair or bedside chair)?: A Little Help needed to walk in hospital room?: A Little Help needed climbing 3-5 steps with a railing? : Total 6 Click Score: 14    End of Session Equipment Utilized During Treatment: Gait belt Activity Tolerance: Patient tolerated treatment well;Patient limited by fatigue Patient left: in chair;with call bell/phone within reach;with chair alarm set Nurse Communication: Mobility status PT Visit Diagnosis: Unsteadiness on feet (R26.81);Repeated falls (R29.6);Muscle weakness (generalized) (M62.81);Difficulty in walking, not elsewhere classified (R26.2)     Time: [id number] PT Time Calculation (min) (ACUTE ONLY): 28 min  Charges:  $Therapeutic Exercise: 8-22 mins $Neuromuscular Re-education: 8-22 mins                     10:55 AM, 09/08/19 Etta Grandchild, PT, DPT Physical Therapist - [facility]  [phone] ([place])    Buccola,Allan C 09/08/2019, 10:52 AM

## 2019-09-08 NOTE — Progress Notes (Signed)
PROGRESS NOTE    Laura Bell  [id number] DOB: 1936-06-04 DOA: 09/04/2019 PCP: Abner Greenspan, MD    Brief Narrative:  83 year old female with a history of hypertension, type 2 diabetes, presents after mechanical fall.  She suffered superior and inferior pubic rami fracture.  Was also noted to have a significant right hip/thigh hematoma.  She was admitted for further treatments.   Assessment & Plan:   Principal Problem:   Pubic ramus fracture (HCC) Active Problems:   Diabetes type 2, uncontrolled ([place])   Hyperlipidemia associated with type 2 diabetes mellitus ([place])   Essential hypertension   GERD   Hematoma of right thigh   1. Right-sided superior and inferior pubic rami fracture with right hip/thigh hematoma.  Status post mechanical fall.  Seen by orthopedics with recommendations for serial CBC.  Continue on pain management.  She is weightbearing as tolerated.  Seen by PT with recommendations for skilled nursing facility. 2. Urinary tract infection.  Patient had a significant leukocytosis.  Urinalysis indicated moderate leukocytes and many WBCs.  She was treated with IV Rocephin and transition to oral Keflex. 3. Hypertension.  Blood pressure stable on lisinopril metoprolol. 4. Diabetes.  Continue on glipizide XL and Metformin.  Continue on sliding scale insulin. 5. Hyperlipidemia.  Continue statin.   DVT prophylaxis: Lovenox Code Status: Full code Family Communication: None present Disposition Plan: Awaiting placement to skilled nursing facility insurance authorization.   Consultants:   Orthopedics  Procedures:     Antimicrobials:       Subjective: Continues to have pain in her right lower extremity.  No chest pain or shortness of breath.  Objective: Vitals:   09/07/19 1022 09/07/19 1323 09/07/19 1952 09/08/19 0445  BP: 126/90 138/63 131/70 (!) 158/85  Pulse: (!) 123 98 (!) 108 (!) 106  Resp: 20  18 16   Temp:  98.5 F (36.9 C) 97.9 F (36.6 C)  98.2 F (36.8 C)  TempSrc:  Oral Oral Oral  SpO2: 98% 97% 94% 97%  Weight:      Height:        Intake/Output Summary (Last 24 hours) at 09/08/2019 1936 Last data filed at 09/08/2019 1425 Gross per 24 hour  Intake 360 ml  Output 550 ml  Net -190 ml   Filed Weights   09/04/19 1448  Weight: 65.8 kg    Examination:  General exam: Appears calm and comfortable  Respiratory system: Clear to auscultation. Respiratory effort normal. Cardiovascular system: S1 & S2 heard, RRR. No JVD, murmurs, rubs, gallops or clicks. No pedal edema. Gastrointestinal system: Abdomen is nondistended, soft and nontender. No organomegaly or masses felt. Normal bowel sounds heard. Central nervous system: Alert and oriented. No focal neurological deficits. Extremities: Symmetric 5 x 5 power. Skin: No rashes, lesions or ulcers Psychiatry: Judgement and insight appear normal. Mood & affect appropriate.     Data Reviewed: I have personally reviewed following labs and imaging studies  CBC: Recent Labs  Lab 09/04/19 1646 09/05/19 0524 09/06/19 0753  WBC 15.1* 9.9  --   NEUTROABS 12.5*  --   --   HGB 11.1* 9.2* 9.7*  HCT 35.5* 29.1*  --   MCV 84.9 84.8  --   PLT 286 251  --    Basic Metabolic Panel: Recent Labs  Lab 09/04/19 1646 09/05/19 0524  NA 131* 135  K 3.7 4.2  CL 97* 100  CO2 23 24  GLUCOSE 239* 233*  BUN 10 11  CREATININE 0.55 0.49  CALCIUM 9.3  8.8*   GFR: Estimated Creatinine Clearance: 49.1 mL/min (by C-G formula based on SCr of 0.49 mg/dL). Liver Function Tests: Recent Labs  Lab 09/04/19 1646  AST 16  ALT 14  ALKPHOS 55  BILITOT 0.6  PROT 7.0  ALBUMIN 3.9   No results for input(s): LIPASE, AMYLASE in the last 168 hours. No results for input(s): AMMONIA in the last 168 hours. Coagulation Profile: Recent Labs  Lab 09/04/19 1646  INR 0.9   Cardiac Enzymes: No results for input(s): CKTOTAL, CKMB, CKMBINDEX, TROPONINI in the last 168 hours. BNP (last 3 results) No  results for input(s): PROBNP in the last 8760 hours. HbA1C: No results for input(s): HGBA1C in the last 72 hours. CBG: Recent Labs  Lab 09/07/19 1728 09/07/19 2106 09/08/19 0801 09/08/19 1142 09/08/19 1652  GLUCAP 179* 205* 178* 253* 97   Lipid Profile: No results for input(s): CHOL, HDL, LDLCALC, TRIG, CHOLHDL, LDLDIRECT in the last 72 hours. Thyroid Function Tests: No results for input(s): TSH, T4TOTAL, FREET4, T3FREE, THYROIDAB in the last 72 hours. Anemia Panel: No results for input(s): VITAMINB12, FOLATE, FERRITIN, TIBC, IRON, RETICCTPCT in the last 72 hours. Sepsis Labs: No results for input(s): PROCALCITON, LATICACIDVEN in the last 168 hours.  Recent Results (from the past 240 hour(s))  Urine culture     Status: Abnormal   Collection Time: 09/04/19  8:00 PM   Specimen: Urine, Clean Catch  Result Value Ref Range Status   Specimen Description   Final    URINE, CLEAN CATCH Performed at [facility], [address] [place], [place] [address]    Special Requests   Final    NONE Performed at [facility], [place] [address], [place], [place] [address]    Culture MULTIPLE SPECIES PRESENT, SUGGEST RECOLLECTION (A)  Final   Report Status 09/07/2019 FINAL  Final  SARS CORONAVIRUS 2 (TAT 6-24 HRS) Nasopharyngeal Nasopharyngeal Swab     Status: None   Collection Time: 09/04/19  9:25 PM   Specimen: Nasopharyngeal Swab  Result Value Ref Range Status   SARS Coronavirus 2 NEGATIVE NEGATIVE Final    Comment: (NOTE) SARS-CoV-2 target nucleic acids are NOT DETECTED. The SARS-CoV-2 RNA is generally detectable in upper and lower respiratory specimens during the acute phase of infection. Negative results do not preclude SARS-CoV-2 infection, do not rule out co-infections with other pathogens, and should not be used as the sole basis for treatment or other patient management decisions. Negative results must be combined with clinical observations, patient history, and  epidemiological information. The expected result is Negative. Fact Sheet for Patients: SugarRoll.be Fact Sheet for Healthcare Providers: https://www.woods-mathews.com/ This test is not yet approved or cleared by the [place] FDA and  has been authorized for detection and/or diagnosis of SARS-CoV-2 by FDA under an Emergency Use Authorization (EUA). This EUA will remain  in effect (meaning this test can be used) for the duration of the COVID-19 declaration under Section 56 4(b)(1) of the Act, 21 U.S.C. section 360bbb-3(b)(1), unless the authorization is terminated or revoked sooner. Performed at [facility], [place] [address] [place], [place] [address]   SARS CORONAVIRUS 2 (TAT 6-24 HRS) Nasopharyngeal Nasopharyngeal Swab     Status: None   Collection Time: 09/07/19 10:44 AM   Specimen: Nasopharyngeal Swab  Result Value Ref Range Status   SARS Coronavirus 2 NEGATIVE NEGATIVE Final    Comment: (NOTE) SARS-CoV-2 target nucleic acids are NOT DETECTED. The SARS-CoV-2 RNA is generally detectable in upper and lower  respiratory specimens during the acute phase of infection. Negative results do not preclude SARS-CoV-2 infection, do not rule out co-infections with other pathogens, and should not be used as the sole basis for treatment or other patient management decisions. Negative results must be combined with clinical observations, patient history, and epidemiological information. The expected result is Negative. Fact Sheet for Patients: SugarRoll.be Fact Sheet for Healthcare Providers: https://www.woods-mathews.com/ This test is not yet approved or cleared by the [place] FDA and  has been authorized for detection and/or diagnosis of SARS-CoV-2 by FDA under an Emergency Use Authorization (EUA). This EUA will remain  in effect (meaning this test can be used) for the duration of the COVID-19  declaration under Section 56 4(b)(1) of the Act, 21 U.S.C. section 360bbb-3(b)(1), unless the authorization is terminated or revoked sooner. Performed at [facility], [place] [address], [place], [place] [address]          Radiology Studies: No results found.      Scheduled Meds: . aspirin EC  81 mg Oral Daily  . citalopram  20 mg Oral Daily  . conjugated estrogens  1 Applicatorful Vaginal Q M,W,F-2000  . famotidine  40 mg Oral Daily  . glipiZIDE  10 mg Oral Q breakfast  . insulin aspart  0-9 Units Subcutaneous TID WC  . insulin glargine  6 Units Subcutaneous Daily  . lisinopril  40 mg Oral Daily  . metFORMIN  1,000 mg Oral BID WC  . metoprolol succinate  50 mg Oral Daily  . senna  1 tablet Oral BID  . simvastatin  20 mg Oral QPM  . cyanocobalamin  1,000 mcg Oral Daily   Continuous Infusions: . sodium chloride Stopped (09/05/19 0455)     LOS: 4 days    Time spent: 29mins    Kathie Dike, MD [facility]   If 7PM-7AM, please contact night-coverage [email or web address]  09/08/2019, 7:36 PM

## 2019-09-08 NOTE — Plan of Care (Signed)
Problem: Education: Goal: Knowledge of General Education information will improve Description: Including pain rating scale, medication(s)/side effects and non-pharmacologic comfort measures Outcome: Progressing  Problem: Education: Goal: Knowledge of General Education information will improve Description: Including pain rating scale, medication(s)/side effects and non-pharmacologic comfort measures Outcome: Not Progressing Pt is A/O to self and does not understands POC.  Will continue to monitor and offer support.    Problem: Health Behavior/Discharge Planning: Goal: Ability to manage health-related needs will improve Outcome: Not Progressing Pt admitted from home with right hips fracture.  All labs WNL.  VS WNL.  Will continue to monitor and assess.      Problem: Clinical Measurements: Goal: Ability to maintain clinical measurements within normal limits will improve Outcome: Progressing Pt admitted from home with right hips fracture.  All labs WNL.  VS WNL.  Will continue to monitor and assess.      Problem: Clinical Measurements: Goal: Will remain free from infection Outcome: Progressing S/Sx of infection monitored and assessed q-shift/ PRN, along with VS.  Pt has remained afebrile thus far.  Will continue to monitor and assess.    Problem: Clinical Measurements: Goal: Respiratory complications will improve Outcome: Progressing Respiratory status monitor and assessed q-shift/ PRN, along with VS.  Pt is on room air with O2 saturations at 94-97% and respiration rate of 16-18 breaths per minute.  Will continue to monitor and assess.    Problem: Clinical Measurements: Goal: Cardiovascular complication will be avoided Outcome: Progressing Pt was slightly hypertensive this shift.  All other VS WNL.  Will continue to monitor and assess.   Problem: Activity: Goal: Risk for activity intolerance will decrease 09/08/2019 0422 by Bjorn Pippin, RN Outcome: Progressing Pt is unable to  get OOB.  She is unable to reposition herself in the bed and needs the assistance from RN staff.  Will continue to monitor and assess.    Problem: Nutrition: Goal: Adequate nutrition will be maintained 09/08/2019 0422 by Bjorn Pippin, RN Outcome: Progressing Pt is on a heart healthy diet and is tolerating it well.  Will continue to monitor and assess.   Problem: Coping: Goal: Level of anxiety will decrease 09/08/2019 0422 by Bjorn Pippin, RN Outcome: Progressing Pt has not expressed c/o of anxiety r/t her hospitalization.  Will continue to monitor and assess.   Problem: Elimination: Goal: Will not experience complications related to bowel motility 09/08/2019 0422 by Bjorn Pippin, RN Outcome: Progressing Pt is incontinent of both bowel and bladder.  She has a pure wick in place to contain her urine.  Will continue to monitor and assess.   Problem: Elimination: Goal: Will not experience complications related to urinary retention 09/08/2019 0422 by Bjorn Pippin, RN Outcome: Progressing Pt is incontinent of both her bowel and bladder.  She has a pure wick in place to contain her urine. Will continue to monitor and assess.    Problem: Pain Managment: Goal: General experience of comfort will improve 09/08/2019 0422 by Bjorn Pippin, RN Outcome: Progressing Pt has denied pain thus far.  Will continue to monitor and assess.   Problem: Safety: Goal: Ability to remain free from injury will improve 09/08/2019 0422 by Bjorn Pippin, RN Outcome: Progressing Instructed pt to utilize RN call light for assistance.  Hourly rounds performed.  Bed alarm implemented to keep pt safe from falls.  Bed in lowest position, locked with two upper/ one lower side rails engaged.  Belongings and call light within reach.  Problem: Skin Integrity: Goal: Risk for impaired skin integrity will decrease 09/08/2019 0349 by Bjorn Pippin, RN Outcome: Progressing Skin integrity monitored and assessed q-shift/  PRN.  Instructed pt to turn q2 hours to prevent further skin impairment.  Tubes and drains assessed for device related pressure sores.  Dressing changes performed per MD's orders.  Will continue to monitor and assess.

## 2019-09-09 LAB — CBC
HCT: 29 % — ABNORMAL LOW (ref 36.0–46.0)
Hemoglobin: 9.4 g/dL — ABNORMAL LOW (ref 12.0–15.0)
MCH: 27.1 pg (ref 26.0–34.0)
MCHC: 32.4 g/dL (ref 30.0–36.0)
MCV: 83.6 fL (ref 80.0–100.0)
Platelets: 292 10*3/uL (ref 150–400)
RBC: 3.47 MIL/uL — ABNORMAL LOW (ref 3.87–5.11)
RDW: 14.8 % (ref 11.5–15.5)
WBC: 8.5 10*3/uL (ref 4.0–10.5)
nRBC: 0 % (ref 0.0–0.2)

## 2019-09-09 LAB — GLUCOSE, CAPILLARY
Glucose-Capillary: 219 mg/dL — ABNORMAL HIGH (ref 70–99)
Glucose-Capillary: 270 mg/dL — ABNORMAL HIGH (ref 70–99)
Glucose-Capillary: 71 mg/dL (ref 70–99)
Glucose-Capillary: 191 mg/dL — ABNORMAL HIGH (ref 70–99)

## 2019-09-09 MED ORDER — INSULIN GLARGINE 100 UNIT/ML ~~LOC~~ SOLN
10.0000 [IU] | Freq: Every day | SUBCUTANEOUS | Status: DC
  Administered 2019-09-10 – 2019-09-13 (×4): 10 [IU] via SUBCUTANEOUS
  Filled 2019-09-09 (×5): qty 0.1

## 2019-09-09 NOTE — Progress Notes (Signed)
Slept well overnight; no acute distress noted. Barbaraann Faster, RN 6:04 AM12/07/2019

## 2019-09-09 NOTE — Progress Notes (Signed)
Inpatient Diabetes Program Recommendations  AACE/ADA: New Consensus Statement on Inpatient Glycemic Control (2015)  Target Ranges:  Prepandial:   less than 140 mg/dL      Peak postprandial:   less than 180 mg/dL (1-2 hours)      Critically ill patients:  140 - 180 mg/dL   Lab Results  Component Value Date   GLUCAP 270 (H) 09/09/2019   HGBA1C 8.4 (H) 09/04/2019    Review of Glycemic Control  History: DM  Home DM Meds: Glipizide 10 mg BID                             Metformin 1000 mg BID  Current Orders:  Lantus 6 units qd       Glipizide 10 mg Daily                             Metformin 1000 mg BID                             Novolog Sensitive Correction Scale/ SSI (0-9 units) TID [facility]  Inpatient Diabetes Program Recommendations:   -Increase Lantus to 8-10 units daily. Secure chat to Dr. Roderic Palau.  Thank you, Nani Gasser. Vidyuth Belsito, RN, MSN, CDE  Diabetes Coordinator Inpatient Glycemic Control Team Team Pager [phone] (8am-5pm) 09/09/2019 1:52 PM

## 2019-09-09 NOTE — Progress Notes (Signed)
PROGRESS NOTE    Laura Bell  [id number] DOB: December 20, 1935 DOA: 09/04/2019 PCP: Abner Greenspan, MD    Brief Narrative:  83 year old female with a history of hypertension, type 2 diabetes, presents after mechanical fall.  She suffered superior and inferior pubic rami fracture.  Was also noted to have a significant right hip/thigh hematoma.  She was admitted for further treatments.   Assessment & Plan:   Principal Problem:   Pubic ramus fracture (HCC) Active Problems:   Diabetes type 2, uncontrolled ([employer])   Hyperlipidemia associated with type 2 diabetes mellitus ([place])   Essential hypertension   GERD   Hematoma of right thigh   1. Right-sided superior and inferior pubic rami fracture with right hip/thigh hematoma.  Status post mechanical fall.  Seen by orthopedics with recommendations for serial CBC.  Hemoglobin has been stable. Continue on pain management.  She is weightbearing as tolerated.  Seen by PT with recommendations for skilled nursing facility. 2. Urinary tract infection.  Patient had a significant leukocytosis.  Urinalysis indicated moderate leukocytes and many WBCs.  She was treated with IV Rocephin and transitioned to oral Keflex. She has completed her course of antibiotics 3. Hypertension.  Blood pressure stable on lisinopril metoprolol. 4. Diabetes.  Continue on glipizide XL and Metformin.  Continue on sliding scale insulin. Started on lantus for uncontrolled blood sugars 5. Hyperlipidemia.  Continue statin.   DVT prophylaxis: Lovenox Code Status: Full code Family Communication: None present Disposition Plan: Awaiting placement to skilled nursing facility insurance authorization.   Consultants:   Orthopedics  Procedures:     Antimicrobials:       Subjective: No chest pain or shortness of breath. No nausea or vomiting. Still has soreness in her hip  Objective: Vitals:   09/08/19 2152 09/09/19 0429 09/09/19 0912 09/09/19 1151  BP: (!) 141/74  129/68 137/84 125/70  Pulse: 85 86 (!) 101 92  Resp: 17 18    Temp: 98.4 F (36.9 C) 98.3 F (36.8 C) 98.2 F (36.8 C) (!) 97.4 F (36.3 C)  TempSrc: Oral Oral Oral Oral  SpO2: 96% 97% 99% 97%  Weight:      Height:        Intake/Output Summary (Last 24 hours) at 09/09/2019 1720 Last data filed at 09/09/2019 1002 Gross per 24 hour  Intake 120 ml  Output 200 ml  Net -80 ml   Filed Weights   09/04/19 1448  Weight: 65.8 kg    Examination:  General exam: Alert, awake, oriented x 3 Respiratory system: Clear to auscultation. Respiratory effort normal. Cardiovascular system:RRR. No murmurs, rubs, gallops. Gastrointestinal system: Abdomen is nondistended, soft and nontender. No organomegaly or masses felt. Normal bowel sounds heard. Central nervous system: Alert and oriented. No focal neurological deficits. Extremities: No C/C/E, +pedal pulses Skin: No rashes, lesions or ulcers Psychiatry: Judgement and insight appear normal. Mood & affect appropriate.      Data Reviewed: I have personally reviewed following labs and imaging studies  CBC: Recent Labs  Lab 09/04/19 1646 09/05/19 0524 09/06/19 0753 09/09/19 0409  WBC 15.1* 9.9  --  8.5  NEUTROABS 12.5*  --   --   --   HGB 11.1* 9.2* 9.7* 9.4*  HCT 35.5* 29.1*  --  29.0*  MCV 84.9 84.8  --  83.6  PLT 286 251  --  123456   Basic Metabolic Panel: Recent Labs  Lab 09/04/19 1646 09/05/19 0524  NA 131* 135  K 3.7 4.2  CL 97* 100  CO2 23 24  GLUCOSE 239* 233*  BUN 10 11  CREATININE 0.55 0.49  CALCIUM 9.3 8.8*   GFR: Estimated Creatinine Clearance: 49.1 mL/min (by C-G formula based on SCr of 0.49 mg/dL). Liver Function Tests: Recent Labs  Lab 09/04/19 1646  AST 16  ALT 14  ALKPHOS 55  BILITOT 0.6  PROT 7.0  ALBUMIN 3.9   No results for input(s): LIPASE, AMYLASE in the last 168 hours. No results for input(s): AMMONIA in the last 168 hours. Coagulation Profile: Recent Labs  Lab 09/04/19 1646  INR 0.9    Cardiac Enzymes: No results for input(s): CKTOTAL, CKMB, CKMBINDEX, TROPONINI in the last 168 hours. BNP (last 3 results) No results for input(s): PROBNP in the last 8760 hours. HbA1C: No results for input(s): HGBA1C in the last 72 hours. CBG: Recent Labs  Lab 09/08/19 1652 09/08/19 2150 09/09/19 0741 09/09/19 1149 09/09/19 1643  GLUCAP 97 222* 219* 270* 71   Lipid Profile: No results for input(s): CHOL, HDL, LDLCALC, TRIG, CHOLHDL, LDLDIRECT in the last 72 hours. Thyroid Function Tests: No results for input(s): TSH, T4TOTAL, FREET4, T3FREE, THYROIDAB in the last 72 hours. Anemia Panel: No results for input(s): VITAMINB12, FOLATE, FERRITIN, TIBC, IRON, RETICCTPCT in the last 72 hours. Sepsis Labs: No results for input(s): PROCALCITON, LATICACIDVEN in the last 168 hours.  Recent Results (from the past 240 hour(s))  Urine culture     Status: Abnormal   Collection Time: 09/04/19  8:00 PM   Specimen: Urine, Clean Catch  Result Value Ref Range Status   Specimen Description   Final    URINE, CLEAN CATCH Performed at [facility], [address] [place], [place] [address]    Special Requests   Final    NONE Performed at [facility], [place] [address] [place], [place] [address]    Culture MULTIPLE SPECIES PRESENT, SUGGEST RECOLLECTION (A)  Final   Report Status 09/07/2019 FINAL  Final  SARS CORONAVIRUS 2 (TAT 6-24 HRS) Nasopharyngeal Nasopharyngeal Swab     Status: None   Collection Time: 09/04/19  9:25 PM   Specimen: Nasopharyngeal Swab  Result Value Ref Range Status   SARS Coronavirus 2 NEGATIVE NEGATIVE Final    Comment: (NOTE) SARS-CoV-2 target nucleic acids are NOT DETECTED. The SARS-CoV-2 RNA is generally detectable in upper and lower respiratory specimens during the acute phase of infection. Negative results do not preclude SARS-CoV-2 infection, do not rule out co-infections with other pathogens, and should not be used as the sole basis for  treatment or other patient management decisions. Negative results must be combined with clinical observations, patient history, and epidemiological information. The expected result is Negative. Fact Sheet for Patients: SugarRoll.be Fact Sheet for Healthcare Providers: https://www.woods-mathews.com/ This test is not yet approved or cleared by the [place] FDA and  has been authorized for detection and/or diagnosis of SARS-CoV-2 by FDA under an Emergency Use Authorization (EUA). This EUA will remain  in effect (meaning this test can be used) for the duration of the COVID-19 declaration under Section 56 4(b)(1) of the Act, 21 U.S.C. section 360bbb-3(b)(1), unless the authorization is terminated or revoked sooner. Performed at [facility], [place] [address], [place], [place] [address]   SARS CORONAVIRUS 2 (TAT 6-24 HRS) Nasopharyngeal Nasopharyngeal Swab     Status: None   Collection Time: 09/07/19 10:44 AM   Specimen: Nasopharyngeal Swab  Result Value Ref Range Status   SARS Coronavirus 2 NEGATIVE NEGATIVE Final    Comment: (  NOTE) SARS-CoV-2 target nucleic acids are NOT DETECTED. The SARS-CoV-2 RNA is generally detectable in upper and lower respiratory specimens during the acute phase of infection. Negative results do not preclude SARS-CoV-2 infection, do not rule out co-infections with other pathogens, and should not be used as the sole basis for treatment or other patient management decisions. Negative results must be combined with clinical observations, patient history, and epidemiological information. The expected result is Negative. Fact Sheet for Patients: SugarRoll.be Fact Sheet for Healthcare Providers: https://www.woods-mathews.com/ This test is not yet approved or cleared by the [place] FDA and  has been authorized for detection and/or diagnosis of SARS-CoV-2 by FDA under an  Emergency Use Authorization (EUA). This EUA will remain  in effect (meaning this test can be used) for the duration of the COVID-19 declaration under Section 56 4(b)(1) of the Act, 21 U.S.C. section 360bbb-3(b)(1), unless the authorization is terminated or revoked sooner. Performed at [facility], [place] [address] [place], [place] [address]          Radiology Studies: No results found.      Scheduled Meds: . aspirin EC  81 mg Oral Daily  . citalopram  20 mg Oral Daily  . conjugated estrogens  1 Applicatorful Vaginal Q M,W,F-2000  . famotidine  40 mg Oral Daily  . glipiZIDE  10 mg Oral Q breakfast  . insulin aspart  0-9 Units Subcutaneous TID WC  . [START ON 09/10/2019] insulin glargine  10 Units Subcutaneous Daily  . lisinopril  40 mg Oral Daily  . metFORMIN  1,000 mg Oral BID WC  . metoprolol succinate  50 mg Oral Daily  . senna  1 tablet Oral BID  . simvastatin  20 mg Oral QPM  . cyanocobalamin  1,000 mcg Oral Daily   Continuous Infusions: . sodium chloride Stopped (09/05/19 0455)     LOS: 5 days    Time spent: 9mins    Kathie Dike, MD [facility]   If 7PM-7AM, please contact night-coverage [email or web address]  09/09/2019, 5:20 PM

## 2019-09-09 NOTE — TOC Progression Note (Signed)
Transition of Care [facility]) - Progression Note    Patient Details  Name: Laura Bell MRN: [id number] Date of Birth: 08-02-1936  Transition of Care [facility]) CM/SW Contact  Shade Flood, LCSW Phone Number: 09/09/2019, 3:13 PM  Clinical Narrative:     TOC following. TOC director informed team today that [facility] prior authorizations are being waived again due to the Covid situation. Contacted Debra at [facility] to inquire if pt could transfer today given this update. Per Hilda Blades, their insurance team is attempting to get confirmation from [facility] that this is the case. They do not want to admit pt until they receive the auth they started or until they confirm the waiver. Hilda Blades to notify TOC when either on or the other occurs.  Pt's current negative Covid result is acceptable for dc today or tomorrow. If pt is not dc'd tomorrow, she will need another test.   Will follow.  Expected Discharge Plan: Skilled Nursing Facility Barriers to Discharge: SNF Pending bed offer, Insurance Authorization  Expected Discharge Plan and Services Expected Discharge Plan: [place] In-house Referral: Clinical Social Work   Post Acute Care Choice: [place] Living arrangements for the past 2 months: Apartment                                       Social Determinants of Health (SDOH) Interventions    Readmission Risk Interventions Readmission Risk Prevention Plan 09/06/2019  Transportation Screening Complete  Medication Review (RN CM) Complete  Some recent data might be hidden

## 2019-09-09 NOTE — Progress Notes (Signed)
Physical Therapy Treatment Patient Details Name: Laura Bell MRN: [id number] DOB: [birth year] Today's Date: 09/09/2019    History of Present Illness Laura Bell is a 83 y.o. female with medical history significant of history of breast and cervical cancer, hypertension, type 2 diabetes who presents for concerns of a fall.  Patient was going down the stairs today and thinks she might of missed a step and fell Right sided superior and inferior pubic rami factures    PT Comments    Pt in bed, ready to get up.  Min a to edge of bed with inc time.  Stood with min a x 1 and was able to progress gait 30' today with RW and min assist.  Generally steady but slow gait.  Stopped at commode but only passed gas.  She walked back to recliner and remained up in chair.  Overall progressing towards goals but mobility remains limited and SNF appropriate for discharge.   Follow Up Recommendations  SNF     Equipment Recommendations  Rolling walker with 5" wheels    Recommendations for Other Services       Precautions / Restrictions Precautions Precautions: Fall Restrictions Weight Bearing Restrictions: Yes RLE Weight Bearing: Weight bearing as tolerated    Mobility  Bed Mobility Overal bed mobility: Needs Assistance Bed Mobility: Supine to Sit     Supine to sit: Min assist        Transfers Overall transfer level: Needs assistance Equipment used: Rolling walker (2 wheeled) Transfers: Sit to/from Stand Sit to Stand: Min guard            Ambulation/Gait Ambulation/Gait assistance: Herbalist (Feet): 20 Feet Assistive device: Rolling walker (2 wheeled) Gait Pattern/deviations: Step-to pattern Gait velocity: decreased   General Gait Details: 3-point gait; multimodal cues for safe RW use, as it is too close to body; too far from body during retro stepping.   Stairs             Wheelchair Mobility    Modified Rankin (Stroke Patients Only)        Balance Overall balance assessment: Needs assistance Sitting-balance support: No upper extremity supported;Feet supported Sitting balance-Leahy Scale: Good     Standing balance support: Bilateral upper extremity supported Standing balance-Leahy Scale: Fair Standing balance comment: relies on RW for support.                            Cognition Arousal/Alertness: Awake/alert Behavior During Therapy: WFL for tasks assessed/performed Overall Cognitive Status: Within Functional Limits for tasks assessed                                        Exercises Other Exercises Other Exercises: to commode    General Comments        Pertinent Vitals/Pain Pain Assessment: Faces Faces Pain Scale: Hurts little more Pain Location: R hip with mobility. Pain Descriptors / Indicators: Aching;Sore Pain Intervention(s): Limited activity within patient's tolerance;Monitored during session;Repositioned    Home Living                      Prior Function            PT Goals (current goals can now be found in the care plan section) Progress towards PT goals: Progressing toward goals    Frequency  7X/week      PT Plan Current plan remains appropriate    Co-evaluation              AM-PAC PT "6 Clicks" Mobility   Outcome Measure  Help needed turning from your back to your side while in a flat bed without using bedrails?: A Lot Help needed moving from lying on your back to sitting on the side of a flat bed without using bedrails?: A Lot Help needed moving to and from a bed to a chair (including a wheelchair)?: A Little Help needed standing up from a chair using your arms (e.g., wheelchair or bedside chair)?: A Little Help needed to walk in hospital room?: A Little Help needed climbing 3-5 steps with a railing? : A Lot 6 Click Score: 15    End of Session Equipment Utilized During Treatment: Gait belt Activity Tolerance: Patient  tolerated treatment well;Patient limited by fatigue Patient left: in chair;with call bell/phone within reach;with chair alarm set Nurse Communication: Mobility status       Time: [id number] PT Time Calculation (min) (ACUTE ONLY): 15 min  Charges:  $Gait Training: 8-22 mins                    Chesley Noon, PTA 09/09/19, 11:51 AM

## 2019-09-10 ENCOUNTER — Telehealth: Payer: Self-pay | Admitting: Oncology

## 2019-09-10 ENCOUNTER — Other Ambulatory Visit: Payer: Self-pay | Admitting: Oncology

## 2019-09-10 DIAGNOSIS — S7011XD Contusion of right thigh, subsequent encounter: Secondary | ICD-10-CM

## 2019-09-10 LAB — GLUCOSE, CAPILLARY
Glucose-Capillary: 195 mg/dL — ABNORMAL HIGH (ref 70–99)
Glucose-Capillary: 314 mg/dL — ABNORMAL HIGH (ref 70–99)
Glucose-Capillary: 132 mg/dL — ABNORMAL HIGH (ref 70–99)
Glucose-Capillary: 148 mg/dL — ABNORMAL HIGH (ref 70–99)

## 2019-09-10 NOTE — Care Management Important Message (Signed)
Important Message  Patient Details  Name: Laura Bell MRN: [id number] Date of Birth: 1935/10/20   Medicare Important Message Given:  Yes     Dannette Barbara 09/10/2019, 11:24 AM

## 2019-09-10 NOTE — TOC Progression Note (Signed)
Transition of Care [facility]) - Progression Note    Patient Details  Name: Laura Bell MRN: [id number] Date of Birth: 10/18/1935  [facility]) CM/SW Contact  Beverly Sessions, RN Phone Number: 09/10/2019, 6:21 PM  Clinical Narrative:     Hilda Blades at [facility] reported to [place] that they still do not have insurance approval for patient to come.  It is now after hours, and I am unable to inquire if the followed up on the [employer].   Patient will need repeat covid prior to discharge.  MD notified   Expected Discharge Plan: [place] Barriers to Discharge: SNF Pending bed offer, Insurance Authorization  Expected Discharge Plan and Services Expected Discharge Plan: [place] In-house Referral: Clinical Social Work   Post Acute Care Choice: [place] Living arrangements for the past 2 months: Apartment                                       Social Determinants of Health (SDOH) Interventions    Readmission Risk Interventions Readmission Risk Prevention Plan 09/06/2019  Transportation Screening Complete  Medication Review (RN CM) Complete  Some recent data might be hidden

## 2019-09-10 NOTE — Telephone Encounter (Signed)
R/s appt per 12/11 sch message - unable to reach pt . Left message with appt date and time

## 2019-09-10 NOTE — Progress Notes (Signed)
Physical Therapy Treatment Patient Details Name: Laura Bell MRN: [id number] DOB: [birth year] Today's Date: 09/10/2019    History of Present Illness Laura Bell is a 83 y.o. female with medical history significant of history of breast and cervical cancer, hypertension, type 2 diabetes who presents for concerns of a fall.  Patient was going down the stairs today and thinks she might of missed a step and fell Right sided superior and inferior pubic rami factures    PT Comments    Pt presented with deficits in strength, transfers, mobility, gait, balance, and activity tolerance but continued to make progress towards goals.  Pt was Mod Ind with sup to sit with extra time and effort required but no physical assistance.  Pt was steady with transfers but required cues for sequencing for proper hand placement.  Gait remained ataxic on the RLE with very slow cadence but steady without LOB.  Pt will benefit from PT services in a SNF setting upon discharge to safely address above deficits for decreased caregiver assistance and eventual return to PLOF.      Follow Up Recommendations  SNF     Equipment Recommendations  Rolling walker with 5" wheels    Recommendations for Other Services       Precautions / Restrictions Precautions Precautions: Fall Restrictions Weight Bearing Restrictions: Yes RLE Weight Bearing: Weight bearing as tolerated    Mobility  Bed Mobility Overal bed mobility: Modified Independent Bed Mobility: Supine to Sit     Supine to sit: Modified independent (Device/Increase time)     General bed mobility comments: Extra time and effort required  Transfers Overall transfer level: Needs assistance Equipment used: Rolling walker (2 wheeled) Transfers: Sit to/from Stand Sit to Stand: Min guard         General transfer comment: Mod verbal and tactile cues for sequencing  Ambulation/Gait Ambulation/Gait assistance: Min guard Gait Distance (Feet): 20  Feet Assistive device: Rolling walker (2 wheeled) Gait Pattern/deviations: Step-to pattern;Decreased step length - left;Decreased stance time - right;Antalgic Gait velocity: decreased   General Gait Details: Mildly antalgic gait pattern on the RLE but steady without LOB; mod verbal and visual cues for proper sequencing   Stairs             Wheelchair Mobility    Modified Rankin (Stroke Patients Only)       Balance Overall balance assessment: Needs assistance Sitting-balance support: No upper extremity supported;Feet supported Sitting balance-Leahy Scale: Good     Standing balance support: Bilateral upper extremity supported Standing balance-Leahy Scale: Good                              Cognition Arousal/Alertness: Awake/alert Behavior During Therapy: WFL for tasks assessed/performed Overall Cognitive Status: Within Functional Limits for tasks assessed                                        Exercises Total Joint Exercises Ankle Circles/Pumps: Strengthening;Both;10 reps;15 reps Quad Sets: Strengthening;Both;10 reps;15 reps Gluteal Sets: Strengthening;Both;10 reps;15 reps Hip ABduction/ADduction: AROM;AAROM;Both;10 reps Straight Leg Raises: AROM;AAROM;Both;10 reps Long Arc Quad: Strengthening;Both;10 reps Knee Flexion: Strengthening;Both;10 reps Other Exercises Other Exercises: Dynamic standing balance training with feet apart and together with reaching outside BOS    General Comments        Pertinent Vitals/Pain Pain Assessment: 0-10 Pain Score: 7  Pain  Location: R hip Pain Descriptors / Indicators: Aching;Sore Pain Intervention(s): Premedicated before session;Monitored during session    Home Living                      Prior Function            PT Goals (current goals can now be found in the care plan section) Progress towards PT goals: Progressing toward goals    Frequency    7X/week      PT Plan  Current plan remains appropriate    Co-evaluation              AM-PAC PT "6 Clicks" Mobility   Outcome Measure  Help needed turning from your back to your side while in a flat bed without using bedrails?: A Little Help needed moving from lying on your back to sitting on the side of a flat bed without using bedrails?: A Little Help needed moving to and from a bed to a chair (including a wheelchair)?: A Little Help needed standing up from a chair using your arms (e.g., wheelchair or bedside chair)?: A Little Help needed to walk in hospital room?: A Little Help needed climbing 3-5 steps with a railing? : A Lot 6 Click Score: 17    End of Session Equipment Utilized During Treatment: Gait belt Activity Tolerance: Patient tolerated treatment well Patient left: in chair;with chair alarm set;with call bell/phone within reach Nurse Communication: Mobility status PT Visit Diagnosis: Unsteadiness on feet (R26.81);Repeated falls (R29.6);Muscle weakness (generalized) (M62.81);Difficulty in walking, not elsewhere classified (R26.2)     Time: [id number] PT Time Calculation (min) (ACUTE ONLY): 39 min  Charges:  $Gait Training: 8-22 mins $Therapeutic Exercise: 23-37 mins                     D. Scott Cielo Arias PT, DPT 09/10/19, 1:36 PM

## 2019-09-10 NOTE — Progress Notes (Signed)
PROGRESS NOTE    Laura Bell  [id number] DOB: 1936/06/02 DOA: 09/04/2019 PCP: Abner Greenspan, MD    Brief Narrative:  83 year old female with a history of hypertension, type 2 diabetes, presents after mechanical fall.  She suffered superior and inferior pubic rami fracture.  Was also noted to have a significant right hip/thigh hematoma.  She was admitted for further treatments.   Assessment & Plan:   Principal Problem:   Pubic ramus fracture (HCC) Active Problems:   Diabetes type 2, uncontrolled ([place])   Hyperlipidemia associated with type 2 diabetes mellitus ([place])   Essential hypertension   GERD   Hematoma of right thigh   1. Right-sided superior and inferior pubic rami fracture with right hip/thigh hematoma.  Status post mechanical fall.  Seen by orthopedics with recommendations for serial CBC.  Hemoglobin has been stable. Continue on pain management.  She is weightbearing as tolerated.  Seen by PT with recommendations for skilled nursing facility. 2. Urinary tract infection.  Patient had a significant leukocytosis.  Urinalysis indicated moderate leukocytes and many WBCs.  She was treated with IV Rocephin and transitioned to oral Keflex. She has completed her course of antibiotics 3. Hypertension.  Blood pressure stable on lisinopril metoprolol. 4. Diabetes.  Continue on glipizide XL and Metformin.  Continue on sliding scale insulin. Started on lantus for uncontrolled blood sugars 5. Hyperlipidemia.  Continue statin.   DVT prophylaxis: Lovenox Code Status: Full code Family Communication: None present Disposition Plan: Awaiting placement to skilled nursing facility insurance authorization.   Consultants:   Orthopedics  Procedures:     Antimicrobials:       Subjective: No new complaints.  No chest pain or shortness of breath. Objective: Vitals:   09/09/19 1151 09/09/19 2146 09/10/19 0626 09/10/19 1223  BP: 125/70 131/68 (!) 157/75 132/66  Pulse: 92 89  97 94  Resp:  18 18 18   Temp: (!) 97.4 F (36.3 C) 98.4 F (36.9 C) 98.4 F (36.9 C) (!) 97.2 F (36.2 C)  TempSrc: Oral Oral Oral Axillary  SpO2: 97% 96% 97% 98%  Weight:      Height:        Intake/Output Summary (Last 24 hours) at 09/10/2019 2015 Last data filed at 09/10/2019 1700 Gross per 24 hour  Intake 840 ml  Output 650 ml  Net 190 ml   Filed Weights   09/04/19 1448  Weight: 65.8 kg    Examination:  General exam: Alert, awake, oriented x 3 Respiratory system: Clear to auscultation. Respiratory effort normal. Cardiovascular system:RRR. No murmurs, rubs, gallops.     Data Reviewed: I have personally reviewed following labs and imaging studies  CBC: Recent Labs  Lab 09/04/19 1646 09/05/19 0524 09/06/19 0753 09/09/19 0409  WBC 15.1* 9.9  --  8.5  NEUTROABS 12.5*  --   --   --   HGB 11.1* 9.2* 9.7* 9.4*  HCT 35.5* 29.1*  --  29.0*  MCV 84.9 84.8  --  83.6  PLT 286 251  --  123456   Basic Metabolic Panel: Recent Labs  Lab 09/04/19 1646 09/05/19 0524  NA 131* 135  K 3.7 4.2  CL 97* 100  CO2 23 24  GLUCOSE 239* 233*  BUN 10 11  CREATININE 0.55 0.49  CALCIUM 9.3 8.8*   GFR: Estimated Creatinine Clearance: 49.1 mL/min (by C-G formula based on SCr of 0.49 mg/dL). Liver Function Tests: Recent Labs  Lab 09/04/19 1646  AST 16  ALT 14  ALKPHOS 55  BILITOT 0.6  PROT 7.0  ALBUMIN 3.9   No results for input(s): LIPASE, AMYLASE in the last 168 hours. No results for input(s): AMMONIA in the last 168 hours. Coagulation Profile: Recent Labs  Lab 09/04/19 1646  INR 0.9   Cardiac Enzymes: No results for input(s): CKTOTAL, CKMB, CKMBINDEX, TROPONINI in the last 168 hours. BNP (last 3 results) No results for input(s): PROBNP in the last 8760 hours. HbA1C: No results for input(s): HGBA1C in the last 72 hours. CBG: Recent Labs  Lab 09/09/19 2148 09/10/19 0753 09/10/19 1127 09/10/19 1626 09/10/19 1930  GLUCAP 191* 195* 314* 132* 148*   Lipid  Profile: No results for input(s): CHOL, HDL, LDLCALC, TRIG, CHOLHDL, LDLDIRECT in the last 72 hours. Thyroid Function Tests: No results for input(s): TSH, T4TOTAL, FREET4, T3FREE, THYROIDAB in the last 72 hours. Anemia Panel: No results for input(s): VITAMINB12, FOLATE, FERRITIN, TIBC, IRON, RETICCTPCT in the last 72 hours. Sepsis Labs: No results for input(s): PROCALCITON, LATICACIDVEN in the last 168 hours.  Recent Results (from the past 240 hour(s))  Urine culture     Status: Abnormal   Collection Time: 09/04/19  8:00 PM   Specimen: Urine, Clean Catch  Result Value Ref Range Status   Specimen Description   Final    URINE, CLEAN CATCH Performed at [facility], [address] [place], [place] [address]    Special Requests   Final    NONE Performed at [facility], [place] [address] [place], [place] [address]    Culture MULTIPLE SPECIES PRESENT, SUGGEST RECOLLECTION (A)  Final   Report Status 09/07/2019 FINAL  Final  SARS CORONAVIRUS 2 (TAT 6-24 HRS) Nasopharyngeal Nasopharyngeal Swab     Status: None   Collection Time: 09/04/19  9:25 PM   Specimen: Nasopharyngeal Swab  Result Value Ref Range Status   SARS Coronavirus 2 NEGATIVE NEGATIVE Final    Comment: (NOTE) SARS-CoV-2 target nucleic acids are NOT DETECTED. The SARS-CoV-2 RNA is generally detectable in upper and lower respiratory specimens during the acute phase of infection. Negative results do not preclude SARS-CoV-2 infection, do not rule out co-infections with other pathogens, and should not be used as the sole basis for treatment or other patient management decisions. Negative results must be combined with clinical observations, patient history, and epidemiological information. The expected result is Negative. Fact Sheet for Patients: SugarRoll.be Fact Sheet for Healthcare Providers: https://www.woods-mathews.com/ This test is not yet approved or cleared by  the [place] FDA and  has been authorized for detection and/or diagnosis of SARS-CoV-2 by FDA under an Emergency Use Authorization (EUA). This EUA will remain  in effect (meaning this test can be used) for the duration of the COVID-19 declaration under Section 56 4(b)(1) of the Act, 21 U.S.C. section 360bbb-3(b)(1), unless the authorization is terminated or revoked sooner. Performed at [facility], [place] [address], [place], [place] [address]   SARS CORONAVIRUS 2 (TAT 6-24 HRS) Nasopharyngeal Nasopharyngeal Swab     Status: None   Collection Time: 09/07/19 10:44 AM   Specimen: Nasopharyngeal Swab  Result Value Ref Range Status   SARS Coronavirus 2 NEGATIVE NEGATIVE Final    Comment: (NOTE) SARS-CoV-2 target nucleic acids are NOT DETECTED. The SARS-CoV-2 RNA is generally detectable in upper and lower respiratory specimens during the acute phase of infection. Negative results do not preclude SARS-CoV-2 infection, do not rule out co-infections with other pathogens, and should not be used as the sole basis for treatment or other patient  management decisions. Negative results must be combined with clinical observations, patient history, and epidemiological information. The expected result is Negative. Fact Sheet for Patients: SugarRoll.be Fact Sheet for Healthcare Providers: https://www.woods-mathews.com/ This test is not yet approved or cleared by the [place] FDA and  has been authorized for detection and/or diagnosis of SARS-CoV-2 by FDA under an Emergency Use Authorization (EUA). This EUA will remain  in effect (meaning this test can be used) for the duration of the COVID-19 declaration under Section 56 4(b)(1) of the Act, 21 U.S.C. section 360bbb-3(b)(1), unless the authorization is terminated or revoked sooner. Performed at [facility], [place] [address], [place], [place] [address]          Radiology Studies: No  results found.      Scheduled Meds: . aspirin EC  81 mg Oral Daily  . citalopram  20 mg Oral Daily  . conjugated estrogens  1 Applicatorful Vaginal Q M,W,F-2000  . famotidine  40 mg Oral Daily  . glipiZIDE  10 mg Oral Q breakfast  . insulin aspart  0-9 Units Subcutaneous TID WC  . insulin glargine  10 Units Subcutaneous Daily  . lisinopril  40 mg Oral Daily  . metFORMIN  1,000 mg Oral BID WC  . metoprolol succinate  50 mg Oral Daily  . senna  1 tablet Oral BID  . simvastatin  20 mg Oral QPM  . cyanocobalamin  1,000 mcg Oral Daily   Continuous Infusions: . sodium chloride Stopped (09/05/19 0455)     LOS: 6 days    Time spent: 17mins    Kathie Dike, MD [facility]   If 7PM-7AM, please contact night-coverage [email or web address]  09/10/2019, 8:15 PM

## 2019-09-11 LAB — GLUCOSE, CAPILLARY
Glucose-Capillary: 166 mg/dL — ABNORMAL HIGH (ref 70–99)
Glucose-Capillary: 286 mg/dL — ABNORMAL HIGH (ref 70–99)
Glucose-Capillary: 72 mg/dL (ref 70–99)
Glucose-Capillary: 219 mg/dL — ABNORMAL HIGH (ref 70–99)

## 2019-09-11 NOTE — Progress Notes (Signed)
PROGRESS NOTE    Laura Bell  [id number] DOB: [birth year] DOA: 09/04/2019 PCP: Abner Greenspan, MD    Brief Narrative:  83 year old female with a history of hypertension, type 2 diabetes, presents after mechanical fall.  She suffered superior and inferior pubic rami fracture.  Was also noted to have a significant right hip/thigh hematoma.  She was admitted for further treatments.   Assessment & Plan:   Principal Problem:   Pubic ramus fracture (HCC) Active Problems:   Diabetes type 2, uncontrolled ([place])   Hyperlipidemia associated with type 2 diabetes mellitus ([place])   Essential hypertension   GERD   Hematoma of right thigh   1. Right-sided superior and inferior pubic rami fracture with right hip/thigh hematoma.  Status post mechanical fall.  Seen by orthopedics with recommendations for serial CBC.  Hemoglobin has been stable. Continue on pain management.  She is weightbearing as tolerated.  Seen by PT with recommendations for skilled nursing facility. 2. Urinary tract infection.  Patient had a significant leukocytosis.  Urinalysis indicated moderate leukocytes and many WBCs.  She was treated with IV Rocephin and transitioned to oral Keflex. She has completed her course of antibiotics 3. Hypertension.  Blood pressure stable on lisinopril metoprolol. 4. Diabetes.  Continue on glipizide XL and Metformin.  Continue on sliding scale insulin. Started on lantus for uncontrolled blood sugars 5. Hyperlipidemia.  Continue statin.   DVT prophylaxis: Lovenox Code Status: Full code Family Communication: None present Disposition Plan: Awaiting placement to skilled nursing facility insurance authorization. Patient lives at home alone and does not have 24 hour supervision/assistance.   Consultants:   Orthopedics  Procedures:     Antimicrobials:       Subjective: No new complaints.  No chest pain or shortness of breath. Objective: Vitals:   09/10/19 2020 09/11/19 0541  09/11/19 0930 09/11/19 1337  BP: 132/74 (!) 144/73 133/63 118/63  Pulse: 94 (!) 102 (!) 102 92  Resp: 20 18    Temp: 97.6 F (36.4 C) 98.3 F (36.8 C)  98.4 F (36.9 C)  TempSrc: Oral Oral  Oral  SpO2: 98% 97%  97%  Weight:      Height:        Intake/Output Summary (Last 24 hours) at 09/11/2019 1817 Last data filed at 09/11/2019 1603 Gross per 24 hour  Intake 340 ml  Output 752 ml  Net -412 ml   Filed Weights   09/04/19 1448  Weight: 65.8 kg    Examination:  General exam: Alert, awake, oriented x 3 Respiratory system: Clear to auscultation. Respiratory effort normal. Cardiovascular system:RRR. No murmurs, rubs, gallops.   Data Reviewed: I have personally reviewed following labs and imaging studies  CBC: Recent Labs  Lab 09/05/19 0524 09/06/19 0753 09/09/19 0409  WBC 9.9  --  8.5  HGB 9.2* 9.7* 9.4*  HCT 29.1*  --  29.0*  MCV 84.8  --  83.6  PLT 251  --  123456   Basic Metabolic Panel: Recent Labs  Lab 09/05/19 0524  NA 135  K 4.2  CL 100  CO2 24  GLUCOSE 233*  BUN 11  CREATININE 0.49  CALCIUM 8.8*   GFR: Estimated Creatinine Clearance: 49.1 mL/min (by C-G formula based on SCr of 0.49 mg/dL). Liver Function Tests: No results for input(s): AST, ALT, ALKPHOS, BILITOT, PROT, ALBUMIN in the last 168 hours. No results for input(s): LIPASE, AMYLASE in the last 168 hours. No results for input(s): AMMONIA in the last 168 hours. Coagulation Profile:  No results for input(s): INR, PROTIME in the last 168 hours. Cardiac Enzymes: No results for input(s): CKTOTAL, CKMB, CKMBINDEX, TROPONINI in the last 168 hours. BNP (last 3 results) No results for input(s): PROBNP in the last 8760 hours. HbA1C: No results for input(s): HGBA1C in the last 72 hours. CBG: Recent Labs  Lab 09/10/19 1626 09/10/19 1930 09/11/19 0733 09/11/19 1128 09/11/19 1712  GLUCAP 132* 148* 166* 286* 72   Lipid Profile: No results for input(s): CHOL, HDL, LDLCALC, TRIG, CHOLHDL,  LDLDIRECT in the last 72 hours. Thyroid Function Tests: No results for input(s): TSH, T4TOTAL, FREET4, T3FREE, THYROIDAB in the last 72 hours. Anemia Panel: No results for input(s): VITAMINB12, FOLATE, FERRITIN, TIBC, IRON, RETICCTPCT in the last 72 hours. Sepsis Labs: No results for input(s): PROCALCITON, LATICACIDVEN in the last 168 hours.  Recent Results (from the past 240 hour(s))  Urine culture     Status: Abnormal   Collection Time: 09/04/19  8:00 PM   Specimen: Urine, Clean Catch  Result Value Ref Range Status   Specimen Description   Final    URINE, CLEAN CATCH Performed at [facility], [address], [place], [place] [address]    Special Requests   Final    NONE Performed at [facility], [place] [address] [place], [place] [address]    Culture MULTIPLE SPECIES PRESENT, SUGGEST RECOLLECTION (A)  Final   Report Status 09/07/2019 FINAL  Final  SARS CORONAVIRUS 2 (TAT 6-24 HRS) Nasopharyngeal Nasopharyngeal Swab     Status: None   Collection Time: 09/04/19  9:25 PM   Specimen: Nasopharyngeal Swab  Result Value Ref Range Status   SARS Coronavirus 2 NEGATIVE NEGATIVE Final    Comment: (NOTE) SARS-CoV-2 target nucleic acids are NOT DETECTED. The SARS-CoV-2 RNA is generally detectable in upper and lower respiratory specimens during the acute phase of infection. Negative results do not preclude SARS-CoV-2 infection, do not rule out co-infections with other pathogens, and should not be used as the sole basis for treatment or other patient management decisions. Negative results must be combined with clinical observations, patient history, and epidemiological information. The expected result is Negative. Fact Sheet for Patients: SugarRoll.be Fact Sheet for Healthcare Providers: https://www.woods-mathews.com/ This test is not yet approved or cleared by the [place] FDA and  has been authorized for detection and/or  diagnosis of SARS-CoV-2 by FDA under an Emergency Use Authorization (EUA). This EUA will remain  in effect (meaning this test can be used) for the duration of the COVID-19 declaration under Section 56 4(b)(1) of the Act, 21 U.S.C. section 360bbb-3(b)(1), unless the authorization is terminated or revoked sooner. Performed at [facility], [place] [address], [place], [place] [address]   SARS CORONAVIRUS 2 (TAT 6-24 HRS) Nasopharyngeal Nasopharyngeal Swab     Status: None   Collection Time: 09/07/19 10:44 AM   Specimen: Nasopharyngeal Swab  Result Value Ref Range Status   SARS Coronavirus 2 NEGATIVE NEGATIVE Final    Comment: (NOTE) SARS-CoV-2 target nucleic acids are NOT DETECTED. The SARS-CoV-2 RNA is generally detectable in upper and lower respiratory specimens during the acute phase of infection. Negative results do not preclude SARS-CoV-2 infection, do not rule out co-infections with other pathogens, and should not be used as the sole basis for treatment or other patient management decisions. Negative results must be combined with clinical observations, patient history, and epidemiological information. The expected result is Negative. Fact Sheet for Patients: SugarRoll.be Fact Sheet for Healthcare Providers: https://www.woods-mathews.com/ This test  is not yet approved or cleared by the [place] and  has been authorized for detection and/or diagnosis of SARS-CoV-2 by FDA under an Emergency Use Authorization (EUA). This EUA will remain  in effect (meaning this test can be used) for the duration of the COVID-19 declaration under Section 56 4(b)(1) of the Act, 21 U.S.C. section 360bbb-3(b)(1), unless the authorization is terminated or revoked sooner. Performed at [facility], [place] [address] [place], [place] [address]          Radiology Studies: No results found.      Scheduled Meds: . aspirin EC  81 mg Oral  Daily  . citalopram  20 mg Oral Daily  . conjugated estrogens  1 Applicatorful Vaginal Q M,W,F-2000  . famotidine  40 mg Oral Daily  . glipiZIDE  10 mg Oral Q breakfast  . insulin aspart  0-9 Units Subcutaneous TID WC  . insulin glargine  10 Units Subcutaneous Daily  . lisinopril  40 mg Oral Daily  . metFORMIN  1,000 mg Oral BID WC  . metoprolol succinate  50 mg Oral Daily  . senna  1 tablet Oral BID  . simvastatin  20 mg Oral QPM  . cyanocobalamin  1,000 mcg Oral Daily   Continuous Infusions: . sodium chloride Stopped (09/05/19 0455)     LOS: 7 days    Time spent: 65mins    Kathie Dike, MD [facility]   If 7PM-7AM, please contact night-coverage [email or web address]  09/11/2019, 6:17 PM

## 2019-09-11 NOTE — Progress Notes (Signed)
Physical Therapy Treatment Patient Details Name: Laura Bell MRN: [id number] DOB: [birth year] Today's Date: 09/11/2019    History of Present Illness Laura Bell is a 83 y.o. female with medical history significant of history of breast and cervical cancer, hypertension, type 2 diabetes who presents for concerns of a fall.  Patient was going down the stairs today and thinks she might of missed a step and fell Right sided superior and inferior pubic rami factures    PT Comments    Pt in bed.  Inc large liquid BM.  Stated she had a laxative this am.  Rolling with min a for care and transitioned to sitting EOB.  Care provided by tech.  She was then able to walk 25' in room with slow step to gait.  Participated in exercises as described below.  Gait remains limited by pain and fatigue of UE due to her using them to offload R LE during gait.  She remains motivated however "I want to do for myself again."  SNF remains appropriate for discharge.   Follow Up Recommendations  SNF     Equipment Recommendations  Rolling walker with 5" wheels    Recommendations for Other Services       Precautions / Restrictions Precautions Precautions: Fall Restrictions Weight Bearing Restrictions: Yes RLE Weight Bearing: Weight bearing as tolerated    Mobility  Bed Mobility Overal bed mobility: Needs Assistance       Supine to sit: Min guard     General bed mobility comments: assist given today due to inc BM and care provided during transitions.  Transfers Overall transfer level: Needs assistance Equipment used: Rolling walker (2 wheeled) Transfers: Sit to/from Stand Sit to Stand: Min guard            Ambulation/Gait Ambulation/Gait assistance: Min guard Gait Distance (Feet): 25 Feet Assistive device: Rolling walker (2 wheeled) Gait Pattern/deviations: Step-to pattern;Decreased step length - left;Decreased stance time - right;Antalgic Gait velocity: decreased   General  Gait Details: Mildly antalgic gait pattern on the RLE but steady without LOB; mod verbal and visual cues for proper sequencing   Stairs             Wheelchair Mobility    Modified Rankin (Stroke Patients Only)       Balance Overall balance assessment: Needs assistance Sitting-balance support: No upper extremity supported;Feet supported Sitting balance-Leahy Scale: Good     Standing balance support: Bilateral upper extremity supported Standing balance-Leahy Scale: Good                              Cognition Arousal/Alertness: Awake/alert Behavior During Therapy: WFL for tasks assessed/performed Overall Cognitive Status: Within Functional Limits for tasks assessed                                        Exercises Other Exercises Other Exercises: seated ankle pumps and LAQ x 10 AROM, AAROM for marches in sitting x 10    General Comments        Pertinent Vitals/Pain Pain Assessment: Faces Faces Pain Scale: Hurts even more Pain Location: R hip - with gait Pain Descriptors / Indicators: Aching;Sore Pain Intervention(s): Limited activity within patient's tolerance;Monitored during session;Repositioned    Home Living  Prior Function            PT Goals (current goals can now be found in the care plan section) Progress towards PT goals: Progressing toward goals    Frequency    7X/week      PT Plan Current plan remains appropriate    Co-evaluation              AM-PAC PT "6 Clicks" Mobility   Outcome Measure  Help needed turning from your back to your side while in a flat bed without using bedrails?: A Little Help needed moving from lying on your back to sitting on the side of a flat bed without using bedrails?: A Little Help needed moving to and from a bed to a chair (including a wheelchair)?: A Little Help needed standing up from a chair using your arms (e.g., wheelchair or bedside  chair)?: A Little Help needed to walk in hospital room?: A Little Help needed climbing 3-5 steps with a railing? : A Lot 6 Click Score: 17    End of Session Equipment Utilized During Treatment: Gait belt Activity Tolerance: Patient tolerated treatment well Patient left: in chair;with chair alarm set;with call bell/phone within reach;with nursing/sitter in room Nurse Communication: Mobility status       Time: [id number] PT Time Calculation (min) (ACUTE ONLY): 23 min  Charges:  $Gait Training: 8-22 mins $Therapeutic Exercise: 8-22 mins                    Chesley Noon, PTA 09/11/19, 10:55 AM

## 2019-09-11 NOTE — Plan of Care (Signed)
Patient progressing well.  Transferred from chair to bed x1 assist. Pain controlled with PO 1tab as needed. Family at bedside for most of the day. External catheter in use per patient request due to limited mobility and increased time needed to avoid incontinence. Olevia Bowens RN 09/11/19 1650

## 2019-09-12 LAB — GLUCOSE, CAPILLARY
Glucose-Capillary: 143 mg/dL — ABNORMAL HIGH (ref 70–99)
Glucose-Capillary: 220 mg/dL — ABNORMAL HIGH (ref 70–99)
Glucose-Capillary: 125 mg/dL — ABNORMAL HIGH (ref 70–99)
Glucose-Capillary: 136 mg/dL — ABNORMAL HIGH (ref 70–99)

## 2019-09-12 NOTE — Progress Notes (Addendum)
Physical Therapy Treatment Patient Details Name: Laura Bell MRN: [id number] DOB: [birth year] Today's Date: 09/12/2019    History of Present Illness Laura Bell is a 83 y.o. female with medical history significant of history of breast and cervical cancer, hypertension, type 2 diabetes who presents for concerns of a fall.  Patient was going down the stairs today and thinks she might of missed a step and fell Right sided superior and inferior pubic rami factures    PT Comments    Pt in bed upon entry, agreeable to participate. Pt improved in facility with bed mobility and transfers, but still fatigues easily. Pt's AMB remains slow and suggestive of falls risk. Pt unable to progress distance any further this date. No cues needed for RW use, as pt exhibits safe use.    Follow Up Recommendations  SNF     Equipment Recommendations  Rolling walker with 5" wheels    Recommendations for Other Services       Precautions / Restrictions Precautions Precautions: Fall Restrictions RLE Weight Bearing: Weight bearing as tolerated    Mobility  Bed Mobility Overal bed mobility: Needs Assistance Bed Mobility: Supine to Sit     Supine to sit: Modified independent (Device/Increase time)        Transfers Overall transfer level: Needs assistance Equipment used: Rolling walker (2 wheeled) Transfers: Sit to/from Stand Sit to Stand: Supervision            Ambulation/Gait Ambulation/Gait assistance: Min guard Gait Distance (Feet): 38 Feet Assistive device: Rolling walker (2 wheeled) Gait Pattern/deviations: Step-to pattern;Decreased step length - left;Decreased stance time - right;Antalgic Gait velocity: 0.023m/s (very, very slow)   General Gait Details: Mildly antalgic gait pattern on the RLE but steady without LOB; mod verbal and visual cues for proper sequencing   Stairs             Wheelchair Mobility    Modified Rankin (Stroke Patients Only)        Balance Overall balance assessment: Needs assistance Sitting-balance support: No upper extremity supported;Feet supported Sitting balance-Leahy Scale: Good     Standing balance support: Bilateral upper extremity supported Standing balance-Leahy Scale: Fair Standing balance comment: relies on RW for support.                            Cognition Arousal/Alertness: Awake/alert Behavior During Therapy: WFL for tasks assessed/performed Overall Cognitive Status: Within Functional Limits for tasks assessed                                        Exercises Other Exercises Other Exercises: seated LAQ 1x20 bilat Other Exercises: 5xSTS from chair, BUE use permitted from chair arms    General Comments        Pertinent Vitals/Pain Pain Assessment: 0-10 Pain Score: 5  Pain Location: R hip - with gait Pain Descriptors / Indicators: Aching;Sore Pain Intervention(s): Limited activity within patient's tolerance;Monitored during session    Home Living                      Prior Function            PT Goals (current goals can now be found in the care plan section) Acute Rehab PT Goals Patient Stated Goal: to walk PT Goal Formulation: With patient Time For Goal Achievement: 09/19/19 Potential to  Achieve Goals: Good Progress towards PT goals: Progressing toward goals    Frequency    7X/week      PT Plan Current plan remains appropriate    Co-evaluation              AM-PAC PT "6 Clicks" Mobility   Outcome Measure  Help needed turning from your back to your side while in a flat bed without using bedrails?: A Little Help needed moving from lying on your back to sitting on the side of a flat bed without using bedrails?: A Little Help needed moving to and from a bed to a chair (including a wheelchair)?: A Little Help needed standing up from a chair using your arms (e.g., wheelchair or bedside chair)?: A Little Help needed to walk in  hospital room?: A Little Help needed climbing 3-5 steps with a railing? : A Lot 6 Click Score: 17    End of Session Equipment Utilized During Treatment: Gait belt Activity Tolerance: Patient tolerated treatment well;Patient limited by fatigue;Patient limited by pain Patient left: in chair;with chair alarm set;with call bell/phone within reach;with nursing/sitter in room Nurse Communication: Mobility status PT Visit Diagnosis: Unsteadiness on feet (R26.81);Repeated falls (R29.6);Muscle weakness (generalized) (M62.81);Difficulty in walking, not elsewhere classified (R26.2)     Time: [id number] PT Time Calculation (min) (ACUTE ONLY): 15 min  Charges:  $Therapeutic Exercise: 8-22 mins                     1:27 PM, 09/12/19 Etta Grandchild, PT, DPT Physical Therapist - [facility]  [phone] ([place])    Laura Bell C 09/12/2019, 1:26 PM

## 2019-09-12 NOTE — Progress Notes (Signed)
PROGRESS NOTE    Laura Bell  [id number] DOB: Apr 21, 1936 DOA: 09/04/2019 PCP: Abner Greenspan, MD    Brief Narrative:  83 year old female with a history of hypertension, type 2 diabetes, presents after mechanical fall.  She suffered superior and inferior pubic rami fracture.  Was also noted to have a significant right hip/thigh hematoma.  She was admitted for further treatments.   Assessment & Plan:   Principal Problem:   Pubic ramus fracture (HCC) Active Problems:   Diabetes type 2, uncontrolled ([place])   Hyperlipidemia associated with type 2 diabetes mellitus ([place])   Essential hypertension   GERD   Hematoma of right thigh   1. Right-sided superior and inferior pubic rami fracture with right hip/thigh hematoma.  Status post mechanical fall.  Seen by orthopedics with recommendations for serial CBC.  Hemoglobin has been stable. Continue on pain management.  She is weightbearing as tolerated.  Seen by PT with recommendations for skilled nursing facility. 2. Urinary tract infection.  Patient had a significant leukocytosis.  Urinalysis indicated moderate leukocytes and many WBCs.  She was treated with IV Rocephin and transitioned to oral Keflex. She has completed her course of antibiotics 3. Hypertension.  Blood pressure stable on lisinopril metoprolol. 4. Diabetes.  Continue on glipizide XL and Metformin.  Continue on sliding scale insulin. Started on lantus for uncontrolled blood sugars 5. Hyperlipidemia.  Continue statin.   DVT prophylaxis: Lovenox Code Status: Full code Family Communication: None present Disposition Plan: Awaiting placement to skilled nursing facility insurance authorization. Patient lives at home alone and does not have 24 hour supervision/assistance.   Consultants:   Orthopedics  Procedures:     Antimicrobials:       Subjective: Pain is controlled, no shortness of breath  Objective: Vitals:   09/11/19 2039 09/12/19 0458 09/12/19 0758  09/12/19 1550  BP: 127/66 (!) 144/66 135/65 (!) 118/50  Pulse: 94 94 88 89  Resp: 20 20 18 18   Temp: 97.8 F (36.6 C) 98 F (36.7 C) 98.4 F (36.9 C) 98.6 F (37 C)  TempSrc: Oral Oral Oral Oral  SpO2: 97% 95% 95% 96%  Weight:      Height:        Intake/Output Summary (Last 24 hours) at 09/12/2019 1601 Last data filed at 09/12/2019 1423 Gross per 24 hour  Intake 240 ml  Output 350 ml  Net -110 ml   Filed Weights   09/04/19 1448  Weight: 65.8 kg    Examination:  General exam: Alert, awake, oriented x 3 Respiratory system: Clear to auscultation. Respiratory effort normal. Cardiovascular system:RRR. No murmurs, rubs, gallops.  Data Reviewed: I have personally reviewed following labs and imaging studies  CBC: Recent Labs  Lab 09/06/19 0753 09/09/19 0409  WBC  --  8.5  HGB 9.7* 9.4*  HCT  --  29.0*  MCV  --  83.6  PLT  --  123456   Basic Metabolic Panel: No results for input(s): NA, K, CL, CO2, GLUCOSE, BUN, CREATININE, CALCIUM, MG, PHOS in the last 168 hours. GFR: Estimated Creatinine Clearance: 49.1 mL/min (by C-G formula based on SCr of 0.49 mg/dL). Liver Function Tests: No results for input(s): AST, ALT, ALKPHOS, BILITOT, PROT, ALBUMIN in the last 168 hours. No results for input(s): LIPASE, AMYLASE in the last 168 hours. No results for input(s): AMMONIA in the last 168 hours. Coagulation Profile: No results for input(s): INR, PROTIME in the last 168 hours. Cardiac Enzymes: No results for input(s): CKTOTAL, CKMB, CKMBINDEX, TROPONINI in  the last 168 hours. BNP (last 3 results) No results for input(s): PROBNP in the last 8760 hours. HbA1C: No results for input(s): HGBA1C in the last 72 hours. CBG: Recent Labs  Lab 09/11/19 1128 09/11/19 1712 09/11/19 2133 09/12/19 0755 09/12/19 1123  GLUCAP 286* 72 219* 143* 220*   Lipid Profile: No results for input(s): CHOL, HDL, LDLCALC, TRIG, CHOLHDL, LDLDIRECT in the last 72 hours. Thyroid Function Tests: No  results for input(s): TSH, T4TOTAL, FREET4, T3FREE, THYROIDAB in the last 72 hours. Anemia Panel: No results for input(s): VITAMINB12, FOLATE, FERRITIN, TIBC, IRON, RETICCTPCT in the last 72 hours. Sepsis Labs: No results for input(s): PROCALCITON, LATICACIDVEN in the last 168 hours.  Recent Results (from the past 240 hour(s))  Urine culture     Status: Abnormal   Collection Time: 09/04/19  8:00 PM   Specimen: Urine, Clean Catch  Result Value Ref Range Status   Specimen Description   Final    URINE, CLEAN CATCH Performed at [facility], [address] [place], [place] [address]    Special Requests   Final    NONE Performed at [facility], [place] [address], [place], [place] [address]    Culture MULTIPLE SPECIES PRESENT, SUGGEST RECOLLECTION (A)  Final   Report Status 09/07/2019 FINAL  Final  SARS CORONAVIRUS 2 (TAT 6-24 HRS) Nasopharyngeal Nasopharyngeal Swab     Status: None   Collection Time: 09/04/19  9:25 PM   Specimen: Nasopharyngeal Swab  Result Value Ref Range Status   SARS Coronavirus 2 NEGATIVE NEGATIVE Final    Comment: (NOTE) SARS-CoV-2 target nucleic acids are NOT DETECTED. The SARS-CoV-2 RNA is generally detectable in upper and lower respiratory specimens during the acute phase of infection. Negative results do not preclude SARS-CoV-2 infection, do not rule out co-infections with other pathogens, and should not be used as the sole basis for treatment or other patient management decisions. Negative results must be combined with clinical observations, patient history, and epidemiological information. The expected result is Negative. Fact Sheet for Patients: SugarRoll.be Fact Sheet for Healthcare Providers: https://www.woods-mathews.com/ This test is not yet approved or cleared by the [place] FDA and  has been authorized for detection and/or diagnosis of SARS-CoV-2 by FDA under an Emergency Use  Authorization (EUA). This EUA will remain  in effect (meaning this test can be used) for the duration of the COVID-19 declaration under Section 56 4(b)(1) of the Act, 21 U.S.C. section 360bbb-3(b)(1), unless the authorization is terminated or revoked sooner. Performed at [facility], [place] [address], [place], [place] [address]   SARS CORONAVIRUS 2 (TAT 6-24 HRS) Nasopharyngeal Nasopharyngeal Swab     Status: None   Collection Time: 09/07/19 10:44 AM   Specimen: Nasopharyngeal Swab  Result Value Ref Range Status   SARS Coronavirus 2 NEGATIVE NEGATIVE Final    Comment: (NOTE) SARS-CoV-2 target nucleic acids are NOT DETECTED. The SARS-CoV-2 RNA is generally detectable in upper and lower respiratory specimens during the acute phase of infection. Negative results do not preclude SARS-CoV-2 infection, do not rule out co-infections with other pathogens, and should not be used as the sole basis for treatment or other patient management decisions. Negative results must be combined with clinical observations, patient history, and epidemiological information. The expected result is Negative. Fact Sheet for Patients: SugarRoll.be Fact Sheet for Healthcare Providers: https://www.woods-mathews.com/ This test is not yet approved or cleared by the [place] FDA and  has been authorized for detection and/or diagnosis of SARS-CoV-2  by FDA under an Emergency Use Authorization (EUA). This EUA will remain  in effect (meaning this test can be used) for the duration of the COVID-19 declaration under Section 56 4(b)(1) of the Act, 21 U.S.C. section 360bbb-3(b)(1), unless the authorization is terminated or revoked sooner. Performed at [facility], [place] [address] [place], [place] [address]          Radiology Studies: No results found.      Scheduled Meds: . aspirin EC  81 mg Oral Daily  . citalopram  20 mg Oral Daily  . conjugated  estrogens  1 Applicatorful Vaginal Q M,W,F-2000  . famotidine  40 mg Oral Daily  . glipiZIDE  10 mg Oral Q breakfast  . insulin aspart  0-9 Units Subcutaneous TID WC  . insulin glargine  10 Units Subcutaneous Daily  . lisinopril  40 mg Oral Daily  . metFORMIN  1,000 mg Oral BID WC  . metoprolol succinate  50 mg Oral Daily  . senna  1 tablet Oral BID  . simvastatin  20 mg Oral QPM  . cyanocobalamin  1,000 mcg Oral Daily   Continuous Infusions: . sodium chloride Stopped (09/05/19 0455)     LOS: 8 days    Time spent: 50mins    Kathie Dike, MD [facility]   If 7PM-7AM, please contact night-coverage [email or web address]  09/12/2019, 4:01 PM

## 2019-09-13 DIAGNOSIS — S32591A Other specified fracture of right pubis, initial encounter for closed fracture: Secondary | ICD-10-CM | POA: Diagnosis not present

## 2019-09-13 DIAGNOSIS — E1169 Type 2 diabetes mellitus with other specified complication: Secondary | ICD-10-CM | POA: Diagnosis not present

## 2019-09-13 DIAGNOSIS — E785 Hyperlipidemia, unspecified: Secondary | ICD-10-CM | POA: Diagnosis not present

## 2019-09-13 DIAGNOSIS — D649 Anemia, unspecified: Secondary | ICD-10-CM | POA: Diagnosis not present

## 2019-09-13 DIAGNOSIS — Z794 Long term (current) use of insulin: Secondary | ICD-10-CM | POA: Diagnosis not present

## 2019-09-13 DIAGNOSIS — M2550 Pain in unspecified joint: Secondary | ICD-10-CM | POA: Diagnosis not present

## 2019-09-13 DIAGNOSIS — R5381 Other malaise: Secondary | ICD-10-CM | POA: Diagnosis not present

## 2019-09-13 DIAGNOSIS — R6889 Other general symptoms and signs: Secondary | ICD-10-CM | POA: Diagnosis not present

## 2019-09-13 DIAGNOSIS — D519 Vitamin B12 deficiency anemia, unspecified: Secondary | ICD-10-CM | POA: Diagnosis not present

## 2019-09-13 DIAGNOSIS — M6281 Muscle weakness (generalized): Secondary | ICD-10-CM | POA: Diagnosis not present

## 2019-09-13 DIAGNOSIS — R69 Illness, unspecified: Secondary | ICD-10-CM | POA: Diagnosis not present

## 2019-09-13 DIAGNOSIS — E1165 Type 2 diabetes mellitus with hyperglycemia: Secondary | ICD-10-CM | POA: Diagnosis not present

## 2019-09-13 DIAGNOSIS — S32511D Fracture of superior rim of right pubis, subsequent encounter for fracture with routine healing: Secondary | ICD-10-CM | POA: Diagnosis not present

## 2019-09-13 DIAGNOSIS — K219 Gastro-esophageal reflux disease without esophagitis: Secondary | ICD-10-CM | POA: Diagnosis not present

## 2019-09-13 DIAGNOSIS — Z7401 Bed confinement status: Secondary | ICD-10-CM | POA: Diagnosis not present

## 2019-09-13 DIAGNOSIS — K5909 Other constipation: Secondary | ICD-10-CM | POA: Diagnosis not present

## 2019-09-13 DIAGNOSIS — R41841 Cognitive communication deficit: Secondary | ICD-10-CM | POA: Diagnosis not present

## 2019-09-13 DIAGNOSIS — E119 Type 2 diabetes mellitus without complications: Secondary | ICD-10-CM | POA: Diagnosis not present

## 2019-09-13 DIAGNOSIS — S7001XD Contusion of right hip, subsequent encounter: Secondary | ICD-10-CM | POA: Diagnosis not present

## 2019-09-13 DIAGNOSIS — S7011XD Contusion of right thigh, subsequent encounter: Secondary | ICD-10-CM | POA: Diagnosis not present

## 2019-09-13 DIAGNOSIS — E7849 Other hyperlipidemia: Secondary | ICD-10-CM | POA: Diagnosis not present

## 2019-09-13 DIAGNOSIS — I10 Essential (primary) hypertension: Secondary | ICD-10-CM | POA: Diagnosis not present

## 2019-09-13 DIAGNOSIS — W19XXXA Unspecified fall, initial encounter: Secondary | ICD-10-CM | POA: Diagnosis not present

## 2019-09-13 DIAGNOSIS — E559 Vitamin D deficiency, unspecified: Secondary | ICD-10-CM | POA: Diagnosis not present

## 2019-09-13 DIAGNOSIS — Z03818 Encounter for observation for suspected exposure to other biological agents ruled out: Secondary | ICD-10-CM | POA: Diagnosis not present

## 2019-09-13 DIAGNOSIS — R2681 Unsteadiness on feet: Secondary | ICD-10-CM | POA: Diagnosis not present

## 2019-09-13 DIAGNOSIS — D518 Other vitamin B12 deficiency anemias: Secondary | ICD-10-CM | POA: Diagnosis not present

## 2019-09-13 DIAGNOSIS — S7011XA Contusion of right thigh, initial encounter: Secondary | ICD-10-CM | POA: Diagnosis not present

## 2019-09-13 DIAGNOSIS — N905 Atrophy of vulva: Secondary | ICD-10-CM | POA: Diagnosis not present

## 2019-09-13 DIAGNOSIS — S32810A Multiple fractures of pelvis with stable disruption of pelvic ring, initial encounter for closed fracture: Secondary | ICD-10-CM | POA: Diagnosis not present

## 2019-09-13 LAB — GLUCOSE, CAPILLARY
Glucose-Capillary: 133 mg/dL — ABNORMAL HIGH (ref 70–99)
Glucose-Capillary: 231 mg/dL — ABNORMAL HIGH (ref 70–99)

## 2019-09-13 MED ORDER — INSULIN GLARGINE 100 UNIT/ML ~~LOC~~ SOLN: 10.0000 [IU] | Freq: Every day | SUBCUTANEOUS | 11 refills | Status: DC

## 2019-09-13 MED ORDER — HYDROCODONE-ACETAMINOPHEN 5-325 MG PO TABS: 1.0000 | ORAL_TABLET | ORAL | 0 refills | Status: DC | PRN

## 2019-09-13 MED ORDER — ASPIRIN EC 325 MG PO TBEC: 325.0000 mg | DELAYED_RELEASE_TABLET | Freq: Two times a day (BID) | ORAL | 3 refills | Status: DC

## 2019-09-13 MED ORDER — POLYETHYLENE GLYCOL 3350 17 G PO PACK: 17.0000 g | PACK | Freq: Every day | ORAL | 0 refills | Status: DC | PRN

## 2019-09-13 MED ORDER — GLIPIZIDE ER 10 MG PO TB24: ORAL_TABLET | ORAL | 3 refills | Status: DC

## 2019-09-13 NOTE — Progress Notes (Signed)
Physical Therapy Treatment Patient Details Name: Laura Bell MRN: [id number] DOB: June 03, 1936 Today's Date: 09/13/2019    History of Present Illness Laura Bell is a 83 y.o. female with medical history significant of history of breast and cervical cancer, hypertension, type 2 diabetes who presents for concerns of a fall.  Patient was going down the stairs today and thinks she might of missed a step and fell Right sided superior and inferior pubic rami factures    PT Comments    Pt is making gradual progress towards goals with improved ease of mobility this date. In recliner upon arrival and agreeable to therapy. Good endurance noted with there-ex, however does fatigue with ambulation distance. Will continue to progress as able.   Follow Up Recommendations  SNF     Equipment Recommendations  Rolling walker with 5" wheels    Recommendations for Other Services       Precautions / Restrictions Precautions Precautions: Fall Restrictions Weight Bearing Restrictions: Yes RLE Weight Bearing: Weight bearing as tolerated    Mobility  Bed Mobility               General bed mobility comments: not performed as pt received in recliner  Transfers Overall transfer level: Needs assistance Equipment used: Rolling walker (2 wheeled) Transfers: Sit to/from Stand Sit to Stand: Min guard         General transfer comment: Safe technique with able to push from seated surface. Once standing, able to stand with upright posture  Ambulation/Gait Ambulation/Gait assistance: Min guard Gait Distance (Feet): 20 Feet Assistive device: Rolling walker (2 wheeled) Gait Pattern/deviations: Step-through pattern     General Gait Details: improved gait pattern, able to progress to reciprocal gait pattern. Still ambulates with slow gait speed and cautious steps. Fatigues quickly and self limits distance.   Stairs             Wheelchair Mobility    Modified Rankin (Stroke  Patients Only)       Balance Overall balance assessment: Needs assistance Sitting-balance support: No upper extremity supported;Feet supported Sitting balance-Leahy Scale: Good     Standing balance support: Bilateral upper extremity supported Standing balance-Leahy Scale: Good                              Cognition Arousal/Alertness: Awake/alert Behavior During Therapy: WFL for tasks assessed/performed Overall Cognitive Status: Within Functional Limits for tasks assessed                                        Exercises Other Exercises Other Exercises: Seated ther-ex performed including B LE AP, quad sets, SLRs, hip abd/add, SAQ, and LAQ. All ther-ex performed x 15 reps with min assist on R LE. Other Exercises: ambulated to [facility] with ability to transfer safely on/off. Needs min assist for standing and performing hygiene.    General Comments        Pertinent Vitals/Pain Pain Assessment: Faces Faces Pain Scale: Hurts little more Pain Location: R hip - with gait Pain Descriptors / Indicators: Aching;Sore Pain Intervention(s): Limited activity within patient's tolerance    Home Living                      Prior Function            PT Goals (current goals can now  be found in the care plan section) Acute Rehab PT Goals Patient Stated Goal: to walk PT Goal Formulation: With patient Time For Goal Achievement: 09/19/19 Potential to Achieve Goals: Good Progress towards PT goals: Progressing toward goals    Frequency    7X/week      PT Plan Current plan remains appropriate    Co-evaluation              AM-PAC PT "6 Clicks" Mobility   Outcome Measure  Help needed turning from your back to your side while in a flat bed without using bedrails?: A Little Help needed moving from lying on your back to sitting on the side of a flat bed without using bedrails?: A Little Help needed moving to and from a bed to a chair (including  a wheelchair)?: A Little Help needed standing up from a chair using your arms (e.g., wheelchair or bedside chair)?: A Little Help needed to walk in hospital room?: A Little Help needed climbing 3-5 steps with a railing? : A Lot 6 Click Score: 17    End of Session Equipment Utilized During Treatment: Gait belt Activity Tolerance: Patient tolerated treatment well;Patient limited by fatigue;Patient limited by pain Patient left: in chair;with chair alarm set;with call bell/phone within reach;with nursing/sitter in room Nurse Communication: Mobility status PT Visit Diagnosis: Unsteadiness on feet (R26.81);Repeated falls (R29.6);Muscle weakness (generalized) (M62.81);Difficulty in walking, not elsewhere classified (R26.2)     Time: [id number] PT Time Calculation (min) (ACUTE ONLY): 23 min  Charges:  $Gait Training: 8-22 mins $Therapeutic Exercise: 8-22 mins                     Laura Bell, PT, DPT [phone]    Laura Bell 09/13/2019, 1:01 PM

## 2019-09-13 NOTE — TOC Transition Note (Signed)
Transition of Care [facility]) - CM/SW Discharge Note   Patient Details  Name: Laura Bell MRN: [id number] Date of Birth: 13-May-1936  Transition of Care [facility]) CM/SW Contact:  Beverly Sessions, RN Phone Number: 09/13/2019, 5:34 PM   Clinical Narrative:    Patient to discharge today to [facility] Discharge information sent in the [facility]  EMS packet on chart, bedside RN notified.  Bedside RN to notify family  Final next level of care: Skilled Nursing Facility Barriers to Discharge: Barriers Resolved   Patient Goals and CMS Choice   CMS Medicare.gov Compare Post Acute Care list provided to:: Patient Choice offered to / list presented to : Patient  Discharge Placement              Patient chooses bed at: [facility] Patient to be transferred to facility by: EMS Name of family member notified: notified by bedside RN Patient and family notified of of transfer: 09/13/19  Discharge Plan and Services In-house Referral: Clinical Social Work   Post Acute Care Choice: [facility]                               Social Determinants of Health (SDOH) Interventions     Readmission Risk Interventions Readmission Risk Prevention Plan 09/06/2019  Transportation Screening Complete  Medication Review (RN CM) Complete  Some recent data might be hidden

## 2019-09-13 NOTE — Care Management Important Message (Signed)
Important Message  Patient Details  Name: Laura Bell MRN: [id number] Date of Birth: [birth year]   Medicare Important Message Given:  No  Patient discharged prior to arrival to unit to deliver concurrent Medicare IM.    Dannette Barbara 09/13/2019, 3:40 PM

## 2019-09-13 NOTE — Discharge Summary (Addendum)
Physician Discharge Summary  MAKENZIE WEISNER MHD:[id number] DOB: [birth year] DOA: 09/04/2019  PCP: Abner Greenspan, MD  Admit date: 09/04/2019 Discharge date: 09/13/2019  Admitted From: home Disposition:  SNF  Recommendations for Outpatient Follow-up:  Follow up with PCP in 1-2 weeks Please obtain BMP/CBC in one week Will need follow-up with Dr. Sabra Heck, orthopedics by 12/21 will also need repeat x-rays of pelvis. Weight bearing as tolerated  Discharge Condition: Stable CODE STATUS: Full code Diet recommendation: Heart healthy, carb modified  Brief/Interim Summary: 83 year old female with history of hypertension, diabetes who presents to the hospital after mechanical fall.  She suffered superior and inferior pubic rami fracture.  She was also noted to have a significant right hip/thigh hematoma.  She was admitted to the treatments.  Discharge Diagnoses:  Principal Problem:   Pubic ramus fracture (HCC) Active Problems:   Diabetes type 2, uncontrolled ([place])   Hyperlipidemia associated with type 2 diabetes mellitus ([place])   Essential hypertension   GERD   Hematoma of right thigh  Right-sided superior and inferior pubic rami fracture with right hip/thigh hematoma.  Status post mechanical fall by orthopedic with recommendations for weightbearing as tolerated, serial CBC.  Hemoglobin has been stable.  Pain is reasonably controlled.  Seen by physical therapy with recommendation for skilled nursing facility placement. Urinary tract infection.  Patient had a significant leukocytosis.  Urinalysis indicated moderate leukocytes and many WBCs.  She was treated with IV Rocephin and transitioned to oral Keflex.  She has completed her course of antibiotics in the hospital. Hypertension.  Blood pressure currently stable on lisinopril and metoprolol. Diabetes, uncontrolled with hyperglycemia.  Continue on glipizide XL and Metformin.  She was started on Lantus for better blood sugar  control. Hyperlipidemia.  Continue statin.  Discharge Instructions  Discharge Instructions     Diet - low sodium heart healthy   Complete by: As directed    Increase activity slowly   Complete by: As directed       Allergies as of 09/13/2019       Reactions   Actos [pioglitazone]    Fatigue/pedal edema/exercise intol and palpitations   Alendronate Sodium    GI side eff   Boniva [ibandronate Sodium]    GI side eff   Ibandronic Acid Hives   GI side eff   Lansoprazole    Omeprazole Itching   ? Itching         Medication List     STOP taking these medications    aspirin 81 MG tablet Replaced by: aspirin EC 325 MG tablet   cephALEXin 500 MG capsule Commonly known as: KEFLEX       TAKE these medications    Accu-Chek Softclix Lancets lancets USE 1  TO CHECK GLUCOSE TWICE DAILY AND  AS  NEEDED   aspirin EC 325 MG tablet Take 1 tablet (325 mg total) by mouth 2 (two) times daily. For 6 weeks Replaces: aspirin 81 MG tablet   citalopram 20 MG tablet Commonly known as: CeleXA Take 1 tablet (20 mg total) by mouth daily.   clotrimazole-betamethasone cream Commonly known as: Lotrisone Apply 1 application topically 2 (two) times daily.   conjugated estrogens vaginal cream Commonly known as: Premarin Apply 0.90m (pea-sized amount)  just inside the vaginal introitus with a finger-tip on  Monday, Wednesday and Friday nights.   cyanocobalamin 1000 MCG tablet Take 1,000 mcg by mouth daily.   famotidine 40 MG tablet Commonly known as: Pepcid Take 1 tablet (40 mg total) by mouth  daily. For acid reflux   glipiZIDE 10 MG 24 hr tablet Commonly known as: glipiZIDE XL TAKE 1 TABLET BY MOUTH DAILY AFTER A MEAL What changed: additional instructions   HYDROcodone-acetaminophen 5-325 MG tablet Commonly known as: NORCO/VICODIN Take 1-2 tablets by mouth every 4 (four) hours as needed for moderate pain.   insulin glargine 100 UNIT/ML injection Commonly known as:  LANTUS Inject 0.1 mLs (10 Units total) into the skin daily. Start taking on: September 14, 2019   lisinopril 40 MG tablet Commonly known as: ZESTRIL Take 1 tablet (40 mg total) by mouth daily.   metFORMIN 1000 MG tablet Commonly known as: GLUCOPHAGE TAKE 1 TABLET BY MOUTH TWICE DAILY WITH A MEAL What changed:  how much to take how to take this when to take this   metoprolol succinate 50 MG 24 hr tablet Commonly known as: TOPROL-XL Take 1 tablet (50 mg total) by mouth daily. Take with or immediately following a meal.   nystatin-triamcinolone ointment Commonly known as: MYCOLOG Apply 1 application 2 (two) times daily topically.   OneTouch Verio test strip Generic drug: glucose blood USE 1 STRIP TO CHECK GLUCOSE TWICE DAILY (DX.  E11.65)   OneTouch Verio w/Device Kit 1 Device by Other route 2 (two) times daily. **ONETOUCH VERIO** Use to check blood sugar twice daily for DM (dx. E11.65)   polyethylene glycol 17 g packet Commonly known as: MIRALAX / GLYCOLAX Take 17 g by mouth daily as needed for mild constipation.   simvastatin 20 MG tablet Commonly known as: ZOCOR TAKE ONE TABLET BY MOUTH IN THE EVENING WITH  A  LOW  FAT  SNACK   TYLENOL 500 MG tablet Generic drug: acetaminophen Take 500 mg by mouth every 6 (six) hours as needed.   Vitamin D 50 MCG (2000 UT) Caps Take 2,000 Units by mouth daily.        Contact information for follow-up providers     Earnestine Leys, MD. Schedule an appointment as soon as possible for a visit in 2 week(s).   Specialty: Orthopedic Surgery Contact information: [place] [address] [phone]              Contact information for after-discharge care     Destination     [facility] .   Service: Skilled Nursing Contact information: [address] [place] [phone]                    Allergies  Allergen Reactions   Actos  [Pioglitazone]     Fatigue/pedal edema/exercise intol and palpitations   Alendronate Sodium     GI side eff   Boniva [Ibandronate Sodium]     GI side eff   Ibandronic Acid Hives    GI side eff   Lansoprazole    Omeprazole Itching    ? Itching     Consultations: orthopedics   Procedures/Studies: DG Pelvis 1-2 Views  Result Date: 09/04/2019 CLINICAL DATA:  Status post fall. Pain in right lower groin area. EXAM: PELVIS - 1-2 VIEW COMPARISON:  CT AP 03/01/2019 FINDINGS: Acute comminuted fracture deformities involve the right superior and inferior pubic rami. There is moderate to advanced osteoarthritis involving the right hip. Mild degenerative changes are noted involving the left hip. IMPRESSION: 1. Acute comminuted fracture deformities involve the right superior and inferior pubic rami. 2. Moderate to advanced right hip osteoarthritis. 3. Mild left hip osteoarthritis. Electronically Signed   By: Lovena Le  Clovis Riley M.D.   On: 09/04/2019 16:02   CT Head Wo Contrast  Result Date: 09/04/2019 CLINICAL DATA:  Mechanical fall, head trauma EXAM: CT HEAD WITHOUT CONTRAST CT CERVICAL SPINE WITHOUT CONTRAST TECHNIQUE: Multidetector CT imaging of the head and cervical spine was performed following the standard protocol without intravenous contrast. Multiplanar CT image reconstructions of the cervical spine were also generated. COMPARISON:  CT cervical spine 10/04/2007, CT head 05/31/2019 FINDINGS: CT HEAD FINDINGS Brain: Generalized atrophy. Normal ventricular morphology. No midline shift or mass effect. Small vessel chronic ischemic changes of deep cerebral white matter. No intracranial hemorrhage, mass lesion, evidence of acute infarction, or extra-axial fluid collection. Vascular: Atherosclerotic calcifications of internal carotid arteries bilaterally at skull base Skull: Demineralized but intact Sinuses/Orbits: Clear Other: N/A CT CERVICAL SPINE FINDINGS Alignment: Minimal retrolisthesis at C5-C6 little  changed. Minimal anterolisthesis C4-C5, little changed. Remaining alignments normal. Skull base and vertebrae: Osseous demineralization. Skull base intact. Scattered multilevel facet degenerative changes. Vertebral body heights maintained without fracture or bone destruction. Disc space narrowing C4-C5 and C5-C6. Degenerative changes at anterior C1-C2. Soft tissues and spinal canal: Prevertebral soft tissues normal thickness. Atherosclerotic calcifications at the carotid bifurcations bilaterally, at proximal great vessels, and cranial aspect of aortic arch. 12 mm LEFT thyroid nodule, unchanged. Disc levels:  No specific abnormalities Upper chest: Lung apices clear Other: N/A IMPRESSION: Atrophy with small vessel chronic ischemic changes of deep cerebral white matter. No acute intracranial abnormalities. Osseous demineralization with degenerative disc and facet disease changes of the cervical spine. No acute cervical spine abnormalities. 12 mm nonspecific LEFT thyroid nodule unchanged since 2009; no follow-up recommended.(Ref: J Am Coll Radiol. 2015 Feb;12(2): 143-50). Electronically Signed   By: Lavonia Dana M.D.   On: 09/04/2019 17:54   CT Cervical Spine Wo Contrast  Result Date: 09/04/2019 CLINICAL DATA:  Mechanical fall, head trauma EXAM: CT HEAD WITHOUT CONTRAST CT CERVICAL SPINE WITHOUT CONTRAST TECHNIQUE: Multidetector CT imaging of the head and cervical spine was performed following the standard protocol without intravenous contrast. Multiplanar CT image reconstructions of the cervical spine were also generated. COMPARISON:  CT cervical spine 10/04/2007, CT head 05/31/2019 FINDINGS: CT HEAD FINDINGS Brain: Generalized atrophy. Normal ventricular morphology. No midline shift or mass effect. Small vessel chronic ischemic changes of deep cerebral white matter. No intracranial hemorrhage, mass lesion, evidence of acute infarction, or extra-axial fluid collection. Vascular: Atherosclerotic calcifications of  internal carotid arteries bilaterally at skull base Skull: Demineralized but intact Sinuses/Orbits: Clear Other: N/A CT CERVICAL SPINE FINDINGS Alignment: Minimal retrolisthesis at C5-C6 little changed. Minimal anterolisthesis C4-C5, little changed. Remaining alignments normal. Skull base and vertebrae: Osseous demineralization. Skull base intact. Scattered multilevel facet degenerative changes. Vertebral body heights maintained without fracture or bone destruction. Disc space narrowing C4-C5 and C5-C6. Degenerative changes at anterior C1-C2. Soft tissues and spinal canal: Prevertebral soft tissues normal thickness. Atherosclerotic calcifications at the carotid bifurcations bilaterally, at proximal great vessels, and cranial aspect of aortic arch. 12 mm LEFT thyroid nodule, unchanged. Disc levels:  No specific abnormalities Upper chest: Lung apices clear Other: N/A IMPRESSION: Atrophy with small vessel chronic ischemic changes of deep cerebral white matter. No acute intracranial abnormalities. Osseous demineralization with degenerative disc and facet disease changes of the cervical spine. No acute cervical spine abnormalities. 12 mm nonspecific LEFT thyroid nodule unchanged since 2009; no follow-up recommended.(Ref: J Am Coll Radiol. 2015 Feb;12(2): 143-50). Electronically Signed   By: Lavonia Dana M.D.   On: 09/04/2019 17:54   CT PELVIS WO CONTRAST  Result  Date: 09/04/2019 CLINICAL DATA:  [place]. Right hip and leg pain. EXAM: CT OF THE LOWER RIGHT EXTREMITY WITHOUT CONTRAST, CT PELVIS WITHOUT CONTRAST TECHNIQUE: Multidetector CT imaging of the pelvis and right lower extremity was performed according to the standard protocol. COMPARISON:  Radiographs, same date. FINDINGS: Both hips are normally located. There are advanced right hip joint degenerative changes and moderate left hip joint degenerative changes. Joint space narrowing, osteophytic spurring and subchondral cystic change right much greater than left. No  acute hip fracture or evidence of AVN. As demonstrated on the radiographs there are right-sided superior and inferior pubic rami fractures. No other pelvic fractures are identified. Moderate degenerative changes at the pubic symphysis. The SI joints are intact. There is a moderate-sized extraperitoneal pelvic hematoma on the right side with mild mass effect on the right side of the bladder. No obvious intramuscular hematomas involving the right abductor muscles. The right femur is intact. No femur fracture. There is a large diffuse subcutaneous hematoma involving the right lateral upper thigh. No underlying intramuscular hematoma. The right knee joint is intact. Moderate degenerative changes. No joint effusion. Moderate to advanced atherosclerotic calcifications involving the aorta iliac arteries. A pessary ring is noted in the vagina. IMPRESSION: 1. Right-sided superior and inferior pubic rami fractures. 2. No other pelvic fractures are identified. 3. Moderate-sized extraperitoneal pelvic hematoma on the right side with mild mass effect on the right side of the bladder. 4. Large diffuse subcutaneous hematoma involving the right lateral upper thigh. 5. Advanced right hip joint degenerative changes and moderate left hip joint degenerative changes. 6. No femur fracture. Electronically Signed   By: Marijo Sanes M.D.   On: 09/04/2019 17:44   CT FEMUR RIGHT WO CONTRAST  Result Date: 09/04/2019 CLINICAL DATA:  [place]. Right hip and leg pain. EXAM: CT OF THE LOWER RIGHT EXTREMITY WITHOUT CONTRAST, CT PELVIS WITHOUT CONTRAST TECHNIQUE: Multidetector CT imaging of the pelvis and right lower extremity was performed according to the standard protocol. COMPARISON:  Radiographs, same date. FINDINGS: Both hips are normally located. There are advanced right hip joint degenerative changes and moderate left hip joint degenerative changes. Joint space narrowing, osteophytic spurring and subchondral cystic change right much greater  than left. No acute hip fracture or evidence of AVN. As demonstrated on the radiographs there are right-sided superior and inferior pubic rami fractures. No other pelvic fractures are identified. Moderate degenerative changes at the pubic symphysis. The SI joints are intact. There is a moderate-sized extraperitoneal pelvic hematoma on the right side with mild mass effect on the right side of the bladder. No obvious intramuscular hematomas involving the right abductor muscles. The right femur is intact. No femur fracture. There is a large diffuse subcutaneous hematoma involving the right lateral upper thigh. No underlying intramuscular hematoma. The right knee joint is intact. Moderate degenerative changes. No joint effusion. Moderate to advanced atherosclerotic calcifications involving the aorta iliac arteries. A pessary ring is noted in the vagina. IMPRESSION: 1. Right-sided superior and inferior pubic rami fractures. 2. No other pelvic fractures are identified. 3. Moderate-sized extraperitoneal pelvic hematoma on the right side with mild mass effect on the right side of the bladder. 4. Large diffuse subcutaneous hematoma involving the right lateral upper thigh. 5. Advanced right hip joint degenerative changes and moderate left hip joint degenerative changes. 6. No femur fracture. Electronically Signed   By: Marijo Sanes M.D.   On: 09/04/2019 17:44      Subjective: No complaints.  Pain is reasonably controlled.  No  shortness of breath.  Discharge Exam: Vitals:   09/12/19 0758 09/12/19 1550 09/12/19 2146 09/13/19 0612  BP: 135/65 (!) 118/50 (!) 142/65 (!) 149/81  Pulse: 88 89 91 (!) 101  Resp: _0 Temp: 98.4 F (36.9 C) 98.6 F (37 C) 98.1 F (36.7 C) 98.4 F (36.9 C)  TempSrc: Oral Oral Oral Oral  SpO2: 95% 96% 96% 98%  Weight:      Height:        General: Pt is alert, awake, not in acute distress Cardiovascular: RRR, S1/S2 +, no rubs, no gallops Respiratory: CTA bilaterally, no  wheezing, no rhonchi Abdominal: Soft, NT, ND, bowel sounds + Extremities: no edema, no cyanosis    The results of significant diagnostics from this hospitalization (including imaging, microbiology, ancillary and laboratory) are listed below for reference.     Microbiology: Recent Results (from the past 240 hour(s))  Urine culture     Status: Abnormal   Collection Time: 09/04/19  8:00 PM   Specimen: Urine, Clean Catch  Result Value Ref Range Status   Specimen Description   Final    URINE, CLEAN CATCH Performed at [facility], [address] [place], [place] [address]    Special Requests   Final    NONE Performed at [facility], [place] [address] [place], [place] [address]    Culture MULTIPLE SPECIES PRESENT, SUGGEST RECOLLECTION (A)  Final   Report Status 09/07/2019 FINAL  Final  SARS CORONAVIRUS 2 (TAT 6-24 HRS) Nasopharyngeal Nasopharyngeal Swab     Status: None   Collection Time: 09/04/19  9:25 PM   Specimen: Nasopharyngeal Swab  Result Value Ref Range Status   SARS Coronavirus 2 NEGATIVE NEGATIVE Final    Comment: (NOTE) SARS-CoV-2 target nucleic acids are NOT DETECTED. The SARS-CoV-2 RNA is generally detectable in upper and lower respiratory specimens during the acute phase of infection. Negative results do not preclude SARS-CoV-2 infection, do not rule out co-infections with other pathogens, and should not be used as the sole basis for treatment or other patient management decisions. Negative results must be combined with clinical observations, patient history, and epidemiological information. The expected result is Negative. Fact Sheet for Patients: SugarRoll.be Fact Sheet for Healthcare Providers: https://www.woods-mathews.com/ This test is not yet approved or cleared by the [place] FDA and  has been authorized for detection and/or diagnosis of SARS-CoV-2 by FDA under an Emergency Use Authorization  (EUA). This EUA will remain  in effect (meaning this test can be used) for the duration of the COVID-19 declaration under Section 56 4(b)(1) of the Act, 21 U.S.C. section 360bbb-3(b)(1), unless the authorization is terminated or revoked sooner. Performed at [facility], [place] [address] [place], [place] [address]   SARS CORONAVIRUS 2 (TAT 6-24 HRS) Nasopharyngeal Nasopharyngeal Swab     Status: None   Collection Time: 09/07/19 10:44 AM   Specimen: Nasopharyngeal Swab  Result Value Ref Range Status   SARS Coronavirus 2 NEGATIVE NEGATIVE Final    Comment: (NOTE) SARS-CoV-2 target nucleic acids are NOT DETECTED. The SARS-CoV-2 RNA is generally detectable in upper and lower respiratory specimens during the acute phase of infection. Negative results do not preclude SARS-CoV-2 infection, do not rule out co-infections with other pathogens, and should not be used as the sole basis for treatment or other patient management decisions. Negative results must be combined with clinical observations, patient history, and epidemiological information. The expected result is Negative. Fact Sheet for Patients: SugarRoll.be Fact  Sheet for Healthcare Providers: https://www.woods-mathews.com/ This test is not yet approved or cleared by the [place] FDA and  has been authorized for detection and/or diagnosis of SARS-CoV-2 by FDA under an Emergency Use Authorization (EUA). This EUA will remain  in effect (meaning this test can be used) for the duration of the COVID-19 declaration under Section 56 4(b)(1) of the Act, 21 U.S.C. section 360bbb-3(b)(1), unless the authorization is terminated or revoked sooner. Performed at [facility], [place] [address] [place], [place] [address]      Labs: BNP (last 3 results) No results for input(s): BNP in the last 8760 hours. Basic Metabolic Panel: No results for input(s): NA, K, CL, CO2, GLUCOSE, BUN,  CREATININE, CALCIUM, MG, PHOS in the last 168 hours. Liver Function Tests: No results for input(s): AST, ALT, ALKPHOS, BILITOT, PROT, ALBUMIN in the last 168 hours. No results for input(s): LIPASE, AMYLASE in the last 168 hours. No results for input(s): AMMONIA in the last 168 hours. CBC: Recent Labs  Lab 09/09/19 0409  WBC 8.5  HGB 9.4*  HCT 29.0*  MCV 83.6  PLT 292   Cardiac Enzymes: No results for input(s): CKTOTAL, CKMB, CKMBINDEX, TROPONINI in the last 168 hours. BNP: Invalid input(s): POCBNP CBG: Recent Labs  Lab 09/12/19 0755 09/12/19 1123 09/12/19 1615 09/12/19 2149 09/13/19 0733  GLUCAP 143* 220* 125* 136* 133*   D-Dimer No results for input(s): DDIMER in the last 72 hours. Hgb A1c No results for input(s): HGBA1C in the last 72 hours. Lipid Profile No results for input(s): CHOL, HDL, LDLCALC, TRIG, CHOLHDL, LDLDIRECT in the last 72 hours. Thyroid function studies No results for input(s): TSH, T4TOTAL, T3FREE, THYROIDAB in the last 72 hours.  Invalid input(s): FREET3 Anemia work up No results for input(s): VITAMINB12, FOLATE, FERRITIN, TIBC, IRON, RETICCTPCT in the last 72 hours. Urinalysis    Component Value Date/Time   COLORURINE YELLOW (A) 09/04/2019 1653   APPEARANCEUR CLOUDY (A) 09/04/2019 1653   APPEARANCEUR Cloudy (A) 09/03/2018 1537   LABSPEC 1.023 09/04/2019 1653   PHURINE 5.0 09/04/2019 1653   GLUCOSEU >=500 (A) 09/04/2019 1653   HGBUR SMALL (A) 09/04/2019 1653   BILIRUBINUR NEGATIVE 09/04/2019 1653   BILIRUBINUR Negative 07/30/2019 1540   BILIRUBINUR Negative 09/03/2018 1537   KETONESUR 5 (A) 09/04/2019 1653   PROTEINUR 100 (A) 09/04/2019 1653   UROBILINOGEN 0.2 07/30/2019 1540   NITRITE NEGATIVE 09/04/2019 1653   LEUKOCYTESUR MODERATE (A) 09/04/2019 1653   Sepsis Labs Invalid input(s): PROCALCITONIN,  WBC,  LACTICIDVEN Microbiology Recent Results (from the past 240 hour(s))  Urine culture     Status: Abnormal   Collection Time:  09/04/19  8:00 PM   Specimen: Urine, Clean Catch  Result Value Ref Range Status   Specimen Description   Final    URINE, CLEAN CATCH Performed at [facility], [address], [place], [place] [address]    Special Requests   Final    NONE Performed at [facility], [place] [address], [place], [place] [address]    Culture MULTIPLE SPECIES PRESENT, SUGGEST RECOLLECTION (A)  Final   Report Status 09/07/2019 FINAL  Final  SARS CORONAVIRUS 2 (TAT 6-24 HRS) Nasopharyngeal Nasopharyngeal Swab     Status: None   Collection Time: 09/04/19  9:25 PM   Specimen: Nasopharyngeal Swab  Result Value Ref Range Status   SARS Coronavirus 2 NEGATIVE NEGATIVE Final    Comment: (NOTE) SARS-CoV-2 target nucleic acids are NOT DETECTED. The SARS-CoV-2 RNA is generally  detectable in upper and lower respiratory specimens during the acute phase of infection. Negative results do not preclude SARS-CoV-2 infection, do not rule out co-infections with other pathogens, and should not be used as the sole basis for treatment or other patient management decisions. Negative results must be combined with clinical observations, patient history, and epidemiological information. The expected result is Negative. Fact Sheet for Patients: SugarRoll.be Fact Sheet for Healthcare Providers: https://www.woods-mathews.com/ This test is not yet approved or cleared by the [place] FDA and  has been authorized for detection and/or diagnosis of SARS-CoV-2 by FDA under an Emergency Use Authorization (EUA). This EUA will remain  in effect (meaning this test can be used) for the duration of the COVID-19 declaration under Section 56 4(b)(1) of the Act, 21 U.S.C. section 360bbb-3(b)(1), unless the authorization is terminated or revoked sooner. Performed at [facility], [place] [address], [place], [place] [address]   SARS CORONAVIRUS 2 (TAT 6-24 HRS) Nasopharyngeal  Nasopharyngeal Swab     Status: None   Collection Time: 09/07/19 10:44 AM   Specimen: Nasopharyngeal Swab  Result Value Ref Range Status   SARS Coronavirus 2 NEGATIVE NEGATIVE Final    Comment: (NOTE) SARS-CoV-2 target nucleic acids are NOT DETECTED. The SARS-CoV-2 RNA is generally detectable in upper and lower respiratory specimens during the acute phase of infection. Negative results do not preclude SARS-CoV-2 infection, do not rule out co-infections with other pathogens, and should not be used as the sole basis for treatment or other patient management decisions. Negative results must be combined with clinical observations, patient history, and epidemiological information. The expected result is Negative. Fact Sheet for Patients: SugarRoll.be Fact Sheet for Healthcare Providers: https://www.woods-mathews.com/ This test is not yet approved or cleared by the [place] FDA and  has been authorized for detection and/or diagnosis of SARS-CoV-2 by FDA under an Emergency Use Authorization (EUA). This EUA will remain  in effect (meaning this test can be used) for the duration of the COVID-19 declaration under Section 56 4(b)(1) of the Act, 21 U.S.C. section 360bbb-3(b)(1), unless the authorization is terminated or revoked sooner. Performed at [facility], [place] [address], [place], [place] [address]      Time coordinating discharge: 78mns  SIGNED:   JKathie Dike MD  [facility] 09/13/2019, 11:18 AM   If 7PM-7AM, please contact night-coverage [email or web address]

## 2019-09-13 NOTE — Progress Notes (Signed)
Report given to [facility] and pt discharged via EMS to [facility]. RN left message with daughter that pt had left.

## 2019-09-14 DIAGNOSIS — D518 Other vitamin B12 deficiency anemias: Secondary | ICD-10-CM | POA: Diagnosis not present

## 2019-09-14 DIAGNOSIS — E1165 Type 2 diabetes mellitus with hyperglycemia: Secondary | ICD-10-CM | POA: Diagnosis not present

## 2019-09-14 DIAGNOSIS — K5909 Other constipation: Secondary | ICD-10-CM | POA: Diagnosis not present

## 2019-09-14 DIAGNOSIS — E7849 Other hyperlipidemia: Secondary | ICD-10-CM | POA: Diagnosis not present

## 2019-09-14 DIAGNOSIS — I10 Essential (primary) hypertension: Secondary | ICD-10-CM | POA: Diagnosis not present

## 2019-09-14 DIAGNOSIS — R69 Illness, unspecified: Secondary | ICD-10-CM | POA: Diagnosis not present

## 2019-09-14 DIAGNOSIS — S32511D Fracture of superior rim of right pubis, subsequent encounter for fracture with routine healing: Secondary | ICD-10-CM | POA: Diagnosis not present

## 2019-09-14 DIAGNOSIS — K219 Gastro-esophageal reflux disease without esophagitis: Secondary | ICD-10-CM | POA: Diagnosis not present

## 2019-09-14 DIAGNOSIS — S7001XD Contusion of right hip, subsequent encounter: Secondary | ICD-10-CM | POA: Diagnosis not present

## 2019-09-14 DIAGNOSIS — Z794 Long term (current) use of insulin: Secondary | ICD-10-CM | POA: Diagnosis not present

## 2019-09-20 DIAGNOSIS — S32810A Multiple fractures of pelvis with stable disruption of pelvic ring, initial encounter for closed fracture: Secondary | ICD-10-CM | POA: Diagnosis not present

## 2019-09-29 DIAGNOSIS — S32511D Fracture of superior rim of right pubis, subsequent encounter for fracture with routine healing: Secondary | ICD-10-CM | POA: Diagnosis not present

## 2019-09-29 DIAGNOSIS — E1165 Type 2 diabetes mellitus with hyperglycemia: Secondary | ICD-10-CM | POA: Diagnosis not present

## 2019-09-29 DIAGNOSIS — R69 Illness, unspecified: Secondary | ICD-10-CM | POA: Diagnosis not present

## 2019-09-29 DIAGNOSIS — N905 Atrophy of vulva: Secondary | ICD-10-CM | POA: Diagnosis not present

## 2019-09-30 ENCOUNTER — Telehealth: Payer: Self-pay | Admitting: Family Medicine

## 2019-09-30 NOTE — Telephone Encounter (Signed)
[place] is calling to schedule a appt a hospital follow up apt. Patient will be discharging from facilty and returning home with home health. Please advise with -the patient. CB- [phone]

## 2019-10-04 NOTE — Telephone Encounter (Signed)
Patient scheduled appointment on 10/22/19.

## 2019-10-06 DIAGNOSIS — N905 Atrophy of vulva: Secondary | ICD-10-CM | POA: Diagnosis not present

## 2019-10-06 DIAGNOSIS — S32591D Other specified fracture of right pubis, subsequent encounter for fracture with routine healing: Secondary | ICD-10-CM | POA: Diagnosis not present

## 2019-10-06 DIAGNOSIS — E119 Type 2 diabetes mellitus without complications: Secondary | ICD-10-CM | POA: Diagnosis not present

## 2019-10-06 DIAGNOSIS — S32511D Fracture of superior rim of right pubis, subsequent encounter for fracture with routine healing: Secondary | ICD-10-CM | POA: Diagnosis not present

## 2019-10-06 DIAGNOSIS — D519 Vitamin B12 deficiency anemia, unspecified: Secondary | ICD-10-CM | POA: Diagnosis not present

## 2019-10-06 DIAGNOSIS — I10 Essential (primary) hypertension: Secondary | ICD-10-CM | POA: Diagnosis not present

## 2019-10-06 DIAGNOSIS — E785 Hyperlipidemia, unspecified: Secondary | ICD-10-CM | POA: Diagnosis not present

## 2019-10-06 DIAGNOSIS — R69 Illness, unspecified: Secondary | ICD-10-CM | POA: Diagnosis not present

## 2019-10-06 DIAGNOSIS — R32 Unspecified urinary incontinence: Secondary | ICD-10-CM | POA: Diagnosis not present

## 2019-10-06 DIAGNOSIS — S7001XD Contusion of right hip, subsequent encounter: Secondary | ICD-10-CM | POA: Diagnosis not present

## 2019-10-09 DIAGNOSIS — S32591D Other specified fracture of right pubis, subsequent encounter for fracture with routine healing: Secondary | ICD-10-CM | POA: Diagnosis not present

## 2019-10-09 DIAGNOSIS — E119 Type 2 diabetes mellitus without complications: Secondary | ICD-10-CM | POA: Diagnosis not present

## 2019-10-09 DIAGNOSIS — I10 Essential (primary) hypertension: Secondary | ICD-10-CM | POA: Diagnosis not present

## 2019-10-09 DIAGNOSIS — R69 Illness, unspecified: Secondary | ICD-10-CM | POA: Diagnosis not present

## 2019-10-09 DIAGNOSIS — R32 Unspecified urinary incontinence: Secondary | ICD-10-CM | POA: Diagnosis not present

## 2019-10-09 DIAGNOSIS — S32511D Fracture of superior rim of right pubis, subsequent encounter for fracture with routine healing: Secondary | ICD-10-CM | POA: Diagnosis not present

## 2019-10-09 DIAGNOSIS — D519 Vitamin B12 deficiency anemia, unspecified: Secondary | ICD-10-CM | POA: Diagnosis not present

## 2019-10-09 DIAGNOSIS — E785 Hyperlipidemia, unspecified: Secondary | ICD-10-CM | POA: Diagnosis not present

## 2019-10-09 DIAGNOSIS — N905 Atrophy of vulva: Secondary | ICD-10-CM | POA: Diagnosis not present

## 2019-10-09 DIAGNOSIS — S7001XD Contusion of right hip, subsequent encounter: Secondary | ICD-10-CM | POA: Diagnosis not present

## 2019-10-11 ENCOUNTER — Telehealth: Payer: Self-pay | Admitting: Family Medicine

## 2019-10-11 DIAGNOSIS — R69 Illness, unspecified: Secondary | ICD-10-CM | POA: Diagnosis not present

## 2019-10-11 DIAGNOSIS — I10 Essential (primary) hypertension: Secondary | ICD-10-CM

## 2019-10-11 DIAGNOSIS — R32 Unspecified urinary incontinence: Secondary | ICD-10-CM

## 2019-10-11 DIAGNOSIS — Z9181 History of falling: Secondary | ICD-10-CM

## 2019-10-11 DIAGNOSIS — Z87440 Personal history of urinary (tract) infections: Secondary | ICD-10-CM

## 2019-10-11 DIAGNOSIS — S32591D Other specified fracture of right pubis, subsequent encounter for fracture with routine healing: Secondary | ICD-10-CM | POA: Diagnosis not present

## 2019-10-11 DIAGNOSIS — E119 Type 2 diabetes mellitus without complications: Secondary | ICD-10-CM | POA: Diagnosis not present

## 2019-10-11 DIAGNOSIS — N905 Atrophy of vulva: Secondary | ICD-10-CM

## 2019-10-11 DIAGNOSIS — F329 Major depressive disorder, single episode, unspecified: Secondary | ICD-10-CM

## 2019-10-11 DIAGNOSIS — K5900 Constipation, unspecified: Secondary | ICD-10-CM

## 2019-10-11 DIAGNOSIS — D519 Vitamin B12 deficiency anemia, unspecified: Secondary | ICD-10-CM

## 2019-10-11 DIAGNOSIS — Z7982 Long term (current) use of aspirin: Secondary | ICD-10-CM

## 2019-10-11 DIAGNOSIS — E785 Hyperlipidemia, unspecified: Secondary | ICD-10-CM

## 2019-10-11 DIAGNOSIS — S7001XD Contusion of right hip, subsequent encounter: Secondary | ICD-10-CM | POA: Diagnosis not present

## 2019-10-11 DIAGNOSIS — Z794 Long term (current) use of insulin: Secondary | ICD-10-CM

## 2019-10-11 DIAGNOSIS — K219 Gastro-esophageal reflux disease without esophagitis: Secondary | ICD-10-CM

## 2019-10-11 DIAGNOSIS — S32511D Fracture of superior rim of right pubis, subsequent encounter for fracture with routine healing: Secondary | ICD-10-CM | POA: Diagnosis not present

## 2019-10-11 NOTE — Telephone Encounter (Signed)
Colletta Maryland, occupational therapist with [facility] is requesting verbal orders  Occupational therapy-  2 times a week for 2 weeks 1 times a week for 2 weeks   Colletta Maryland c/b # [phone]

## 2019-10-11 NOTE — Telephone Encounter (Signed)
Left VM giving verbal orders  

## 2019-10-11 NOTE — Telephone Encounter (Signed)
Please ok those verbal orders  

## 2019-10-12 DIAGNOSIS — E119 Type 2 diabetes mellitus without complications: Secondary | ICD-10-CM | POA: Diagnosis not present

## 2019-10-12 DIAGNOSIS — R32 Unspecified urinary incontinence: Secondary | ICD-10-CM | POA: Diagnosis not present

## 2019-10-12 DIAGNOSIS — E785 Hyperlipidemia, unspecified: Secondary | ICD-10-CM | POA: Diagnosis not present

## 2019-10-12 DIAGNOSIS — S32511D Fracture of superior rim of right pubis, subsequent encounter for fracture with routine healing: Secondary | ICD-10-CM | POA: Diagnosis not present

## 2019-10-12 DIAGNOSIS — I10 Essential (primary) hypertension: Secondary | ICD-10-CM | POA: Diagnosis not present

## 2019-10-12 DIAGNOSIS — S32591D Other specified fracture of right pubis, subsequent encounter for fracture with routine healing: Secondary | ICD-10-CM | POA: Diagnosis not present

## 2019-10-12 DIAGNOSIS — R69 Illness, unspecified: Secondary | ICD-10-CM | POA: Diagnosis not present

## 2019-10-12 DIAGNOSIS — D519 Vitamin B12 deficiency anemia, unspecified: Secondary | ICD-10-CM | POA: Diagnosis not present

## 2019-10-12 DIAGNOSIS — S7001XD Contusion of right hip, subsequent encounter: Secondary | ICD-10-CM | POA: Diagnosis not present

## 2019-10-12 DIAGNOSIS — N905 Atrophy of vulva: Secondary | ICD-10-CM | POA: Diagnosis not present

## 2019-10-13 ENCOUNTER — Ambulatory Visit: Payer: Medicare HMO | Admitting: Oncology

## 2019-10-13 DIAGNOSIS — S32511D Fracture of superior rim of right pubis, subsequent encounter for fracture with routine healing: Secondary | ICD-10-CM | POA: Diagnosis not present

## 2019-10-13 DIAGNOSIS — S7001XD Contusion of right hip, subsequent encounter: Secondary | ICD-10-CM | POA: Diagnosis not present

## 2019-10-13 DIAGNOSIS — R69 Illness, unspecified: Secondary | ICD-10-CM | POA: Diagnosis not present

## 2019-10-13 DIAGNOSIS — E785 Hyperlipidemia, unspecified: Secondary | ICD-10-CM | POA: Diagnosis not present

## 2019-10-13 DIAGNOSIS — R32 Unspecified urinary incontinence: Secondary | ICD-10-CM | POA: Diagnosis not present

## 2019-10-13 DIAGNOSIS — S32591D Other specified fracture of right pubis, subsequent encounter for fracture with routine healing: Secondary | ICD-10-CM | POA: Diagnosis not present

## 2019-10-13 DIAGNOSIS — N905 Atrophy of vulva: Secondary | ICD-10-CM | POA: Diagnosis not present

## 2019-10-13 DIAGNOSIS — D519 Vitamin B12 deficiency anemia, unspecified: Secondary | ICD-10-CM | POA: Diagnosis not present

## 2019-10-13 DIAGNOSIS — E119 Type 2 diabetes mellitus without complications: Secondary | ICD-10-CM | POA: Diagnosis not present

## 2019-10-13 DIAGNOSIS — I10 Essential (primary) hypertension: Secondary | ICD-10-CM | POA: Diagnosis not present

## 2019-10-14 DIAGNOSIS — D519 Vitamin B12 deficiency anemia, unspecified: Secondary | ICD-10-CM | POA: Diagnosis not present

## 2019-10-14 DIAGNOSIS — R69 Illness, unspecified: Secondary | ICD-10-CM | POA: Diagnosis not present

## 2019-10-14 DIAGNOSIS — S32511D Fracture of superior rim of right pubis, subsequent encounter for fracture with routine healing: Secondary | ICD-10-CM | POA: Diagnosis not present

## 2019-10-14 DIAGNOSIS — S7001XD Contusion of right hip, subsequent encounter: Secondary | ICD-10-CM | POA: Diagnosis not present

## 2019-10-14 DIAGNOSIS — I10 Essential (primary) hypertension: Secondary | ICD-10-CM | POA: Diagnosis not present

## 2019-10-14 DIAGNOSIS — N905 Atrophy of vulva: Secondary | ICD-10-CM | POA: Diagnosis not present

## 2019-10-14 DIAGNOSIS — S32591D Other specified fracture of right pubis, subsequent encounter for fracture with routine healing: Secondary | ICD-10-CM | POA: Diagnosis not present

## 2019-10-14 DIAGNOSIS — R32 Unspecified urinary incontinence: Secondary | ICD-10-CM | POA: Diagnosis not present

## 2019-10-14 DIAGNOSIS — E785 Hyperlipidemia, unspecified: Secondary | ICD-10-CM | POA: Diagnosis not present

## 2019-10-14 DIAGNOSIS — E119 Type 2 diabetes mellitus without complications: Secondary | ICD-10-CM | POA: Diagnosis not present

## 2019-10-16 DIAGNOSIS — E785 Hyperlipidemia, unspecified: Secondary | ICD-10-CM | POA: Diagnosis not present

## 2019-10-16 DIAGNOSIS — R32 Unspecified urinary incontinence: Secondary | ICD-10-CM | POA: Diagnosis not present

## 2019-10-16 DIAGNOSIS — R69 Illness, unspecified: Secondary | ICD-10-CM | POA: Diagnosis not present

## 2019-10-16 DIAGNOSIS — N905 Atrophy of vulva: Secondary | ICD-10-CM | POA: Diagnosis not present

## 2019-10-16 DIAGNOSIS — I10 Essential (primary) hypertension: Secondary | ICD-10-CM | POA: Diagnosis not present

## 2019-10-16 DIAGNOSIS — E119 Type 2 diabetes mellitus without complications: Secondary | ICD-10-CM | POA: Diagnosis not present

## 2019-10-16 DIAGNOSIS — D519 Vitamin B12 deficiency anemia, unspecified: Secondary | ICD-10-CM | POA: Diagnosis not present

## 2019-10-16 DIAGNOSIS — S7001XD Contusion of right hip, subsequent encounter: Secondary | ICD-10-CM | POA: Diagnosis not present

## 2019-10-16 DIAGNOSIS — S32591D Other specified fracture of right pubis, subsequent encounter for fracture with routine healing: Secondary | ICD-10-CM | POA: Diagnosis not present

## 2019-10-16 DIAGNOSIS — S32511D Fracture of superior rim of right pubis, subsequent encounter for fracture with routine healing: Secondary | ICD-10-CM | POA: Diagnosis not present

## 2019-10-19 DIAGNOSIS — S32810A Multiple fractures of pelvis with stable disruption of pelvic ring, initial encounter for closed fracture: Secondary | ICD-10-CM | POA: Diagnosis not present

## 2019-10-19 DIAGNOSIS — R69 Illness, unspecified: Secondary | ICD-10-CM | POA: Diagnosis not present
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
## 2019-10-20 ENCOUNTER — Inpatient Hospital Stay: Payer: Medicare HMO | Attending: Oncology | Admitting: Oncology

## 2019-10-20 DIAGNOSIS — Z170 Estrogen receptor positive status [ER+]: Secondary | ICD-10-CM

## 2019-10-20 DIAGNOSIS — R32 Unspecified urinary incontinence: Secondary | ICD-10-CM | POA: Diagnosis not present

## 2019-10-20 DIAGNOSIS — R69 Illness, unspecified: Secondary | ICD-10-CM | POA: Diagnosis not present

## 2019-10-20 DIAGNOSIS — N905 Atrophy of vulva: Secondary | ICD-10-CM | POA: Diagnosis not present

## 2019-10-20 DIAGNOSIS — D519 Vitamin B12 deficiency anemia, unspecified: Secondary | ICD-10-CM | POA: Diagnosis not present

## 2019-10-20 DIAGNOSIS — C50512 Malignant neoplasm of lower-outer quadrant of left female breast: Secondary | ICD-10-CM | POA: Diagnosis not present

## 2019-10-20 DIAGNOSIS — E119 Type 2 diabetes mellitus without complications: Secondary | ICD-10-CM | POA: Diagnosis not present

## 2019-10-20 DIAGNOSIS — S32511D Fracture of superior rim of right pubis, subsequent encounter for fracture with routine healing: Secondary | ICD-10-CM | POA: Diagnosis not present

## 2019-10-20 DIAGNOSIS — S32591D Other specified fracture of right pubis, subsequent encounter for fracture with routine healing: Secondary | ICD-10-CM | POA: Diagnosis not present

## 2019-10-20 DIAGNOSIS — S7001XD Contusion of right hip, subsequent encounter: Secondary | ICD-10-CM | POA: Diagnosis not present

## 2019-10-20 DIAGNOSIS — E785 Hyperlipidemia, unspecified: Secondary | ICD-10-CM | POA: Diagnosis not present

## 2019-10-20 DIAGNOSIS — I10 Essential (primary) hypertension: Secondary | ICD-10-CM | POA: Diagnosis not present

## 2019-10-21 ENCOUNTER — Telehealth: Payer: Self-pay | Admitting: Oncology

## 2019-10-21 NOTE — Telephone Encounter (Signed)
I talk with patient regarding schedule  

## 2019-10-22 ENCOUNTER — Other Ambulatory Visit: Payer: Self-pay

## 2019-10-22 ENCOUNTER — Encounter: Payer: Self-pay | Admitting: Family Medicine

## 2019-10-22 ENCOUNTER — Ambulatory Visit (INDEPENDENT_AMBULATORY_CARE_PROVIDER_SITE_OTHER): Payer: Medicare HMO | Admitting: Family Medicine

## 2019-10-22 DIAGNOSIS — I429 Cardiomyopathy, unspecified: Secondary | ICD-10-CM | POA: Diagnosis not present

## 2019-10-22 DIAGNOSIS — S32591A Other specified fracture of right pubis, initial encounter for closed fracture: Secondary | ICD-10-CM

## 2019-10-22 DIAGNOSIS — E1169 Type 2 diabetes mellitus with other specified complication: Secondary | ICD-10-CM

## 2019-10-22 DIAGNOSIS — R300 Dysuria: Secondary | ICD-10-CM

## 2019-10-22 DIAGNOSIS — S7011XD Contusion of right thigh, subsequent encounter: Secondary | ICD-10-CM | POA: Diagnosis not present

## 2019-10-22 DIAGNOSIS — R296 Repeated falls: Secondary | ICD-10-CM | POA: Diagnosis not present

## 2019-10-22 DIAGNOSIS — Z87440 Personal history of urinary (tract) infections: Secondary | ICD-10-CM

## 2019-10-22 DIAGNOSIS — E785 Hyperlipidemia, unspecified: Secondary | ICD-10-CM | POA: Diagnosis not present

## 2019-10-22 DIAGNOSIS — D519 Vitamin B12 deficiency anemia, unspecified: Secondary | ICD-10-CM | POA: Diagnosis not present

## 2019-10-22 DIAGNOSIS — S32511D Fracture of superior rim of right pubis, subsequent encounter for fracture with routine healing: Secondary | ICD-10-CM | POA: Diagnosis not present

## 2019-10-22 DIAGNOSIS — I10 Essential (primary) hypertension: Secondary | ICD-10-CM

## 2019-10-22 DIAGNOSIS — E1165 Type 2 diabetes mellitus with hyperglycemia: Secondary | ICD-10-CM

## 2019-10-22 DIAGNOSIS — Z741 Need for assistance with personal care: Secondary | ICD-10-CM | POA: Diagnosis not present

## 2019-10-22 DIAGNOSIS — E119 Type 2 diabetes mellitus without complications: Secondary | ICD-10-CM | POA: Diagnosis not present

## 2019-10-22 DIAGNOSIS — R69 Illness, unspecified: Secondary | ICD-10-CM | POA: Diagnosis not present

## 2019-10-22 DIAGNOSIS — S32591D Other specified fracture of right pubis, subsequent encounter for fracture with routine healing: Secondary | ICD-10-CM | POA: Diagnosis not present

## 2019-10-22 DIAGNOSIS — R32 Unspecified urinary incontinence: Secondary | ICD-10-CM | POA: Diagnosis not present

## 2019-10-22 DIAGNOSIS — S7001XD Contusion of right hip, subsequent encounter: Secondary | ICD-10-CM | POA: Diagnosis not present

## 2019-10-22 DIAGNOSIS — N905 Atrophy of vulva: Secondary | ICD-10-CM | POA: Diagnosis not present

## 2019-10-22 NOTE — Assessment & Plan Note (Signed)
6 weeks out and pt is recovering well  Sees ortho-Dr Sabra Heck  Will soon transition to tramadol from norco for pain -using caution of sedation/falls  If able- would prefer tylenol alone  Will plan on scheduling a f/u with lab in early march

## 2019-10-22 NOTE — Assessment & Plan Note (Addendum)
Much improved per pt  Reviewed hospital records, lab results and studies in detail   Will re check cbc at visit in march
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
## 2019-10-22 NOTE — Assessment & Plan Note (Signed)
S/p hosp and pelvic fracture  Pt and family are looking at assisted living for her and I greatly approve-she would benefit from safety features/help with meds and socialization  They will update me as they search

## 2019-10-23 DIAGNOSIS — I429 Cardiomyopathy, unspecified: Secondary | ICD-10-CM | POA: Insufficient documentation

## 2019-10-23 NOTE — Assessment & Plan Note (Signed)
tx in the hospital Reviewed hospital records, lab results and studies in detail   Some dysuria (this is also chronic)  Family may decide to bring a urine sample for culture

## 2019-10-23 NOTE — Patient Instructions (Signed)
If urinary pain does not improve please bring a sample in next week for culture  Continue good fluid intake and diabetic diet Use walker at all times  Alert [place] if blood sugars increase  No change in medicines Follow up in march with labs prior

## 2019-10-23 NOTE — Assessment & Plan Note (Signed)
?   Acute on chronic Encouraged fluids  Family may bring sample for culture next week  uti was treated in the hospital

## 2019-10-23 NOTE — Assessment & Plan Note (Signed)
Discussed fall prevention again in detail  S/p rehab and getting help at home Thinking about assisted living residence in the future I think she would be a good candidate for that and thrive from the socialization  Family plans to begin searching

## 2019-10-23 NOTE — Assessment & Plan Note (Signed)
No clinical changes 

## 2019-10-23 NOTE — Assessment & Plan Note (Signed)
Pt was tx with lantus in the hospital and has since stopped it  She is afraid of low glucose readings Continues metformin and glipizide xl and per pt readings are home are stable Will re check A1C in march at f/u lantus is an option to re start

## 2019-10-23 NOTE — Assessment & Plan Note (Signed)
Controlled with simvastatin and diet

## 2019-10-23 NOTE — Assessment & Plan Note (Signed)
Stable BP w/o medication changes  Will plan f/u in march

## 2019-10-26 DIAGNOSIS — D519 Vitamin B12 deficiency anemia, unspecified: Secondary | ICD-10-CM | POA: Diagnosis not present

## 2019-10-26 DIAGNOSIS — S7001XD Contusion of right hip, subsequent encounter: Secondary | ICD-10-CM | POA: Diagnosis not present

## 2019-10-26 DIAGNOSIS — S32511D Fracture of superior rim of right pubis, subsequent encounter for fracture with routine healing: Secondary | ICD-10-CM | POA: Diagnosis not present

## 2019-10-26 DIAGNOSIS — E785 Hyperlipidemia, unspecified: Secondary | ICD-10-CM | POA: Diagnosis not present

## 2019-10-26 DIAGNOSIS — S32591D Other specified fracture of right pubis, subsequent encounter for fracture with routine healing: Secondary | ICD-10-CM | POA: Diagnosis not present

## 2019-10-26 DIAGNOSIS — R32 Unspecified urinary incontinence: Secondary | ICD-10-CM | POA: Diagnosis not present

## 2019-10-26 DIAGNOSIS — R69 Illness, unspecified: Secondary | ICD-10-CM | POA: Diagnosis not present

## 2019-10-26 DIAGNOSIS — N905 Atrophy of vulva: Secondary | ICD-10-CM | POA: Diagnosis not present

## 2019-10-26 DIAGNOSIS — E119 Type 2 diabetes mellitus without complications: Secondary | ICD-10-CM | POA: Diagnosis not present

## 2019-10-26 DIAGNOSIS — I10 Essential (primary) hypertension: Secondary | ICD-10-CM | POA: Diagnosis not present

## 2019-10-28 DIAGNOSIS — R69 Illness, unspecified: Secondary | ICD-10-CM | POA: Diagnosis not present

## 2019-10-28 DIAGNOSIS — S32591D Other specified fracture of right pubis, subsequent encounter for fracture with routine healing: Secondary | ICD-10-CM | POA: Diagnosis not present

## 2019-10-28 DIAGNOSIS — D519 Vitamin B12 deficiency anemia, unspecified: Secondary | ICD-10-CM | POA: Diagnosis not present

## 2019-10-28 DIAGNOSIS — I10 Essential (primary) hypertension: Secondary | ICD-10-CM | POA: Diagnosis not present

## 2019-10-28 DIAGNOSIS — N905 Atrophy of vulva: Secondary | ICD-10-CM | POA: Diagnosis not present

## 2019-10-28 DIAGNOSIS — E119 Type 2 diabetes mellitus without complications: Secondary | ICD-10-CM | POA: Diagnosis not present

## 2019-10-28 DIAGNOSIS — E785 Hyperlipidemia, unspecified: Secondary | ICD-10-CM | POA: Diagnosis not present

## 2019-10-28 DIAGNOSIS — S32511D Fracture of superior rim of right pubis, subsequent encounter for fracture with routine healing: Secondary | ICD-10-CM | POA: Diagnosis not present

## 2019-10-28 DIAGNOSIS — R32 Unspecified urinary incontinence: Secondary | ICD-10-CM | POA: Diagnosis not present

## 2019-10-28 DIAGNOSIS — S7001XD Contusion of right hip, subsequent encounter: Secondary | ICD-10-CM | POA: Diagnosis not present

## 2019-10-29 DIAGNOSIS — I10 Essential (primary) hypertension: Secondary | ICD-10-CM | POA: Diagnosis not present

## 2019-10-29 DIAGNOSIS — R69 Illness, unspecified: Secondary | ICD-10-CM | POA: Diagnosis not present

## 2019-10-29 DIAGNOSIS — R32 Unspecified urinary incontinence: Secondary | ICD-10-CM | POA: Diagnosis not present

## 2019-10-29 DIAGNOSIS — E785 Hyperlipidemia, unspecified: Secondary | ICD-10-CM | POA: Diagnosis not present

## 2019-10-29 DIAGNOSIS — E119 Type 2 diabetes mellitus without complications: Secondary | ICD-10-CM | POA: Diagnosis not present

## 2019-10-29 DIAGNOSIS — S7001XD Contusion of right hip, subsequent encounter: Secondary | ICD-10-CM | POA: Diagnosis not present

## 2019-10-29 DIAGNOSIS — S32591D Other specified fracture of right pubis, subsequent encounter for fracture with routine healing: Secondary | ICD-10-CM | POA: Diagnosis not present

## 2019-10-29 DIAGNOSIS — N905 Atrophy of vulva: Secondary | ICD-10-CM | POA: Diagnosis not present

## 2019-10-29 DIAGNOSIS — S32511D Fracture of superior rim of right pubis, subsequent encounter for fracture with routine healing: Secondary | ICD-10-CM | POA: Diagnosis not present

## 2019-10-29 DIAGNOSIS — D519 Vitamin B12 deficiency anemia, unspecified: Secondary | ICD-10-CM | POA: Diagnosis not present

## 2019-10-30 DIAGNOSIS — D519 Vitamin B12 deficiency anemia, unspecified: Secondary | ICD-10-CM | POA: Diagnosis not present

## 2019-10-30 DIAGNOSIS — R69 Illness, unspecified: Secondary | ICD-10-CM | POA: Diagnosis not present

## 2019-10-30 DIAGNOSIS — S7001XD Contusion of right hip, subsequent encounter: Secondary | ICD-10-CM | POA: Diagnosis not present

## 2019-10-30 DIAGNOSIS — I10 Essential (primary) hypertension: Secondary | ICD-10-CM | POA: Diagnosis not present

## 2019-10-30 DIAGNOSIS — E119 Type 2 diabetes mellitus without complications: Secondary | ICD-10-CM | POA: Diagnosis not present

## 2019-10-30 DIAGNOSIS — S32511D Fracture of superior rim of right pubis, subsequent encounter for fracture with routine healing: Secondary | ICD-10-CM | POA: Diagnosis not present

## 2019-10-30 DIAGNOSIS — S32591D Other specified fracture of right pubis, subsequent encounter for fracture with routine healing: Secondary | ICD-10-CM | POA: Diagnosis not present

## 2019-10-30 DIAGNOSIS — N905 Atrophy of vulva: Secondary | ICD-10-CM | POA: Diagnosis not present

## 2019-10-30 DIAGNOSIS — E785 Hyperlipidemia, unspecified: Secondary | ICD-10-CM | POA: Diagnosis not present

## 2019-10-30 DIAGNOSIS — R32 Unspecified urinary incontinence: Secondary | ICD-10-CM | POA: Diagnosis not present

## 2019-11-01 ENCOUNTER — Telehealth: Payer: Self-pay | Admitting: *Deleted

## 2019-11-01 DIAGNOSIS — R69 Illness, unspecified: Secondary | ICD-10-CM | POA: Diagnosis not present

## 2019-11-01 DIAGNOSIS — S32591D Other specified fracture of right pubis, subsequent encounter for fracture with routine healing: Secondary | ICD-10-CM | POA: Diagnosis not present

## 2019-11-01 DIAGNOSIS — I10 Essential (primary) hypertension: Secondary | ICD-10-CM | POA: Diagnosis not present

## 2019-11-01 DIAGNOSIS — S32511D Fracture of superior rim of right pubis, subsequent encounter for fracture with routine healing: Secondary | ICD-10-CM | POA: Diagnosis not present

## 2019-11-01 DIAGNOSIS — E785 Hyperlipidemia, unspecified: Secondary | ICD-10-CM | POA: Diagnosis not present

## 2019-11-01 DIAGNOSIS — D519 Vitamin B12 deficiency anemia, unspecified: Secondary | ICD-10-CM | POA: Diagnosis not present

## 2019-11-01 DIAGNOSIS — R32 Unspecified urinary incontinence: Secondary | ICD-10-CM | POA: Diagnosis not present

## 2019-11-01 DIAGNOSIS — E119 Type 2 diabetes mellitus without complications: Secondary | ICD-10-CM | POA: Diagnosis not present

## 2019-11-01 DIAGNOSIS — N905 Atrophy of vulva: Secondary | ICD-10-CM | POA: Diagnosis not present

## 2019-11-01 DIAGNOSIS — S7001XD Contusion of right hip, subsequent encounter: Secondary | ICD-10-CM | POA: Diagnosis not present

## 2019-11-01 NOTE — Telephone Encounter (Signed)
Please verbally ok those orders 

## 2019-11-01 NOTE — Telephone Encounter (Signed)
Colletta Maryland OT with [facility] left a voicemail requesting verbal orders to continue OT for once a week for 4 weeks.

## 2019-11-02 DIAGNOSIS — R32 Unspecified urinary incontinence: Secondary | ICD-10-CM | POA: Diagnosis not present

## 2019-11-02 DIAGNOSIS — S7001XD Contusion of right hip, subsequent encounter: Secondary | ICD-10-CM | POA: Diagnosis not present

## 2019-11-02 DIAGNOSIS — S32591D Other specified fracture of right pubis, subsequent encounter for fracture with routine healing: Secondary | ICD-10-CM | POA: Diagnosis not present

## 2019-11-02 DIAGNOSIS — I10 Essential (primary) hypertension: Secondary | ICD-10-CM | POA: Diagnosis not present

## 2019-11-02 DIAGNOSIS — S32511D Fracture of superior rim of right pubis, subsequent encounter for fracture with routine healing: Secondary | ICD-10-CM | POA: Diagnosis not present

## 2019-11-02 DIAGNOSIS — D519 Vitamin B12 deficiency anemia, unspecified: Secondary | ICD-10-CM | POA: Diagnosis not present

## 2019-11-02 DIAGNOSIS — R69 Illness, unspecified: Secondary | ICD-10-CM | POA: Diagnosis not present

## 2019-11-02 DIAGNOSIS — N905 Atrophy of vulva: Secondary | ICD-10-CM | POA: Diagnosis not present

## 2019-11-02 DIAGNOSIS — E785 Hyperlipidemia, unspecified: Secondary | ICD-10-CM | POA: Diagnosis not present

## 2019-11-02 DIAGNOSIS — E119 Type 2 diabetes mellitus without complications: Secondary | ICD-10-CM | POA: Diagnosis not present

## 2019-11-02 NOTE — Telephone Encounter (Signed)
Called # left and it was the wrong #, checked chart and called # in prev message for Laura Bell [phone] an d left VM giving her verbal order

## 2019-11-03 DIAGNOSIS — N905 Atrophy of vulva: Secondary | ICD-10-CM | POA: Diagnosis not present

## 2019-11-03 DIAGNOSIS — D519 Vitamin B12 deficiency anemia, unspecified: Secondary | ICD-10-CM | POA: Diagnosis not present

## 2019-11-03 DIAGNOSIS — S7001XD Contusion of right hip, subsequent encounter: Secondary | ICD-10-CM | POA: Diagnosis not present

## 2019-11-03 DIAGNOSIS — S32591D Other specified fracture of right pubis, subsequent encounter for fracture with routine healing: Secondary | ICD-10-CM | POA: Diagnosis not present

## 2019-11-03 DIAGNOSIS — E119 Type 2 diabetes mellitus without complications: Secondary | ICD-10-CM | POA: Diagnosis not present

## 2019-11-03 DIAGNOSIS — I10 Essential (primary) hypertension: Secondary | ICD-10-CM | POA: Diagnosis not present

## 2019-11-03 DIAGNOSIS — E785 Hyperlipidemia, unspecified: Secondary | ICD-10-CM | POA: Diagnosis not present

## 2019-11-03 DIAGNOSIS — R69 Illness, unspecified: Secondary | ICD-10-CM | POA: Diagnosis not present

## 2019-11-03 DIAGNOSIS — R32 Unspecified urinary incontinence: Secondary | ICD-10-CM | POA: Diagnosis not present

## 2019-11-03 DIAGNOSIS — S32511D Fracture of superior rim of right pubis, subsequent encounter for fracture with routine healing: Secondary | ICD-10-CM | POA: Diagnosis not present

## 2019-11-04 DIAGNOSIS — R32 Unspecified urinary incontinence: Secondary | ICD-10-CM | POA: Diagnosis not present

## 2019-11-04 DIAGNOSIS — N905 Atrophy of vulva: Secondary | ICD-10-CM | POA: Diagnosis not present

## 2019-11-04 DIAGNOSIS — S7001XD Contusion of right hip, subsequent encounter: Secondary | ICD-10-CM | POA: Diagnosis not present

## 2019-11-04 DIAGNOSIS — D519 Vitamin B12 deficiency anemia, unspecified: Secondary | ICD-10-CM | POA: Diagnosis not present

## 2019-11-04 DIAGNOSIS — E119 Type 2 diabetes mellitus without complications: Secondary | ICD-10-CM | POA: Diagnosis not present

## 2019-11-04 DIAGNOSIS — S32511D Fracture of superior rim of right pubis, subsequent encounter for fracture with routine healing: Secondary | ICD-10-CM | POA: Diagnosis not present

## 2019-11-04 DIAGNOSIS — I10 Essential (primary) hypertension: Secondary | ICD-10-CM | POA: Diagnosis not present

## 2019-11-04 DIAGNOSIS — R69 Illness, unspecified: Secondary | ICD-10-CM | POA: Diagnosis not present

## 2019-11-04 DIAGNOSIS — S32591D Other specified fracture of right pubis, subsequent encounter for fracture with routine healing: Secondary | ICD-10-CM | POA: Diagnosis not present

## 2019-11-04 DIAGNOSIS — E785 Hyperlipidemia, unspecified: Secondary | ICD-10-CM | POA: Diagnosis not present

## 2019-11-08 ENCOUNTER — Ambulatory Visit: Payer: Medicare HMO | Admitting: Obstetrics & Gynecology

## 2019-11-11 DIAGNOSIS — E785 Hyperlipidemia, unspecified: Secondary | ICD-10-CM | POA: Diagnosis not present

## 2019-11-11 DIAGNOSIS — R32 Unspecified urinary incontinence: Secondary | ICD-10-CM | POA: Diagnosis not present

## 2019-11-11 DIAGNOSIS — S32511D Fracture of superior rim of right pubis, subsequent encounter for fracture with routine healing: Secondary | ICD-10-CM | POA: Diagnosis not present

## 2019-11-11 DIAGNOSIS — S7001XD Contusion of right hip, subsequent encounter: Secondary | ICD-10-CM | POA: Diagnosis not present

## 2019-11-11 DIAGNOSIS — R69 Illness, unspecified: Secondary | ICD-10-CM | POA: Diagnosis not present

## 2019-11-11 DIAGNOSIS — I10 Essential (primary) hypertension: Secondary | ICD-10-CM | POA: Diagnosis not present

## 2019-11-11 DIAGNOSIS — D519 Vitamin B12 deficiency anemia, unspecified: Secondary | ICD-10-CM | POA: Diagnosis not present

## 2019-11-11 DIAGNOSIS — S32591D Other specified fracture of right pubis, subsequent encounter for fracture with routine healing: Secondary | ICD-10-CM | POA: Diagnosis not present

## 2019-11-11 DIAGNOSIS — N905 Atrophy of vulva: Secondary | ICD-10-CM | POA: Diagnosis not present

## 2019-11-11 DIAGNOSIS — E119 Type 2 diabetes mellitus without complications: Secondary | ICD-10-CM | POA: Diagnosis not present

## 2019-11-16 DIAGNOSIS — D519 Vitamin B12 deficiency anemia, unspecified: Secondary | ICD-10-CM | POA: Diagnosis not present

## 2019-11-16 DIAGNOSIS — E119 Type 2 diabetes mellitus without complications: Secondary | ICD-10-CM | POA: Diagnosis not present

## 2019-11-16 DIAGNOSIS — E785 Hyperlipidemia, unspecified: Secondary | ICD-10-CM | POA: Diagnosis not present

## 2019-11-16 DIAGNOSIS — S32591D Other specified fracture of right pubis, subsequent encounter for fracture with routine healing: Secondary | ICD-10-CM | POA: Diagnosis not present

## 2019-11-16 DIAGNOSIS — N905 Atrophy of vulva: Secondary | ICD-10-CM | POA: Diagnosis not present

## 2019-11-16 DIAGNOSIS — R32 Unspecified urinary incontinence: Secondary | ICD-10-CM | POA: Diagnosis not present

## 2019-11-16 DIAGNOSIS — R69 Illness, unspecified: Secondary | ICD-10-CM | POA: Diagnosis not present

## 2019-11-16 DIAGNOSIS — S32511D Fracture of superior rim of right pubis, subsequent encounter for fracture with routine healing: Secondary | ICD-10-CM | POA: Diagnosis not present

## 2019-11-16 DIAGNOSIS — S7001XD Contusion of right hip, subsequent encounter: Secondary | ICD-10-CM | POA: Diagnosis not present

## 2019-11-16 DIAGNOSIS — I10 Essential (primary) hypertension: Secondary | ICD-10-CM | POA: Diagnosis not present

## 2019-11-17 DIAGNOSIS — S32810A Multiple fractures of pelvis with stable disruption of pelvic ring, initial encounter for closed fracture: Secondary | ICD-10-CM | POA: Diagnosis not present

## 2019-11-22 DIAGNOSIS — S7001XD Contusion of right hip, subsequent encounter: Secondary | ICD-10-CM | POA: Diagnosis not present

## 2019-11-22 DIAGNOSIS — R32 Unspecified urinary incontinence: Secondary | ICD-10-CM | POA: Diagnosis not present

## 2019-11-22 DIAGNOSIS — I10 Essential (primary) hypertension: Secondary | ICD-10-CM | POA: Diagnosis not present

## 2019-11-22 DIAGNOSIS — E119 Type 2 diabetes mellitus without complications: Secondary | ICD-10-CM | POA: Diagnosis not present

## 2019-11-22 DIAGNOSIS — E785 Hyperlipidemia, unspecified: Secondary | ICD-10-CM | POA: Diagnosis not present

## 2019-11-22 DIAGNOSIS — D519 Vitamin B12 deficiency anemia, unspecified: Secondary | ICD-10-CM | POA: Diagnosis not present

## 2019-11-22 DIAGNOSIS — S32591D Other specified fracture of right pubis, subsequent encounter for fracture with routine healing: Secondary | ICD-10-CM | POA: Diagnosis not present

## 2019-11-22 DIAGNOSIS — R69 Illness, unspecified: Secondary | ICD-10-CM | POA: Diagnosis not present

## 2019-11-22 DIAGNOSIS — S32511D Fracture of superior rim of right pubis, subsequent encounter for fracture with routine healing: Secondary | ICD-10-CM | POA: Diagnosis not present

## 2019-11-22 DIAGNOSIS — N905 Atrophy of vulva: Secondary | ICD-10-CM | POA: Diagnosis not present

## 2019-11-29 DIAGNOSIS — S32591D Other specified fracture of right pubis, subsequent encounter for fracture with routine healing: Secondary | ICD-10-CM | POA: Diagnosis not present

## 2019-11-29 DIAGNOSIS — R32 Unspecified urinary incontinence: Secondary | ICD-10-CM | POA: Diagnosis not present

## 2019-11-29 DIAGNOSIS — N905 Atrophy of vulva: Secondary | ICD-10-CM | POA: Diagnosis not present

## 2019-11-29 DIAGNOSIS — E785 Hyperlipidemia, unspecified: Secondary | ICD-10-CM | POA: Diagnosis not present

## 2019-11-29 DIAGNOSIS — S7001XD Contusion of right hip, subsequent encounter: Secondary | ICD-10-CM | POA: Diagnosis not present

## 2019-11-29 DIAGNOSIS — S32511D Fracture of superior rim of right pubis, subsequent encounter for fracture with routine healing: Secondary | ICD-10-CM | POA: Diagnosis not present

## 2019-11-29 DIAGNOSIS — D519 Vitamin B12 deficiency anemia, unspecified: Secondary | ICD-10-CM | POA: Diagnosis not present

## 2019-11-29 DIAGNOSIS — R69 Illness, unspecified: Secondary | ICD-10-CM | POA: Diagnosis not present

## 2019-11-29 DIAGNOSIS — E119 Type 2 diabetes mellitus without complications: Secondary | ICD-10-CM | POA: Diagnosis not present

## 2019-11-29 DIAGNOSIS — I10 Essential (primary) hypertension: Secondary | ICD-10-CM | POA: Diagnosis not present

## 2019-11-30 ENCOUNTER — Other Ambulatory Visit (INDEPENDENT_AMBULATORY_CARE_PROVIDER_SITE_OTHER): Payer: Medicare HMO

## 2019-11-30 ENCOUNTER — Other Ambulatory Visit: Payer: Self-pay

## 2019-11-30 ENCOUNTER — Other Ambulatory Visit: Payer: Self-pay | Admitting: Family Medicine

## 2019-11-30 DIAGNOSIS — E1165 Type 2 diabetes mellitus with hyperglycemia: Secondary | ICD-10-CM | POA: Diagnosis not present

## 2019-11-30 DIAGNOSIS — S7011XD Contusion of right thigh, subsequent encounter: Secondary | ICD-10-CM

## 2019-11-30 DIAGNOSIS — E1169 Type 2 diabetes mellitus with other specified complication: Secondary | ICD-10-CM | POA: Diagnosis not present

## 2019-11-30 DIAGNOSIS — I10 Essential (primary) hypertension: Secondary | ICD-10-CM | POA: Diagnosis not present

## 2019-11-30 DIAGNOSIS — E785 Hyperlipidemia, unspecified: Secondary | ICD-10-CM

## 2019-11-30 LAB — COMPREHENSIVE METABOLIC PANEL
ALT: 6 U/L (ref 0–35)
AST: 9 U/L (ref 0–37)
Albumin: 3.9 g/dL (ref 3.5–5.2)
Alkaline Phosphatase: 95 U/L (ref 39–117)
BUN: 10 mg/dL (ref 6–23)
CO2: 29 mEq/L (ref 19–32)
Calcium: 10 mg/dL (ref 8.4–10.5)
Chloride: 98 mEq/L (ref 96–112)
Creatinine, Ser: 0.6 mg/dL (ref 0.40–1.20)
GFR: 95.8 mL/min (ref >60.00)
Glucose, Bld: 231 mg/dL — ABNORMAL HIGH (ref 70–99)
Potassium: 4.5 mEq/L (ref 3.5–5.1)
Sodium: 132 mEq/L — ABNORMAL LOW (ref 135–145)
Total Bilirubin: 0.3 mg/dL (ref 0.2–1.2)
Total Protein: 6.5 g/dL (ref 6.0–8.3)

## 2019-11-30 LAB — LIPID PANEL
Cholesterol: 148 mg/dL (ref 0–200)
HDL: 72.3 mg/dL (ref >39.00)
LDL Cholesterol: 51 mg/dL (ref 0–99)
NonHDL: 76.16
Total CHOL/HDL Ratio: 2
Triglycerides: 128 mg/dL (ref 0.0–149.0)
VLDL: 25.6 mg/dL (ref 0.0–40.0)

## 2019-11-30 LAB — CBC WITH DIFFERENTIAL/PLATELET
Basophils Absolute: 0 10*3/uL (ref 0.0–0.1)
Basophils Relative: 0.5 % (ref 0.0–3.0)
Eosinophils Absolute: 0.2 10*3/uL (ref 0.0–0.7)
Eosinophils Relative: 2.2 % (ref 0.0–5.0)
HCT: 36.6 % (ref 36.0–46.0)
Hemoglobin: 11.6 g/dL — ABNORMAL LOW (ref 12.0–15.0)
Lymphocytes Relative: 17.3 % (ref 12.0–46.0)
Lymphs Abs: 1.7 10*3/uL (ref 0.7–4.0)
MCHC: 31.8 g/dL (ref 30.0–36.0)
MCV: 82 fl (ref 78.0–100.0)
Monocytes Absolute: 0.9 10*3/uL (ref 0.1–1.0)
Monocytes Relative: 9.3 % (ref 3.0–12.0)
Neutro Abs: 7 10*3/uL (ref 1.4–7.7)
Neutrophils Relative %: 70.7 % (ref 43.0–77.0)
Platelets: 324 10*3/uL (ref 150.0–400.0)
RBC: 4.46 Mil/uL (ref 3.87–5.11)
RDW: 15.1 % (ref 11.5–15.5)
WBC: 9.9 10*3/uL (ref 4.0–10.5)

## 2019-11-30 LAB — HEMOGLOBIN A1C: Hgb A1c MFr Bld: 8.4 % — ABNORMAL HIGH (ref 4.6–6.5)

## 2019-11-30 LAB — FERRITIN: Ferritin: 7.7 ng/mL — ABNORMAL LOW (ref 10.0–291.0)

## 2019-12-06 ENCOUNTER — Encounter: Payer: Self-pay | Admitting: Obstetrics & Gynecology

## 2019-12-06 ENCOUNTER — Ambulatory Visit (INDEPENDENT_AMBULATORY_CARE_PROVIDER_SITE_OTHER): Payer: Medicare HMO | Admitting: Obstetrics & Gynecology

## 2019-12-06 ENCOUNTER — Other Ambulatory Visit: Payer: Self-pay

## 2019-12-06 VITALS — BP 140/80 | Ht 62.0 in | Wt 140.0 lb

## 2019-12-06 DIAGNOSIS — N952 Postmenopausal atrophic vaginitis: Secondary | ICD-10-CM | POA: Diagnosis not present

## 2019-12-06 DIAGNOSIS — N8110 Cystocele, unspecified: Secondary | ICD-10-CM

## 2019-12-06 DIAGNOSIS — R69 Illness, unspecified: Secondary | ICD-10-CM | POA: Diagnosis not present

## 2019-12-06 DIAGNOSIS — Z90710 Acquired absence of both cervix and uterus: Secondary | ICD-10-CM | POA: Diagnosis not present

## 2019-12-06 NOTE — Progress Notes (Signed)
HPI:      [name] is a 84 y.o. No obstetric history on file. who presents today for her pessary follow up and examination related to her pelvic floor weakening.  Pt reports tolerating the pessary well with no vaginal bleeding and no vaginal discharge.  Symptoms of pelvic floor weakening have greatly improved. She is voiding and defecating without difficulty. She currently has a #5 Ring pessary.  PMHx: She  has a past medical history of Arthritis, Bleeding disorder ([place]), Breast cancer ([place]) (10/11/13 dx), Cardiomyopathy ([place]), Cervical cancer ([place]), Demand ischemia ([place]), Full dentures, GERD (gastroesophageal reflux disease), History of stomach ulcers, Hypertension, Mixed hyperlipidemia, Osteoarthritis, Osteopenia, Stress reaction (2009), Type II diabetes mellitus ([place]), Urinary incontinence, Uterus cancer ([place]), and Varicose veins. Also,  has a past surgical history that includes Abdominal hysterectomy (1964); Bladder surgery (1995); Abdominal US (2007); Sclerotherapy varicose veins (5/08); left breast bx (1/15); Breast lumpectomy with needle localization (Left, 11/10/2013); Breast biopsy (Left, 09/2013); Breast biopsy (Left, 12/2014); and Breast lumpectomy (Left, 10/2013)., family history includes Cancer in her brother, brother, father, and sister; Cancer (age of onset: 7) in an other family member; Heart attack in her mother.,  reports that she quit smoking about 24 years ago. Her smoking use included cigarettes. She has a 39.20 pack-year smoking history. She has never used smokeless tobacco. She reports that she does not drink alcohol or use drugs.  She has a current medication list which includes the following prescription(s): accu-chek softclix lancets, acetaminophen, aspirin ec, onetouch verio, vitamin d, citalopram, clotrimazole-betamethasone, conjugated estrogens, cyanocobalamin, famotidine, glipizide, onetouch verio, hydrocodone-acetaminophen, lisinopril, metformin, metoprolol  succinate, nystatin-triamcinolone ointment, polyethylene glycol, and simvastatin. Also, is allergic to actos [pioglitazone]; alendronate sodium; boniva [ibandronate sodium]; ibandronic acid; lansoprazole; and omeprazole.  Review of Systems  All other systems reviewed and are negative. Recent hip fracture after fall  Objective: BP 140/80   Ht 5\' 2"  (1.575 m)   Wt 140 lb (63.5 kg)   BMI 25.61 kg/m  Physical Exam Constitutional:      General: She is not in acute distress.    Appearance: She is well-developed.  Genitourinary:     Pelvic exam was performed with patient supine.     Vagina normal.     No vaginal erythema or bleeding.     Genitourinary Comments: Cuff intact/ no lesions Mild atrophy, vag prolapse present Absent uterus and cervix  HENT:     Head: Normocephalic and atraumatic.     Nose: Nose normal.  Abdominal:     General: There is no distension.     Palpations: Abdomen is soft.     Tenderness: There is no abdominal tenderness.  Musculoskeletal:        General: Normal range of motion.  Neurological:     Mental Status: She is alert and oriented to person, place, and time.     Cranial Nerves: No cranial nerve deficit.  Skin:    General: Skin is warm and dry.  Psychiatric:        Attention and Perception: Attention normal.        Mood and Affect: Mood normal.        Speech: Speech normal.        Behavior: Behavior normal.        Cognition and Memory: Cognition normal.        Judgment: Judgment normal.     Pessary Care Pessary removed and cleaned.  Vagina checked - without erosions - pessary replaced.  A/P:  ICD-10-CM   1. Vaginal atrophy  N95.2   2. Vaginal prolapse  N81.10    Pessary was cleaned and replaced today. Instructions given for care. Concerning symptoms to observe for are counseled to patient. Follow up scheduled for 3 months.  A total of 20 minutes were spent face-to-face with the patient as well as preparation, review, communication, and  documentation during this encounter.   Barnett Applebaum, MD, Loura Pardon Ob/Gyn, [facility] 12/06/2019  2:05 PM

## 2019-12-07 ENCOUNTER — Ambulatory Visit: Payer: Medicare HMO | Admitting: Family Medicine

## 2019-12-07 DIAGNOSIS — Z0289 Encounter for other administrative examinations: Secondary | ICD-10-CM

## 2019-12-16 ENCOUNTER — Other Ambulatory Visit: Payer: Self-pay | Admitting: Family Medicine

## 2019-12-16 DIAGNOSIS — R69 Illness, unspecified: Secondary | ICD-10-CM | POA: Diagnosis not present

## 2019-12-23 ENCOUNTER — Other Ambulatory Visit: Payer: Self-pay

## 2019-12-23 ENCOUNTER — Encounter: Payer: Self-pay | Admitting: Family Medicine

## 2019-12-23 ENCOUNTER — Ambulatory Visit (INDEPENDENT_AMBULATORY_CARE_PROVIDER_SITE_OTHER): Payer: Medicare HMO | Admitting: Family Medicine

## 2019-12-23 VITALS — BP 126/76 | HR 84 | Temp 97.7°F | Ht 62.0 in | Wt 144.2 lb

## 2019-12-23 DIAGNOSIS — E785 Hyperlipidemia, unspecified: Secondary | ICD-10-CM | POA: Diagnosis not present

## 2019-12-23 DIAGNOSIS — I10 Essential (primary) hypertension: Secondary | ICD-10-CM

## 2019-12-23 DIAGNOSIS — N898 Other specified noninflammatory disorders of vagina: Secondary | ICD-10-CM | POA: Diagnosis not present

## 2019-12-23 DIAGNOSIS — D649 Anemia, unspecified: Secondary | ICD-10-CM | POA: Diagnosis not present

## 2019-12-23 DIAGNOSIS — E1165 Type 2 diabetes mellitus with hyperglycemia: Secondary | ICD-10-CM

## 2019-12-23 DIAGNOSIS — E1169 Type 2 diabetes mellitus with other specified complication: Secondary | ICD-10-CM | POA: Diagnosis not present

## 2019-12-23 MED ORDER — TERCONAZOLE 0.4 % VA CREA: TOPICAL_CREAM | VAGINAL | 0 refills | Status: DC

## 2019-12-23 NOTE — Assessment & Plan Note (Signed)
Well controlled with simvastatin and diet  LDL of 51 Disc goals for lipids and reasons to control them Rev last labs with pt Rev low sat fat diet in detail

## 2019-12-23 NOTE — Assessment & Plan Note (Signed)
Suspect yeast (symptoms are external) Sent in terazol cream  inst to call if no improvement

## 2019-12-23 NOTE — Assessment & Plan Note (Signed)
bp in fair control at this time  BP Readings from Last 1 Encounters:  12/23/19 126/76   No changes needed Most recent labs reviewed  Disc lifstyle change with low sodium diet and exercise

## 2019-12-23 NOTE — Patient Instructions (Addendum)
Don't forget to get your eye exam   Call and make an appt. With Dr Milinda Pointer for foot / nail care   Options for diabetes (not at goal)  We can start some daily basal insulin (lantus) We can refer you to an endocrinologist We can do nothing (continue current medicines)   When you make a decision please let me know    Eating well helps   Take care of yourself

## 2019-12-23 NOTE — Assessment & Plan Note (Signed)
Lab Results  Component Value Date   HGBA1C 8.4 (H) 11/30/2019   Pt is apprehensive to add medication today (I would suggest lantus which she did well with in the hospital)  She plans to discuss with her family  Also given the option for endocrinology referral  She may choose not to change anything  Continues glipizide xl and metformin  No hypoglycemia and diet is overall fair  Taking statin and ace  Plans to schedule an eye exam
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
## 2019-12-23 NOTE — Assessment & Plan Note (Signed)
Worsened with surgery recently -now improved  Hb is now up to 11.6 with low ferritin Will continue to track

## 2019-12-29 ENCOUNTER — Telehealth: Payer: Self-pay | Admitting: Family Medicine

## 2019-12-29 NOTE — Telephone Encounter (Signed)
Patient called and said she's decided not to be on insulin.

## 2019-12-29 NOTE — Telephone Encounter (Signed)
I understand, thanks for letting me know.  Her next follow up for diabetes would ordinarily be 6 months (that is of course up to her as well)  Eat as well as she can/ she is aware.

## 2019-12-30 NOTE — Telephone Encounter (Signed)
Pt notified of Dr. Marliss Coots comments. She did still want to keep her f/u appts with PCP. 6 month f/u appt scheduled

## 2019-12-31 ENCOUNTER — Emergency Department
Admission: EM | Admit: 2019-12-31 | Discharge: 2019-12-31 | Disposition: A | Discharge status: Home / Self Care | Payer: Medicare HMO | Attending: Emergency Medicine | Admitting: Emergency Medicine

## 2019-12-31 ENCOUNTER — Other Ambulatory Visit: Payer: Self-pay

## 2019-12-31 ENCOUNTER — Emergency Department: Payer: Medicare HMO

## 2019-12-31 DIAGNOSIS — Z853 Personal history of malignant neoplasm of breast: Secondary | ICD-10-CM | POA: Diagnosis not present

## 2019-12-31 DIAGNOSIS — Y939 Activity, unspecified: Secondary | ICD-10-CM | POA: Diagnosis not present

## 2019-12-31 DIAGNOSIS — Z7982 Long term (current) use of aspirin: Secondary | ICD-10-CM | POA: Insufficient documentation

## 2019-12-31 DIAGNOSIS — S0083XA Contusion of other part of head, initial encounter: Secondary | ICD-10-CM | POA: Diagnosis not present

## 2019-12-31 DIAGNOSIS — Y92009 Unspecified place in unspecified non-institutional (private) residence as the place of occurrence of the external cause: Secondary | ICD-10-CM

## 2019-12-31 DIAGNOSIS — M542 Cervicalgia: Secondary | ICD-10-CM | POA: Diagnosis not present

## 2019-12-31 DIAGNOSIS — S299XXA Unspecified injury of thorax, initial encounter: Secondary | ICD-10-CM | POA: Diagnosis not present

## 2019-12-31 DIAGNOSIS — Z7984 Long term (current) use of oral hypoglycemic drugs: Secondary | ICD-10-CM | POA: Diagnosis not present

## 2019-12-31 DIAGNOSIS — Z87891 Personal history of nicotine dependence: Secondary | ICD-10-CM | POA: Insufficient documentation

## 2019-12-31 DIAGNOSIS — I10 Essential (primary) hypertension: Secondary | ICD-10-CM | POA: Diagnosis not present

## 2019-12-31 DIAGNOSIS — S0990XA Unspecified injury of head, initial encounter: Secondary | ICD-10-CM | POA: Diagnosis not present

## 2019-12-31 DIAGNOSIS — Y999 Unspecified external cause status: Secondary | ICD-10-CM | POA: Diagnosis not present

## 2019-12-31 DIAGNOSIS — Y929 Unspecified place or not applicable: Secondary | ICD-10-CM | POA: Insufficient documentation

## 2019-12-31 DIAGNOSIS — W19XXXA Unspecified fall, initial encounter: Secondary | ICD-10-CM | POA: Diagnosis not present

## 2019-12-31 DIAGNOSIS — M25511 Pain in right shoulder: Secondary | ICD-10-CM | POA: Diagnosis not present

## 2019-12-31 DIAGNOSIS — M546 Pain in thoracic spine: Secondary | ICD-10-CM | POA: Diagnosis not present

## 2019-12-31 DIAGNOSIS — E119 Type 2 diabetes mellitus without complications: Secondary | ICD-10-CM | POA: Insufficient documentation

## 2019-12-31 DIAGNOSIS — Z79899 Other long term (current) drug therapy: Secondary | ICD-10-CM | POA: Insufficient documentation

## 2019-12-31 MED ORDER — LIDOCAINE 5 % EX PTCH: 1.0000 | MEDICATED_PATCH | Freq: Two times a day (BID) | CUTANEOUS | 0 refills | Status: AC

## 2019-12-31 MED ORDER — TRAMADOL HCL 50 MG PO TABS
50.0000 mg | ORAL_TABLET | Freq: Once | ORAL | Status: AC
  Administered 2019-12-31: 50 mg via ORAL
  Filled 2019-12-31: qty 1

## 2019-12-31 MED ORDER — LIDOCAINE 5 % EX PTCH
1.0000 | MEDICATED_PATCH | Freq: Once | CUTANEOUS | Status: DC
  Administered 2019-12-31: 1 via TRANSDERMAL
  Filled 2019-12-31: qty 1
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
## 2019-12-31 NOTE — ED Triage Notes (Addendum)
FIRST NURSE NOTE- tripped and fell taking trash out. C/o right shoulder pain. Able to raise arm above head.

## 2019-12-31 NOTE — Discharge Instructions (Signed)
Your exam and CT scans do not reveal any serious injury following your fall. You should take you home Ultram as needed. You can use the Lidoderm patches as directed. Follow-up with Dr. Glori Bickers as needed.

## 2019-12-31 NOTE — ED Triage Notes (Signed)
Pt comes via POV from home with c/o right shoulder pain following an trip and fall. Pt states she was taking the trash out and tripped over something.   Pt denies LOC or blood thinners. Pt denies hitting head.

## 2019-12-31 NOTE — ED Notes (Signed)
See triage note  Presents s/p fall  Having pain across upper back and into right shoulder

## 2020-01-19 ENCOUNTER — Other Ambulatory Visit: Payer: Self-pay

## 2020-01-19 ENCOUNTER — Ambulatory Visit (INDEPENDENT_AMBULATORY_CARE_PROVIDER_SITE_OTHER): Payer: Medicare HMO | Admitting: Family Medicine

## 2020-01-19 ENCOUNTER — Encounter: Payer: Self-pay | Admitting: Family Medicine

## 2020-01-19 VITALS — BP 146/78 | HR 95 | Temp 97.7°F | Ht 62.0 in | Wt 142.6 lb

## 2020-01-19 DIAGNOSIS — R2689 Other abnormalities of gait and mobility: Secondary | ICD-10-CM | POA: Diagnosis not present

## 2020-01-19 DIAGNOSIS — W19XXXS Unspecified fall, sequela: Secondary | ICD-10-CM

## 2020-01-19 DIAGNOSIS — M542 Cervicalgia: Secondary | ICD-10-CM | POA: Diagnosis not present
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
## 2020-01-19 NOTE — Patient Instructions (Addendum)
Remember not to go anywhere that you cannot use a walker  Please use more caution   You may be better off living in a place with more people (like a retirement community or assisted living)   Try a lidocaine patch over the counter-if not helpful let me know  Tylenol is ok  Add tramadol only if pain is severe  Heat is also helpful - use heating pad - for 10 minutes at a time (no longer)   I will consult [place] [facility] [place] network care team to see if they can help you re: assistance with living/safety   If pain gets worse or any new symptoms

## 2020-01-19 NOTE — Assessment & Plan Note (Signed)
This time s/p fall on L side  CS CT scan is reassuring/rev with pt  Noted cervical spondylosis  Pain today is muscular with no s/s of radiculopathy Plans to try lidocaine patch otc along with tylenol  (has tramadol for severe pain but should not need)  Disc s/s of neuro injury to watch for  Update if not starting to improve in a week or if worsening

## 2020-01-19 NOTE — Assessment & Plan Note (Signed)
High fall risk  Enc again to use walker  Has had PT  Needs help with ADLs and more monitoring (enc use of walker at all times)

## 2020-01-19 NOTE — Assessment & Plan Note (Signed)
Another fall at home - taking out garbage w/o walking  Making some poor decisions (but no dementia) Needs some safety assurance-pt and her daughter agree that living in a retirement community or asst living would be smart  Disc fall prev in great detail again  With baseline poor balance enc her to never be without walker  Rev ED notes and studies  Reviewed hospital records, lab results and studies in detail   Exam is re assuring  Ref placed to [facility] care management to help out with resources for help with ADLs and possible move/placement

## 2020-02-02 ENCOUNTER — Other Ambulatory Visit: Payer: Self-pay | Admitting: *Deleted

## 2020-02-02 ENCOUNTER — Encounter: Payer: Self-pay | Admitting: *Deleted

## 2020-02-02 NOTE — Patient Outreach (Addendum)
[facility]) Care Management  02/02/2020  VELMAR DEADRICK [birth year] [id number]   Subjective: Telephone call to patient's home number, spoke with patient, and HIPAA verified.  Discussed [place] Medicare MD referral  follow up, patient voiced understanding, and is in agreement to follow up.  Patient states she is doing pretty good, is aware of referral from Dr. Glori Bickers, and has not had any additional falls since last ED visit on 12/31/2019.   States she has a rollator walker that has been very helpful, helps with balance,  is working on remembering to use it at all times, is accustomed to being independent, and this has been a hard adjustment for her, not walking without it.  States she likes to keep busy, does not like to just sit around, and is always doing something.  States her daughters provide intermittent supervision as needed, fill medication pill pack / pill box, assist with activities of daily living, and home management.  Patient states she is able to manage some self care and has assistance as needed.  Patient voices understanding of medical diagnosis and treatment plan.  States she is accessing her [employer] benefits as needed via member services number on back of card. States she is needing more assistance, requesting assistance with assisted living facility placement, and is in agreement to a referral to [facility] Worker for assisted living facility placement.  Patient states she does not have any education material,  disease management, disease monitoring, transportation, or pharmacy needs at this time.  States she is very Patent attorney of the follow up,  is in agreement to receive [place] Management information, and Education officer, museum services.     Objective:  Per KPN (Knowledge Performance Now, point of care tool) and chart review, patient hospitalized 09/04/2019 - 09/13/2019 for Pubic ramus fracture, discharged to [facility] for rehab. Patient had ED  visit on 12/31/2019 for status post fall with injury.    Patient also has a history of diabetes, hypertension, mixed HLD, Hematoma of right thigh, Arthritis, Bleeding disorder , Breast cancer , Cardiomyopathy, Cervical cancer , Demand ischemia, Osteoarthritis, Uterus cancer, Varicose veins, and B12 deficiency.       Assessment: Received Bernadene Person MD Referral on 01/19/2020.  Referral source: Dr. Loura Pardon.   Referral reason: Reason for consult patient has safety issues at home with falls -needs assistance to find some help/or alt living facility options.   Pt has poor balance and chronic medical problems. Frequent falls at home. Poor balance.  Interested in finding a place to live with more assistance (? If social worker could help) Some trouble doing her Activities of  Daily Living on her own.   Screening completed, no RNCM needs,  and will refer to [facility] Social Worker for assisted living facility placement.      Plan: RNCM will refer patient to [facility] Worker for assisted living facility placement.  RNCM will send patient successful outreach letter, [facility] pamphlet, and magnet, per patient's request.    Daniyah Fohl H. Annia Friendly, BSN, [facility] Telephonic CM Phone: [phone] Fax: [phone]

## 2020-02-07 DIAGNOSIS — R69 Illness, unspecified: Secondary | ICD-10-CM | POA: Diagnosis not present

## 2020-02-21 ENCOUNTER — Encounter: Payer: Self-pay | Admitting: Family Medicine

## 2020-02-21 ENCOUNTER — Other Ambulatory Visit: Payer: Self-pay

## 2020-02-21 ENCOUNTER — Ambulatory Visit (INDEPENDENT_AMBULATORY_CARE_PROVIDER_SITE_OTHER): Payer: Medicare HMO | Admitting: Family Medicine

## 2020-02-21 VITALS — BP 146/80 | HR 96 | Temp 96.9°F | Ht 62.0 in | Wt 144.1 lb

## 2020-02-21 DIAGNOSIS — R531 Weakness: Secondary | ICD-10-CM | POA: Diagnosis not present

## 2020-02-21 DIAGNOSIS — M542 Cervicalgia: Secondary | ICD-10-CM | POA: Diagnosis not present

## 2020-02-21 NOTE — Assessment & Plan Note (Signed)
Adding to balance issues and fall risk Uses walker full time-but more issues with strength hanging on to it  Ref made for [facility] PT (she is homebound)

## 2020-02-21 NOTE — Patient Instructions (Addendum)
You can use heat on your shoulder area and ice higher up on neck  Adjust your pillow if needed for better support  I do recommend a vaccine for covid   Continue asper creme  Use tramadol only as needed for severe pain  Tylenol is ok  I will refer you for home physical therapy to work on the neck pain and also generalized weakness/deconditioning    Our office will call you about this   If pain worsens let us know
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
## 2020-02-21 NOTE — Assessment & Plan Note (Signed)
This has failed to improve much from her fall  Rev records from that time incl CT report of CS and TS  Deg changes/spondylosis noted No new neurol symptoms  Disc use of more supportive pillow if needed  Heat is ok for 10 min prn  Also asper cream Reserves tramadol for severe pain  Watching for neuro symptoms Will ref to PT (home health since homebound)

## 2020-02-26 DIAGNOSIS — M47812 Spondylosis without myelopathy or radiculopathy, cervical region: Secondary | ICD-10-CM | POA: Diagnosis not present

## 2020-02-26 DIAGNOSIS — M179 Osteoarthritis of knee, unspecified: Secondary | ICD-10-CM | POA: Diagnosis not present

## 2020-02-26 DIAGNOSIS — E538 Deficiency of other specified B group vitamins: Secondary | ICD-10-CM | POA: Diagnosis not present

## 2020-02-26 DIAGNOSIS — M8580 Other specified disorders of bone density and structure, unspecified site: Secondary | ICD-10-CM | POA: Diagnosis not present

## 2020-02-26 DIAGNOSIS — R69 Illness, unspecified: Secondary | ICD-10-CM | POA: Diagnosis not present

## 2020-02-26 DIAGNOSIS — I429 Cardiomyopathy, unspecified: Secondary | ICD-10-CM | POA: Diagnosis not present

## 2020-02-26 DIAGNOSIS — D649 Anemia, unspecified: Secondary | ICD-10-CM | POA: Diagnosis not present

## 2020-02-26 DIAGNOSIS — M169 Osteoarthritis of hip, unspecified: Secondary | ICD-10-CM | POA: Diagnosis not present

## 2020-02-26 DIAGNOSIS — I872 Venous insufficiency (chronic) (peripheral): Secondary | ICD-10-CM | POA: Diagnosis not present

## 2020-02-26 DIAGNOSIS — I10 Essential (primary) hypertension: Secondary | ICD-10-CM | POA: Diagnosis not present

## 2020-02-29 DIAGNOSIS — J309 Allergic rhinitis, unspecified: Secondary | ICD-10-CM

## 2020-02-29 DIAGNOSIS — M47812 Spondylosis without myelopathy or radiculopathy, cervical region: Secondary | ICD-10-CM | POA: Diagnosis not present

## 2020-02-29 DIAGNOSIS — Z853 Personal history of malignant neoplasm of breast: Secondary | ICD-10-CM

## 2020-02-29 DIAGNOSIS — M169 Osteoarthritis of hip, unspecified: Secondary | ICD-10-CM | POA: Diagnosis not present

## 2020-02-29 DIAGNOSIS — Z9181 History of falling: Secondary | ICD-10-CM

## 2020-02-29 DIAGNOSIS — R69 Illness, unspecified: Secondary | ICD-10-CM | POA: Diagnosis not present

## 2020-02-29 DIAGNOSIS — I872 Venous insufficiency (chronic) (peripheral): Secondary | ICD-10-CM

## 2020-02-29 DIAGNOSIS — Z7982 Long term (current) use of aspirin: Secondary | ICD-10-CM

## 2020-02-29 DIAGNOSIS — D649 Anemia, unspecified: Secondary | ICD-10-CM

## 2020-02-29 DIAGNOSIS — I10 Essential (primary) hypertension: Secondary | ICD-10-CM | POA: Diagnosis not present

## 2020-02-29 DIAGNOSIS — Z87440 Personal history of urinary (tract) infections: Secondary | ICD-10-CM

## 2020-02-29 DIAGNOSIS — Z8541 Personal history of malignant neoplasm of cervix uteri: Secondary | ICD-10-CM

## 2020-02-29 DIAGNOSIS — E538 Deficiency of other specified B group vitamins: Secondary | ICD-10-CM

## 2020-02-29 DIAGNOSIS — Z87891 Personal history of nicotine dependence: Secondary | ICD-10-CM

## 2020-02-29 DIAGNOSIS — E782 Mixed hyperlipidemia: Secondary | ICD-10-CM

## 2020-02-29 DIAGNOSIS — M179 Osteoarthritis of knee, unspecified: Secondary | ICD-10-CM | POA: Diagnosis not present

## 2020-02-29 DIAGNOSIS — F419 Anxiety disorder, unspecified: Secondary | ICD-10-CM

## 2020-02-29 DIAGNOSIS — M8580 Other specified disorders of bone density and structure, unspecified site: Secondary | ICD-10-CM

## 2020-02-29 DIAGNOSIS — E1169 Type 2 diabetes mellitus with other specified complication: Secondary | ICD-10-CM

## 2020-02-29 DIAGNOSIS — I429 Cardiomyopathy, unspecified: Secondary | ICD-10-CM

## 2020-02-29 DIAGNOSIS — K219 Gastro-esophageal reflux disease without esophagitis: Secondary | ICD-10-CM

## 2020-02-29 DIAGNOSIS — Z7984 Long term (current) use of oral hypoglycemic drugs: Secondary | ICD-10-CM

## 2020-03-03 DIAGNOSIS — I10 Essential (primary) hypertension: Secondary | ICD-10-CM | POA: Diagnosis not present

## 2020-03-03 DIAGNOSIS — E538 Deficiency of other specified B group vitamins: Secondary | ICD-10-CM | POA: Diagnosis not present

## 2020-03-03 DIAGNOSIS — M47812 Spondylosis without myelopathy or radiculopathy, cervical region: Secondary | ICD-10-CM | POA: Diagnosis not present

## 2020-03-03 DIAGNOSIS — M169 Osteoarthritis of hip, unspecified: Secondary | ICD-10-CM | POA: Diagnosis not present

## 2020-03-03 DIAGNOSIS — R69 Illness, unspecified: Secondary | ICD-10-CM | POA: Diagnosis not present

## 2020-03-03 DIAGNOSIS — I872 Venous insufficiency (chronic) (peripheral): Secondary | ICD-10-CM | POA: Diagnosis not present

## 2020-03-03 DIAGNOSIS — I429 Cardiomyopathy, unspecified: Secondary | ICD-10-CM | POA: Diagnosis not present

## 2020-03-03 DIAGNOSIS — D649 Anemia, unspecified: Secondary | ICD-10-CM | POA: Diagnosis not present

## 2020-03-03 DIAGNOSIS — M179 Osteoarthritis of knee, unspecified: Secondary | ICD-10-CM | POA: Diagnosis not present

## 2020-03-03 DIAGNOSIS — M8580 Other specified disorders of bone density and structure, unspecified site: Secondary | ICD-10-CM | POA: Diagnosis not present

## 2020-03-08 DIAGNOSIS — E538 Deficiency of other specified B group vitamins: Secondary | ICD-10-CM | POA: Diagnosis not present

## 2020-03-08 DIAGNOSIS — M47812 Spondylosis without myelopathy or radiculopathy, cervical region: Secondary | ICD-10-CM | POA: Diagnosis not present

## 2020-03-08 DIAGNOSIS — D649 Anemia, unspecified: Secondary | ICD-10-CM | POA: Diagnosis not present

## 2020-03-08 DIAGNOSIS — M179 Osteoarthritis of knee, unspecified: Secondary | ICD-10-CM | POA: Diagnosis not present

## 2020-03-08 DIAGNOSIS — M8580 Other specified disorders of bone density and structure, unspecified site: Secondary | ICD-10-CM | POA: Diagnosis not present

## 2020-03-08 DIAGNOSIS — M169 Osteoarthritis of hip, unspecified: Secondary | ICD-10-CM | POA: Diagnosis not present

## 2020-03-08 DIAGNOSIS — I10 Essential (primary) hypertension: Secondary | ICD-10-CM | POA: Diagnosis not present

## 2020-03-08 DIAGNOSIS — R69 Illness, unspecified: Secondary | ICD-10-CM | POA: Diagnosis not present

## 2020-03-08 DIAGNOSIS — I429 Cardiomyopathy, unspecified: Secondary | ICD-10-CM | POA: Diagnosis not present

## 2020-03-08 DIAGNOSIS — I872 Venous insufficiency (chronic) (peripheral): Secondary | ICD-10-CM | POA: Diagnosis not present

## 2020-03-14 ENCOUNTER — Ambulatory Visit (INDEPENDENT_AMBULATORY_CARE_PROVIDER_SITE_OTHER): Payer: Medicare HMO | Admitting: Family Medicine

## 2020-03-14 ENCOUNTER — Encounter: Payer: Self-pay | Admitting: Family Medicine

## 2020-03-14 ENCOUNTER — Other Ambulatory Visit: Payer: Self-pay

## 2020-03-14 VITALS — BP 146/78 | HR 90 | Temp 97.2°F | Ht 62.0 in | Wt 145.1 lb

## 2020-03-14 DIAGNOSIS — N3946 Mixed incontinence: Secondary | ICD-10-CM

## 2020-03-14 DIAGNOSIS — R300 Dysuria: Secondary | ICD-10-CM

## 2020-03-14 DIAGNOSIS — E1165 Type 2 diabetes mellitus with hyperglycemia: Secondary | ICD-10-CM | POA: Diagnosis not present

## 2020-03-14 LAB — POC URINALSYSI DIPSTICK (AUTOMATED)
Bilirubin, UA: NEGATIVE
Blood, UA: 25
Glucose, UA: POSITIVE — AB
Leukocytes, UA: NEGATIVE
Nitrite, UA: NEGATIVE
Protein, UA: POSITIVE — AB
Spec Grav, UA: >=1.03 — AB (ref 1.010–1.025)
Urobilinogen, UA: 0.2 E.U./dL
pH, UA: 5.5 (ref 5.0–8.0)

## 2020-03-14 NOTE — Assessment & Plan Note (Signed)
Ongoing Helped by pessary (pt sees Dr Kenton Kingfisher for gyn) and estrogen cream

## 2020-03-14 NOTE — Assessment & Plan Note (Signed)
Urine is concentrated with glucose (may be reason for frequency-poor diabetic control) as well as rbc and protein  Complex history of urinary symptoms -has pessary and uses estrogen cream as well  Urine sent for culture Urged strongly to inc water intake (family will help with this)  Also again asked pt to consider addnl DM med to control blood sugar (she declined)

## 2020-03-14 NOTE — Patient Instructions (Signed)
Some of your urinary frequency is from poorly controlled diabetes  You may need to drink much more water to keep urine dilute   If you change your mind about additional diabetes treatments let us know   I will culture urine to see if there is infection [facility]  We will call you with that result   In the meantime if symptoms change/please let us know

## 2020-03-14 NOTE — Assessment & Plan Note (Signed)
Frequent urination with glucose in urine  Pt again declines additional tx to help glucose control  D/w her grand daughter who stays wit her now as well
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
## 2020-03-15 LAB — URINE CULTURE
MICRO NUMBER:: 10593134
SPECIMEN QUALITY:: ADEQUATE

## 2020-03-16 DIAGNOSIS — I872 Venous insufficiency (chronic) (peripheral): Secondary | ICD-10-CM | POA: Diagnosis not present

## 2020-03-16 DIAGNOSIS — I10 Essential (primary) hypertension: Secondary | ICD-10-CM | POA: Diagnosis not present

## 2020-03-16 DIAGNOSIS — R69 Illness, unspecified: Secondary | ICD-10-CM | POA: Diagnosis not present

## 2020-03-16 DIAGNOSIS — E538 Deficiency of other specified B group vitamins: Secondary | ICD-10-CM | POA: Diagnosis not present

## 2020-03-16 DIAGNOSIS — I429 Cardiomyopathy, unspecified: Secondary | ICD-10-CM | POA: Diagnosis not present

## 2020-03-16 DIAGNOSIS — M179 Osteoarthritis of knee, unspecified: Secondary | ICD-10-CM | POA: Diagnosis not present

## 2020-03-16 DIAGNOSIS — D649 Anemia, unspecified: Secondary | ICD-10-CM | POA: Diagnosis not present

## 2020-03-16 DIAGNOSIS — M8580 Other specified disorders of bone density and structure, unspecified site: Secondary | ICD-10-CM | POA: Diagnosis not present

## 2020-03-16 DIAGNOSIS — M47812 Spondylosis without myelopathy or radiculopathy, cervical region: Secondary | ICD-10-CM | POA: Diagnosis not present

## 2020-03-16 DIAGNOSIS — M169 Osteoarthritis of hip, unspecified: Secondary | ICD-10-CM | POA: Diagnosis not present

## 2020-03-20 ENCOUNTER — Telehealth: Payer: Self-pay | Admitting: *Deleted

## 2020-03-20 DIAGNOSIS — R300 Dysuria: Secondary | ICD-10-CM

## 2020-03-20 DIAGNOSIS — N3946 Mixed incontinence: Secondary | ICD-10-CM

## 2020-03-20 MED ORDER — GLIPIZIDE 10 MG PO TABS: 10.0000 mg | ORAL_TABLET | Freq: Two times a day (BID) | ORAL | 3 refills | Status: DC

## 2020-03-20 NOTE — Telephone Encounter (Signed)
Patient's granddaughter called stating that she forgot to ask Dr. Glori Bickers about her grandmother's medication the other day when she was in. Patient got on the phone and gave permission for me to talk with April. April stated when her grandmother was in a rehab facility they were controlling her blood sugar with insulin and one glipizide. April stated that her grandmother refuses to take the insulin since coming home. April stated that her grandmother has not taken insulin now for about a month. April stated that the order for her grandmother's glipizide has not been increased back to two a day since coming home from rehab. April is wanting to know if Dr. Glori Bickers will increase Laura Bell's glipizide back to two a day because her fasting blood sugar has been running 280 even with her watching her diet. Pharmacy [facility]

## 2020-03-20 NOTE — Telephone Encounter (Signed)
I sent it Please keep me posted re: glucose readings  It she ever becomes open to additional medicine or basal insulin I think that would be helpful  She has declined so far

## 2020-03-21 NOTE — Telephone Encounter (Signed)
Referral done

## 2020-03-21 NOTE — Telephone Encounter (Signed)
Spoke with Granddaughter notified of Dr. Marliss Coots comments.   Granddaughter did want to ask Dr. Glori Bickers to put a referral in for pt to see urology. Notes on last urine cx said pt will call urologist to set up f/u appt but when Granddaughter tried to call them they said it's been to long and pt will need new referral. Pt got to [facility] and they would like a new referral to that office

## 2020-03-22 DIAGNOSIS — M179 Osteoarthritis of knee, unspecified: Secondary | ICD-10-CM | POA: Diagnosis not present

## 2020-03-22 DIAGNOSIS — I872 Venous insufficiency (chronic) (peripheral): Secondary | ICD-10-CM | POA: Diagnosis not present

## 2020-03-22 DIAGNOSIS — M47812 Spondylosis without myelopathy or radiculopathy, cervical region: Secondary | ICD-10-CM | POA: Diagnosis not present

## 2020-03-22 DIAGNOSIS — E538 Deficiency of other specified B group vitamins: Secondary | ICD-10-CM | POA: Diagnosis not present

## 2020-03-22 DIAGNOSIS — M169 Osteoarthritis of hip, unspecified: Secondary | ICD-10-CM | POA: Diagnosis not present

## 2020-03-22 DIAGNOSIS — D649 Anemia, unspecified: Secondary | ICD-10-CM | POA: Diagnosis not present

## 2020-03-22 DIAGNOSIS — I10 Essential (primary) hypertension: Secondary | ICD-10-CM | POA: Diagnosis not present

## 2020-03-22 DIAGNOSIS — M8580 Other specified disorders of bone density and structure, unspecified site: Secondary | ICD-10-CM | POA: Diagnosis not present

## 2020-03-22 DIAGNOSIS — I429 Cardiomyopathy, unspecified: Secondary | ICD-10-CM | POA: Diagnosis not present

## 2020-03-22 DIAGNOSIS — R69 Illness, unspecified: Secondary | ICD-10-CM | POA: Diagnosis not present

## 2020-03-23 ENCOUNTER — Other Ambulatory Visit: Payer: Self-pay | Admitting: Family Medicine

## 2020-03-25 DIAGNOSIS — R69 Illness, unspecified: Secondary | ICD-10-CM | POA: Diagnosis not present

## 2020-03-27 DIAGNOSIS — F419 Anxiety disorder, unspecified: Secondary | ICD-10-CM | POA: Diagnosis not present

## 2020-03-27 DIAGNOSIS — E11620 Type 2 diabetes mellitus with diabetic dermatitis: Secondary | ICD-10-CM | POA: Diagnosis not present

## 2020-03-27 DIAGNOSIS — B379 Candidiasis, unspecified: Secondary | ICD-10-CM | POA: Diagnosis not present

## 2020-03-27 DIAGNOSIS — K219 Gastro-esophageal reflux disease without esophagitis: Secondary | ICD-10-CM | POA: Diagnosis not present

## 2020-03-27 DIAGNOSIS — M1990 Unspecified osteoarthritis, unspecified site: Secondary | ICD-10-CM | POA: Diagnosis not present

## 2020-03-27 DIAGNOSIS — R69 Illness, unspecified: Secondary | ICD-10-CM | POA: Diagnosis not present

## 2020-03-27 DIAGNOSIS — G8929 Other chronic pain: Secondary | ICD-10-CM | POA: Diagnosis not present

## 2020-03-27 DIAGNOSIS — I10 Essential (primary) hypertension: Secondary | ICD-10-CM | POA: Diagnosis not present

## 2020-03-27 DIAGNOSIS — E785 Hyperlipidemia, unspecified: Secondary | ICD-10-CM | POA: Diagnosis not present

## 2020-03-27 DIAGNOSIS — E1165 Type 2 diabetes mellitus with hyperglycemia: Secondary | ICD-10-CM | POA: Diagnosis not present

## 2020-03-29 ENCOUNTER — Telehealth: Payer: Self-pay

## 2020-03-29 DIAGNOSIS — D649 Anemia, unspecified: Secondary | ICD-10-CM | POA: Diagnosis not present

## 2020-03-29 DIAGNOSIS — M179 Osteoarthritis of knee, unspecified: Secondary | ICD-10-CM | POA: Diagnosis not present

## 2020-03-29 DIAGNOSIS — I10 Essential (primary) hypertension: Secondary | ICD-10-CM | POA: Diagnosis not present

## 2020-03-29 DIAGNOSIS — E538 Deficiency of other specified B group vitamins: Secondary | ICD-10-CM | POA: Diagnosis not present

## 2020-03-29 DIAGNOSIS — I872 Venous insufficiency (chronic) (peripheral): Secondary | ICD-10-CM | POA: Diagnosis not present

## 2020-03-29 DIAGNOSIS — M47812 Spondylosis without myelopathy or radiculopathy, cervical region: Secondary | ICD-10-CM | POA: Diagnosis not present

## 2020-03-29 DIAGNOSIS — R69 Illness, unspecified: Secondary | ICD-10-CM | POA: Diagnosis not present

## 2020-03-29 DIAGNOSIS — M8580 Other specified disorders of bone density and structure, unspecified site: Secondary | ICD-10-CM | POA: Diagnosis not present

## 2020-03-29 DIAGNOSIS — M169 Osteoarthritis of hip, unspecified: Secondary | ICD-10-CM | POA: Diagnosis not present

## 2020-03-29 DIAGNOSIS — I429 Cardiomyopathy, unspecified: Secondary | ICD-10-CM | POA: Diagnosis not present

## 2020-03-29 NOTE — Telephone Encounter (Signed)
Horris Latino with [facility] said that Hal Morales pts care giver (DPR signed) said that pt continues with a consistent pain level of 8 in pts neck. Pt has seen doctor since 01/2020 and there has been no improvement with doing everything pt was advised to do at 0524/21 visit; pt using supportive pillow, using heat for 10 ' prn, asper cream, tramadol and HH PT. Horris Latino said the exercises exacerbate pain. Per [facility] notes since pt is not showing progress with pain level pt is to be seen 03/13/20 by [facility] and then the following wk unless something changes pt will be discharged due to ins guidelines of pt not improving since [facility] going out to pt since end of May. [facility] and Hal Morales pts daughter also request cb after reviewed by Dr Glori Bickers and to see if any further suggestions or orders.

## 2020-03-29 NOTE — Telephone Encounter (Signed)
I would like to refer her to orthopedics if agreeable

## 2020-03-31 ENCOUNTER — Other Ambulatory Visit: Payer: Self-pay

## 2020-03-31 ENCOUNTER — Encounter: Payer: Self-pay | Admitting: Obstetrics & Gynecology

## 2020-03-31 ENCOUNTER — Ambulatory Visit (INDEPENDENT_AMBULATORY_CARE_PROVIDER_SITE_OTHER): Payer: Medicare HMO | Admitting: Obstetrics & Gynecology

## 2020-03-31 VITALS — BP 130/80 | Ht 62.0 in | Wt 146.0 lb

## 2020-03-31 DIAGNOSIS — H353131 Nonexudative age-related macular degeneration, bilateral, early dry stage: Secondary | ICD-10-CM | POA: Diagnosis not present

## 2020-03-31 DIAGNOSIS — N8110 Cystocele, unspecified: Secondary | ICD-10-CM

## 2020-03-31 DIAGNOSIS — E119 Type 2 diabetes mellitus without complications: Secondary | ICD-10-CM | POA: Diagnosis not present

## 2020-03-31 DIAGNOSIS — R350 Frequency of micturition: Secondary | ICD-10-CM

## 2020-03-31 DIAGNOSIS — N952 Postmenopausal atrophic vaginitis: Secondary | ICD-10-CM

## 2020-03-31 DIAGNOSIS — N8111 Cystocele, midline: Secondary | ICD-10-CM | POA: Diagnosis not present

## 2020-03-31 DIAGNOSIS — N3001 Acute cystitis with hematuria: Secondary | ICD-10-CM | POA: Diagnosis not present

## 2020-03-31 LAB — HM DIABETES EYE EXAM

## 2020-03-31 LAB — POCT URINALYSIS DIPSTICK
Bilirubin, UA: NEGATIVE
Blood, UA: POSITIVE
Glucose, UA: NEGATIVE
Ketones, UA: NEGATIVE
Nitrite, UA: NEGATIVE
Protein, UA: NEGATIVE
Spec Grav, UA: 1.01 (ref 1.010–1.025)
Urobilinogen, UA: 0.2 E.U./dL
pH, UA: 5 (ref 5.0–8.0)

## 2020-03-31 MED ORDER — PREMARIN 0.625 MG/GM VA CREA: TOPICAL_CREAM | VAGINAL | 3 refills | Status: DC

## 2020-03-31 MED ORDER — CIPROFLOXACIN HCL 500 MG PO TABS: 500.0000 mg | ORAL_TABLET | Freq: Two times a day (BID) | ORAL | 0 refills | Status: DC

## 2020-03-31 NOTE — Progress Notes (Signed)
HPI:      [name] is a 84 y.o. No obstetric history on file. who presents today for her pessary follow up and examination related to her pelvic floor weakening.  Pt reports tolerating the pessary well with occas recent no vaginal bleeding and  no vaginal discharge.  Symptoms of pelvic floor weakening have greatly improved. She is voiding and defecating without difficulty, although recetn reports of pressure and frequency. She currently has a Ring #5 pessary.  PMHx: She  has a past medical history of Arthritis, Bleeding disorder ([place]), Breast cancer ([place]) (10/11/13 dx), Cardiomyopathy ([place]), Cervical cancer ([place]), Demand ischemia ([place]), Full dentures, GERD (gastroesophageal reflux disease), History of stomach ulcers, Hypertension, Mixed hyperlipidemia, Osteoarthritis, Osteopenia, Stress reaction (2009), Type II diabetes mellitus ([place]), Urinary incontinence, Uterus cancer ([place]), and Varicose veins. Also,  has a past surgical history that includes Abdominal hysterectomy (1964); Bladder surgery (1995); Abdominal US (2007); Sclerotherapy varicose veins (5/08); left breast bx (1/15); Breast lumpectomy with needle localization (Left, 11/10/2013); Breast biopsy (Left, 09/2013); Breast biopsy (Left, 12/2014); and Breast lumpectomy (Left, 10/2013)., family history includes Cancer in her brother, brother, father, and sister; Cancer (age of onset: 22) in an other family member; Heart attack in her mother.,  reports that she quit smoking about 24 years ago. Her smoking use included cigarettes. She has a 39.20 pack-year smoking history. She has never used smokeless tobacco. She reports that she does not drink alcohol and does not use drugs.  She has a current medication list which includes the following prescription(s): accu-chek softclix lancets, acetaminophen, aspirin ec, onetouch verio, vitamin d, citalopram, clotrimazole-betamethasone, premarin, cyanocobalamin, famotidine, glipizide, onetouch verio,  lisinopril, metformin, metoprolol succinate, nystatin-triamcinolone ointment, simvastatin, terconazole, and ciprofloxacin. Also, is allergic to actos [pioglitazone], alendronate sodium, boniva [ibandronate sodium], ibandronic acid, lansoprazole, and omeprazole.  Review of Systems  Genitourinary: Positive for frequency.  All other systems reviewed and are negative.   Objective: BP 130/80   Ht 5\' 2"  (1.575 m)   Wt 146 lb (66.2 kg)   BMI 26.70 kg/m  Physical Exam Constitutional:      General: She is not in acute distress.    Appearance: She is well-developed.  Genitourinary:     Pelvic exam was performed with patient supine.     Vagina normal.     No vaginal erythema or bleeding.     Genitourinary Comments: Cuff intact/ no lesions Atrophy noted.  Some bleeding areas seen in vagina. Absent uterus and cervix  HENT:     Head: Normocephalic and atraumatic.     Nose: Nose normal.  Abdominal:     General: There is no distension.     Palpations: Abdomen is soft.     Tenderness: There is no abdominal tenderness.  Musculoskeletal:        General: Normal range of motion.  Neurological:     Mental Status: She is alert and oriented to person, place, and time.     Cranial Nerves: No cranial nerve deficit.  Skin:    General: Skin is warm and dry.  Psychiatric:        Attention and Perception: Attention normal.        Mood and Affect: Mood normal.        Speech: Speech normal.        Behavior: Behavior normal.        Cognition and Memory: Cognition normal.        Judgment: Judgment normal.    Results for orders placed  or performed in visit on 03/31/20  POCT urinalysis dipstick  Result Value Ref Range   Color, UA     Clarity, UA     Glucose, UA Negative Negative   Bilirubin, UA neg    Ketones, UA neg    Spec Grav, UA 1.010 1.010 - 1.025   Blood, UA positive    pH, UA 5.0 5.0 - 8.0   Protein, UA Negative Negative   Urobilinogen, UA 0.2 0.2 or 1.0 E.U./dL   Nitrite, UA neg     Leukocytes, UA Small (1+) (A) Negative   Appearance     Odor      Pessary Care Pessary removed and cleaned.  Vagina checked - without erosions but some bleeding surfaces - pessary not replaced.  A/P:   ICD-10-CM   1. Urine frequency  R35.0 POCT urinalysis dipstick  2. Vaginal prolapse  N81.10   3. Cystocele, midline  N81.11   4. Vaginal atrophy  N95.2 conjugated estrogens (PREMARIN) vaginal cream  5. Acute cystitis with hematuria  N30.01 ciprofloxacin (CIPRO) 500 MG tablet   Pessary holiday advised.  Will leave out and follow symptoms closely.  Replace in 4 weeks.  Restart Premarin vag cream and get new Rx to ensure it is of right potency (3 x weekly)  UTI dx by UA, tx w Cipro  See urology as well next week for additional evaluation  A total of 20 minutes were spent face-to-face with the patient as well as preparation, review, communication, and documentation during this encounter.   Barnett Applebaum, MD, [facility], [facility] 03/31/2020  2:34 PM

## 2020-03-31 NOTE — Patient Instructions (Signed)
Urinary Tract Infection, Adult A urinary tract infection (UTI) is an infection of any part of the urinary tract. The urinary tract includes:  The kidneys.  The ureters.  The bladder.  The urethra. These organs make, store, and get rid of pee (urine) in the body. What are the causes? This is caused by germs (bacteria) in your genital area. These germs grow and cause swelling (inflammation) of your urinary tract. What increases the risk? You are more likely to develop this condition if:  You have a small, thin tube (catheter) to drain pee.  You cannot control when you pee or poop (incontinence).  You are female, and: ? You use these methods to prevent pregnancy:  A medicine that kills sperm (spermicide).  A device that blocks sperm (diaphragm). ? You have low levels of a female hormone (estrogen). ? You are pregnant.  You have genes that add to your risk.  You are sexually active.  You take antibiotic medicines.  You have trouble peeing because of: ? A prostate that is bigger than normal, if you are female. ? A blockage in the part of your body that drains pee from the bladder (urethra). ? A kidney stone. ? A nerve condition that affects your bladder (neurogenic bladder). ? Not getting enough to drink. ? Not peeing often enough.  You have other conditions, such as: ? Diabetes. ? A weak disease-fighting system (immune system). ? Sickle cell disease. ? Gout. ? Injury of the spine. What are the signs or symptoms? Symptoms of this condition include:  Needing to pee right away (urgently).  Peeing often.  Peeing small amounts often.  Pain or burning when peeing.  Blood in the pee.  Pee that smells bad or not like normal.  Trouble peeing.  Pee that is cloudy.  Fluid coming from the vagina, if you are female.  Pain in the belly or lower back. Other symptoms include:  Throwing up (vomiting).  No urge to eat.  Feeling mixed up (confused).  Being tired  and grouchy (irritable).  A fever.  Watery poop (diarrhea). How is this treated? This condition may be treated with:  Antibiotic medicine.  Other medicines.  Drinking enough water. Follow these instructions at home:  Medicines  Take over-the-counter and prescription medicines only as told by your doctor.  If you were prescribed an antibiotic medicine, take it as told by your doctor. Do not stop taking it even if you start to feel better. General instructions  Make sure you: ? Pee until your bladder is empty. ? Do not hold pee for a long time. ? Empty your bladder after sex. ? Wipe from front to back after pooping if you are a female. Use each tissue one time when you wipe.  Drink enough fluid to keep your pee pale yellow.  Keep all follow-up visits as told by your doctor. This is important. Contact a doctor if:  You do not get better after 1-2 days.  Your symptoms go away and then come back. Get help right away if:  You have very bad back pain.  You have very bad pain in your lower belly.  You have a fever.  You are sick to your stomach (nauseous).  You are throwing up. Summary  A urinary tract infection (UTI) is an infection of any part of the urinary tract.  This condition is caused by germs in your genital area.  There are many risk factors for a UTI. These include having a small, thin   tube to drain pee and not being able to control when you pee or poop.  Treatment includes antibiotic medicines for germs.  Drink enough fluid to keep your pee pale yellow. This information is not intended to replace advice given to you by your health care provider. Make sure you discuss any questions you have with your health care provider. Document Revised: 09/03/2018 Document Reviewed: 03/26/2018 Elsevier Patient Education  2020 Elsevier Inc.  

## 2020-04-04 DIAGNOSIS — I429 Cardiomyopathy, unspecified: Secondary | ICD-10-CM | POA: Diagnosis not present

## 2020-04-04 DIAGNOSIS — M169 Osteoarthritis of hip, unspecified: Secondary | ICD-10-CM | POA: Diagnosis not present

## 2020-04-04 DIAGNOSIS — M179 Osteoarthritis of knee, unspecified: Secondary | ICD-10-CM | POA: Diagnosis not present

## 2020-04-04 DIAGNOSIS — R69 Illness, unspecified: Secondary | ICD-10-CM | POA: Diagnosis not present

## 2020-04-04 DIAGNOSIS — D649 Anemia, unspecified: Secondary | ICD-10-CM | POA: Diagnosis not present

## 2020-04-04 DIAGNOSIS — M8580 Other specified disorders of bone density and structure, unspecified site: Secondary | ICD-10-CM | POA: Diagnosis not present

## 2020-04-04 DIAGNOSIS — M47812 Spondylosis without myelopathy or radiculopathy, cervical region: Secondary | ICD-10-CM | POA: Diagnosis not present

## 2020-04-04 DIAGNOSIS — E538 Deficiency of other specified B group vitamins: Secondary | ICD-10-CM | POA: Diagnosis not present

## 2020-04-04 DIAGNOSIS — I872 Venous insufficiency (chronic) (peripheral): Secondary | ICD-10-CM | POA: Diagnosis not present

## 2020-04-04 DIAGNOSIS — I10 Essential (primary) hypertension: Secondary | ICD-10-CM | POA: Diagnosis not present

## 2020-04-04 MED ORDER — METHOCARBAMOL 500 MG PO TABS: 500.0000 mg | ORAL_TABLET | Freq: Three times a day (TID) | ORAL | 1 refills | Status: DC | PRN

## 2020-04-04 NOTE — Telephone Encounter (Signed)
Spoke to daughter and she said they have already tried diclofenac OTC, she has also tried Aspercreme, [place] and icey hot and nothing has helped the pain, pt has also tried OTC tylenol and ibuprofen and nothing is helping. Daughter said that tramadol caused diarrhea so they don't want to try that but they are requesting something to help pt with pain while she is going through multiple appts with urologist, GYN and oncologist. Like prev stated daughter said she just has so many specialist appts right now it would be very hard to add another one (ortho) so they are really hoping Dr. Glori Bickers can prescribed something to help

## 2020-04-04 NOTE — Telephone Encounter (Signed)
She could try otc diclofenac gel up to four times daily as needed to the affected area

## 2020-04-04 NOTE — Telephone Encounter (Signed)
Left VM requesting Laura Bell to call the office back

## 2020-04-04 NOTE — Telephone Encounter (Signed)
Daughter notified of Dr. Marliss Coots comments and verbalized understanding. Pt has someone with her at night so they will keep a close eye on her. Rx sent to pharmacy

## 2020-04-04 NOTE — Telephone Encounter (Signed)
Options are limited given age and fall risk   I pended a px for methocarbamol (a muscle relaxer that is less sedating than most) but still use caution for sedation/falls as any other pain medicine  Please send to pharmacy of choice and keep me posted

## 2020-04-04 NOTE — Telephone Encounter (Signed)
Patient denies a referral at this time - she states she has so many appts and she does not want anymore at this time. She is wondering if there is anything else Dr. Glori Bickers can recommend? She states if you can't reach her, to please call her daughter April at [phone]

## 2020-04-07 ENCOUNTER — Encounter: Payer: Self-pay | Admitting: Urology

## 2020-04-07 ENCOUNTER — Other Ambulatory Visit: Payer: Self-pay

## 2020-04-07 ENCOUNTER — Ambulatory Visit (INDEPENDENT_AMBULATORY_CARE_PROVIDER_SITE_OTHER): Payer: Medicare HMO | Admitting: Urology

## 2020-04-07 VITALS — BP 148/86 | HR 109 | Ht 62.0 in | Wt 146.0 lb

## 2020-04-07 DIAGNOSIS — R350 Frequency of micturition: Secondary | ICD-10-CM

## 2020-04-07 DIAGNOSIS — N390 Urinary tract infection, site not specified: Secondary | ICD-10-CM

## 2020-04-07 DIAGNOSIS — N3941 Urge incontinence: Secondary | ICD-10-CM | POA: Diagnosis not present

## 2020-04-07 LAB — BLADDER SCAN AMB NON-IMAGING

## 2020-04-07 MED ORDER — MIRABEGRON ER 25 MG PO TB24: 25.0000 mg | ORAL_TABLET | Freq: Every day | ORAL | 3 refills | Status: DC

## 2020-04-07 NOTE — Patient Instructions (Signed)
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
## 2020-04-09 ENCOUNTER — Other Ambulatory Visit: Payer: Self-pay | Admitting: Family Medicine

## 2020-04-10 IMAGING — CT CT ABDOMEN AND PELVIS WITH CONTRAST
2 of 5 series · 16 of 46 positions shown, 18 images · IV contrast (APPLIED)
Comparison: CT of the abdomen pelvis dated 08/19/2018

CLINICAL DATA: 82-year-old female with pelvic pain. Lower abdominal
pain and dysuria.

EXAM:
CT ABDOMEN AND PELVIS WITH CONTRAST
TECHNIQUE: Multidetector CT imaging of the abdomen and pelvis was performed
using the standard protocol following bolus administration of
intravenous contrast.
CONTRAST:  100mL OMNIPAQUE IOHEXOL 300 MG/ML  SOLN

[Series 2: routine abd/pel with · axial · 0.75mm/px · z∈[-917,-567]mm · 13 of 80 slices shown, 15 images]
[im 5/80  soft-tissue]
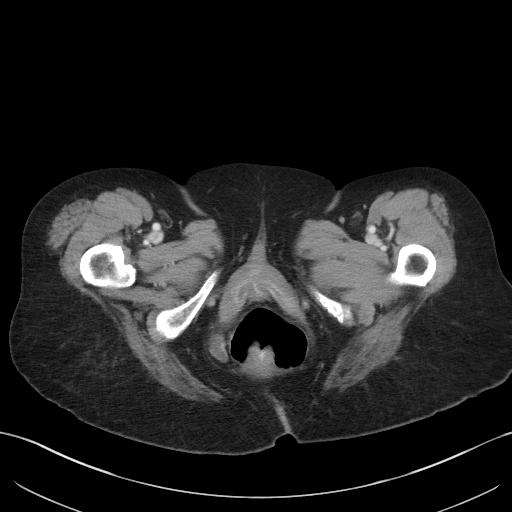
[im 5/80  bone]
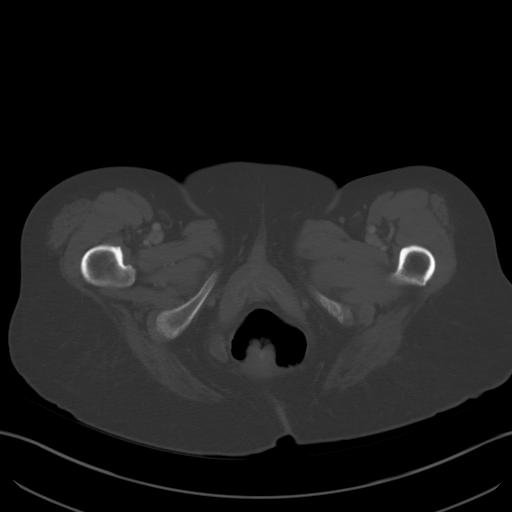
[im 9/80  soft-tissue]
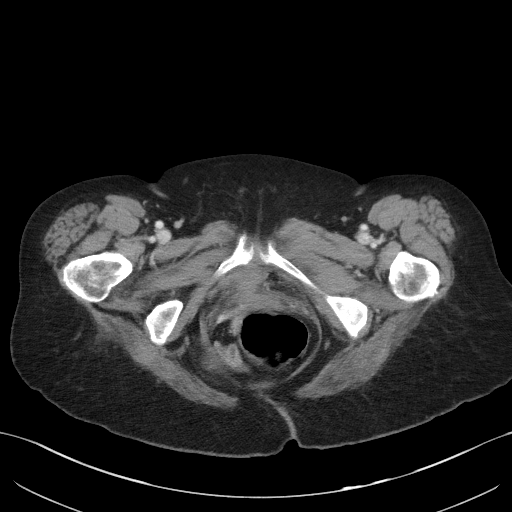
[im 18/80  soft-tissue]
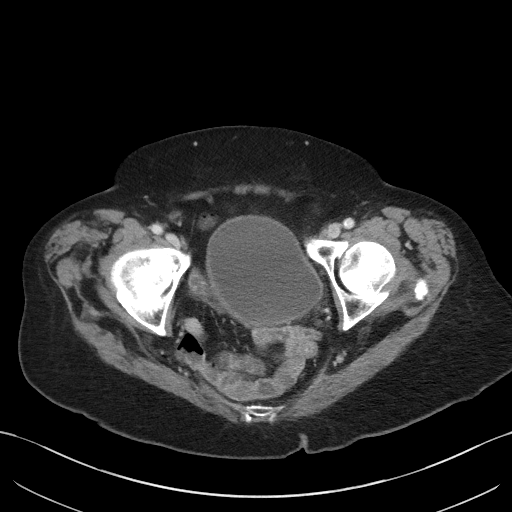
[im 22/80  soft-tissue]
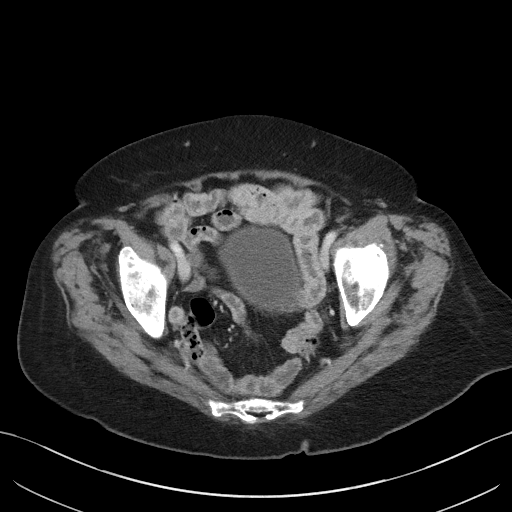
[im 27/80  soft-tissue]
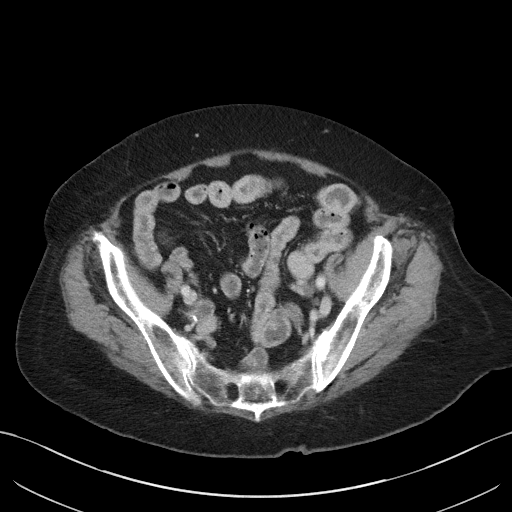
[im 36/80  soft-tissue]
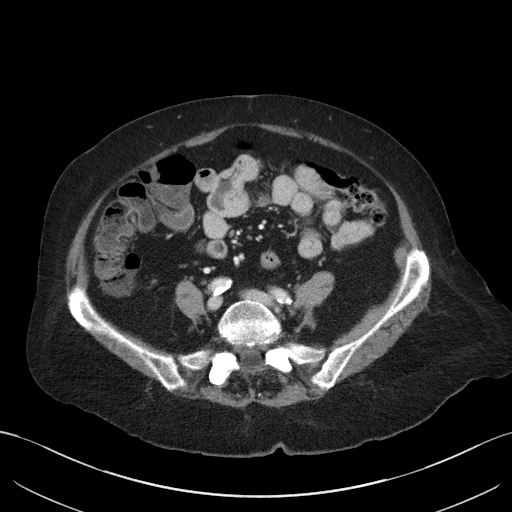
[im 40/80  soft-tissue]
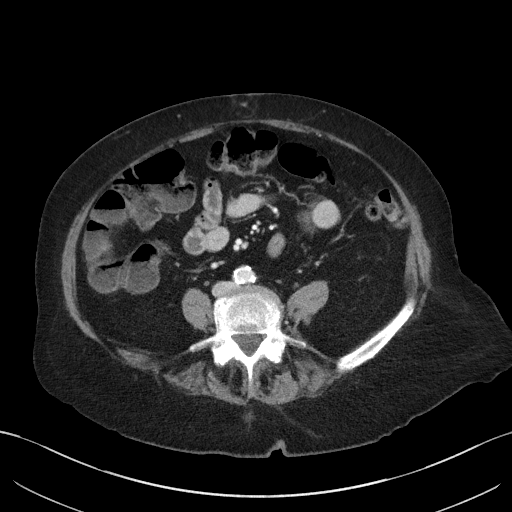
[im 44/80  soft-tissue]
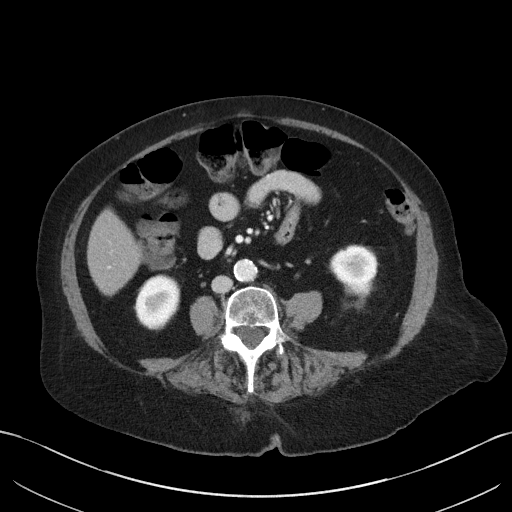
[im 53/80  soft-tissue]
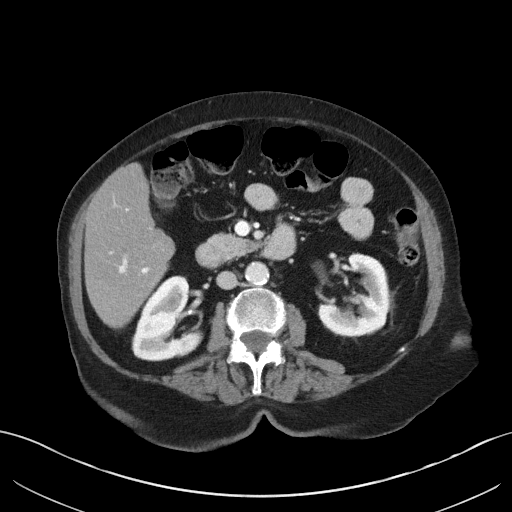
[im 53/80  bone]
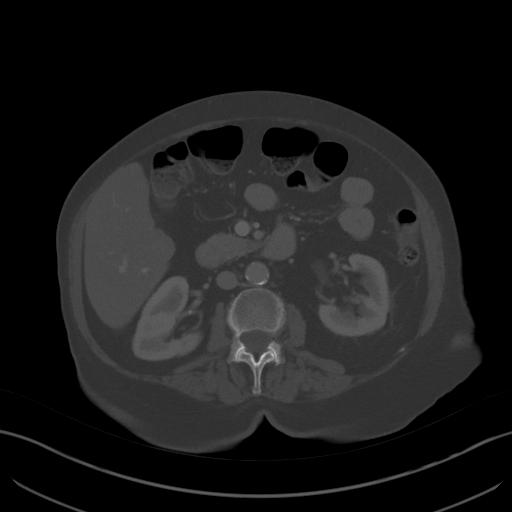
[im 58/80  soft-tissue]
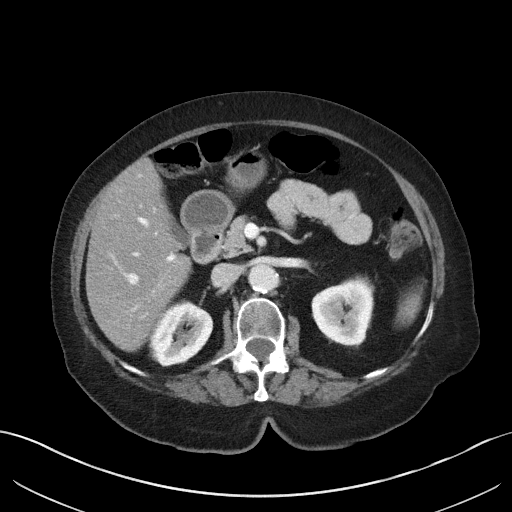
[im 62/80  soft-tissue]
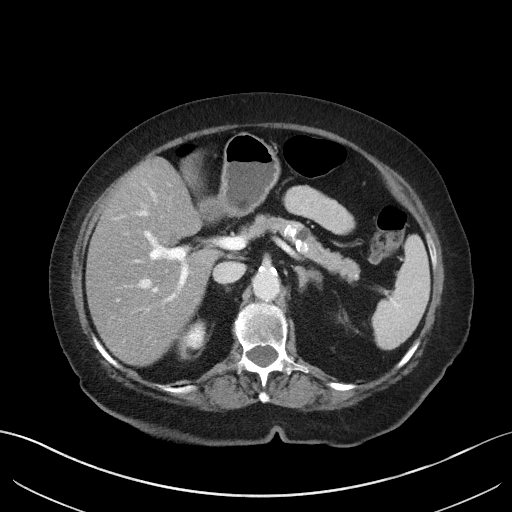
[im 71/80  soft-tissue]
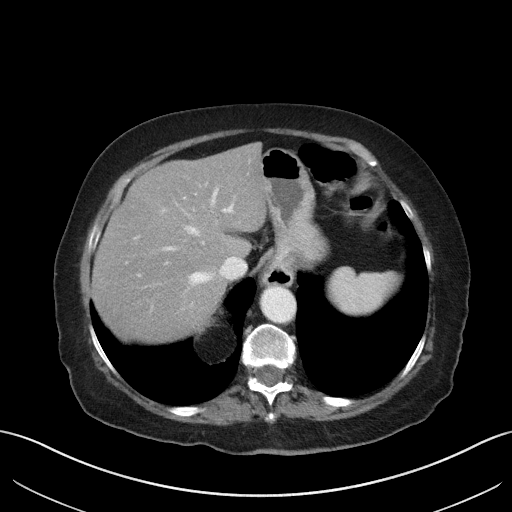
[im 75/80  soft-tissue]
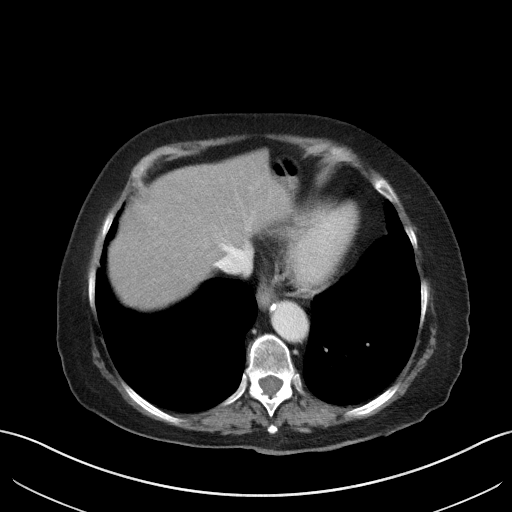

[Series 5: coronal st · coronal · 0.67mm/px · 3 of 89 slices shown]
[im 30/89  soft-tissue]
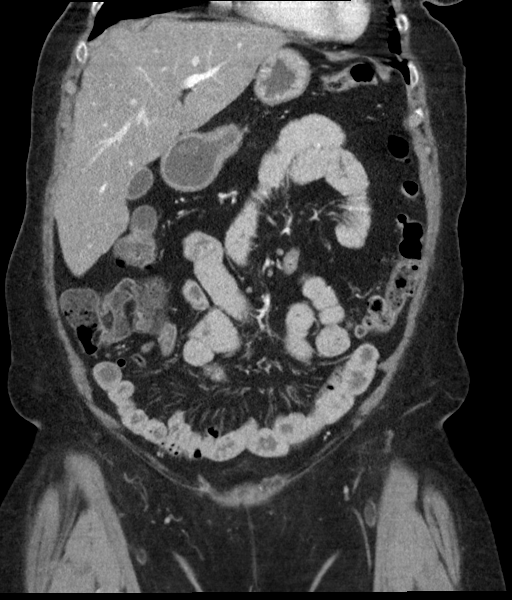
[im 40/89  soft-tissue]
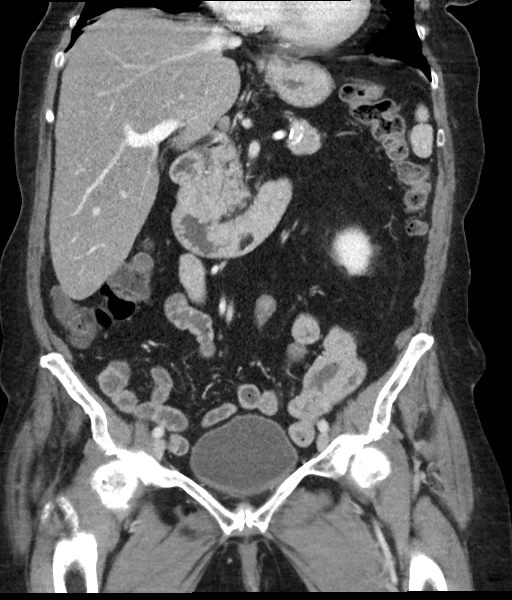
[im 49/89  soft-tissue]
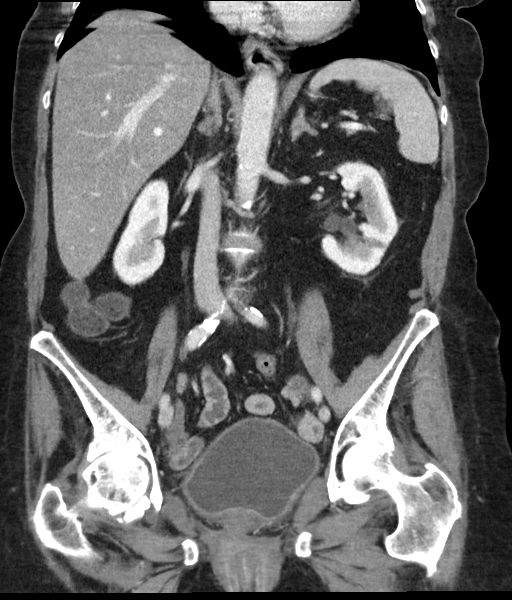

[16 of 46 positions shown; findings below may reference images not displayed]

FINDINGS: Lower chest: The visualized lung bases are clear.

No intra-abdominal free air or free fluid.

Hepatobiliary: Patchy areas of lower density within the liver
represent fatty infiltration. No intrahepatic biliary ductal
dilatation. Subcentimeter right hepatic hypodense focus is too small
to characterize. The gallbladder is unremarkable.

Pancreas: There is a 10 mm hypodense focus in the distal body of the
pancreas without significant interval change since the prior CT. No
peripancreatic stranding or active inflammation. No dilatation of
the main pancreatic duct. Overall the appearance of the pancreas is
similar to the prior CT.

Spleen: Normal in size without focal abnormality.

Adrenals/Urinary Tract: The adrenal glands are unremarkable. There
is no hydronephrosis on either side. There is symmetric enhancement
and excretion of contrast by both kidneys. A 1 cm hypodense focus in
the inferior pole of the left kidney is not well characterized but
may represent a small cyst or an area of scarring. The visualized
ureters appear unremarkable. There is mild diffuse thickening and
haziness of the bladder wall concerning for cystitis. Correlation
with urinalysis recommended.

Stomach/Bowel: There is a small hiatal hernia. There is sigmoid
diverticulosis without active inflammatory changes. There is no
bowel obstruction. The appendix is normal.

Vascular/Lymphatic: Moderate aortoiliac atherosclerotic disease. The
IVC is unremarkable. No portal venous gas. There is no adenopathy.

Reproductive: Hysterectomy. A 1.5 cm ovoid low attenuating structure
in the left hemipelvis (series 2, image 52) appears similar to prior
CT and may represent the patient's ovary.

Other: Small fat containing umbilical hernia.

Musculoskeletal: Degenerative changes of the right hip. No acute
osseous pathology.
IMPRESSION: 1. Mild thickened appearance of the bladder wall concerning for
cystitis. Correlation with urinalysis recommended.
2. Sigmoid diverticulosis. No bowel obstruction or active
inflammation. Normal appendix.
3. Fatty liver.

## 2020-04-11 DIAGNOSIS — R69 Illness, unspecified: Secondary | ICD-10-CM | POA: Diagnosis not present

## 2020-04-11 DIAGNOSIS — D649 Anemia, unspecified: Secondary | ICD-10-CM | POA: Diagnosis not present

## 2020-04-11 DIAGNOSIS — I429 Cardiomyopathy, unspecified: Secondary | ICD-10-CM | POA: Diagnosis not present

## 2020-04-11 DIAGNOSIS — E538 Deficiency of other specified B group vitamins: Secondary | ICD-10-CM | POA: Diagnosis not present

## 2020-04-11 DIAGNOSIS — I10 Essential (primary) hypertension: Secondary | ICD-10-CM | POA: Diagnosis not present

## 2020-04-11 DIAGNOSIS — M8580 Other specified disorders of bone density and structure, unspecified site: Secondary | ICD-10-CM | POA: Diagnosis not present

## 2020-04-11 DIAGNOSIS — M179 Osteoarthritis of knee, unspecified: Secondary | ICD-10-CM | POA: Diagnosis not present

## 2020-04-11 DIAGNOSIS — M169 Osteoarthritis of hip, unspecified: Secondary | ICD-10-CM | POA: Diagnosis not present

## 2020-04-11 DIAGNOSIS — I872 Venous insufficiency (chronic) (peripheral): Secondary | ICD-10-CM | POA: Diagnosis not present

## 2020-04-11 DIAGNOSIS — M47812 Spondylosis without myelopathy or radiculopathy, cervical region: Secondary | ICD-10-CM | POA: Diagnosis not present

## 2020-04-12 ENCOUNTER — Encounter: Payer: Self-pay | Admitting: Family Medicine
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
## 2020-04-21 ENCOUNTER — Encounter: Payer: Self-pay | Admitting: Urology

## 2020-04-21 ENCOUNTER — Ambulatory Visit: Payer: Medicare HMO | Admitting: Urology

## 2020-04-21 ENCOUNTER — Other Ambulatory Visit: Payer: Self-pay

## 2020-04-21 VITALS — BP 169/93 | HR 96 | Ht 62.0 in | Wt 145.0 lb

## 2020-04-21 DIAGNOSIS — N952 Postmenopausal atrophic vaginitis: Secondary | ICD-10-CM

## 2020-04-21 DIAGNOSIS — N3941 Urge incontinence: Secondary | ICD-10-CM

## 2020-04-21 DIAGNOSIS — N390 Urinary tract infection, site not specified: Secondary | ICD-10-CM | POA: Diagnosis not present

## 2020-04-21 LAB — BLADDER SCAN AMB NON-IMAGING: Scan Result: 56

## 2020-04-21 MED ORDER — GEMTESA 75 MG PO TABS: 75.0000 mg | ORAL_TABLET | Freq: Every day | ORAL | 0 refills | Status: DC

## 2020-04-25 ENCOUNTER — Other Ambulatory Visit: Payer: Self-pay | Admitting: Oncology

## 2020-05-01 ENCOUNTER — Telehealth: Payer: Self-pay | Admitting: Oncology

## 2020-05-01 NOTE — Telephone Encounter (Signed)
Scheduled appts per provider PAL request and print out with hand written instructions. Left voicemail on daughter's phone with cancellation and new appt details.

## 2020-05-02 ENCOUNTER — Ambulatory Visit: Payer: Medicare HMO | Admitting: Obstetrics & Gynecology
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
## 2020-05-10 ENCOUNTER — Ambulatory Visit: Payer: Self-pay | Admitting: Urology

## 2020-05-14 ENCOUNTER — Other Ambulatory Visit: Payer: Self-pay

## 2020-05-14 ENCOUNTER — Encounter: Payer: Self-pay | Admitting: Emergency Medicine

## 2020-05-14 ENCOUNTER — Emergency Department
Admission: EM | Admit: 2020-05-14 | Discharge: 2020-05-14 | Disposition: A | Discharge status: Home / Self Care | Payer: Medicare HMO | Attending: Emergency Medicine | Admitting: Emergency Medicine

## 2020-05-14 DIAGNOSIS — Z5321 Procedure and treatment not carried out due to patient leaving prior to being seen by health care provider: Secondary | ICD-10-CM | POA: Diagnosis not present

## 2020-05-14 DIAGNOSIS — R300 Dysuria: Secondary | ICD-10-CM | POA: Diagnosis present

## 2020-05-14 LAB — CBC
HCT: 37.5 % (ref 36.0–46.0)
Hemoglobin: 11.8 g/dL — ABNORMAL LOW (ref 12.0–15.0)
MCH: 26.5 pg (ref 26.0–34.0)
MCHC: 31.5 g/dL (ref 30.0–36.0)
MCV: 84.1 fL (ref 80.0–100.0)
Platelets: 269 10*3/uL (ref 150–400)
RBC: 4.46 MIL/uL (ref 3.87–5.11)
RDW: 14.3 % (ref 11.5–15.5)
WBC: 7.1 10*3/uL (ref 4.0–10.5)
nRBC: 0 % (ref 0.0–0.2)

## 2020-05-14 LAB — BASIC METABOLIC PANEL
Anion gap: 7 (ref 5–15)
BUN: 10 mg/dL (ref 8–23)
CO2: 27 mmol/L (ref 22–32)
Calcium: 9.5 mg/dL (ref 8.9–10.3)
Chloride: 98 mmol/L (ref 98–111)
Creatinine, Ser: 0.57 mg/dL (ref 0.44–1.00)
GFR calc Af Amer: >60 mL/min (ref >60)
GFR calc non Af Amer: >60 mL/min (ref >60)
Glucose, Bld: 471 mg/dL — ABNORMAL HIGH (ref 70–99)
Potassium: 4.2 mmol/L (ref 3.5–5.1)
Sodium: 132 mmol/L — ABNORMAL LOW (ref 135–145)

## 2020-05-14 LAB — LACTIC ACID, PLASMA: Lactic Acid, Venous: 1.2 mmol/L (ref 0.5–1.9)

## 2020-05-14 NOTE — ED Notes (Signed)
Pt called for repeat vital signs. Pt not visualized in the lobby or outside.

## 2020-05-14 NOTE — ED Triage Notes (Signed)
Pt arrived via POV with reports of dysuria x 1 week with foul odor that started today.  PT alert and oriented at this time, pt was brought to ED by daughter.

## 2020-05-14 NOTE — ED Notes (Signed)
Pt called for repeat vital signs, no answer, pt not visualized in the lobby or outside.

## 2020-05-14 NOTE — ED Notes (Signed)
Pt called for repeat vital signs, pt not visualized in the lobby

## 2020-05-14 NOTE — ED Notes (Signed)
Pt ambulating around ED in NAD. Pt attempting to call for a ride

## 2020-05-15 DIAGNOSIS — R69 Illness, unspecified: Secondary | ICD-10-CM | POA: Diagnosis not present

## 2020-05-16 ENCOUNTER — Ambulatory Visit: Payer: Medicare HMO | Admitting: Oncology

## 2020-05-16 ENCOUNTER — Telehealth: Payer: Self-pay | Admitting: Emergency Medicine

## 2020-05-16 ENCOUNTER — Other Ambulatory Visit: Payer: Self-pay | Admitting: Family Medicine

## 2020-05-16 NOTE — Telephone Encounter (Signed)
Called patient due to lwot to inquire about condition and follow up plans. She agrees to call her pcp now and inform them of ed visit and that lab results are available.  I did tell her that her blood sugar was up when she was here.

## 2020-05-16 NOTE — Telephone Encounter (Signed)
If someone else in family could bring a urine sample that day it would be helpful - unsure if they would be able to do that or not Thanks

## 2020-05-16 NOTE — Telephone Encounter (Signed)
Pt called to schedule er follow up for yeast/bladder infection.  She stated she has diarrhea.  Pt is aware this will be a virtual appointment  Appointment 8/19

## 2020-05-17 NOTE — Telephone Encounter (Signed)
Pt has a virtual visit tomorrow for ER f/u / UTI/ Yeast inf, please advise

## 2020-05-17 NOTE — Telephone Encounter (Signed)
Spoke with niece and she will try and have family bring urine sample today or tomorrow before appt., she did advise me that the # pt gave [place] to do virtual visit with is her house # so she is going to call pt and family and make sure someone is there to help pt with this appt

## 2020-05-18 ENCOUNTER — Encounter: Payer: Self-pay | Admitting: Family Medicine

## 2020-05-18 ENCOUNTER — Other Ambulatory Visit: Payer: Self-pay

## 2020-05-18 ENCOUNTER — Telehealth (INDEPENDENT_AMBULATORY_CARE_PROVIDER_SITE_OTHER): Payer: Medicare HMO | Admitting: Family Medicine

## 2020-05-18 DIAGNOSIS — R8290 Unspecified abnormal findings in urine: Secondary | ICD-10-CM | POA: Diagnosis not present

## 2020-05-18 DIAGNOSIS — N760 Acute vaginitis: Secondary | ICD-10-CM

## 2020-05-18 DIAGNOSIS — R300 Dysuria: Secondary | ICD-10-CM

## 2020-05-18 NOTE — Assessment & Plan Note (Addendum)
With vaginal itching and tenderness (susupect yeast given high glucose levels)  terazol was sent in yesterday-she will pick up and start today  Has f/u next week to get pessary put back and can get a ua then  Disc s/s of uti to watch for  Call if no imp with terazol  It will be easier to stay dry once pessary is replaced next week
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
## 2020-05-18 NOTE — Patient Instructions (Signed)
Pick up and use the terazol  Try to keep dry  Follow up with gyn and urology as planned  Let us know if not improving

## 2020-05-19 DIAGNOSIS — R8290 Unspecified abnormal findings in urine: Secondary | ICD-10-CM | POA: Diagnosis not present

## 2020-05-19 DIAGNOSIS — R300 Dysuria: Secondary | ICD-10-CM | POA: Diagnosis not present

## 2020-05-19 LAB — POC URINALSYSI DIPSTICK (AUTOMATED)
Bilirubin, UA: NEGATIVE
Blood, UA: 25
Glucose, UA: POSITIVE — AB
Ketones, UA: NEGATIVE
Nitrite, UA: NEGATIVE
Protein, UA: NEGATIVE
Spec Grav, UA: 1.015 (ref 1.010–1.025)
Urobilinogen, UA: 0.2 E.U./dL
pH, UA: 6.5 (ref 5.0–8.0)

## 2020-05-19 NOTE — Addendum Note (Signed)
Addended by: Tammi Sou on: 05/19/2020 03:04 PM   Modules accepted: Orders

## 2020-05-20 LAB — URINE CULTURE
MICRO NUMBER:: 10853086
Result:: NO GROWTH
SPECIMEN QUALITY:: ADEQUATE
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
## 2020-05-24 ENCOUNTER — Encounter: Payer: Self-pay | Admitting: Urology

## 2020-05-24 ENCOUNTER — Ambulatory Visit: Payer: Self-pay | Admitting: Urology

## 2020-05-26 ENCOUNTER — Other Ambulatory Visit: Payer: Self-pay

## 2020-05-26 ENCOUNTER — Encounter: Payer: Self-pay | Admitting: Obstetrics & Gynecology

## 2020-05-26 ENCOUNTER — Ambulatory Visit (INDEPENDENT_AMBULATORY_CARE_PROVIDER_SITE_OTHER): Payer: Medicare HMO | Admitting: Obstetrics & Gynecology

## 2020-05-26 VITALS — BP 150/90 | Ht 63.0 in | Wt 142.0 lb

## 2020-05-26 DIAGNOSIS — N8110 Cystocele, unspecified: Secondary | ICD-10-CM

## 2020-05-26 DIAGNOSIS — N3946 Mixed incontinence: Secondary | ICD-10-CM | POA: Diagnosis not present

## 2020-05-26 DIAGNOSIS — R350 Frequency of micturition: Secondary | ICD-10-CM

## 2020-05-26 DIAGNOSIS — N8111 Cystocele, midline: Secondary | ICD-10-CM

## 2020-05-26 NOTE — Progress Notes (Signed)
HPI:      [name] is a 84 y.o. who presents today for her pessary follow up and examination related to her pelvic floor weakening.  Pt reports she has done well without pessary and with restart of premarin vag cream 3 x weekly, although she does have vaginal pressure form prolapse.  Prior Ring #5 use but has been without for 2 mos.    PMHx: She  has a past medical history of Arthritis, Bleeding disorder ([place]), Breast cancer ([place]) (10/11/13 dx), Cardiomyopathy ([place]), Cervical cancer ([place]), Demand ischemia ([place]), Full dentures, GERD (gastroesophageal reflux disease), History of stomach ulcers, Hypertension, Mixed hyperlipidemia, Osteoarthritis, Osteopenia, Stress reaction (2009), Type II diabetes mellitus ([place]), Urinary incontinence, Uterus cancer ([place]), and Varicose veins. Also,  has a past surgical history that includes Abdominal hysterectomy (1964); Bladder surgery (1995); Abdominal US (2007); Sclerotherapy varicose veins (5/08); left breast bx (1/15); Breast lumpectomy with needle localization (Left, 11/10/2013); Breast biopsy (Left, 09/2013); Breast biopsy (Left, 12/2014); and Breast lumpectomy (Left, 10/2013)., family history includes Cancer in her brother, brother, father, and sister; Cancer (age of onset: 55) in an other family member; Heart attack in her mother.,  reports that she quit smoking about 24 years ago. Her smoking use included cigarettes. She has a 39.20 pack-year smoking history. She has never used smokeless tobacco. She reports that she does not drink alcohol and does not use drugs.  She has a current medication list which includes the following prescription(s): accu-chek softclix lancets, acetaminophen, aspirin ec, onetouch verio, vitamin d, citalopram, clotrimazole-betamethasone, premarin, cyanocobalamin, famotidine, glipizide, onetouch verio, lisinopril, metformin, methocarbamol, metoprolol succinate, mirabegron er, nystatin-triamcinolone ointment, simvastatin,  terconazole, and gemtesa. Also, is allergic to actos [pioglitazone], alendronate sodium, boniva [ibandronate sodium], ibandronic acid, lansoprazole, and omeprazole.  Review of Systems  All other systems reviewed and are negative.   Objective: BP (!) 150/90   Ht 5\' 3"  (1.6 m)   Wt 142 lb (64.4 kg)   BMI 25.15 kg/m  Physical Exam Constitutional:      General: She is not in acute distress.    Appearance: She is well-developed.  Genitourinary:     Pelvic exam was performed with patient supine.     Vagina normal.     No vaginal erythema or bleeding.     Genitourinary Comments: Cuff intact/ no lesions Vag prolapse and cystocele Absent uterus and cervix  HENT:     Head: Normocephalic and atraumatic.     Nose: Nose normal.  Abdominal:     General: There is no distension.     Palpations: Abdomen is soft.     Tenderness: There is no abdominal tenderness.  Musculoskeletal:        General: Normal range of motion.  Neurological:     Mental Status: She is alert and oriented to person, place, and time.     Cranial Nerves: No cranial nerve deficit.  Skin:    General: Skin is warm and dry.  Psychiatric:        Attention and Perception: Attention normal.        Mood and Affect: Mood normal.        Speech: Speech normal.        Behavior: Behavior normal.        Cognition and Memory: Cognition normal.        Judgment: Judgment normal.   UA NEG except glc  Pessary Care Vagina checked - without erosions - new pessary ring #3 placed.  A/P:   ICD-10-CM  1. Vaginal prolapse  N81.10   2. Cystocele, midline  N81.11   3. Urine frequency  R35.0   4. Mixed stress and urge urinary incontinence  N39.46    Pessary was placed today.  New ring #3, smaller size for hopeful less irritative side effects as before. Instructions given for care. Concerning symptoms to observe for are counseled to patient. Follow up scheduled for 3 months.  A total of 20 minutes were spent face-to-face with the  patient as well as preparation, review, communication, and documentation during this encounter.   Barnett Applebaum, MD, [facility], [facility] 05/26/2020  2:13 PM

## 2020-05-28 ENCOUNTER — Other Ambulatory Visit: Payer: Self-pay | Admitting: Family Medicine

## 2020-05-29 ENCOUNTER — Telehealth: Payer: Self-pay | Admitting: *Deleted

## 2020-05-29 DIAGNOSIS — Z111 Encounter for screening for respiratory tuberculosis: Secondary | ICD-10-CM | POA: Insufficient documentation

## 2020-05-29 NOTE — Telephone Encounter (Signed)
Pt is coming in at 12:30 tomorrow to get TB labs done for [facility] form, please put in Lab order

## 2020-05-29 NOTE — Telephone Encounter (Signed)
Per form TB lab test is fine

## 2020-05-29 NOTE — Telephone Encounter (Signed)
I ordered the TB gold test (if they mandate skin test let me know but I would expect it to be fine) so she does not have to come back for a reading  Thanks

## 2020-05-30 ENCOUNTER — Other Ambulatory Visit (INDEPENDENT_AMBULATORY_CARE_PROVIDER_SITE_OTHER): Payer: Medicare HMO

## 2020-05-30 ENCOUNTER — Other Ambulatory Visit: Payer: Self-pay

## 2020-05-30 DIAGNOSIS — Z111 Encounter for screening for respiratory tuberculosis: Secondary | ICD-10-CM

## 2020-06-02 LAB — QUANTIFERON-TB GOLD PLUS
Mitogen-NIL: >10 IU/mL
NIL: 0.05 IU/mL
QuantiFERON-TB Gold Plus: NEGATIVE
TB1-NIL: <0 IU/mL
TB2-NIL: <0 IU/mL

## 2020-06-12 ENCOUNTER — Telehealth: Payer: Self-pay

## 2020-06-12 ENCOUNTER — Other Ambulatory Visit: Payer: Self-pay

## 2020-06-12 ENCOUNTER — Encounter: Payer: Self-pay | Admitting: Family Medicine

## 2020-06-12 ENCOUNTER — Telehealth (INDEPENDENT_AMBULATORY_CARE_PROVIDER_SITE_OTHER): Payer: Medicare HMO | Admitting: Family Medicine

## 2020-06-12 DIAGNOSIS — R195 Other fecal abnormalities: Secondary | ICD-10-CM | POA: Diagnosis not present

## 2020-06-12 DIAGNOSIS — N8111 Cystocele, midline: Secondary | ICD-10-CM | POA: Diagnosis not present
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
## 2020-06-12 NOTE — Telephone Encounter (Signed)
Pt had VV with provider this morning

## 2020-06-12 NOTE — Assessment & Plan Note (Signed)
Pt had episodic diarrhea and constipation (with urgency after eating) about 2 weeks ago  Now is totally resolved  Had been on abx a month ago-states it did not bother her  States she does drink lots of water (out of control DM)  No new meds  Disc poss of IBS or rxn to something she ate  inst to try a fiber supplement (sugar free)  inst to call if symptoms return or if any abd pain or blood in stool or fever  Feels fine now

## 2020-06-12 NOTE — Telephone Encounter (Signed)
[place] [facility] - Client TELEPHONE ADVICE RECORD [facility] Patient Name: Laura Bell Gender: Female DOB: [birth year] Age: 84 Y 45 M 25 D Return Phone Number: [phone] (Primary) Address: City/State/Zip: [place] [address] Client [facility] - Client Client Site Marianne MD Contact Type Call Who Is Calling Patient / Member / Family / Caregiver Call Type Triage / Clinical Relationship To Patient Self Return Phone Number [phone] (Primary) Chief Complaint Diarrhea Reason for Call Symptomatic / Request for Health Information Initial Comment One day she will be constipated and then the next day she has bad diarrhea. Has been going on for a week. Currently have diarrhea Translation No Nurse Assessment Nurse: Laurena Bering, RN, Helene Kelp Date/Time [name] Time): 06/09/2020 3:32:52 PM Confirm and document reason for call. If symptomatic, describe symptoms. ---Caller states that she has diarrhea after she eats. Had BM today Has the patient had close contact with a person known or suspected to have the novel coronavirus illness OR traveled / lives in area with major community spread (including international travel) in the last 14 days from the onset of symptoms? * If Asymptomatic, screen for exposure and travel within the last 14 days. ---No Does the patient have any new or worsening symptoms? ---Yes Will a triage be completed? ---Yes Related visit to physician within the last 2 weeks? ---No Does the PT have any chronic conditions? (i.e. diabetes, asthma, this includes High risk factors for pregnancy, etc.) ---Yes List chronic conditions. ---HTN Is this a behavioral health or substance abuse call? ---No Guidelines Guideline Title Affirmed Question Affirmed Notes Nurse Date/Time (Eastern Time) Diarrhea [1] MILD diarrhea (e.g., 1-3 or more stools than normal in past 24  hours) without known cause AND [2] present > 7 days Milton Ferguson 06/09/2020 3:35:08 PMPLEASE NOTE: All timestamps contained within this report are represented as [place] Standard Time. CONFIDENTIALTY NOTICE: This fax transmission is intended only for the addressee. It contains information that is legally privileged, confidential or otherwise protected from use or disclosure. If you are not the intended recipient, you are strictly prohibited from reviewing, disclosing, copying using or disseminating any of this information or taking any action in reliance on or regarding this information. If you have received this fax in error, please notify us immediately by telephone so that we can arrange for its return to [place]. Phone: [phone], Toll-Free: [phone], Fax: [phone] Page: 2 of 2 Call Id: [id number] [place]. Time [name] Time) Disposition Final User 06/09/2020 3:29:17 PM Send To RN Personal Robby Sermon, RN, April 06/09/2020 3:37:20 PM SEE PCP WITHIN 3 DAYS Yes Laurena Bering, RN, [place] Artist Disagree/Comply Comply Caller Understands Yes PreDisposition Call Doctor Care Advice Given Per Guideline SEE PCP WITHIN 3 DAYS: FLUID THERAPY DURING MILD TO MODERATE DIARRHEA: * Drink more fluids, at least 8 to 10 cups daily. One cup equals 8 oz (240 ml). * WATER: For mild to moderate diarrhea, water is often the best liquid to drink. You should also eat some salty foods (e.g., potato chips, pretzels, saltine crackers). This is important to make sure you are getting enough salt, sugars, and fluids to meet your body's needs. * Begin with boiled starches / cereals (e.g., potatoes, rice, noodles, wheat, oats) with a small amount of salt to taste. * You can also eat bananas, yogurt, crackers, soup. CALL BACK IF: * Signs of dehydration occur (e.g., no urine over 12 hours, very dry mouth, lightheaded, etc.) * Bloody stools *  Constant or severe abdominal pain * You become worse CARE ADVICE given per Diarrhea (Adult)  guideline. Referrals Warm transfer to backline

## 2020-06-12 NOTE — Assessment & Plan Note (Signed)
Pessary just fell out- getting it replaced tomorrow This helps-makes her more comfortable

## 2020-06-12 NOTE — Patient Instructions (Signed)
Try a fiber supplement like sugar free metamucil /citrucel or benefiber with lots of fluids Keep up a good diet with fruits/veg  Watch for return of stool changes or abdominal pain/ blood in stool, fever or other symptoms (call)

## 2020-06-13 ENCOUNTER — Ambulatory Visit (INDEPENDENT_AMBULATORY_CARE_PROVIDER_SITE_OTHER): Payer: Medicare HMO | Admitting: Obstetrics & Gynecology

## 2020-06-13 ENCOUNTER — Other Ambulatory Visit: Payer: Self-pay

## 2020-06-13 ENCOUNTER — Encounter: Payer: Self-pay | Admitting: Obstetrics & Gynecology

## 2020-06-13 VITALS — BP 140/80 | Ht 62.0 in | Wt 145.0 lb

## 2020-06-13 DIAGNOSIS — N8111 Cystocele, midline: Secondary | ICD-10-CM

## 2020-06-13 DIAGNOSIS — N3946 Mixed incontinence: Secondary | ICD-10-CM

## 2020-06-13 DIAGNOSIS — R350 Frequency of micturition: Secondary | ICD-10-CM

## 2020-06-13 DIAGNOSIS — N8110 Cystocele, unspecified: Secondary | ICD-10-CM

## 2020-06-13 NOTE — Progress Notes (Signed)
HPI:      [name] is a 84 y.o.  who presents today for her pessary recently falling out, after she was changed to a RIng #3 based on prior vaginal irritation.  She has also restarted her twice weekly Premarin vaginal therapy.  She feels more pressure and has worse incontinence now that the pessary has been out.  No bleeding.   PMHx: She  has a past medical history of Arthritis, Bleeding disorder ([place]), Breast cancer ([place]) (10/11/13 dx), Cardiomyopathy ([place]), Cervical cancer ([place]), Demand ischemia ([place]), Full dentures, GERD (gastroesophageal reflux disease), History of stomach ulcers, Hypertension, Mixed hyperlipidemia, Osteoarthritis, Osteopenia, Stress reaction (2009), Type II diabetes mellitus ([place]), Urinary incontinence, Uterus cancer ([place]), and Varicose veins. Also,  has a past surgical history that includes Abdominal hysterectomy (1964); Bladder surgery (1995); Abdominal US (2007); Sclerotherapy varicose veins (5/08); left breast bx (1/15); Breast lumpectomy with needle localization (Left, 11/10/2013); Breast biopsy (Left, 09/2013); Breast biopsy (Left, 12/2014); and Breast lumpectomy (Left, 10/2013)., family history includes Cancer in her brother, brother, father, and sister; Cancer (age of onset: 22) in an other family member; Heart attack in her mother.,  reports that she quit smoking about 24 years ago. Her smoking use included cigarettes. She has a 39.20 pack-year smoking history. She has never used smokeless tobacco. She reports that she does not drink alcohol and does not use drugs.  She has a current medication list which includes the following prescription(s): accu-chek softclix lancets, acetaminophen, aspirin ec, onetouch verio, vitamin d, citalopram, clotrimazole-betamethasone, premarin, cyanocobalamin, famotidine, glipizide, onetouch verio, lisinopril, metformin, methocarbamol, metoprolol succinate, mirabegron er, nystatin-triamcinolone ointment, simvastatin, terconazole, and  gemtesa. Also, is allergic to actos [pioglitazone], alendronate sodium, boniva [ibandronate sodium], ibandronic acid, lansoprazole, and omeprazole.  Review of Systems  All other systems reviewed and are negative.   Objective: BP 140/80   Ht 5\' 2"  (1.575 m)   Wt 145 lb (65.8 kg)   BMI 26.52 kg/m  Physical Exam Constitutional:      General: She is not in acute distress.    Appearance: She is well-developed.  Genitourinary:     Pelvic exam was performed with patient supine.     Vagina normal.     No vaginal erythema or bleeding.     Genitourinary Comments: Cuff intact/ no lesions Gr 3 cystocele Absent uterus and cervix  HENT:     Head: Normocephalic and atraumatic.     Nose: Nose normal.  Abdominal:     General: There is no distension.     Palpations: Abdomen is soft.     Tenderness: There is no abdominal tenderness.  Musculoskeletal:        General: Normal range of motion.  Neurological:     Mental Status: She is alert and oriented to person, place, and time.     Cranial Nerves: No cranial nerve deficit.  Skin:    General: Skin is warm and dry.  Psychiatric:        Attention and Perception: Attention normal.        Mood and Affect: Mood normal.        Speech: Speech normal.        Behavior: Behavior normal.        Cognition and Memory: Cognition normal.        Judgment: Judgment normal.     Pessary Care Pessary removed and cleaned.  Vagina checked - without erosions - pessary replaced.  A/P:   ICD-10-CM   1. Vaginal prolapse  N81.10   2. Cystocele, midline  N81.11   3. Urine frequency  R35.0   4. Mixed stress and urge urinary incontinence  N39.46    Pessary #5 (Ring w Support) was replaced today. Instructions given for care. Concerning symptoms to observe for are counseled to patient. Follow up scheduled for 3 months.  A total of 20 minutes were spent face-to-face with the patient as well as preparation, review, communication, and documentation during this  encounter.   Barnett Applebaum, MD, [facility], [facility] 06/13/2020  1:27 PM

## 2020-06-15 ENCOUNTER — Ambulatory Visit: Payer: Medicare HMO | Admitting: Obstetrics & Gynecology

## 2020-06-21 ENCOUNTER — Telehealth: Payer: Self-pay | Admitting: Obstetrics & Gynecology

## 2020-06-21 NOTE — Telephone Encounter (Signed)
Patient is scheduled for 07/03/20 with [facility]. Patient added to cancellation list if anything comes up with can bring her in earlier

## 2020-06-21 NOTE — Telephone Encounter (Signed)
-----   Message from [place], LPN sent at 6/50/3546  9:23 AM EDT ----- Regarding: RE: pessary Pessary arrived please schedule apt for placement at her convenience. ----- Message ----- From: Quintella Baton, [place] Sent: 06/13/2020   1:24 PM EDT To: Otila Kluver, LPN Subject: pessary                                        Can you order a ring size 4

## 2020-06-23 ENCOUNTER — Ambulatory Visit: Payer: Medicare HMO | Admitting: Family Medicine

## 2020-06-23 DIAGNOSIS — Z0289 Encounter for other administrative examinations: Secondary | ICD-10-CM

## 2020-07-03 ENCOUNTER — Ambulatory Visit (INDEPENDENT_AMBULATORY_CARE_PROVIDER_SITE_OTHER): Payer: Medicare HMO | Admitting: Obstetrics & Gynecology

## 2020-07-03 ENCOUNTER — Other Ambulatory Visit: Payer: Self-pay

## 2020-07-03 ENCOUNTER — Encounter: Payer: Self-pay | Admitting: Obstetrics & Gynecology

## 2020-07-03 VITALS — BP 142/90 | Ht 62.0 in | Wt 145.0 lb

## 2020-07-03 DIAGNOSIS — N8111 Cystocele, midline: Secondary | ICD-10-CM

## 2020-07-03 DIAGNOSIS — N8110 Cystocele, unspecified: Secondary | ICD-10-CM | POA: Diagnosis not present

## 2020-07-03 NOTE — Progress Notes (Signed)
HPI:      Laura Bell is a 84 y.o. who presents today for her pessary replacement after expulsion last week.  She was changed to a Ring w Support #5 due to prior expulsion of a #3.  She had been on a number 5 for a while, and was changed to #3 due to pain and bleeding, to see if smaller size would help.  It subsequently fell out, and then we left it our for a while but uncomfortable sx's of prolapse resumed as well as urinary incontinence. Also using premarin vaginal therapy.  PMHx: She  has a past medical history of Arthritis, Bleeding disorder ([place]), Breast cancer ([place]) (10/11/13 dx), Cardiomyopathy ([place]), Cervical cancer ([place]), Demand ischemia ([place]), Full dentures, GERD (gastroesophageal reflux disease), History of stomach ulcers, Hypertension, Mixed hyperlipidemia, Osteoarthritis, Osteopenia, Stress reaction (2009), Type II diabetes mellitus ([place]), Urinary incontinence, Uterus cancer ([place]), and Varicose veins. Also,  has a past surgical history that includes Abdominal hysterectomy (1964); Bladder surgery (1995); Abdominal US (2007); Sclerotherapy varicose veins (5/08); left breast bx (1/15); Breast lumpectomy with needle localization (Left, 11/10/2013); Breast biopsy (Left, 09/2013); Breast biopsy (Left, 12/2014); and Breast lumpectomy (Left, 10/2013)., family history includes Cancer in her brother, brother, father, and sister; Cancer (age of onset: 18) in an other family member; Heart attack in her mother.,  reports that she quit smoking about 24 years ago. Her smoking use included cigarettes. She has a 39.20 pack-year smoking history. She has never used smokeless tobacco. She reports that she does not drink alcohol and does not use drugs.  She has a current medication list which includes the following prescription(s): accu-chek softclix lancets, acetaminophen, aspirin ec, onetouch verio, vitamin d, citalopram, clotrimazole-betamethasone, premarin, cyanocobalamin, famotidine, glipizide,  onetouch verio, lisinopril, metformin, methocarbamol, metoprolol succinate, mirabegron er, nystatin-triamcinolone ointment, simvastatin, terconazole, and gemtesa. Also, is allergic to actos [pioglitazone], alendronate sodium, boniva [ibandronate sodium], ibandronic acid, lansoprazole, and omeprazole.  Review of Systems  All other systems reviewed and are negative.   Objective: BP (!) 142/90   Ht 5\' 2"  (1.575 m)   Wt 145 lb (65.8 kg)   BMI 26.52 kg/m  Physical Exam Constitutional:      General: She is not in acute distress.    Appearance: She is well-developed.  Genitourinary:     Pelvic exam was performed with patient supine.     Vagina normal.     No vaginal erythema or bleeding.     Genitourinary Comments: Cuff intact/ no lesions Cystocele Gr3 and vaginal prolapse past introitus noted (without pessary) Absent uterus and cervix  HENT:     Head: Normocephalic and atraumatic.     Nose: Nose normal.  Abdominal:     General: There is no distension.     Palpations: Abdomen is soft.     Tenderness: There is no abdominal tenderness.  Musculoskeletal:        General: Normal range of motion.  Neurological:     Mental Status: She is alert and oriented to person, place, and time.     Cranial Nerves: No cranial nerve deficit.  Skin:    General: Skin is warm and dry.  Psychiatric:        Attention and Perception: Attention normal.        Mood and Affect: Mood normal.        Speech: Speech normal.        Behavior: Behavior normal.        Cognition and Memory: Cognition normal.  Judgment: Judgment normal.     Pessary Care Pessary removed and cleaned.  Vagina checked - without erosions - pessary replaced.  A/P:   ICD-10-CM   1. Vaginal prolapse  N81.10   2. Cystocele, midline  N81.11   Changed to a Ring without support size #4 Next option will be a Gellhorn. Instructions given for care. Concerning symptoms to observe for are counseled to patient. Follow up scheduled  for 1-2 months.  A total of 20 minutes were spent face-to-face with the patient as well as preparation, review, communication, and documentation during this encounter.   Barnett Applebaum, MD, [facility], [facility] 07/03/2020  2:10 PM

## 2020-07-05 ENCOUNTER — Telehealth: Payer: Self-pay | Admitting: Obstetrics & Gynecology

## 2020-07-05 NOTE — Telephone Encounter (Signed)
Patient is calling to see if she could be worked in sometime soon due to her pessary falling out. Patient is scheduled for next available appointment for 07/21/20

## 2020-07-05 NOTE — Telephone Encounter (Signed)
Make sure we have a Gellhorn pessary size 3in, and then can schedule, or keep appt 10/22

## 2020-07-06 NOTE — Telephone Encounter (Signed)
Ok I have sent Laura Bell a message to order one

## 2020-07-09 ENCOUNTER — Other Ambulatory Visit: Payer: Self-pay | Admitting: Family Medicine

## 2020-07-10 IMAGING — CR DG CHEST 2V
1 series · 2 of 2 positions shown · non-contrast
Comparison: None.

CLINICAL DATA: Pain after fall

EXAM:
CHEST - 2 VIEW

[Series 1: dg chest 2 view · 0.14mm/px · 2 of 2 slices shown]
[im 1/2]
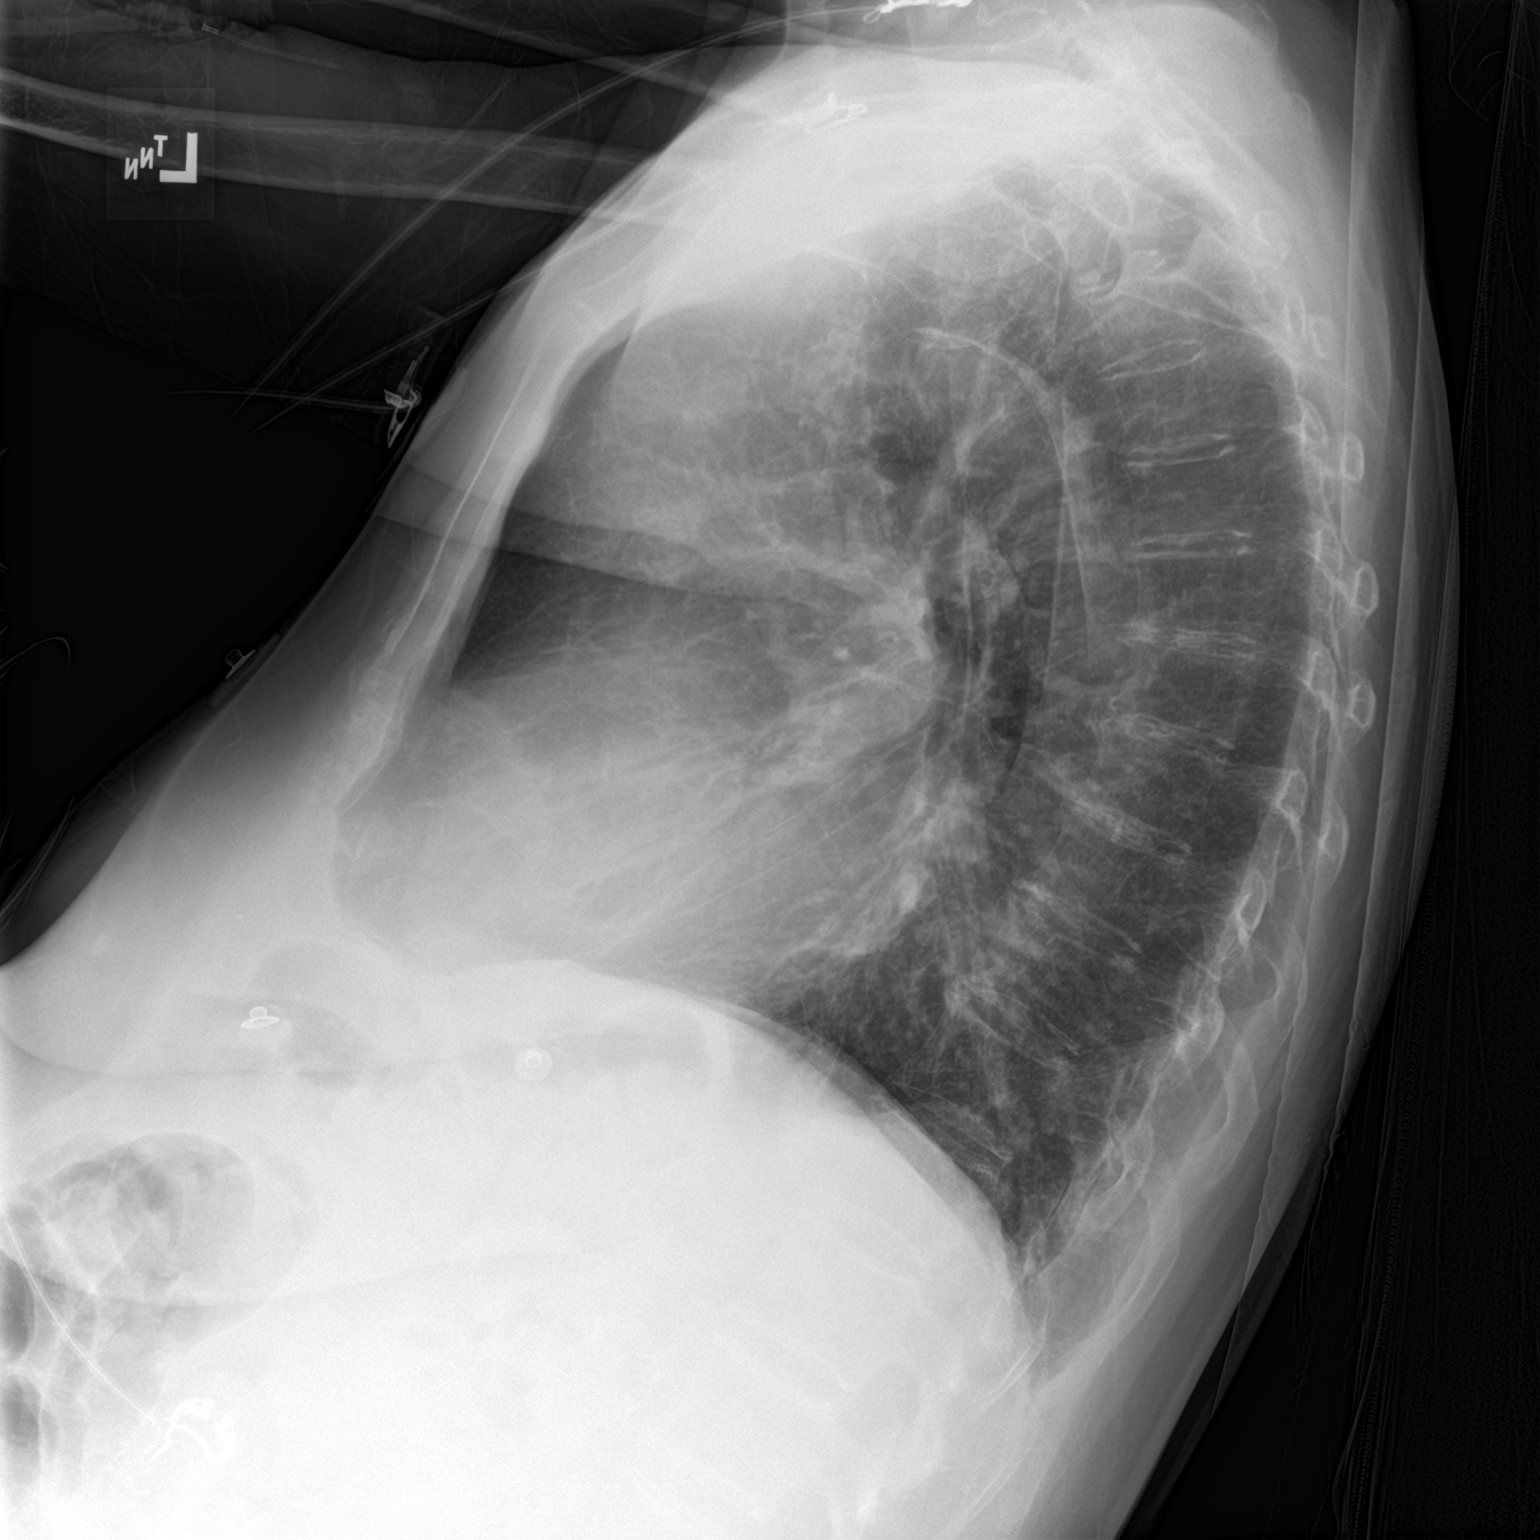
[im 2/2]
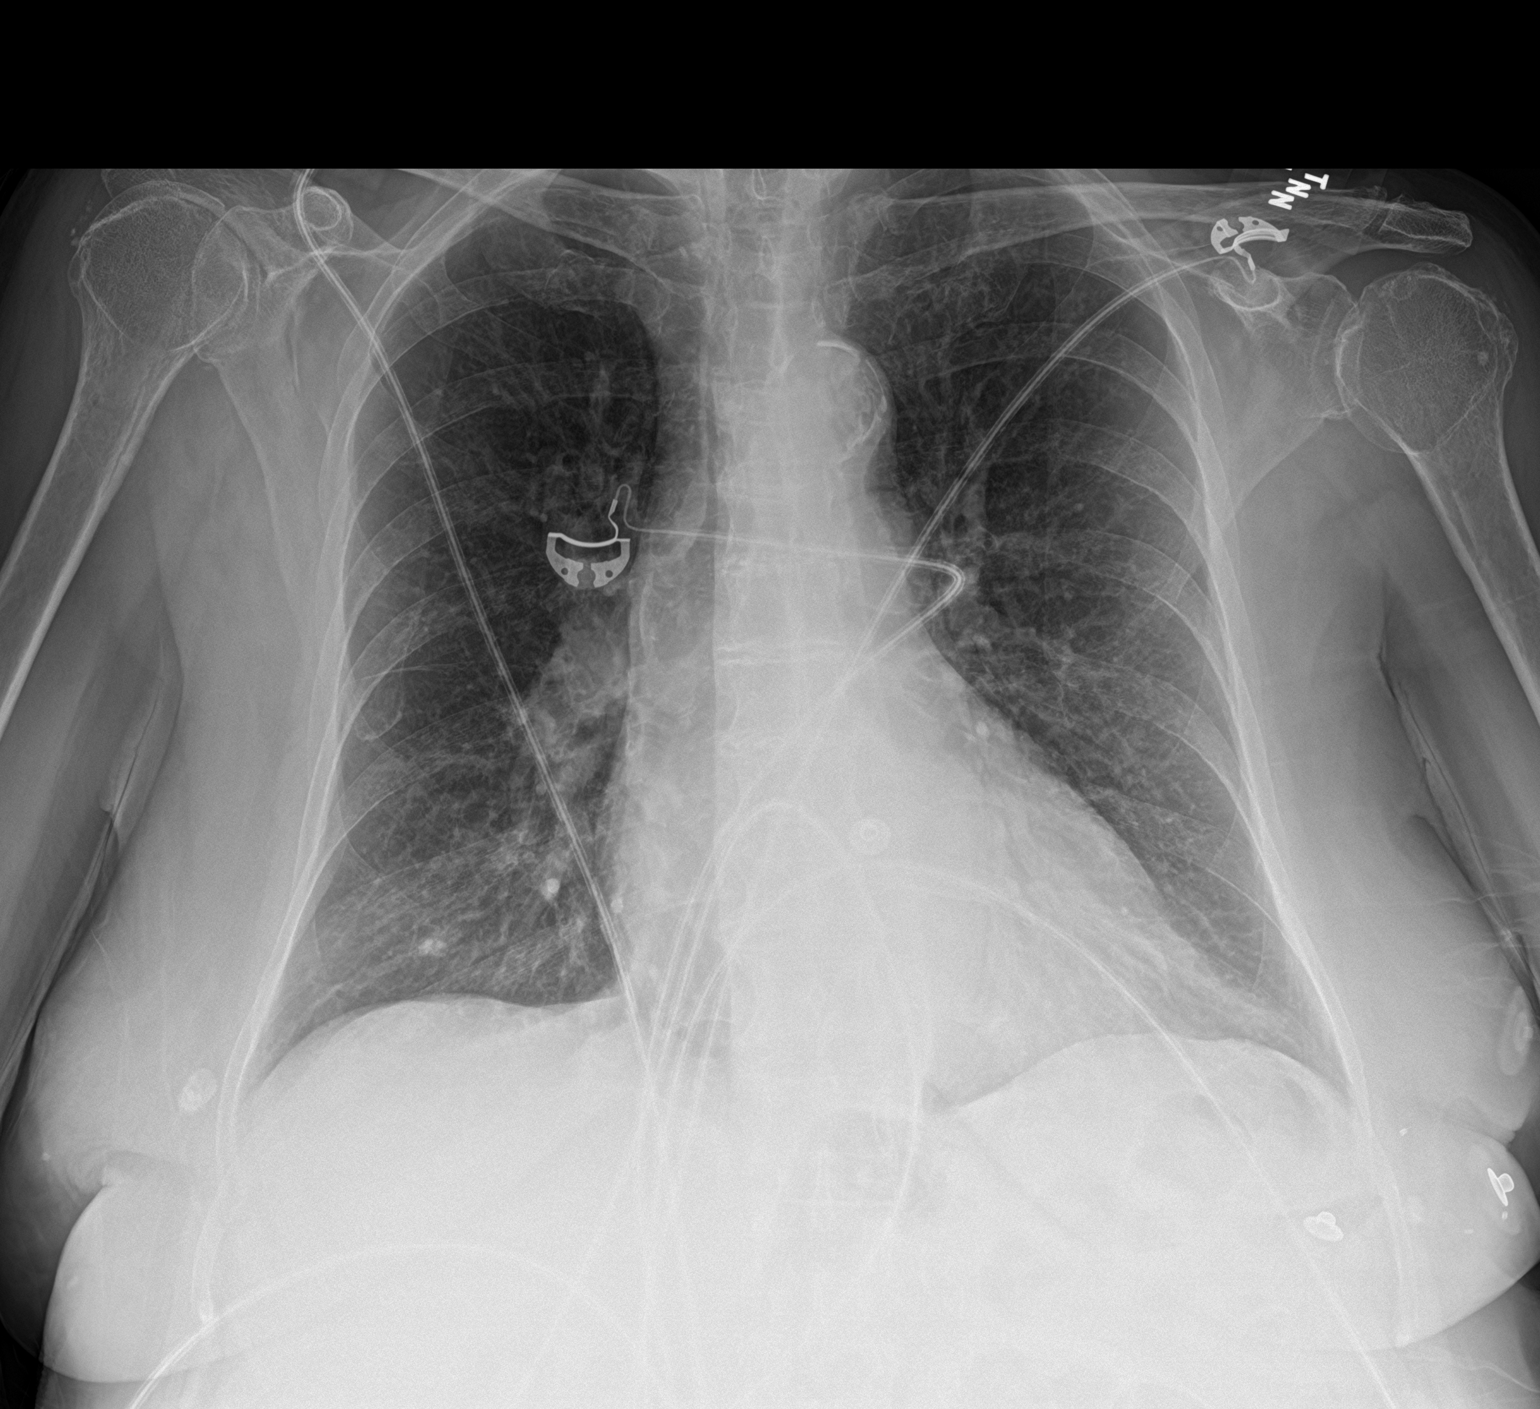

[2 of 2 positions shown; findings below may reference images not displayed]

FINDINGS: The heart size and mediastinal contours are within normal limits.
Both lungs are clear. The visualized skeletal structures are
unremarkable.
IMPRESSION: No active cardiopulmonary disease.

## 2020-07-10 NOTE — Telephone Encounter (Signed)
Pt no-showed 6 month f/u appt scheduled on 06/23/20, no future appts., please advise

## 2020-07-21 ENCOUNTER — Encounter: Payer: Self-pay | Admitting: Obstetrics & Gynecology

## 2020-07-21 ENCOUNTER — Other Ambulatory Visit: Payer: Self-pay

## 2020-07-21 ENCOUNTER — Ambulatory Visit (INDEPENDENT_AMBULATORY_CARE_PROVIDER_SITE_OTHER): Payer: Medicare HMO | Admitting: Obstetrics & Gynecology

## 2020-07-21 VITALS — BP 160/90 | Ht 60.0 in | Wt 146.0 lb

## 2020-07-21 DIAGNOSIS — N8110 Cystocele, unspecified: Secondary | ICD-10-CM

## 2020-07-21 DIAGNOSIS — N8111 Cystocele, midline: Secondary | ICD-10-CM

## 2020-07-21 NOTE — Progress Notes (Signed)
HPI:      [name] is a 84 y.o. who presents today for her pessary follow up and examination related to her pelvic floor weakening.  Pt reports pessary expulsion again soon after its placement.  She currently has a Ring type #4 pessary.  She has tolerated ring #5 and now 4 in the past (2 years) but now has frequent expulsions.  PMHx: She  has a past medical history of Arthritis, Bleeding disorder ([place]), Breast cancer ([place]) (10/11/13 dx), Cardiomyopathy ([place]), Cervical cancer ([place]), Demand ischemia ([place]), Full dentures, GERD (gastroesophageal reflux disease), History of stomach ulcers, Hypertension, Mixed hyperlipidemia, Osteoarthritis, Osteopenia, Stress reaction (2009), Type II diabetes mellitus ([place]), Urinary incontinence, Uterus cancer ([place]), and Varicose veins. Also,  has a past surgical history that includes Abdominal hysterectomy (1964); Bladder surgery (1995); Abdominal US (2007); Sclerotherapy varicose veins (5/08); left breast bx (1/15); Breast lumpectomy with needle localization (Left, 11/10/2013); Breast biopsy (Left, 09/2013); Breast biopsy (Left, 12/2014); and Breast lumpectomy (Left, 10/2013)., family history includes Cancer in her brother, brother, father, and sister; Cancer (age of onset: 89) in an other family member; Heart attack in her mother.,  reports that she quit smoking about 24 years ago. Her smoking use included cigarettes. She has a 39.20 pack-year smoking history. She has never used smokeless tobacco. She reports that she does not drink alcohol and does not use drugs.  She has a current medication list which includes the following prescription(s): accu-chek softclix lancets, acetaminophen, aspirin ec, onetouch verio, vitamin d, citalopram, clotrimazole-betamethasone, premarin, cyanocobalamin, famotidine, glipizide, onetouch verio, lisinopril, metformin, methocarbamol, metoprolol succinate, mirabegron er, nystatin-triamcinolone ointment, simvastatin, terconazole, and  gemtesa. Also, is allergic to actos [pioglitazone], alendronate sodium, boniva [ibandronate sodium], ibandronic acid, lansoprazole, and omeprazole.  Review of Systems  All other systems reviewed and are negative.   Objective: BP (!) 160/90   Ht 5' (1.524 m)   Wt 146 lb (66.2 kg)   BMI 28.51 kg/m  Physical Exam Constitutional:      General: She is not in acute distress.    Appearance: She is well-developed.  Genitourinary:     Pelvic exam was performed with patient supine.     Vagina normal.     No vaginal erythema or bleeding.     Genitourinary Comments: Cuff intact/ no lesions Absent uterus and cervix Vaginal prolpase and cystocele on exam without pessary  HENT:     Head: Normocephalic and atraumatic.     Nose: Nose normal.  Abdominal:     General: There is no distension.     Palpations: Abdomen is soft.     Tenderness: There is no abdominal tenderness.  Musculoskeletal:        General: Normal range of motion.  Neurological:     Mental Status: She is alert and oriented to person, place, and time.     Cranial Nerves: No cranial nerve deficit.  Skin:    General: Skin is warm and dry.  Psychiatric:        Attention and Perception: Attention normal.        Mood and Affect: Mood normal.        Speech: Speech normal.        Behavior: Behavior normal.        Cognition and Memory: Cognition normal.        Judgment: Judgment normal.   A/P:   ICD-10-CM   1. Vaginal prolapse  N81.10   2. Cystocele, midline  N81.11    Pessary 3  in [place] placed today. Instructions given for care. Concerning symptoms to observe for are counseled to patient. Follow up scheduled for 1 months.  A total of 20 minutes were spent face-to-face with the patient as well as preparation, review, communication, and documentation during this encounter.   Barnett Applebaum, MD, [facility], [facility] 07/21/2020  11:16 AM

## 2020-07-24 ENCOUNTER — Telehealth: Payer: Self-pay | Admitting: Family Medicine

## 2020-07-24 NOTE — Telephone Encounter (Signed)
Arbie Cookey (daughter) called stating the form that was faxed to you was for medicaid.  It just needs to stated pt will not be returning home for care.  She will be staying at long term facility   She is also faxed an FL2 form to be filled out

## 2020-07-25 ENCOUNTER — Emergency Department
Admission: EM | Admit: 2020-07-25 | Discharge: 2020-07-25 | Disposition: A | Discharge status: Home / Self Care | Payer: Medicare HMO | Attending: Student in an Organized Health Care Education/Training Program | Admitting: Student in an Organized Health Care Education/Training Program

## 2020-07-25 ENCOUNTER — Ambulatory Visit: Payer: Medicare HMO | Admitting: Obstetrics and Gynecology

## 2020-07-25 ENCOUNTER — Encounter: Payer: Self-pay | Admitting: Emergency Medicine

## 2020-07-25 ENCOUNTER — Telehealth: Payer: Self-pay | Admitting: Family Medicine

## 2020-07-25 ENCOUNTER — Ambulatory Visit: Payer: Medicare HMO | Admitting: Family Medicine

## 2020-07-25 ENCOUNTER — Other Ambulatory Visit: Payer: Self-pay

## 2020-07-25 DIAGNOSIS — Z90710 Acquired absence of both cervix and uterus: Secondary | ICD-10-CM | POA: Insufficient documentation

## 2020-07-25 DIAGNOSIS — Z7984 Long term (current) use of oral hypoglycemic drugs: Secondary | ICD-10-CM | POA: Insufficient documentation

## 2020-07-25 DIAGNOSIS — Z7982 Long term (current) use of aspirin: Secondary | ICD-10-CM | POA: Diagnosis not present

## 2020-07-25 DIAGNOSIS — Y762 Prosthetic and other implants, materials and accessory obstetric and gynecological devices associated with adverse incidents: Secondary | ICD-10-CM | POA: Diagnosis not present

## 2020-07-25 DIAGNOSIS — I10 Essential (primary) hypertension: Secondary | ICD-10-CM | POA: Insufficient documentation

## 2020-07-25 DIAGNOSIS — R102 Pelvic and perineal pain: Secondary | ICD-10-CM | POA: Insufficient documentation

## 2020-07-25 DIAGNOSIS — Z8541 Personal history of malignant neoplasm of cervix uteri: Secondary | ICD-10-CM | POA: Diagnosis not present

## 2020-07-25 DIAGNOSIS — E1165 Type 2 diabetes mellitus with hyperglycemia: Secondary | ICD-10-CM | POA: Insufficient documentation

## 2020-07-25 DIAGNOSIS — R739 Hyperglycemia, unspecified: Secondary | ICD-10-CM

## 2020-07-25 DIAGNOSIS — Z853 Personal history of malignant neoplasm of breast: Secondary | ICD-10-CM | POA: Insufficient documentation

## 2020-07-25 DIAGNOSIS — Z79899 Other long term (current) drug therapy: Secondary | ICD-10-CM | POA: Insufficient documentation

## 2020-07-25 DIAGNOSIS — T839XXA Unspecified complication of genitourinary prosthetic device, implant and graft, initial encounter: Secondary | ICD-10-CM

## 2020-07-25 DIAGNOSIS — T83711A Erosion of implanted vaginal mesh and other prosthetic materials to surrounding organ or tissue, initial encounter: Secondary | ICD-10-CM | POA: Diagnosis not present

## 2020-07-25 DIAGNOSIS — Z87891 Personal history of nicotine dependence: Secondary | ICD-10-CM | POA: Diagnosis not present

## 2020-07-25 LAB — URINALYSIS, COMPLETE (UACMP) WITH MICROSCOPIC
Bacteria, UA: NONE SEEN
Bilirubin Urine: NEGATIVE
Glucose, UA: >=500 mg/dL — AB
Ketones, ur: 20 mg/dL — AB
Leukocytes,Ua: NEGATIVE
Nitrite: NEGATIVE
Protein, ur: NEGATIVE mg/dL
Specific Gravity, Urine: 1.035 — ABNORMAL HIGH (ref 1.005–1.030)
pH: 5 (ref 5.0–8.0)

## 2020-07-25 LAB — COMPREHENSIVE METABOLIC PANEL
ALT: 13 U/L (ref 0–44)
AST: 14 U/L — ABNORMAL LOW (ref 15–41)
Albumin: 4.1 g/dL (ref 3.5–5.0)
Alkaline Phosphatase: 87 U/L (ref 38–126)
Anion gap: 10 (ref 5–15)
BUN: 9 mg/dL (ref 8–23)
CO2: 24 mmol/L (ref 22–32)
Calcium: 9.6 mg/dL (ref 8.9–10.3)
Chloride: 99 mmol/L (ref 98–111)
Creatinine, Ser: 0.53 mg/dL (ref 0.44–1.00)
GFR, Estimated: >60 mL/min (ref >60)
Glucose, Bld: 421 mg/dL — ABNORMAL HIGH (ref 70–99)
Potassium: 4 mmol/L (ref 3.5–5.1)
Sodium: 133 mmol/L — ABNORMAL LOW (ref 135–145)
Total Bilirubin: 0.6 mg/dL (ref 0.3–1.2)
Total Protein: 7.4 g/dL (ref 6.5–8.1)

## 2020-07-25 LAB — LIPASE, BLOOD: Lipase: 28 U/L (ref 11–51)

## 2020-07-25 LAB — CBC
HCT: 40.4 % (ref 36.0–46.0)
Hemoglobin: 12.6 g/dL (ref 12.0–15.0)
MCH: 26.3 pg (ref 26.0–34.0)
MCHC: 31.2 g/dL (ref 30.0–36.0)
MCV: 84.3 fL (ref 80.0–100.0)
Platelets: 259 10*3/uL (ref 150–400)
RBC: 4.79 MIL/uL (ref 3.87–5.11)
RDW: 14 % (ref 11.5–15.5)
WBC: 11.1 10*3/uL — ABNORMAL HIGH (ref 4.0–10.5)
nRBC: 0 % (ref 0.0–0.2)

## 2020-07-25 LAB — GLUCOSE, CAPILLARY: Glucose-Capillary: 120 mg/dL — ABNORMAL HIGH (ref 70–99)

## 2020-07-25 MED ORDER — SODIUM CHLORIDE 0.9 % IV BOLUS
500.0000 mL | Freq: Once | INTRAVENOUS | Status: AC
  Administered 2020-07-25: 500 mL via INTRAVENOUS

## 2020-07-25 MED ORDER — INSULIN ASPART 100 UNIT/ML ~~LOC~~ SOLN
4.0000 [IU] | Freq: Once | SUBCUTANEOUS | Status: AC
  Administered 2020-07-25: 4 [IU] via INTRAVENOUS
  Filled 2020-07-25: qty 1

## 2020-07-25 NOTE — Telephone Encounter (Signed)
Laura Bell called to r/s appointment.  Pt is at [facility]  Her blood sugar was 457.  Pt was incoherent stating her rectum is painful  Pt has diarrhea Pt not taking meds correct

## 2020-07-25 NOTE — ED Notes (Signed)
Pt presents to ED with daughter at bedside. Pt states she recently had a pessary placed due a uterus/bladder prolapse. Pt states this has been hurting since she has had it placed. Pt also has c/o of rectal pain and diarrhea. Daughter states pt has not been taking her meds as RX'ed. Daughter states pt normally has diarrhea. Pt states slight burning on urination. Pt deneis fevers or chill.s Daughter explains to this RN pt was suppose to have a appt today to have pessary removed 'but we are here instead". Pt is A&Ox4.

## 2020-07-25 NOTE — ED Triage Notes (Signed)
Pt in w/pelvic pain and hyperglycemia, CBG 457 when she woke up. Pt states she had a pessary placed last Friday, and it has been causing her constant pain since. Reports frequent BM's d/t "rectal pressure", no diarrhea or bloody stools noted. Is reporting some mild low abdomen pain w/freq urination
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
## 2020-07-25 NOTE — Telephone Encounter (Signed)
Held off on completing paperwork since she was in the ER- looks like they discharged her.  How is she feeling today?

## 2020-07-25 NOTE — Telephone Encounter (Signed)
Form placed in PCP's inbox for review and also received FL2 and placed in inbox

## 2020-07-25 NOTE — ED Notes (Signed)
D?c and taking daily meds with pt and daughter, both verbalized understanding. Pessary removed by Dr. Quentin Cornwall, pt tolerated well. No distress noted.

## 2020-07-26 ENCOUNTER — Ambulatory Visit: Payer: Medicare HMO | Admitting: Obstetrics and Gynecology

## 2020-07-27 NOTE — Telephone Encounter (Signed)
Called to check on pt she said she is feeling a little better since leaving the ER. Pt said she is just a little sore on her back but nothing major. Pt has a f/u with PCP on Tuesday and said she doesn't have any concerns or issues that can't wait till that appt, pt advise if sxs worsen or if she develops any new sxs to let us know. Pt verbalized understanding
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
## 2020-07-31 ENCOUNTER — Encounter: Payer: Self-pay | Admitting: Oncology

## 2020-07-31 ENCOUNTER — Inpatient Hospital Stay: Payer: Medicare HMO | Attending: Oncology | Admitting: Oncology

## 2020-07-31 DIAGNOSIS — C50512 Malignant neoplasm of lower-outer quadrant of left female breast: Secondary | ICD-10-CM

## 2020-07-31 DIAGNOSIS — Z170 Estrogen receptor positive status [ER+]: Secondary | ICD-10-CM

## 2020-08-02 ENCOUNTER — Ambulatory Visit (INDEPENDENT_AMBULATORY_CARE_PROVIDER_SITE_OTHER): Payer: Medicare HMO | Admitting: Family Medicine

## 2020-08-02 ENCOUNTER — Other Ambulatory Visit: Payer: Self-pay

## 2020-08-02 ENCOUNTER — Encounter: Payer: Self-pay | Admitting: Family Medicine

## 2020-08-02 VITALS — BP 156/82 | HR 102 | Temp 97.6°F | Ht 62.0 in | Wt 143.7 lb

## 2020-08-02 DIAGNOSIS — N3946 Mixed incontinence: Secondary | ICD-10-CM | POA: Diagnosis not present

## 2020-08-02 DIAGNOSIS — E785 Hyperlipidemia, unspecified: Secondary | ICD-10-CM | POA: Diagnosis not present

## 2020-08-02 DIAGNOSIS — E1165 Type 2 diabetes mellitus with hyperglycemia: Secondary | ICD-10-CM | POA: Diagnosis not present

## 2020-08-02 DIAGNOSIS — Z741 Need for assistance with personal care: Secondary | ICD-10-CM

## 2020-08-02 DIAGNOSIS — I10 Essential (primary) hypertension: Secondary | ICD-10-CM

## 2020-08-02 DIAGNOSIS — E538 Deficiency of other specified B group vitamins: Secondary | ICD-10-CM | POA: Diagnosis not present

## 2020-08-02 DIAGNOSIS — Z23 Encounter for immunization: Secondary | ICD-10-CM | POA: Diagnosis not present

## 2020-08-02 DIAGNOSIS — E1169 Type 2 diabetes mellitus with other specified complication: Secondary | ICD-10-CM | POA: Diagnosis not present

## 2020-08-02 LAB — CBC WITH DIFFERENTIAL/PLATELET
Basophils Absolute: 0.1 10*3/uL (ref 0.0–0.1)
Basophils Relative: 0.8 % (ref 0.0–3.0)
Eosinophils Absolute: 0.1 10*3/uL (ref 0.0–0.7)
Eosinophils Relative: 1.9 % (ref 0.0–5.0)
HCT: 37.3 % (ref 36.0–46.0)
Hemoglobin: 12.2 g/dL (ref 12.0–15.0)
Lymphocytes Relative: 15.6 % (ref 12.0–46.0)
Lymphs Abs: 1 10*3/uL (ref 0.7–4.0)
MCHC: 32.7 g/dL (ref 30.0–36.0)
MCV: 82.7 fl (ref 78.0–100.0)
Monocytes Absolute: 0.6 10*3/uL (ref 0.1–1.0)
Monocytes Relative: 8.5 % (ref 3.0–12.0)
Neutro Abs: 4.9 10*3/uL (ref 1.4–7.7)
Neutrophils Relative %: 73.2 % (ref 43.0–77.0)
Platelets: 270 10*3/uL (ref 150.0–400.0)
RBC: 4.51 Mil/uL (ref 3.87–5.11)
RDW: 15.1 % (ref 11.5–15.5)
WBC: 6.6 10*3/uL (ref 4.0–10.5)

## 2020-08-02 LAB — COMPREHENSIVE METABOLIC PANEL
ALT: 9 U/L (ref 0–35)
AST: 11 U/L (ref 0–37)
Albumin: 3.9 g/dL (ref 3.5–5.2)
Alkaline Phosphatase: 70 U/L (ref 39–117)
BUN: 7 mg/dL (ref 6–23)
CO2: 28 mEq/L (ref 19–32)
Calcium: 9.3 mg/dL (ref 8.4–10.5)
Chloride: 99 mEq/L (ref 96–112)
Creatinine, Ser: 0.58 mg/dL (ref 0.40–1.20)
GFR: 84.35 mL/min (ref >60.00)
Glucose, Bld: 291 mg/dL — ABNORMAL HIGH (ref 70–99)
Potassium: 4.3 mEq/L (ref 3.5–5.1)
Sodium: 134 mEq/L — ABNORMAL LOW (ref 135–145)
Total Bilirubin: 0.4 mg/dL (ref 0.2–1.2)
Total Protein: 6.2 g/dL (ref 6.0–8.3)

## 2020-08-02 LAB — LIPID PANEL
Cholesterol: 163 mg/dL (ref 0–200)
HDL: 65.2 mg/dL (ref >39.00)
LDL Cholesterol: 78 mg/dL (ref 0–99)
NonHDL: 97.63
Total CHOL/HDL Ratio: 2
Triglycerides: 99 mg/dL (ref 0.0–149.0)
VLDL: 19.8 mg/dL (ref 0.0–40.0)

## 2020-08-02 LAB — VITAMIN B12: Vitamin B-12: 819 pg/mL (ref 211–911)

## 2020-08-02 LAB — HEMOGLOBIN A1C: Hgb A1c MFr Bld: 11.7 % — ABNORMAL HIGH (ref 4.6–6.5)

## 2020-08-02 LAB — TSH: TSH: 3.46 u[IU]/mL (ref 0.35–4.50)

## 2020-08-02 NOTE — Assessment & Plan Note (Addendum)
Noted both urinary and occ fecal incontinence for assisted living  Better glucose control would help urinary incontinence
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
## 2020-08-02 NOTE — Assessment & Plan Note (Signed)
bp is not controlled today  BP: (!) 156/82    Likely from medicine noncompliance  Daughter tries to help but works  Planning admission to [facility] for [facility] living  There she will be monitored and have meds admin  Daughter will update with bp once she is settled

## 2020-08-02 NOTE — Assessment & Plan Note (Signed)
A1C is expected to be very high based on bp  Pt declines tx above metformin and gliizide  In addition to that-most likely not taking meds Appetite is fair  Will have the opt of insulin once she is settled in facility -she is open to this if someone admin it  Taking ace and statin

## 2020-08-02 NOTE — Patient Instructions (Addendum)
Flu shot today  Labs today   Get your covid vaccine   Let's see how blood pressure and blood sugar are after getting settled at [facility]

## 2020-08-02 NOTE — Assessment & Plan Note (Signed)
Checking level today with oral supplementation

## 2020-08-02 NOTE — Assessment & Plan Note (Signed)
Worked on [facility] for move to asst living  Needs help with med admin and bathing and meal prep  This should be helpful  Plans to get covid vaccine before admission  Last TB gold test was neg

## 2020-08-02 NOTE — Assessment & Plan Note (Signed)
Disc goals for lipids and reasons to control them Rev last labs with pt Rev low sat fat diet in detail  Labs today  Taking simvastatin

## 2020-08-03 ENCOUNTER — Telehealth: Payer: Self-pay | Admitting: Family Medicine

## 2020-08-03 NOTE — Chronic Care Management (AMB) (Signed)
  Chronic Care Management   Note  08/03/2020 Name: TAYLIA BERBER MRN: [id number] DOB: 1936/06/18  AUTUMM HATTERY is a 84 y.o. year old female who is a primary care patient of Tower, Wynelle Fanny, MD. I reached out to [name] by phone today in response to a referral sent by Ms. Cyndi Lennert PCP, Tower, Wynelle Fanny, MD.   Ms. Merriman was given information about Chronic Care Management services today including:  1. CCM service includes personalized support from designated clinical staff supervised by her physician, including individualized plan of care and coordination with other care providers 2. 24/7 contact phone numbers for assistance for urgent and routine care needs. 3. Service will only be billed when office clinical staff spend 20 minutes or more in a month to coordinate care. 4. Only one practitioner may furnish and bill the service in a calendar month. 5. The patient may stop CCM services at any time (effective at the end of the month) by phone call to the office staff.   Patient agreed to services and verbal consent obtained.   Follow up plan:   [place]

## 2020-08-07 ENCOUNTER — Telehealth: Payer: Self-pay | Admitting: Family Medicine

## 2020-08-07 NOTE — Telephone Encounter (Signed)
Debra @ [facility] received FL2 and med list.  She needs office note or history of pt  Please fax to [phone]

## 2020-08-08 NOTE — Telephone Encounter (Signed)
Last OV note and Dem sheet faxed to [facility]

## 2020-08-10 ENCOUNTER — Telehealth: Payer: Self-pay | Admitting: Family Medicine

## 2020-08-10 NOTE — Telephone Encounter (Signed)
Called Neoma Laming back to see what she needed on pt. She said that on the [facility] form that PCP checked pt needed PT, but in the office note there isn't any mention of pt needed PT. Neoma Laming said if they submit info to insurance they will "kick it back out". Neoma Laming said if PCP can write an order for PT evaluation then they can send that in with all her other ppw. She said we can fax a PT order to [phone], it can't be a verbal since she has to send it to insurance company with everything else

## 2020-08-10 NOTE — Telephone Encounter (Signed)
MRS DEBORAH IS CALLING IN ABOUT PT BUT SHE DOESN'T SEE A ORDER FOR IT. AND WANTED TO KNOW ABOUT THE PERMEANT PLACEMENT.   PLEASE ADVISE

## 2020-08-11 NOTE — Telephone Encounter (Signed)
Order is written in the IN box to fax

## 2020-08-11 NOTE — Telephone Encounter (Signed)
Order faxed.

## 2020-08-13 ENCOUNTER — Other Ambulatory Visit: Payer: Self-pay | Admitting: Family Medicine

## 2020-08-15 DIAGNOSIS — R69 Illness, unspecified: Secondary | ICD-10-CM | POA: Diagnosis not present

## 2020-08-18 ENCOUNTER — Ambulatory Visit: Payer: Medicare HMO | Admitting: Obstetrics & Gynecology

## 2020-08-28 ENCOUNTER — Other Ambulatory Visit: Payer: Self-pay

## 2020-08-28 ENCOUNTER — Encounter: Payer: Self-pay | Admitting: Obstetrics & Gynecology

## 2020-08-28 ENCOUNTER — Ambulatory Visit (INDEPENDENT_AMBULATORY_CARE_PROVIDER_SITE_OTHER): Payer: Medicare HMO | Admitting: Obstetrics & Gynecology

## 2020-08-28 VITALS — BP 140/90 | Ht 62.0 in | Wt 146.0 lb

## 2020-08-28 DIAGNOSIS — N8111 Cystocele, midline: Secondary | ICD-10-CM | POA: Diagnosis not present

## 2020-08-28 DIAGNOSIS — N8110 Cystocele, unspecified: Secondary | ICD-10-CM

## 2020-08-28 NOTE — Progress Notes (Signed)
HPI:      [name] is a 84 y.o.  who presents today for her pessary follow up and examination related to her pelvic floor weakening.  Pt reports tolerating the pessary poorly with rectal pressure severe enough to go to ER to have removed a few days after its placement;  no vaginal bleeding and no vaginal discharge.  Symptoms of pelvic floor weakening have greatly improved when a pessary is in and tolerated. She is voiding and defecating without difficulty. She currently had tried a GELLHORN 3 inch pessary.  Prior expulsion of rings.  Since it has been out these past few weeks, she notes sx's of lump and pressure vaginally.  She has GSI, and this is not changed significantly in past w or w/out pessary.  PMHx: She  has a past medical history of Arthritis, Bleeding disorder ([place]), Breast cancer ([place]) (10/11/13 dx), Cardiomyopathy ([place]), Cervical cancer ([place]), Demand ischemia ([place]), Full dentures, GERD (gastroesophageal reflux disease), History of stomach ulcers, Hypertension, Mixed hyperlipidemia, Osteoarthritis, Osteopenia, Stress reaction (2009), Type II diabetes mellitus ([place]), Urinary incontinence, Uterus cancer ([place]), and Varicose veins. Also,  has a past surgical history that includes Abdominal hysterectomy (1964); Bladder surgery (1995); Abdominal US (2007); Sclerotherapy varicose veins (5/08); left breast bx (1/15); Breast lumpectomy with needle localization (Left, 11/10/2013); Breast biopsy (Left, 09/2013); Breast biopsy (Left, 12/2014); and Breast lumpectomy (Left, 10/2013)., family history includes Cancer in her brother, brother, father, and sister; Cancer (age of onset: 40) in an other family member; Heart attack in her mother.,  reports that she quit smoking about 24 years ago. Her smoking use included cigarettes. She has a 39.20 pack-year smoking history. She has never used smokeless tobacco. She reports that she does not drink alcohol and does not use drugs.  She has a current  medication list which includes the following prescription(s): accu-chek softclix lancets, acetaminophen, aspirin ec, onetouch verio, vitamin d, citalopram, premarin, cyanocobalamin, famotidine, glipizide, onetouch verio, lisinopril, metformin, metoprolol succinate, and simvastatin. Also, is allergic to actos [pioglitazone], alendronate sodium, boniva [ibandronate sodium], ibandronic acid, lansoprazole, and omeprazole.  Review of Systems  All other systems reviewed and are negative.   Objective: BP 140/90   Ht 5\' 2"  (1.575 m)   Wt 146 lb (66.2 kg)   BMI 26.70 kg/m  Physical Exam Constitutional:      General: She is not in acute distress.    Appearance: She is well-developed.  Genitourinary:     Pelvic exam was performed with patient supine.     Vagina normal.     No vaginal erythema or bleeding.     Genitourinary Comments: Cuff intact/ no lesions Absent uterus and cervix Gr 4 cystocele, to outside of introitus Min rectocele Vag vault prolapse mild at apex  HENT:     Head: Normocephalic and atraumatic.     Nose: Nose normal.  Abdominal:     General: There is no distension.     Palpations: Abdomen is soft.     Tenderness: There is no abdominal tenderness.  Musculoskeletal:        General: Normal range of motion.  Neurological:     Mental Status: She is alert and oriented to person, place, and time.     Cranial Nerves: No cranial nerve deficit.  Skin:    General: Skin is warm and dry.  Psychiatric:        Attention and Perception: Attention normal.        Mood and Affect: Mood normal.  Speech: Speech normal.        Behavior: Behavior normal.        Cognition and Memory: Cognition normal.        Judgment: Judgment normal.    A/P:   ICD-10-CM   1. Vaginal prolapse  N81.10   2. Cystocele, midline  N81.11   Pessary changed to a Gellhorn 2 1/4 inch (#3) & placed today. Instructions given for care. Concerning symptoms to observe for are counseled to patient. Follow  up scheduled for 1 months.  A total of 20 minutes were spent face-to-face with the patient as well as preparation, review, communication, and documentation during this encounter.   Barnett Applebaum, MD, [facility], [facility] 08/28/2020  1:26 PM

## 2020-08-29 DIAGNOSIS — M6281 Muscle weakness (generalized): Secondary | ICD-10-CM | POA: Diagnosis not present

## 2020-08-29 DIAGNOSIS — R2681 Unsteadiness on feet: Secondary | ICD-10-CM | POA: Diagnosis not present

## 2020-08-29 DIAGNOSIS — R41841 Cognitive communication deficit: Secondary | ICD-10-CM | POA: Diagnosis not present

## 2020-08-29 DIAGNOSIS — Z23 Encounter for immunization: Secondary | ICD-10-CM | POA: Diagnosis not present

## 2020-08-31 DIAGNOSIS — R2681 Unsteadiness on feet: Secondary | ICD-10-CM | POA: Diagnosis not present

## 2020-08-31 DIAGNOSIS — M6281 Muscle weakness (generalized): Secondary | ICD-10-CM | POA: Diagnosis not present

## 2020-08-31 DIAGNOSIS — R41841 Cognitive communication deficit: Secondary | ICD-10-CM | POA: Diagnosis not present

## 2020-09-01 DIAGNOSIS — R2681 Unsteadiness on feet: Secondary | ICD-10-CM | POA: Diagnosis not present

## 2020-09-01 DIAGNOSIS — M6281 Muscle weakness (generalized): Secondary | ICD-10-CM | POA: Diagnosis not present

## 2020-09-01 DIAGNOSIS — R41841 Cognitive communication deficit: Secondary | ICD-10-CM | POA: Diagnosis not present

## 2020-09-05 DIAGNOSIS — E118 Type 2 diabetes mellitus with unspecified complications: Secondary | ICD-10-CM | POA: Diagnosis not present

## 2020-09-05 DIAGNOSIS — M1990 Unspecified osteoarthritis, unspecified site: Secondary | ICD-10-CM | POA: Diagnosis not present

## 2020-09-05 DIAGNOSIS — I10 Essential (primary) hypertension: Secondary | ICD-10-CM | POA: Diagnosis not present

## 2020-09-05 DIAGNOSIS — R262 Difficulty in walking, not elsewhere classified: Secondary | ICD-10-CM | POA: Diagnosis not present

## 2020-09-06 ENCOUNTER — Ambulatory Visit: Payer: Medicare HMO

## 2020-09-06 NOTE — Chronic Care Management (AMB) (Signed)
Chronic Care Management Pharmacy  Name: ALSACE DOWD  MRN: [id number] DOB: Apr 26, 1936   Chief Complaint/ HPI  Laura Bell,  84 y.o. , female contacted for their Initial CCM visit with the clinical pharmacist via telephone. Unable to reach patient by telephone. Chart reviewed.   PCP : Abner Greenspan, MD  Their chronic conditions include: HTN, DM, HLD, cardiomyopathy, GERD, OA, osteopenia, anxiety, urinary incontinence, anemia   Office Visits:  08/02/20: PCP - noncompliance, DM and HTN uncontrolled, transitioning to [facility] assisted living  Consult Visit:  08/28/20: OBGYN - pessary follow up and examination related to her pelvic floor weakening. Pessary changed to a Gellhorn 2 1/4 inch (#3) & placed today. F/u 1 month  Medications: Outpatient Encounter Medications as of 09/06/2020  Medication Sig  . Accu-Chek Softclix Lancets lancets USE 1  TO CHECK GLUCOSE TWICE DAILY AND  AS  NEEDED  . acetaminophen (TYLENOL) 500 MG tablet Take 500 mg by mouth every 6 (six) hours as needed.    Marland Kitchen aspirin EC 81 MG tablet Take 81 mg by mouth daily.  . Blood Glucose Monitoring Suppl (ONETOUCH VERIO) w/Device KIT 1 Device by Other route 2 (two) times daily. **ONETOUCH VERIO** Use to check blood sugar twice daily for DM (dx. E11.65)  . Cholecalciferol (VITAMIN D) 2000 UNITS CAPS Take 2,000 Units by mouth daily.   . citalopram (CELEXA) 20 MG tablet Take 1 tablet by mouth in the evening  . conjugated estrogens (PREMARIN) vaginal cream Apply 0.77m (pea-sized amount)  just inside the vaginal introitus with a finger-tip on  Monday, Wednesday and Friday nights.  . cyanocobalamin 1000 MCG tablet Take 1,000 mcg by mouth daily.  . famotidine (PEPCID) 40 MG tablet Take 1 tablet (40 mg total) by mouth daily. For acid reflux  . glipiZIDE (GLUCOTROL) 10 MG tablet Take 1 tablet (10 mg total) by mouth 2 (two) times daily before a meal.  . glucose blood (ONETOUCH VERIO) test strip USE 1 STRIP TO CHECK  GLUCOSE TWICE DAILY (DX.  E11.65)  . lisinopril (ZESTRIL) 40 MG tablet Take 1 tablet by mouth once daily  . metFORMIN (GLUCOPHAGE) 1000 MG tablet TAKE 1 TABLET BY MOUTH TWICE DAILY WITH A MEAL  . metoprolol succinate (TOPROL-XL) 50 MG 24 hr tablet TAKE 1 TABLET BY MOUTH ONCE DAILY **TAKE  WITH  OR  IMMEDIATELY  FOLLOWING  A  MEAL**  . simvastatin (ZOCOR) 20 MG tablet TAKE ONE TABLET BY MOUTH IN THE EVENING WITH  A  LOW  FAT  SNACK   No facility-administered encounter medications on file as of 09/06/2020.   Hypertension   CMP Latest Ref Rng & Units 08/02/2020 07/25/2020 05/14/2020  Glucose 70 - 99 mg/dL 291(H) 421(H) 471(H)  BUN 6 - 23 mg/dL _0 Creatinine 0.40 - 1.20 mg/dL 0.58 0.53 0.57  Sodium 135 - 145 mEq/L 134(L) 133(L) 132(L)  Potassium 3.5 - 5.1 mEq/L 4.3 4.0 4.2  Chloride 96 - 112 mEq/L 99 99 98  CO2 19 - 32 mEq/L _1 Calcium 8.4 - 10.5 mg/dL 9.3 9.6 9.5  Total Protein 6.0 - 8.3 g/dL 6.2 7.4 -  Total Bilirubin 0.2 - 1.2 mg/dL 0.4 0.6 -  Alkaline Phos 39 - 117 U/L 70 87 -  AST 0 - 37 U/L 11 14(L) -  ALT 0 - 35 U/L 9 13 -   Office blood pressures are: BP Readings from Last 3 Encounters:  08/28/20 140/90  08/02/20 (!) 156/82  07/25/20 (!) 161/83   Patient is currently on the following medications:   Lisinopril 40 mg - 1 tablet daily  Metoprolol succinate ER 50 mg - 1 tablet daily  09/06/20: Reviewed refill history, lisinopril and metoprolol filled by [facility] 08/31/20  Hyperlipidemia/CV risk   LDL goal < 100  Last lipids Lab Results  Component Value Date   CHOL 163 08/02/2020   HDL 65.20 08/02/2020   LDLCALC 78 08/02/2020   TRIG 99.0 08/02/2020   CHOLHDL 2 08/02/2020   Hepatic Function Latest Ref Rng & Units 08/02/2020 07/25/2020 11/30/2019  Total Protein 6.0 - 8.3 g/dL 6.2 7.4 6.5  Albumin 3.5 - 5.2 g/dL 3.9 4.1 3.9  AST 0 - 37 U/L 11 14(L) 9  ALT 0 - 35 U/L _0 Alk Phosphatase 39 - 117 U/L 70 87 95  Total Bilirubin 0.2 - 1.2 mg/dL 0.4  0.6 0.3  Bilirubin, Direct 0.0 - 0.3 mg/dL - - -     The ASCVD Risk score (Hardy., et al., 2013) failed to calculate for the following reasons:   The 2013 ASCVD risk score is only valid for ages 42 to 24   Patient has failed these meds in past: none reported Patient is currently on the following medications:  . Simvastatin 20 mg - 1 tablet daily . Aspirin 81 mg - 1 tablet daily  09/06/20: Reviewed refill history, simvastatin filled by [facility] 08/31/20  Diabetes   Recent Relevant Labs: Lab Results  Component Value Date/Time   HGBA1C 11.7 (H) 08/02/2020 10:09 AM   HGBA1C 8.4 (H) 11/30/2019 01:41 PM   MICROALBUR 0.2 05/02/2008 09:00 AM   MICROALBUR 4.0 (H) 01/19/2008 08:26 AM    Patient is currently on the following medications:   Glipizide 10 mg - 1 tablet daily  Metformin 500 mg - 1 tablet BID  Lab Results  Component Value Date/Time   HMDIABEYEEXA No Retinopathy 03/31/2020 12:00 AM    Lab Results  Component Value Date/Time   HMDIABFOOTEX yes 12/08/2008 12:00 AM    GERD   Patient is currently  on the following medications:   Pepcid 40 mg - 1 tablet daily   Osteopenia     Vit D, 25-Hydroxy  Date Value Ref Range Status  08/25/2013 30 30 - 89 ng/mL Final    Comment:    This assay accurately quantifies Vitamin D, which is the sum of the 25-Hydroxy forms of Vitamin D2 and D3.  Studies have shown that the optimum concentration of 25-Hydroxy Vitamin D is 30 ng/mL or higher.  Concentrations of Vitamin D between 20 and 29 ng/mL are considered to be insufficient and concentrations less than 20 ng/mL are considered to be deficient for Vitamin D.    Patient is currently on the following medications:  Marland Kitchen Vitamin D3 50 mcg - 1 tablet daily  Anxiety   Patient is currently on the following medications:   Citalopram 20 mg - 1 tablet daily  Medication Management   Misc: B12 1000 mcg - 1 tablet daily  Patient's preferred pharmacy is:  [facility]  [address], [place] [address] Phone: [phone] Fax: [phone]  Debbora Dus, PharmD Clinical Pharmacist [facility] [phone]

## 2020-09-11 DIAGNOSIS — I429 Cardiomyopathy, unspecified: Secondary | ICD-10-CM | POA: Diagnosis not present

## 2020-09-11 DIAGNOSIS — R262 Difficulty in walking, not elsewhere classified: Secondary | ICD-10-CM | POA: Diagnosis not present

## 2020-09-11 DIAGNOSIS — K219 Gastro-esophageal reflux disease without esophagitis: Secondary | ICD-10-CM | POA: Diagnosis not present

## 2020-09-11 DIAGNOSIS — I10 Essential (primary) hypertension: Secondary | ICD-10-CM | POA: Diagnosis not present

## 2020-09-12 ENCOUNTER — Encounter: Payer: Self-pay | Admitting: Family Medicine

## 2020-09-12 DIAGNOSIS — R2681 Unsteadiness on feet: Secondary | ICD-10-CM | POA: Diagnosis not present

## 2020-09-12 DIAGNOSIS — M6281 Muscle weakness (generalized): Secondary | ICD-10-CM | POA: Diagnosis not present

## 2020-09-12 DIAGNOSIS — R41841 Cognitive communication deficit: Secondary | ICD-10-CM | POA: Diagnosis not present

## 2020-09-13 DIAGNOSIS — M6281 Muscle weakness (generalized): Secondary | ICD-10-CM | POA: Diagnosis not present

## 2020-09-13 DIAGNOSIS — R2681 Unsteadiness on feet: Secondary | ICD-10-CM | POA: Diagnosis not present

## 2020-09-13 DIAGNOSIS — R41841 Cognitive communication deficit: Secondary | ICD-10-CM | POA: Diagnosis not present

## 2020-09-14 DIAGNOSIS — R41841 Cognitive communication deficit: Secondary | ICD-10-CM | POA: Diagnosis not present

## 2020-09-14 DIAGNOSIS — R2681 Unsteadiness on feet: Secondary | ICD-10-CM | POA: Diagnosis not present

## 2020-09-14 DIAGNOSIS — M6281 Muscle weakness (generalized): Secondary | ICD-10-CM | POA: Diagnosis not present

## 2020-09-15 DIAGNOSIS — R41841 Cognitive communication deficit: Secondary | ICD-10-CM | POA: Diagnosis not present

## 2020-09-15 DIAGNOSIS — M6281 Muscle weakness (generalized): Secondary | ICD-10-CM | POA: Diagnosis not present

## 2020-09-15 DIAGNOSIS — R2681 Unsteadiness on feet: Secondary | ICD-10-CM | POA: Diagnosis not present

## 2020-09-17 DIAGNOSIS — M6281 Muscle weakness (generalized): Secondary | ICD-10-CM | POA: Diagnosis not present

## 2020-09-17 DIAGNOSIS — R41841 Cognitive communication deficit: Secondary | ICD-10-CM | POA: Diagnosis not present

## 2020-09-17 DIAGNOSIS — R2681 Unsteadiness on feet: Secondary | ICD-10-CM | POA: Diagnosis not present

## 2020-09-18 DIAGNOSIS — R2681 Unsteadiness on feet: Secondary | ICD-10-CM | POA: Diagnosis not present

## 2020-09-18 DIAGNOSIS — R41841 Cognitive communication deficit: Secondary | ICD-10-CM | POA: Diagnosis not present

## 2020-09-18 DIAGNOSIS — M6281 Muscle weakness (generalized): Secondary | ICD-10-CM | POA: Diagnosis not present

## 2020-09-19 DIAGNOSIS — M6281 Muscle weakness (generalized): Secondary | ICD-10-CM | POA: Diagnosis not present

## 2020-09-19 DIAGNOSIS — R2681 Unsteadiness on feet: Secondary | ICD-10-CM | POA: Diagnosis not present

## 2020-09-19 DIAGNOSIS — R41841 Cognitive communication deficit: Secondary | ICD-10-CM | POA: Diagnosis not present

## 2020-09-20 ENCOUNTER — Telehealth: Payer: Self-pay

## 2020-09-20 DIAGNOSIS — R2681 Unsteadiness on feet: Secondary | ICD-10-CM | POA: Diagnosis not present

## 2020-09-20 DIAGNOSIS — R41841 Cognitive communication deficit: Secondary | ICD-10-CM | POA: Diagnosis not present

## 2020-09-20 DIAGNOSIS — M6281 Muscle weakness (generalized): Secondary | ICD-10-CM | POA: Diagnosis not present

## 2020-09-20 NOTE — Telephone Encounter (Signed)
I spoke with Arbie Cookey (DPR signed) pt saw [facility] NP about a wk ago. Arbie Cookey said she will call [facility] today to find out what is going on.Arbie Cookey thought that the doctor at [facility] would be taking care of her mom while she was at that facility. Arbie Cookey said if her mom called last evening pt may have been confused. Arbie Cookey will ck on today and will cb if needed. FYI to Dr Glori Bickers.

## 2020-09-20 NOTE — Telephone Encounter (Signed)
Aware, will watch for correspondence  

## 2020-09-20 NOTE — Telephone Encounter (Signed)
[facility] - Client TELEPHONE ADVICE RECORD [facility] Patient Name: Laura Bell Gender: Female DOB: 1936/06/28 Age: 84 Y 68 M 5 D Return Phone Number: [phone] (Primary), [phone] (Secondary) [place] [address] Client [facility] - Client Client Site [place] MD Contact Type Call Who Is Calling Patient / Member / Family / Caregiver Call Type Triage / Clinical Relationship To Patient Self Return Phone Number [phone] (Primary) Chief Complaint Leg Swelling And Edema Reason for Call Symptomatic / Request for Health Information Initial Comment Call sent from office. Caller states her feet and knees are swollen x 6 days. Translation No Nurse Assessment Nurse: Laura Done, RN, Melanie Date/Time (Eastern Time): 09/19/2020 4:59:04 PM Confirm and document reason for call. If symptomatic, describe symptoms. ---Caller states her knees and legs are swollen and her feet will not down. saw a Dr a week or more ago and was told to prop her knees and legs up. Does the patient have any new or worsening symptoms? ---Yes Will a triage be completed? ---Yes Related visit to physician within the last 2 weeks? ---Yes Does the PT have any chronic conditions? (i.e. diabetes, asthma, this includes High risk factors for pregnancy, etc.) ---Yes List chronic conditions. ---diabetes Is this a behavioral health or substance abuse call? ---No Guidelines Guideline Title Affirmed Question Affirmed Notes Nurse Date/Time Laura Bell Time) Leg Swelling and Edema [1] MODERATE leg swelling (e.g., swelling extends up to knees) AND [2] new-onset or worsening Laura Bell, Laura Bell 09/19/2020 5:01:00 PM Disp. Time Laura Bell Time) Disposition Final User 09/19/2020 5:07:47 PM See PCP within 24 Hours Yes Laura Done, RN, Laura Bell Caller Disagree/Comply Comply PLEASE NOTE: All  timestamps contained within this report are represented as [place] Standard Time. CONFIDENTIALTY NOTICE: This fax transmission is intended only for the addressee. It contains information that is legally privileged, confidential or otherwise protected from use or disclosure. If you are not the intended recipient, you are strictly prohibited from reviewing, disclosing, copying using or disseminating any of this information or taking any action in reliance on or regarding this information. If you have received this fax in error, please notify us immediately by telephone so that we can arrange for its return to [place]. Phone: [phone], Toll-Free: [phone], Fax: [phone] Page: 2 of 2 Call Id: [id number] [place] Understands Yes PreDisposition Call Doctor Care Advice Given Per Guideline SEE PCP WITHIN 24 HOURS: * IF OFFICE WILL BE OPEN: You need to be examined within the next 24 hours. Call your doctor (or NP/PA) when the office opens and make an appointment. LEG SWELLING OR EDEMA: * Elevate your legs or try to lie down one or two times a day for 20 minutes. * Avoid socks with an elastic band at the top. CARE ADVICE given per Leg Swelling and Edema (Adult) guideline. CALL BACK IF: * Breathing difficulty or chest pain occurs * You become worse Referrals REFERRED TO PCP OFFICE

## 2020-09-21 DIAGNOSIS — M6281 Muscle weakness (generalized): Secondary | ICD-10-CM | POA: Diagnosis not present

## 2020-09-21 DIAGNOSIS — R2681 Unsteadiness on feet: Secondary | ICD-10-CM | POA: Diagnosis not present

## 2020-09-21 DIAGNOSIS — R41841 Cognitive communication deficit: Secondary | ICD-10-CM | POA: Diagnosis not present

## 2020-09-22 DIAGNOSIS — R2681 Unsteadiness on feet: Secondary | ICD-10-CM | POA: Diagnosis not present

## 2020-09-22 DIAGNOSIS — M6281 Muscle weakness (generalized): Secondary | ICD-10-CM | POA: Diagnosis not present

## 2020-09-22 DIAGNOSIS — R41841 Cognitive communication deficit: Secondary | ICD-10-CM | POA: Diagnosis not present

## 2020-09-24 DIAGNOSIS — R41841 Cognitive communication deficit: Secondary | ICD-10-CM | POA: Diagnosis not present

## 2020-09-24 DIAGNOSIS — R2681 Unsteadiness on feet: Secondary | ICD-10-CM | POA: Diagnosis not present

## 2020-09-24 DIAGNOSIS — M6281 Muscle weakness (generalized): Secondary | ICD-10-CM | POA: Diagnosis not present

## 2020-09-25 DIAGNOSIS — E118 Type 2 diabetes mellitus with unspecified complications: Secondary | ICD-10-CM | POA: Diagnosis not present

## 2020-09-25 DIAGNOSIS — W19XXXD Unspecified fall, subsequent encounter: Secondary | ICD-10-CM | POA: Diagnosis not present

## 2020-09-25 DIAGNOSIS — I10 Essential (primary) hypertension: Secondary | ICD-10-CM | POA: Diagnosis not present

## 2020-09-25 DIAGNOSIS — R609 Edema, unspecified: Secondary | ICD-10-CM | POA: Diagnosis not present

## 2020-09-26 DIAGNOSIS — M6281 Muscle weakness (generalized): Secondary | ICD-10-CM | POA: Diagnosis not present

## 2020-09-26 DIAGNOSIS — R41841 Cognitive communication deficit: Secondary | ICD-10-CM | POA: Diagnosis not present

## 2020-09-26 DIAGNOSIS — R222 Localized swelling, mass and lump, trunk: Secondary | ICD-10-CM | POA: Diagnosis not present

## 2020-09-26 DIAGNOSIS — E119 Type 2 diabetes mellitus without complications: Secondary | ICD-10-CM | POA: Diagnosis not present

## 2020-09-26 DIAGNOSIS — R2681 Unsteadiness on feet: Secondary | ICD-10-CM | POA: Diagnosis not present

## 2020-09-26 DIAGNOSIS — I5022 Chronic systolic (congestive) heart failure: Secondary | ICD-10-CM | POA: Diagnosis not present

## 2020-09-27 DIAGNOSIS — M6281 Muscle weakness (generalized): Secondary | ICD-10-CM | POA: Diagnosis not present

## 2020-09-27 DIAGNOSIS — R41841 Cognitive communication deficit: Secondary | ICD-10-CM | POA: Diagnosis not present

## 2020-09-27 DIAGNOSIS — R2681 Unsteadiness on feet: Secondary | ICD-10-CM | POA: Diagnosis not present

## 2020-09-28 DIAGNOSIS — R2681 Unsteadiness on feet: Secondary | ICD-10-CM | POA: Diagnosis not present

## 2020-09-28 DIAGNOSIS — M6281 Muscle weakness (generalized): Secondary | ICD-10-CM | POA: Diagnosis not present

## 2020-09-28 DIAGNOSIS — R41841 Cognitive communication deficit: Secondary | ICD-10-CM | POA: Diagnosis not present

## 2020-09-29 DIAGNOSIS — R2681 Unsteadiness on feet: Secondary | ICD-10-CM | POA: Diagnosis not present

## 2020-09-29 DIAGNOSIS — R41841 Cognitive communication deficit: Secondary | ICD-10-CM | POA: Diagnosis not present

## 2020-09-29 DIAGNOSIS — M6281 Muscle weakness (generalized): Secondary | ICD-10-CM | POA: Diagnosis not present

## 2020-10-01 NOTE — Progress Notes (Signed)
I have personally reviewed this encounter including the documentation in this note and have collaborated with the care management provider regarding care management and care coordination activities to include development and update of the comprehensive care plan. I am certifying that I agree with the content of this note and encounter as supervising physician. ? ?Ryland Tungate MD  ?

## 2020-10-02 ENCOUNTER — Ambulatory Visit: Payer: Medicare HMO | Admitting: Obstetrics & Gynecology

## 2020-10-02 DIAGNOSIS — M6281 Muscle weakness (generalized): Secondary | ICD-10-CM | POA: Diagnosis not present

## 2020-10-02 DIAGNOSIS — Z79899 Other long term (current) drug therapy: Secondary | ICD-10-CM | POA: Diagnosis not present

## 2020-10-02 DIAGNOSIS — R2681 Unsteadiness on feet: Secondary | ICD-10-CM | POA: Diagnosis not present

## 2020-10-03 DIAGNOSIS — R2681 Unsteadiness on feet: Secondary | ICD-10-CM | POA: Diagnosis not present

## 2020-10-03 DIAGNOSIS — M6281 Muscle weakness (generalized): Secondary | ICD-10-CM | POA: Diagnosis not present

## 2020-10-04 DIAGNOSIS — R69 Illness, unspecified: Secondary | ICD-10-CM | POA: Diagnosis not present

## 2020-10-04 DIAGNOSIS — W19XXXD Unspecified fall, subsequent encounter: Secondary | ICD-10-CM | POA: Diagnosis not present

## 2020-10-04 DIAGNOSIS — E118 Type 2 diabetes mellitus with unspecified complications: Secondary | ICD-10-CM | POA: Diagnosis not present

## 2020-10-04 DIAGNOSIS — E785 Hyperlipidemia, unspecified: Secondary | ICD-10-CM | POA: Diagnosis not present

## 2020-10-04 DIAGNOSIS — M6281 Muscle weakness (generalized): Secondary | ICD-10-CM | POA: Diagnosis not present

## 2020-10-04 DIAGNOSIS — R2681 Unsteadiness on feet: Secondary | ICD-10-CM | POA: Diagnosis not present

## 2020-10-04 DIAGNOSIS — K219 Gastro-esophageal reflux disease without esophagitis: Secondary | ICD-10-CM | POA: Diagnosis not present

## 2020-10-04 DIAGNOSIS — I10 Essential (primary) hypertension: Secondary | ICD-10-CM | POA: Diagnosis not present

## 2020-10-04 DIAGNOSIS — R609 Edema, unspecified: Secondary | ICD-10-CM | POA: Diagnosis not present

## 2020-10-05 DIAGNOSIS — M6281 Muscle weakness (generalized): Secondary | ICD-10-CM | POA: Diagnosis not present

## 2020-10-05 DIAGNOSIS — R2681 Unsteadiness on feet: Secondary | ICD-10-CM | POA: Diagnosis not present

## 2020-10-06 DIAGNOSIS — R2681 Unsteadiness on feet: Secondary | ICD-10-CM | POA: Diagnosis not present

## 2020-10-06 DIAGNOSIS — M6281 Muscle weakness (generalized): Secondary | ICD-10-CM | POA: Diagnosis not present

## 2020-10-09 DIAGNOSIS — R2681 Unsteadiness on feet: Secondary | ICD-10-CM | POA: Diagnosis not present

## 2020-10-09 DIAGNOSIS — M6281 Muscle weakness (generalized): Secondary | ICD-10-CM | POA: Diagnosis not present

## 2020-10-10 DIAGNOSIS — R2681 Unsteadiness on feet: Secondary | ICD-10-CM | POA: Diagnosis not present

## 2020-10-10 DIAGNOSIS — M6281 Muscle weakness (generalized): Secondary | ICD-10-CM | POA: Diagnosis not present

## 2020-10-11 DIAGNOSIS — M6281 Muscle weakness (generalized): Secondary | ICD-10-CM | POA: Diagnosis not present

## 2020-10-11 DIAGNOSIS — R2681 Unsteadiness on feet: Secondary | ICD-10-CM | POA: Diagnosis not present

## 2020-10-12 DIAGNOSIS — R2681 Unsteadiness on feet: Secondary | ICD-10-CM | POA: Diagnosis not present

## 2020-10-12 DIAGNOSIS — M6281 Muscle weakness (generalized): Secondary | ICD-10-CM | POA: Diagnosis not present

## 2020-10-13 DIAGNOSIS — M6281 Muscle weakness (generalized): Secondary | ICD-10-CM | POA: Diagnosis not present

## 2020-10-13 DIAGNOSIS — R2681 Unsteadiness on feet: Secondary | ICD-10-CM | POA: Diagnosis not present

## 2020-10-17 DIAGNOSIS — M6281 Muscle weakness (generalized): Secondary | ICD-10-CM | POA: Diagnosis not present

## 2020-10-17 DIAGNOSIS — R2681 Unsteadiness on feet: Secondary | ICD-10-CM | POA: Diagnosis not present

## 2020-10-18 DIAGNOSIS — M6281 Muscle weakness (generalized): Secondary | ICD-10-CM | POA: Diagnosis not present

## 2020-10-18 DIAGNOSIS — R2681 Unsteadiness on feet: Secondary | ICD-10-CM | POA: Diagnosis not present

## 2020-10-20 DIAGNOSIS — M6281 Muscle weakness (generalized): Secondary | ICD-10-CM | POA: Diagnosis not present

## 2020-10-20 DIAGNOSIS — R2681 Unsteadiness on feet: Secondary | ICD-10-CM | POA: Diagnosis not present

## 2020-10-21 DIAGNOSIS — M6281 Muscle weakness (generalized): Secondary | ICD-10-CM | POA: Diagnosis not present

## 2020-10-21 DIAGNOSIS — R2681 Unsteadiness on feet: Secondary | ICD-10-CM | POA: Diagnosis not present

## 2020-10-22 DIAGNOSIS — M6281 Muscle weakness (generalized): Secondary | ICD-10-CM | POA: Diagnosis not present

## 2020-10-22 DIAGNOSIS — R2681 Unsteadiness on feet: Secondary | ICD-10-CM | POA: Diagnosis not present

## 2020-10-23 DIAGNOSIS — R2681 Unsteadiness on feet: Secondary | ICD-10-CM | POA: Diagnosis not present

## 2020-10-23 DIAGNOSIS — R197 Diarrhea, unspecified: Secondary | ICD-10-CM | POA: Diagnosis not present

## 2020-10-23 DIAGNOSIS — M6281 Muscle weakness (generalized): Secondary | ICD-10-CM | POA: Diagnosis not present

## 2020-10-23 DIAGNOSIS — R112 Nausea with vomiting, unspecified: Secondary | ICD-10-CM | POA: Diagnosis not present

## 2020-10-25 DIAGNOSIS — M6281 Muscle weakness (generalized): Secondary | ICD-10-CM | POA: Diagnosis not present

## 2020-10-25 DIAGNOSIS — R2681 Unsteadiness on feet: Secondary | ICD-10-CM | POA: Diagnosis not present

## 2020-10-26 DIAGNOSIS — R2681 Unsteadiness on feet: Secondary | ICD-10-CM | POA: Diagnosis not present

## 2020-10-26 DIAGNOSIS — M6281 Muscle weakness (generalized): Secondary | ICD-10-CM | POA: Diagnosis not present

## 2020-10-27 DIAGNOSIS — R2681 Unsteadiness on feet: Secondary | ICD-10-CM | POA: Diagnosis not present

## 2020-10-27 DIAGNOSIS — M6281 Muscle weakness (generalized): Secondary | ICD-10-CM | POA: Diagnosis not present

## 2020-10-31 DIAGNOSIS — R69 Illness, unspecified: Secondary | ICD-10-CM | POA: Diagnosis not present

## 2020-10-31 DIAGNOSIS — F064 Anxiety disorder due to known physiological condition: Secondary | ICD-10-CM | POA: Diagnosis not present

## 2020-10-31 DIAGNOSIS — R2681 Unsteadiness on feet: Secondary | ICD-10-CM | POA: Diagnosis not present

## 2020-10-31 DIAGNOSIS — M6281 Muscle weakness (generalized): Secondary | ICD-10-CM | POA: Diagnosis not present

## 2020-11-01 DIAGNOSIS — M6281 Muscle weakness (generalized): Secondary | ICD-10-CM | POA: Diagnosis not present

## 2020-11-01 DIAGNOSIS — R2681 Unsteadiness on feet: Secondary | ICD-10-CM | POA: Diagnosis not present

## 2020-11-02 DIAGNOSIS — R2681 Unsteadiness on feet: Secondary | ICD-10-CM | POA: Diagnosis not present

## 2020-11-02 DIAGNOSIS — M6281 Muscle weakness (generalized): Secondary | ICD-10-CM | POA: Diagnosis not present

## 2020-11-03 DIAGNOSIS — R2681 Unsteadiness on feet: Secondary | ICD-10-CM | POA: Diagnosis not present

## 2020-11-03 DIAGNOSIS — M6281 Muscle weakness (generalized): Secondary | ICD-10-CM | POA: Diagnosis not present

## 2020-11-05 DIAGNOSIS — R2681 Unsteadiness on feet: Secondary | ICD-10-CM | POA: Diagnosis not present

## 2020-11-05 DIAGNOSIS — M6281 Muscle weakness (generalized): Secondary | ICD-10-CM | POA: Diagnosis not present

## 2020-11-06 DIAGNOSIS — M6281 Muscle weakness (generalized): Secondary | ICD-10-CM | POA: Diagnosis not present

## 2020-11-06 DIAGNOSIS — R2681 Unsteadiness on feet: Secondary | ICD-10-CM | POA: Diagnosis not present

## 2020-11-07 DIAGNOSIS — R2681 Unsteadiness on feet: Secondary | ICD-10-CM | POA: Diagnosis not present

## 2020-11-07 DIAGNOSIS — M6281 Muscle weakness (generalized): Secondary | ICD-10-CM | POA: Diagnosis not present

## 2020-11-09 DIAGNOSIS — M6281 Muscle weakness (generalized): Secondary | ICD-10-CM | POA: Diagnosis not present

## 2020-11-09 DIAGNOSIS — R2681 Unsteadiness on feet: Secondary | ICD-10-CM | POA: Diagnosis not present

## 2020-11-11 DIAGNOSIS — R2681 Unsteadiness on feet: Secondary | ICD-10-CM | POA: Diagnosis not present

## 2020-11-11 DIAGNOSIS — M6281 Muscle weakness (generalized): Secondary | ICD-10-CM | POA: Diagnosis not present

## 2020-11-12 DIAGNOSIS — M6281 Muscle weakness (generalized): Secondary | ICD-10-CM | POA: Diagnosis not present

## 2020-11-12 DIAGNOSIS — R2681 Unsteadiness on feet: Secondary | ICD-10-CM | POA: Diagnosis not present

## 2020-11-13 DIAGNOSIS — M6281 Muscle weakness (generalized): Secondary | ICD-10-CM | POA: Diagnosis not present

## 2020-11-13 DIAGNOSIS — R2681 Unsteadiness on feet: Secondary | ICD-10-CM | POA: Diagnosis not present

## 2020-11-15 DIAGNOSIS — M6281 Muscle weakness (generalized): Secondary | ICD-10-CM | POA: Diagnosis not present

## 2020-11-15 DIAGNOSIS — R2681 Unsteadiness on feet: Secondary | ICD-10-CM | POA: Diagnosis not present

## 2020-11-16 DIAGNOSIS — R2681 Unsteadiness on feet: Secondary | ICD-10-CM | POA: Diagnosis not present

## 2020-11-16 DIAGNOSIS — M6281 Muscle weakness (generalized): Secondary | ICD-10-CM | POA: Diagnosis not present

## 2020-11-17 DIAGNOSIS — M6281 Muscle weakness (generalized): Secondary | ICD-10-CM | POA: Diagnosis not present

## 2020-11-17 DIAGNOSIS — R2681 Unsteadiness on feet: Secondary | ICD-10-CM | POA: Diagnosis not present

## 2020-11-19 DIAGNOSIS — M6281 Muscle weakness (generalized): Secondary | ICD-10-CM | POA: Diagnosis not present

## 2020-11-19 DIAGNOSIS — R2681 Unsteadiness on feet: Secondary | ICD-10-CM | POA: Diagnosis not present

## 2020-11-20 DIAGNOSIS — M6281 Muscle weakness (generalized): Secondary | ICD-10-CM | POA: Diagnosis not present

## 2020-11-20 DIAGNOSIS — R2681 Unsteadiness on feet: Secondary | ICD-10-CM | POA: Diagnosis not present

## 2020-11-21 ENCOUNTER — Telehealth: Payer: Self-pay

## 2020-11-21 DIAGNOSIS — M6281 Muscle weakness (generalized): Secondary | ICD-10-CM | POA: Diagnosis not present

## 2020-11-21 DIAGNOSIS — R2681 Unsteadiness on feet: Secondary | ICD-10-CM | POA: Diagnosis not present
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
## 2020-11-23 DIAGNOSIS — F064 Anxiety disorder due to known physiological condition: Secondary | ICD-10-CM | POA: Diagnosis not present

## 2020-11-23 DIAGNOSIS — M6281 Muscle weakness (generalized): Secondary | ICD-10-CM | POA: Diagnosis not present

## 2020-11-23 DIAGNOSIS — R2681 Unsteadiness on feet: Secondary | ICD-10-CM | POA: Diagnosis not present

## 2020-11-23 DIAGNOSIS — R69 Illness, unspecified: Secondary | ICD-10-CM | POA: Diagnosis not present

## 2020-11-28 DIAGNOSIS — R69 Illness, unspecified: Secondary | ICD-10-CM | POA: Diagnosis not present

## 2020-11-28 DIAGNOSIS — F064 Anxiety disorder due to known physiological condition: Secondary | ICD-10-CM | POA: Diagnosis not present

## 2020-12-04 DIAGNOSIS — F064 Anxiety disorder due to known physiological condition: Secondary | ICD-10-CM | POA: Diagnosis not present

## 2020-12-04 DIAGNOSIS — R69 Illness, unspecified: Secondary | ICD-10-CM | POA: Diagnosis not present

## 2020-12-11 DIAGNOSIS — R69 Illness, unspecified: Secondary | ICD-10-CM | POA: Diagnosis not present

## 2020-12-11 DIAGNOSIS — F064 Anxiety disorder due to known physiological condition: Secondary | ICD-10-CM | POA: Diagnosis not present

## 2020-12-18 DIAGNOSIS — F064 Anxiety disorder due to known physiological condition: Secondary | ICD-10-CM | POA: Diagnosis not present

## 2020-12-18 DIAGNOSIS — R69 Illness, unspecified: Secondary | ICD-10-CM | POA: Diagnosis not present

## 2020-12-26 DIAGNOSIS — F064 Anxiety disorder due to known physiological condition: Secondary | ICD-10-CM | POA: Diagnosis not present

## 2020-12-26 DIAGNOSIS — R69 Illness, unspecified: Secondary | ICD-10-CM | POA: Diagnosis not present

## 2021-01-01 DIAGNOSIS — I119 Hypertensive heart disease without heart failure: Secondary | ICD-10-CM | POA: Diagnosis not present

## 2021-01-01 DIAGNOSIS — R69 Illness, unspecified: Secondary | ICD-10-CM | POA: Diagnosis not present

## 2021-01-01 DIAGNOSIS — M859 Disorder of bone density and structure, unspecified: Secondary | ICD-10-CM | POA: Diagnosis not present

## 2021-01-01 DIAGNOSIS — R3981 Functional urinary incontinence: Secondary | ICD-10-CM | POA: Diagnosis not present

## 2021-01-08 DIAGNOSIS — I119 Hypertensive heart disease without heart failure: Secondary | ICD-10-CM | POA: Diagnosis not present

## 2021-01-08 DIAGNOSIS — E785 Hyperlipidemia, unspecified: Secondary | ICD-10-CM | POA: Diagnosis not present

## 2021-01-08 DIAGNOSIS — R69 Illness, unspecified: Secondary | ICD-10-CM | POA: Diagnosis not present

## 2021-01-09 DIAGNOSIS — R69 Illness, unspecified: Secondary | ICD-10-CM | POA: Diagnosis not present

## 2021-01-09 DIAGNOSIS — F064 Anxiety disorder due to known physiological condition: Secondary | ICD-10-CM | POA: Diagnosis not present

## 2021-01-24 DIAGNOSIS — E119 Type 2 diabetes mellitus without complications: Secondary | ICD-10-CM | POA: Diagnosis not present

## 2021-02-19 DIAGNOSIS — R2681 Unsteadiness on feet: Secondary | ICD-10-CM | POA: Diagnosis not present

## 2021-02-19 DIAGNOSIS — M6281 Muscle weakness (generalized): Secondary | ICD-10-CM | POA: Diagnosis not present

## 2021-02-19 DIAGNOSIS — M1990 Unspecified osteoarthritis, unspecified site: Secondary | ICD-10-CM | POA: Diagnosis not present

## 2021-02-22 ENCOUNTER — Telehealth: Payer: Self-pay

## 2021-02-22 NOTE — Chronic Care Management (AMB) (Signed)
    Chronic Care Management Pharmacy Assistant   Name: TAMBRIA PFANNENSTIEL  MRN: [id number] DOB: Nov 29, 1935    Reason for Encounter: Reschedule Missed CCM Appointment   Medications: Outpatient Encounter Medications as of 02/22/2021  Medication Sig  . Accu-Chek Softclix Lancets lancets USE 1  TO CHECK GLUCOSE TWICE DAILY AND  AS  NEEDED  . acetaminophen (TYLENOL) 500 MG tablet Take 500 mg by mouth every 6 (six) hours as needed.    Marland Kitchen aspirin EC 81 MG tablet Take 81 mg by mouth daily.  . Blood Glucose Monitoring Suppl (ONETOUCH VERIO) w/Device KIT 1 Device by Other route 2 (two) times daily. **ONETOUCH VERIO** Use to check blood sugar twice daily for DM (dx. E11.65)  . Cholecalciferol (VITAMIN D) 2000 UNITS CAPS Take 2,000 Units by mouth daily.   . citalopram (CELEXA) 20 MG tablet Take 1 tablet by mouth in the evening  . conjugated estrogens (PREMARIN) vaginal cream Apply 0.$RemoveBefor'5mg'[id number]$  (pea-sized amount)  just inside the vaginal introitus with a finger-tip on  Monday, Wednesday and Friday nights.  . cyanocobalamin 1000 MCG tablet Take 1,000 mcg by mouth daily.  . famotidine (PEPCID) 40 MG tablet Take 1 tablet (40 mg total) by mouth daily. For acid reflux  . glipiZIDE (GLUCOTROL) 10 MG tablet Take 1 tablet (10 mg total) by mouth 2 (two) times daily before a meal.  . glucose blood (ONETOUCH VERIO) test strip USE 1 STRIP TO CHECK GLUCOSE TWICE DAILY (DX.  E11.65)  . lisinopril (ZESTRIL) 40 MG tablet Take 1 tablet by mouth once daily  . metFORMIN (GLUCOPHAGE) 1000 MG tablet TAKE 1 TABLET BY MOUTH TWICE DAILY WITH A MEAL  . metoprolol succinate (TOPROL-XL) 50 MG 24 hr tablet TAKE 1 TABLET BY MOUTH ONCE DAILY **TAKE  WITH  OR  IMMEDIATELY  FOLLOWING  A  MEAL**  . simvastatin (ZOCOR) 20 MG tablet TAKE ONE TABLET BY MOUTH IN THE EVENING WITH  A  LOW  FAT  SNACK   No facility-administered encounter medications on file as of 02/22/2021.    [name] was contacted to schedule missed initial   telephone visit with Debbora Dus. Received busy signal each time I attempted to call.   Debbora Dus, CPP notified  Margaretmary Dys, [place] Pharmacy Assistant [phone]

## 2021-03-08 DIAGNOSIS — R5383 Other fatigue: Secondary | ICD-10-CM | POA: Diagnosis not present

## 2021-03-08 DIAGNOSIS — I10 Essential (primary) hypertension: Secondary | ICD-10-CM | POA: Diagnosis not present

## 2021-03-08 DIAGNOSIS — G894 Chronic pain syndrome: Secondary | ICD-10-CM | POA: Diagnosis not present

## 2021-03-09 DIAGNOSIS — R69 Illness, unspecified: Secondary | ICD-10-CM | POA: Diagnosis not present

## 2021-03-09 DIAGNOSIS — F32A Depression, unspecified: Secondary | ICD-10-CM | POA: Diagnosis not present

## 2021-03-15 DIAGNOSIS — E785 Hyperlipidemia, unspecified: Secondary | ICD-10-CM | POA: Diagnosis not present

## 2021-03-15 DIAGNOSIS — G894 Chronic pain syndrome: Secondary | ICD-10-CM | POA: Diagnosis not present

## 2021-03-15 DIAGNOSIS — K219 Gastro-esophageal reflux disease without esophagitis: Secondary | ICD-10-CM | POA: Diagnosis not present

## 2021-03-22 DIAGNOSIS — K5900 Constipation, unspecified: Secondary | ICD-10-CM | POA: Diagnosis not present

## 2021-03-22 DIAGNOSIS — G894 Chronic pain syndrome: Secondary | ICD-10-CM | POA: Diagnosis not present

## 2021-03-22 DIAGNOSIS — I429 Cardiomyopathy, unspecified: Secondary | ICD-10-CM | POA: Diagnosis not present

## 2021-03-22 DIAGNOSIS — R69 Illness, unspecified: Secondary | ICD-10-CM | POA: Diagnosis not present

## 2021-04-06 DIAGNOSIS — R4181 Age-related cognitive decline: Secondary | ICD-10-CM | POA: Diagnosis not present

## 2021-04-06 DIAGNOSIS — M179 Osteoarthritis of knee, unspecified: Secondary | ICD-10-CM | POA: Diagnosis not present

## 2021-04-06 DIAGNOSIS — M169 Osteoarthritis of hip, unspecified: Secondary | ICD-10-CM | POA: Diagnosis not present

## 2021-04-09 DIAGNOSIS — G309 Alzheimer's disease, unspecified: Secondary | ICD-10-CM | POA: Diagnosis not present

## 2021-04-09 DIAGNOSIS — L601 Onycholysis: Secondary | ICD-10-CM | POA: Diagnosis not present

## 2021-04-09 DIAGNOSIS — R69 Illness, unspecified: Secondary | ICD-10-CM | POA: Diagnosis not present

## 2021-04-09 DIAGNOSIS — R600 Localized edema: Secondary | ICD-10-CM | POA: Diagnosis not present

## 2021-04-10 DIAGNOSIS — R600 Localized edema: Secondary | ICD-10-CM | POA: Diagnosis not present

## 2021-04-24 DIAGNOSIS — K219 Gastro-esophageal reflux disease without esophagitis: Secondary | ICD-10-CM | POA: Diagnosis not present

## 2021-04-24 DIAGNOSIS — I10 Essential (primary) hypertension: Secondary | ICD-10-CM | POA: Diagnosis not present

## 2021-04-24 DIAGNOSIS — R5383 Other fatigue: Secondary | ICD-10-CM | POA: Diagnosis not present

## 2021-04-25 DIAGNOSIS — R413 Other amnesia: Secondary | ICD-10-CM | POA: Diagnosis not present

## 2021-04-25 DIAGNOSIS — G479 Sleep disorder, unspecified: Secondary | ICD-10-CM | POA: Diagnosis not present

## 2021-04-25 DIAGNOSIS — R69 Illness, unspecified: Secondary | ICD-10-CM | POA: Diagnosis not present

## 2021-04-26 DIAGNOSIS — B351 Tinea unguium: Secondary | ICD-10-CM | POA: Diagnosis not present

## 2021-04-26 DIAGNOSIS — E1151 Type 2 diabetes mellitus with diabetic peripheral angiopathy without gangrene: Secondary | ICD-10-CM | POA: Diagnosis not present

## 2021-04-27 DIAGNOSIS — F32A Depression, unspecified: Secondary | ICD-10-CM | POA: Diagnosis not present

## 2021-04-27 DIAGNOSIS — R69 Illness, unspecified: Secondary | ICD-10-CM | POA: Diagnosis not present

## 2021-05-02 DIAGNOSIS — E119 Type 2 diabetes mellitus without complications: Secondary | ICD-10-CM | POA: Diagnosis not present

## 2021-05-04 DIAGNOSIS — R350 Frequency of micturition: Secondary | ICD-10-CM | POA: Diagnosis not present

## 2021-05-04 DIAGNOSIS — R69 Illness, unspecified: Secondary | ICD-10-CM | POA: Diagnosis not present

## 2021-05-04 DIAGNOSIS — E871 Hypo-osmolality and hyponatremia: Secondary | ICD-10-CM | POA: Diagnosis not present

## 2021-05-04 DIAGNOSIS — R197 Diarrhea, unspecified: Secondary | ICD-10-CM | POA: Diagnosis not present

## 2021-05-04 DIAGNOSIS — F32A Depression, unspecified: Secondary | ICD-10-CM | POA: Diagnosis not present

## 2021-05-08 DIAGNOSIS — N390 Urinary tract infection, site not specified: Secondary | ICD-10-CM | POA: Diagnosis not present

## 2021-05-11 DIAGNOSIS — F32A Depression, unspecified: Secondary | ICD-10-CM | POA: Diagnosis not present

## 2021-05-11 DIAGNOSIS — R69 Illness, unspecified: Secondary | ICD-10-CM | POA: Diagnosis not present

## 2021-05-23 DIAGNOSIS — G894 Chronic pain syndrome: Secondary | ICD-10-CM | POA: Diagnosis not present

## 2021-05-23 DIAGNOSIS — I429 Cardiomyopathy, unspecified: Secondary | ICD-10-CM | POA: Diagnosis not present

## 2021-05-23 DIAGNOSIS — R69 Illness, unspecified: Secondary | ICD-10-CM | POA: Diagnosis not present

## 2021-05-23 DIAGNOSIS — M169 Osteoarthritis of hip, unspecified: Secondary | ICD-10-CM | POA: Diagnosis not present

## 2021-05-25 DIAGNOSIS — F32A Depression, unspecified: Secondary | ICD-10-CM | POA: Diagnosis not present

## 2021-05-25 DIAGNOSIS — R69 Illness, unspecified: Secondary | ICD-10-CM | POA: Diagnosis not present

## 2021-06-15 DIAGNOSIS — F32A Depression, unspecified: Secondary | ICD-10-CM | POA: Diagnosis not present

## 2021-06-15 DIAGNOSIS — F411 Generalized anxiety disorder: Secondary | ICD-10-CM | POA: Diagnosis not present

## 2021-06-15 DIAGNOSIS — R69 Illness, unspecified: Secondary | ICD-10-CM | POA: Diagnosis not present

## 2021-06-28 DIAGNOSIS — L603 Nail dystrophy: Secondary | ICD-10-CM | POA: Diagnosis not present

## 2021-06-28 DIAGNOSIS — Z7984 Long term (current) use of oral hypoglycemic drugs: Secondary | ICD-10-CM | POA: Diagnosis not present

## 2021-06-28 DIAGNOSIS — B351 Tinea unguium: Secondary | ICD-10-CM | POA: Diagnosis not present

## 2021-06-28 DIAGNOSIS — E1151 Type 2 diabetes mellitus with diabetic peripheral angiopathy without gangrene: Secondary | ICD-10-CM | POA: Diagnosis not present

## 2021-07-06 DIAGNOSIS — F32A Depression, unspecified: Secondary | ICD-10-CM | POA: Diagnosis not present

## 2021-07-06 DIAGNOSIS — F411 Generalized anxiety disorder: Secondary | ICD-10-CM | POA: Diagnosis not present

## 2021-07-06 DIAGNOSIS — R69 Illness, unspecified: Secondary | ICD-10-CM | POA: Diagnosis not present

## 2021-07-10 DIAGNOSIS — Z23 Encounter for immunization: Secondary | ICD-10-CM | POA: Diagnosis not present

## 2021-07-13 DIAGNOSIS — M25551 Pain in right hip: Secondary | ICD-10-CM | POA: Diagnosis not present

## 2021-07-13 DIAGNOSIS — M169 Osteoarthritis of hip, unspecified: Secondary | ICD-10-CM | POA: Diagnosis not present

## 2021-07-13 DIAGNOSIS — M1611 Unilateral primary osteoarthritis, right hip: Secondary | ICD-10-CM | POA: Diagnosis not present

## 2021-07-16 DIAGNOSIS — I10 Essential (primary) hypertension: Secondary | ICD-10-CM | POA: Diagnosis not present

## 2021-07-16 DIAGNOSIS — M169 Osteoarthritis of hip, unspecified: Secondary | ICD-10-CM | POA: Diagnosis not present

## 2021-07-16 DIAGNOSIS — E785 Hyperlipidemia, unspecified: Secondary | ICD-10-CM | POA: Diagnosis not present

## 2021-07-16 DIAGNOSIS — E118 Type 2 diabetes mellitus with unspecified complications: Secondary | ICD-10-CM | POA: Diagnosis not present

## 2021-07-20 DIAGNOSIS — F411 Generalized anxiety disorder: Secondary | ICD-10-CM | POA: Diagnosis not present

## 2021-07-20 DIAGNOSIS — R69 Illness, unspecified: Secondary | ICD-10-CM | POA: Diagnosis not present

## 2021-07-20 DIAGNOSIS — F32A Depression, unspecified: Secondary | ICD-10-CM | POA: Diagnosis not present

## 2021-07-26 DIAGNOSIS — R69 Illness, unspecified: Secondary | ICD-10-CM | POA: Diagnosis not present

## 2021-07-26 DIAGNOSIS — G479 Sleep disorder, unspecified: Secondary | ICD-10-CM | POA: Diagnosis not present

## 2021-07-26 DIAGNOSIS — R413 Other amnesia: Secondary | ICD-10-CM | POA: Diagnosis not present

## 2021-07-26 DIAGNOSIS — E119 Type 2 diabetes mellitus without complications: Secondary | ICD-10-CM | POA: Diagnosis not present

## 2021-07-26 DIAGNOSIS — R262 Difficulty in walking, not elsewhere classified: Secondary | ICD-10-CM | POA: Diagnosis not present

## 2021-08-01 DIAGNOSIS — M25551 Pain in right hip: Secondary | ICD-10-CM | POA: Diagnosis not present

## 2021-08-03 DIAGNOSIS — F411 Generalized anxiety disorder: Secondary | ICD-10-CM | POA: Diagnosis not present

## 2021-08-03 DIAGNOSIS — F32A Depression, unspecified: Secondary | ICD-10-CM | POA: Diagnosis not present

## 2021-08-03 DIAGNOSIS — R69 Illness, unspecified: Secondary | ICD-10-CM | POA: Diagnosis not present

## 2021-08-13 DIAGNOSIS — F411 Generalized anxiety disorder: Secondary | ICD-10-CM | POA: Diagnosis not present

## 2021-08-13 DIAGNOSIS — F32A Depression, unspecified: Secondary | ICD-10-CM | POA: Diagnosis not present

## 2021-08-13 DIAGNOSIS — R69 Illness, unspecified: Secondary | ICD-10-CM | POA: Diagnosis not present

## 2021-08-14 DIAGNOSIS — F330 Major depressive disorder, recurrent, mild: Secondary | ICD-10-CM | POA: Diagnosis not present

## 2021-08-14 DIAGNOSIS — I208 Other forms of angina pectoris: Secondary | ICD-10-CM | POA: Diagnosis not present

## 2021-08-14 DIAGNOSIS — R69 Illness, unspecified: Secondary | ICD-10-CM | POA: Diagnosis not present

## 2021-08-14 DIAGNOSIS — I5022 Chronic systolic (congestive) heart failure: Secondary | ICD-10-CM | POA: Diagnosis not present

## 2021-08-14 DIAGNOSIS — I110 Hypertensive heart disease with heart failure: Secondary | ICD-10-CM | POA: Diagnosis not present

## 2021-08-20 DIAGNOSIS — F411 Generalized anxiety disorder: Secondary | ICD-10-CM | POA: Diagnosis not present

## 2021-08-20 DIAGNOSIS — F32A Depression, unspecified: Secondary | ICD-10-CM | POA: Diagnosis not present

## 2021-08-20 DIAGNOSIS — R69 Illness, unspecified: Secondary | ICD-10-CM | POA: Diagnosis not present

## 2021-08-22 DIAGNOSIS — I10 Essential (primary) hypertension: Secondary | ICD-10-CM | POA: Diagnosis not present

## 2021-08-22 DIAGNOSIS — E785 Hyperlipidemia, unspecified: Secondary | ICD-10-CM | POA: Diagnosis not present

## 2021-08-22 DIAGNOSIS — I5022 Chronic systolic (congestive) heart failure: Secondary | ICD-10-CM | POA: Diagnosis not present

## 2021-08-22 DIAGNOSIS — I110 Hypertensive heart disease with heart failure: Secondary | ICD-10-CM | POA: Diagnosis not present

## 2021-09-14 DIAGNOSIS — R69 Illness, unspecified: Secondary | ICD-10-CM | POA: Diagnosis not present

## 2021-09-14 DIAGNOSIS — F411 Generalized anxiety disorder: Secondary | ICD-10-CM | POA: Diagnosis not present

## 2021-09-14 DIAGNOSIS — F32A Depression, unspecified: Secondary | ICD-10-CM | POA: Diagnosis not present

## 2021-10-05 DIAGNOSIS — F32A Depression, unspecified: Secondary | ICD-10-CM | POA: Diagnosis not present

## 2021-10-05 DIAGNOSIS — R69 Illness, unspecified: Secondary | ICD-10-CM | POA: Diagnosis not present

## 2021-10-05 DIAGNOSIS — F411 Generalized anxiety disorder: Secondary | ICD-10-CM | POA: Diagnosis not present

## 2021-10-08 DIAGNOSIS — F32A Depression, unspecified: Secondary | ICD-10-CM | POA: Diagnosis not present

## 2021-10-08 DIAGNOSIS — F411 Generalized anxiety disorder: Secondary | ICD-10-CM | POA: Diagnosis not present

## 2021-10-08 DIAGNOSIS — R69 Illness, unspecified: Secondary | ICD-10-CM | POA: Diagnosis not present

## 2021-10-22 DIAGNOSIS — I5022 Chronic systolic (congestive) heart failure: Secondary | ICD-10-CM | POA: Diagnosis not present

## 2021-10-22 DIAGNOSIS — I10 Essential (primary) hypertension: Secondary | ICD-10-CM | POA: Diagnosis not present

## 2021-10-22 DIAGNOSIS — E1165 Type 2 diabetes mellitus with hyperglycemia: Secondary | ICD-10-CM | POA: Diagnosis not present

## 2021-10-22 DIAGNOSIS — E785 Hyperlipidemia, unspecified: Secondary | ICD-10-CM | POA: Diagnosis not present

## 2021-10-22 DIAGNOSIS — R69 Illness, unspecified: Secondary | ICD-10-CM | POA: Diagnosis not present

## 2021-10-22 DIAGNOSIS — D519 Vitamin B12 deficiency anemia, unspecified: Secondary | ICD-10-CM | POA: Diagnosis not present

## 2021-10-22 DIAGNOSIS — K219 Gastro-esophageal reflux disease without esophagitis: Secondary | ICD-10-CM | POA: Diagnosis not present

## 2021-10-23 DIAGNOSIS — I10 Essential (primary) hypertension: Secondary | ICD-10-CM | POA: Diagnosis not present

## 2021-10-26 DIAGNOSIS — E559 Vitamin D deficiency, unspecified: Secondary | ICD-10-CM | POA: Diagnosis not present

## 2021-10-26 DIAGNOSIS — D519 Vitamin B12 deficiency anemia, unspecified: Secondary | ICD-10-CM | POA: Diagnosis not present

## 2021-11-02 DIAGNOSIS — F411 Generalized anxiety disorder: Secondary | ICD-10-CM | POA: Diagnosis not present

## 2021-11-02 DIAGNOSIS — F32A Depression, unspecified: Secondary | ICD-10-CM | POA: Diagnosis not present

## 2021-11-02 DIAGNOSIS — R69 Illness, unspecified: Secondary | ICD-10-CM | POA: Diagnosis not present

## 2021-11-26 DIAGNOSIS — E785 Hyperlipidemia, unspecified: Secondary | ICD-10-CM | POA: Diagnosis not present

## 2021-11-26 DIAGNOSIS — R69 Illness, unspecified: Secondary | ICD-10-CM | POA: Diagnosis not present

## 2021-11-26 DIAGNOSIS — E1165 Type 2 diabetes mellitus with hyperglycemia: Secondary | ICD-10-CM | POA: Diagnosis not present

## 2021-11-26 DIAGNOSIS — I10 Essential (primary) hypertension: Secondary | ICD-10-CM | POA: Diagnosis not present

## 2021-11-26 DIAGNOSIS — I5022 Chronic systolic (congestive) heart failure: Secondary | ICD-10-CM | POA: Diagnosis not present

## 2021-11-26 DIAGNOSIS — F330 Major depressive disorder, recurrent, mild: Secondary | ICD-10-CM | POA: Diagnosis not present

## 2021-11-30 DIAGNOSIS — I10 Essential (primary) hypertension: Secondary | ICD-10-CM | POA: Diagnosis not present

## 2021-11-30 DIAGNOSIS — D519 Vitamin B12 deficiency anemia, unspecified: Secondary | ICD-10-CM | POA: Diagnosis not present

## 2021-11-30 DIAGNOSIS — E119 Type 2 diabetes mellitus without complications: Secondary | ICD-10-CM | POA: Diagnosis not present

## 2021-11-30 DIAGNOSIS — E559 Vitamin D deficiency, unspecified: Secondary | ICD-10-CM | POA: Diagnosis not present

## 2021-12-11 DIAGNOSIS — E1151 Type 2 diabetes mellitus with diabetic peripheral angiopathy without gangrene: Secondary | ICD-10-CM | POA: Diagnosis not present

## 2021-12-11 DIAGNOSIS — L603 Nail dystrophy: Secondary | ICD-10-CM | POA: Diagnosis not present

## 2021-12-11 DIAGNOSIS — B351 Tinea unguium: Secondary | ICD-10-CM | POA: Diagnosis not present

## 2021-12-11 DIAGNOSIS — Z7984 Long term (current) use of oral hypoglycemic drugs: Secondary | ICD-10-CM | POA: Diagnosis not present

## 2021-12-17 DIAGNOSIS — R69 Illness, unspecified: Secondary | ICD-10-CM | POA: Diagnosis not present

## 2021-12-17 DIAGNOSIS — F32A Depression, unspecified: Secondary | ICD-10-CM | POA: Diagnosis not present

## 2021-12-17 DIAGNOSIS — F411 Generalized anxiety disorder: Secondary | ICD-10-CM | POA: Diagnosis not present

## 2021-12-26 DIAGNOSIS — F32A Depression, unspecified: Secondary | ICD-10-CM | POA: Diagnosis not present

## 2021-12-26 DIAGNOSIS — R69 Illness, unspecified: Secondary | ICD-10-CM | POA: Diagnosis not present

## 2021-12-26 DIAGNOSIS — F411 Generalized anxiety disorder: Secondary | ICD-10-CM | POA: Diagnosis not present

## 2022-07-25 ENCOUNTER — Other Ambulatory Visit: Payer: Self-pay | Admitting: Family Medicine

## 2022-07-29 ENCOUNTER — Other Ambulatory Visit: Payer: Self-pay | Admitting: Gerontology

## 2022-07-29 DIAGNOSIS — N644 Mastodynia: Secondary | ICD-10-CM

## 2022-08-09 ENCOUNTER — Other Ambulatory Visit: Payer: Self-pay | Admitting: *Deleted

## 2022-08-09 ENCOUNTER — Inpatient Hospital Stay
Admission: RE | Admit: 2022-08-09 | Discharge: 2022-08-09 | Disposition: A | Discharge status: Home / Self Care | Payer: Self-pay | Source: Ambulatory Visit | Attending: *Deleted | Admitting: *Deleted

## 2022-08-09 DIAGNOSIS — Z1231 Encounter for screening mammogram for malignant neoplasm of breast: Secondary | ICD-10-CM

## 2022-08-28 ENCOUNTER — Ambulatory Visit
Admission: RE | Admit: 2022-08-28 | Discharge: 2022-08-28 | Disposition: A | Discharge status: Home / Self Care | Payer: Medicare Other | Source: Ambulatory Visit | Attending: Gerontology | Admitting: Gerontology

## 2022-08-28 ENCOUNTER — Encounter: Payer: Self-pay | Admitting: Gerontology

## 2022-08-28 DIAGNOSIS — N644 Mastodynia: Secondary | ICD-10-CM | POA: Diagnosis present

## 2022-09-05 ENCOUNTER — Other Ambulatory Visit: Payer: Self-pay | Admitting: Gerontology

## 2022-09-05 DIAGNOSIS — R928 Other abnormal and inconclusive findings on diagnostic imaging of breast: Secondary | ICD-10-CM

## 2022-09-18 ENCOUNTER — Ambulatory Visit
Admission: RE | Admit: 2022-09-18 | Discharge: 2022-09-18 | Disposition: A | Discharge status: Home / Self Care | Payer: Medicare Other | Source: Ambulatory Visit | Attending: Gerontology | Admitting: Gerontology

## 2022-09-18 DIAGNOSIS — R928 Other abnormal and inconclusive findings on diagnostic imaging of breast: Secondary | ICD-10-CM | POA: Diagnosis present

## 2022-09-18 HISTORY — PX: BREAST BIOPSY: SHX20

## 2022-09-18 MED ORDER — LIDOCAINE HCL (PF) 1 % IJ SOLN
20.0000 mL | Freq: Once | INTRAMUSCULAR | Status: AC
  Administered 2022-09-18: 20 mL
  Filled 2022-09-18: qty 20

## 2022-09-18 MED ORDER — LIDOCAINE-EPINEPHRINE 1 %-1:100000 IJ SOLN
10.0000 mL | Freq: Once | INTRAMUSCULAR | Status: AC
  Administered 2022-09-18: 10 mL
  Filled 2022-09-18: qty 10

## 2022-09-19 LAB — SURGICAL PATHOLOGY

## 2023-01-16 ENCOUNTER — Other Ambulatory Visit: Payer: Self-pay | Admitting: Adult Health Nurse Practitioner

## 2023-01-16 DIAGNOSIS — N6489 Other specified disorders of breast: Secondary | ICD-10-CM

## 2023-02-13 ENCOUNTER — Ambulatory Visit
Admission: RE | Admit: 2023-02-13 | Discharge: 2023-02-13 | Disposition: A | Discharge status: Home / Self Care | Payer: Medicare Other | Source: Ambulatory Visit | Attending: Adult Health Nurse Practitioner | Admitting: Adult Health Nurse Practitioner

## 2023-02-13 DIAGNOSIS — N6489 Other specified disorders of breast: Secondary | ICD-10-CM | POA: Diagnosis not present

## 2023-05-22 ENCOUNTER — Other Ambulatory Visit: Payer: Self-pay | Admitting: Family Medicine

## 2023-05-22 DIAGNOSIS — Z1231 Encounter for screening mammogram for malignant neoplasm of breast: Secondary | ICD-10-CM

## 2023-09-02 ENCOUNTER — Ambulatory Visit
Admission: RE | Admit: 2023-09-02 | Discharge: 2023-09-02 | Disposition: A | Discharge status: Home / Self Care | Payer: Medicare Other | Source: Ambulatory Visit | Attending: Family Medicine | Admitting: Family Medicine

## 2023-09-02 DIAGNOSIS — Z1231 Encounter for screening mammogram for malignant neoplasm of breast: Secondary | ICD-10-CM | POA: Insufficient documentation

## 2023-12-23 ENCOUNTER — Emergency Department

## 2023-12-23 ENCOUNTER — Other Ambulatory Visit: Payer: Self-pay

## 2023-12-23 ENCOUNTER — Observation Stay

## 2023-12-23 ENCOUNTER — Inpatient Hospital Stay
Admission: EM | Admit: 2023-12-23 | Discharge: 2024-01-01 | DRG: 871 | Disposition: A | Discharge status: Skilled Nursing Facility | Source: Skilled Nursing Facility | Attending: Internal Medicine | Admitting: Internal Medicine

## 2023-12-23 DIAGNOSIS — Z86000 Personal history of in-situ neoplasm of breast: Secondary | ICD-10-CM

## 2023-12-23 DIAGNOSIS — Z8542 Personal history of malignant neoplasm of other parts of uterus: Secondary | ICD-10-CM

## 2023-12-23 DIAGNOSIS — Z7951 Long term (current) use of inhaled steroids: Secondary | ICD-10-CM

## 2023-12-23 DIAGNOSIS — Z7901 Long term (current) use of anticoagulants: Secondary | ICD-10-CM

## 2023-12-23 DIAGNOSIS — W182XXA Fall in (into) shower or empty bathtub, initial encounter: Secondary | ICD-10-CM | POA: Diagnosis present

## 2023-12-23 DIAGNOSIS — E876 Hypokalemia: Secondary | ICD-10-CM | POA: Diagnosis present

## 2023-12-23 DIAGNOSIS — Z794 Long term (current) use of insulin: Secondary | ICD-10-CM

## 2023-12-23 DIAGNOSIS — J101 Influenza due to other identified influenza virus with other respiratory manifestations: Secondary | ICD-10-CM | POA: Diagnosis not present

## 2023-12-23 DIAGNOSIS — R0902 Hypoxemia: Principal | ICD-10-CM

## 2023-12-23 DIAGNOSIS — I2489 Other forms of acute ischemic heart disease: Secondary | ICD-10-CM | POA: Diagnosis present

## 2023-12-23 DIAGNOSIS — Y93E1 Activity, personal bathing and showering: Secondary | ICD-10-CM

## 2023-12-23 DIAGNOSIS — R7989 Other specified abnormal findings of blood chemistry: Secondary | ICD-10-CM | POA: Diagnosis present

## 2023-12-23 DIAGNOSIS — I4891 Unspecified atrial fibrillation: Secondary | ICD-10-CM

## 2023-12-23 DIAGNOSIS — Z801 Family history of malignant neoplasm of trachea, bronchus and lung: Secondary | ICD-10-CM

## 2023-12-23 DIAGNOSIS — E785 Hyperlipidemia, unspecified: Secondary | ICD-10-CM

## 2023-12-23 DIAGNOSIS — R0602 Shortness of breath: Secondary | ICD-10-CM

## 2023-12-23 DIAGNOSIS — R5381 Other malaise: Secondary | ICD-10-CM | POA: Diagnosis present

## 2023-12-23 DIAGNOSIS — E663 Overweight: Secondary | ICD-10-CM | POA: Diagnosis present

## 2023-12-23 DIAGNOSIS — I480 Paroxysmal atrial fibrillation: Secondary | ICD-10-CM | POA: Diagnosis present

## 2023-12-23 DIAGNOSIS — E1169 Type 2 diabetes mellitus with other specified complication: Secondary | ICD-10-CM | POA: Diagnosis present

## 2023-12-23 DIAGNOSIS — M8580 Other specified disorders of bone density and structure, unspecified site: Secondary | ICD-10-CM | POA: Diagnosis present

## 2023-12-23 DIAGNOSIS — A4189 Other specified sepsis: Secondary | ICD-10-CM | POA: Diagnosis not present

## 2023-12-23 DIAGNOSIS — L02413 Cutaneous abscess of right upper limb: Secondary | ICD-10-CM | POA: Diagnosis not present

## 2023-12-23 DIAGNOSIS — Z1152 Encounter for screening for COVID-19: Secondary | ICD-10-CM

## 2023-12-23 DIAGNOSIS — Z6829 Body mass index (BMI) 29.0-29.9, adult: Secondary | ICD-10-CM

## 2023-12-23 DIAGNOSIS — I4892 Unspecified atrial flutter: Secondary | ICD-10-CM | POA: Diagnosis present

## 2023-12-23 DIAGNOSIS — R296 Repeated falls: Secondary | ICD-10-CM | POA: Diagnosis present

## 2023-12-23 DIAGNOSIS — J121 Respiratory syncytial virus pneumonia: Secondary | ICD-10-CM | POA: Diagnosis present

## 2023-12-23 DIAGNOSIS — A419 Sepsis, unspecified organism: Secondary | ICD-10-CM

## 2023-12-23 DIAGNOSIS — I5022 Chronic systolic (congestive) heart failure: Secondary | ICD-10-CM | POA: Diagnosis not present

## 2023-12-23 DIAGNOSIS — Z888 Allergy status to other drugs, medicaments and biological substances status: Secondary | ICD-10-CM

## 2023-12-23 DIAGNOSIS — E782 Mixed hyperlipidemia: Secondary | ICD-10-CM | POA: Diagnosis present

## 2023-12-23 DIAGNOSIS — E860 Dehydration: Secondary | ICD-10-CM | POA: Diagnosis present

## 2023-12-23 DIAGNOSIS — E871 Hypo-osmolality and hyponatremia: Secondary | ICD-10-CM | POA: Diagnosis present

## 2023-12-23 DIAGNOSIS — I808 Phlebitis and thrombophlebitis of other sites: Secondary | ICD-10-CM | POA: Diagnosis not present

## 2023-12-23 DIAGNOSIS — D509 Iron deficiency anemia, unspecified: Secondary | ICD-10-CM | POA: Diagnosis present

## 2023-12-23 DIAGNOSIS — D689 Coagulation defect, unspecified: Secondary | ICD-10-CM | POA: Diagnosis present

## 2023-12-23 DIAGNOSIS — K219 Gastro-esophageal reflux disease without esophagitis: Secondary | ICD-10-CM | POA: Diagnosis present

## 2023-12-23 DIAGNOSIS — L299 Pruritus, unspecified: Secondary | ICD-10-CM | POA: Diagnosis present

## 2023-12-23 DIAGNOSIS — E119 Type 2 diabetes mellitus without complications: Secondary | ICD-10-CM

## 2023-12-23 DIAGNOSIS — Z90710 Acquired absence of both cervix and uterus: Secondary | ICD-10-CM

## 2023-12-23 DIAGNOSIS — J210 Acute bronchiolitis due to respiratory syncytial virus: Secondary | ICD-10-CM | POA: Diagnosis not present

## 2023-12-23 DIAGNOSIS — E1165 Type 2 diabetes mellitus with hyperglycemia: Secondary | ICD-10-CM | POA: Diagnosis present

## 2023-12-23 DIAGNOSIS — R32 Unspecified urinary incontinence: Secondary | ICD-10-CM | POA: Diagnosis present

## 2023-12-23 DIAGNOSIS — D72829 Elevated white blood cell count, unspecified: Secondary | ICD-10-CM | POA: Diagnosis present

## 2023-12-23 DIAGNOSIS — J1008 Influenza due to other identified influenza virus with other specified pneumonia: Secondary | ICD-10-CM | POA: Diagnosis present

## 2023-12-23 DIAGNOSIS — Y848 Other medical procedures as the cause of abnormal reaction of the patient, or of later complication, without mention of misadventure at the time of the procedure: Secondary | ICD-10-CM | POA: Diagnosis not present

## 2023-12-23 DIAGNOSIS — I429 Cardiomyopathy, unspecified: Secondary | ICD-10-CM | POA: Diagnosis present

## 2023-12-23 DIAGNOSIS — Z8051 Family history of malignant neoplasm of kidney: Secondary | ICD-10-CM

## 2023-12-23 DIAGNOSIS — M1990 Unspecified osteoarthritis, unspecified site: Secondary | ICD-10-CM | POA: Diagnosis present

## 2023-12-23 DIAGNOSIS — I5023 Acute on chronic systolic (congestive) heart failure: Secondary | ICD-10-CM | POA: Diagnosis present

## 2023-12-23 DIAGNOSIS — Z7984 Long term (current) use of oral hypoglycemic drugs: Secondary | ICD-10-CM

## 2023-12-23 DIAGNOSIS — Z8711 Personal history of peptic ulcer disease: Secondary | ICD-10-CM

## 2023-12-23 DIAGNOSIS — J9601 Acute respiratory failure with hypoxia: Secondary | ICD-10-CM | POA: Diagnosis present

## 2023-12-23 DIAGNOSIS — Z803 Family history of malignant neoplasm of breast: Secondary | ICD-10-CM

## 2023-12-23 DIAGNOSIS — R197 Diarrhea, unspecified: Secondary | ICD-10-CM | POA: Diagnosis present

## 2023-12-23 DIAGNOSIS — I5043 Acute on chronic combined systolic (congestive) and diastolic (congestive) heart failure: Secondary | ICD-10-CM | POA: Diagnosis not present

## 2023-12-23 DIAGNOSIS — Y828 Other medical devices associated with adverse incidents: Secondary | ICD-10-CM | POA: Diagnosis not present

## 2023-12-23 DIAGNOSIS — Z79899 Other long term (current) drug therapy: Secondary | ICD-10-CM

## 2023-12-23 DIAGNOSIS — Z9889 Other specified postprocedural states: Secondary | ICD-10-CM

## 2023-12-23 DIAGNOSIS — Z853 Personal history of malignant neoplasm of breast: Secondary | ICD-10-CM

## 2023-12-23 DIAGNOSIS — Z8249 Family history of ischemic heart disease and other diseases of the circulatory system: Secondary | ICD-10-CM

## 2023-12-23 DIAGNOSIS — Y92239 Unspecified place in hospital as the place of occurrence of the external cause: Secondary | ICD-10-CM | POA: Diagnosis not present

## 2023-12-23 DIAGNOSIS — E872 Acidosis, unspecified: Secondary | ICD-10-CM | POA: Diagnosis present

## 2023-12-23 DIAGNOSIS — R630 Anorexia: Secondary | ICD-10-CM | POA: Diagnosis present

## 2023-12-23 DIAGNOSIS — Z87891 Personal history of nicotine dependence: Secondary | ICD-10-CM

## 2023-12-23 DIAGNOSIS — Z7982 Long term (current) use of aspirin: Secondary | ICD-10-CM

## 2023-12-23 DIAGNOSIS — I10 Essential (primary) hypertension: Secondary | ICD-10-CM | POA: Diagnosis present

## 2023-12-23 DIAGNOSIS — R011 Cardiac murmur, unspecified: Secondary | ICD-10-CM | POA: Diagnosis present

## 2023-12-23 DIAGNOSIS — I959 Hypotension, unspecified: Secondary | ICD-10-CM | POA: Diagnosis not present

## 2023-12-23 DIAGNOSIS — I110 Hypertensive heart disease with heart failure: Secondary | ICD-10-CM | POA: Diagnosis present

## 2023-12-23 DIAGNOSIS — I493 Ventricular premature depolarization: Secondary | ICD-10-CM | POA: Diagnosis not present

## 2023-12-23 DIAGNOSIS — N179 Acute kidney failure, unspecified: Secondary | ICD-10-CM | POA: Diagnosis present

## 2023-12-23 DIAGNOSIS — F32A Depression, unspecified: Secondary | ICD-10-CM | POA: Diagnosis present

## 2023-12-23 DIAGNOSIS — L02419 Cutaneous abscess of limb, unspecified: Secondary | ICD-10-CM

## 2023-12-23 DIAGNOSIS — Z8541 Personal history of malignant neoplasm of cervix uteri: Secondary | ICD-10-CM

## 2023-12-23 LAB — CBC WITH DIFFERENTIAL/PLATELET
Abs Immature Granulocytes: 0.04 10*3/uL (ref 0.00–0.07)
Basophils Absolute: 0 10*3/uL (ref 0.0–0.1)
Basophils Relative: 0 %
Eosinophils Absolute: 0.1 10*3/uL (ref 0.0–0.5)
Eosinophils Relative: 1 %
HCT: 29.3 % — ABNORMAL LOW (ref 36.0–46.0)
Hemoglobin: 9.3 g/dL — ABNORMAL LOW (ref 12.0–15.0)
Immature Granulocytes: 1 %
Lymphocytes Relative: 8 %
Lymphs Abs: 0.6 10*3/uL — ABNORMAL LOW (ref 0.7–4.0)
MCH: 24.2 pg — ABNORMAL LOW (ref 26.0–34.0)
MCHC: 31.7 g/dL (ref 30.0–36.0)
MCV: 76.1 fL — ABNORMAL LOW (ref 80.0–100.0)
Monocytes Absolute: 0.7 10*3/uL (ref 0.1–1.0)
Monocytes Relative: 9 %
Neutro Abs: 6.4 10*3/uL (ref 1.7–7.7)
Neutrophils Relative %: 81 %
Platelets: 376 10*3/uL (ref 150–400)
RBC: 3.85 MIL/uL — ABNORMAL LOW (ref 3.87–5.11)
RDW: 18 % — ABNORMAL HIGH (ref 11.5–15.5)
WBC: 7.8 10*3/uL (ref 4.0–10.5)
nRBC: 0 % (ref 0.0–0.2)

## 2023-12-23 LAB — BRAIN NATRIURETIC PEPTIDE: B Natriuretic Peptide: 2941.3 pg/mL — ABNORMAL HIGH (ref 0.0–100.0)

## 2023-12-23 LAB — COMPREHENSIVE METABOLIC PANEL
ALT: 11 U/L (ref 0–44)
AST: 21 U/L (ref 15–41)
Albumin: 2.7 g/dL — ABNORMAL LOW (ref 3.5–5.0)
Alkaline Phosphatase: 62 U/L (ref 38–126)
Anion gap: 14 (ref 5–15)
BUN: 10 mg/dL (ref 8–23)
CO2: 21 mmol/L — ABNORMAL LOW (ref 22–32)
Calcium: 8.6 mg/dL — ABNORMAL LOW (ref 8.9–10.3)
Chloride: 95 mmol/L — ABNORMAL LOW (ref 98–111)
Creatinine, Ser: 0.95 mg/dL (ref 0.44–1.00)
GFR, Estimated: 59 mL/min — ABNORMAL LOW (ref >60)
Glucose, Bld: 288 mg/dL — ABNORMAL HIGH (ref 70–99)
Potassium: 3.4 mmol/L — ABNORMAL LOW (ref 3.5–5.1)
Sodium: 130 mmol/L — ABNORMAL LOW (ref 135–145)
Total Bilirubin: 0.5 mg/dL (ref 0.0–1.2)
Total Protein: 6.2 g/dL — ABNORMAL LOW (ref 6.5–8.1)

## 2023-12-23 LAB — RESP PANEL BY RT-PCR (RSV, FLU A&B, COVID)  RVPGX2
Influenza A by PCR: POSITIVE — AB
Influenza B by PCR: NEGATIVE
Resp Syncytial Virus by PCR: POSITIVE — AB
SARS Coronavirus 2 by RT PCR: NEGATIVE

## 2023-12-23 LAB — PHOSPHORUS: Phosphorus: 2.3 mg/dL — ABNORMAL LOW (ref 2.5–4.6)

## 2023-12-23 LAB — LACTIC ACID, PLASMA
Lactic Acid, Venous: 2.8 mmol/L (ref 0.5–1.9)
Lactic Acid, Venous: 2.3 mmol/L (ref 0.5–1.9)

## 2023-12-23 LAB — MAGNESIUM: Magnesium: 2 mg/dL (ref 1.7–2.4)

## 2023-12-23 LAB — PROTIME-INR
INR: 1.1 (ref 0.8–1.2)
Prothrombin Time: 14 s (ref 11.4–15.2)

## 2023-12-23 MED ORDER — METHYLPREDNISOLONE SODIUM SUCC 125 MG IJ SOLR
80.0000 mg | INTRAMUSCULAR | Status: DC
  Administered 2023-12-23: 80 mg via INTRAVENOUS
  Filled 2023-12-23: qty 2

## 2023-12-23 MED ORDER — IOHEXOL 350 MG/ML SOLN
75.0000 mL | Freq: Once | INTRAVENOUS | Status: AC | PRN
  Administered 2023-12-23: 75 mL via INTRAVENOUS

## 2023-12-23 MED ORDER — SODIUM CHLORIDE 0.9 % IV BOLUS
1000.0000 mL | Freq: Once | INTRAVENOUS | Status: AC
  Administered 2023-12-23: 1000 mL via INTRAVENOUS

## 2023-12-23 MED ORDER — CITALOPRAM HYDROBROMIDE 20 MG PO TABS
10.0000 mg | ORAL_TABLET | Freq: Every evening | ORAL | Status: DC
  Administered 2023-12-24 – 2023-12-31 (×8): 10 mg via ORAL
  Filled 2023-12-23 (×8): qty 1

## 2023-12-23 MED ORDER — LISINOPRIL 10 MG PO TABS: 40.0000 mg | ORAL_TABLET | Freq: Every day | ORAL | Status: DC

## 2023-12-23 MED ORDER — OSELTAMIVIR PHOSPHATE 30 MG PO CAPS
30.0000 mg | ORAL_CAPSULE | Freq: Two times a day (BID) | ORAL | Status: DC
  Administered 2023-12-24 – 2023-12-26 (×5): 30 mg via ORAL
  Filled 2023-12-23 (×5): qty 1

## 2023-12-23 MED ORDER — ASPIRIN 81 MG PO TBEC
81.0000 mg | DELAYED_RELEASE_TABLET | Freq: Every day | ORAL | Status: DC
  Administered 2023-12-24 – 2023-12-28 (×5): 81 mg via ORAL
  Filled 2023-12-23 (×7): qty 1

## 2023-12-23 MED ORDER — LACTATED RINGERS IV BOLUS
1000.0000 mL | Freq: Once | INTRAVENOUS | Status: AC
  Administered 2023-12-23: 1000 mL via INTRAVENOUS

## 2023-12-23 MED ORDER — INSULIN ASPART 100 UNIT/ML IJ SOLN
0.0000 [IU] | Freq: Three times a day (TID) | INTRAMUSCULAR | Status: DC
  Administered 2023-12-24: 1 [IU] via SUBCUTANEOUS
  Administered 2023-12-24: 9 [IU] via SUBCUTANEOUS
  Administered 2023-12-25: 2 [IU] via SUBCUTANEOUS
  Administered 2023-12-25: 7 [IU] via SUBCUTANEOUS
  Administered 2023-12-25 – 2023-12-26 (×2): 5 [IU] via SUBCUTANEOUS
  Administered 2023-12-26 (×2): 7 [IU] via SUBCUTANEOUS
  Administered 2023-12-27: 2 [IU] via SUBCUTANEOUS
  Administered 2023-12-27: 7 [IU] via SUBCUTANEOUS
  Administered 2023-12-27: 2 [IU] via SUBCUTANEOUS
  Administered 2023-12-28: 5 [IU] via SUBCUTANEOUS
  Administered 2023-12-28: 3 [IU] via SUBCUTANEOUS
  Administered 2023-12-28 – 2023-12-29 (×2): 5 [IU] via SUBCUTANEOUS
  Administered 2023-12-29 (×2): 2 [IU] via SUBCUTANEOUS
  Administered 2023-12-30: 3 [IU] via SUBCUTANEOUS
  Administered 2023-12-30 (×2): 2 [IU] via SUBCUTANEOUS
  Administered 2023-12-31 (×2): 5 [IU] via SUBCUTANEOUS
  Administered 2023-12-31 – 2024-01-01 (×2): 2 [IU] via SUBCUTANEOUS
  Administered 2024-01-01: 3 [IU] via SUBCUTANEOUS
  Filled 2023-12-23 (×25): qty 1

## 2023-12-23 MED ORDER — OSELTAMIVIR PHOSPHATE 75 MG PO CAPS
75.0000 mg | ORAL_CAPSULE | Freq: Once | ORAL | Status: DC
  Filled 2023-12-23: qty 1

## 2023-12-23 MED ORDER — ALBUTEROL SULFATE (2.5 MG/3ML) 0.083% IN NEBU: 2.5000 mg | INHALATION_SOLUTION | RESPIRATORY_TRACT | Status: DC | PRN

## 2023-12-23 MED ORDER — METOPROLOL SUCCINATE ER 50 MG PO TB24
50.0000 mg | ORAL_TABLET | Freq: Every day | ORAL | Status: DC
  Administered 2023-12-23: 50 mg via ORAL
  Filled 2023-12-23 (×2): qty 1

## 2023-12-23 MED ORDER — DM-GUAIFENESIN ER 30-600 MG PO TB12: 1.0000 | ORAL_TABLET | Freq: Two times a day (BID) | ORAL | Status: DC | PRN

## 2023-12-23 MED ORDER — FAMOTIDINE 20 MG PO TABS
20.0000 mg | ORAL_TABLET | Freq: Every day | ORAL | Status: DC
  Administered 2023-12-23 – 2024-01-01 (×10): 20 mg via ORAL
  Filled 2023-12-23 (×11): qty 1

## 2023-12-23 MED ORDER — ONDANSETRON HCL 4 MG/2ML IJ SOLN
4.0000 mg | Freq: Three times a day (TID) | INTRAMUSCULAR | Status: DC | PRN
  Administered 2023-12-25: 4 mg via INTRAVENOUS
  Filled 2023-12-23: qty 2

## 2023-12-23 MED ORDER — INSULIN ASPART 100 UNIT/ML IJ SOLN
0.0000 [IU] | Freq: Every day | INTRAMUSCULAR | Status: DC
  Administered 2023-12-24: 4 [IU] via SUBCUTANEOUS
  Filled 2023-12-23: qty 1

## 2023-12-23 MED ORDER — HYDRALAZINE HCL 20 MG/ML IJ SOLN: 5.0000 mg | INTRAMUSCULAR | Status: DC | PRN

## 2023-12-23 MED ORDER — IPRATROPIUM-ALBUTEROL 0.5-2.5 (3) MG/3ML IN SOLN
3.0000 mL | RESPIRATORY_TRACT | Status: DC
  Administered 2023-12-23 – 2023-12-26 (×14): 3 mL via RESPIRATORY_TRACT
  Filled 2023-12-23 (×14): qty 3

## 2023-12-23 MED ORDER — OSELTAMIVIR PHOSPHATE 75 MG PO CAPS
75.0000 mg | ORAL_CAPSULE | Freq: Once | ORAL | Status: AC
  Administered 2023-12-24: 75 mg via ORAL
  Filled 2023-12-23: qty 1

## 2023-12-23 MED ORDER — ACETAMINOPHEN 325 MG PO TABS
650.0000 mg | ORAL_TABLET | Freq: Four times a day (QID) | ORAL | Status: DC | PRN
  Administered 2023-12-31: 650 mg via ORAL
  Filled 2023-12-23: qty 2

## 2023-12-23 MED ORDER — IPRATROPIUM-ALBUTEROL 0.5-2.5 (3) MG/3ML IN SOLN
6.0000 mL | Freq: Once | RESPIRATORY_TRACT | Status: AC
  Administered 2023-12-23: 6 mL via RESPIRATORY_TRACT
  Filled 2023-12-23: qty 6

## 2023-12-23 MED ORDER — POTASSIUM CHLORIDE CRYS ER 20 MEQ PO TBCR
40.0000 meq | EXTENDED_RELEASE_TABLET | Freq: Once | ORAL | Status: AC
  Administered 2023-12-23: 40 meq via ORAL
  Filled 2023-12-23: qty 2

## 2023-12-23 MED ORDER — ENOXAPARIN SODIUM 40 MG/0.4ML IJ SOSY
40.0000 mg | PREFILLED_SYRINGE | INTRAMUSCULAR | Status: DC
Start: 2023-12-23 — End: 2023-12-25
  Administered 2023-12-23 – 2023-12-24 (×2): 40 mg via SUBCUTANEOUS
  Filled 2023-12-23 (×2): qty 0.4

## 2023-12-23 MED ORDER — POTASSIUM PHOSPHATES 15 MMOLE/5ML IV SOLN
15.0000 mmol | Freq: Once | INTRAVENOUS | Status: AC
  Administered 2023-12-24: 15 mmol via INTRAVENOUS
  Filled 2023-12-23: qty 5

## 2023-12-23 MED ORDER — SIMVASTATIN 20 MG PO TABS
20.0000 mg | ORAL_TABLET | Freq: Every day | ORAL | Status: DC
  Administered 2023-12-24 – 2023-12-31 (×8): 20 mg via ORAL
  Filled 2023-12-23 (×2): qty 1
  Filled 2023-12-23: qty 2
  Filled 2023-12-23 (×5): qty 1
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
## 2023-12-23 NOTE — ED Triage Notes (Signed)
 Pt arrives via EMS from [facility]. Pt sts that she has been having SOB for the last couple of day. Per EMS facility placed a 22g IV and gave 650 ml of NS and gave pt an albuterol tx.

## 2023-12-23 NOTE — ED Provider Notes (Signed)
 [facility] Provider Note    Event Date/Time   First MD Initiated Contact with Patient 12/23/23 1835     (approximate)   History   Shortness of Breath   HPI  Laura Bell is a 88 y.o. female past medical history significant for hypertension, hyperlipidemia, diabetes, presents to the emergency department with shortness of breath.  History is provided by the patient, EMS and her daughter at bedside.  Patient is at a living facility but normally is very active and does all of her ADLs at the facility.  Approximately 3 weeks ago had an episode of norovirus and has not done well since that time.  Approximately 2 weeks of shortness of breath, cough and not feeling well.  Tested negative for COVID at the facility.  Has been given DuoNeb treatments but has no prior diagnosis of asthma or COPD.  No tobacco history.  No history of DVT or PE.  Decreased p.o. intake.  Denies fever.  Daughter states that she has been asking the facility for testing for influenza and they told her it was not influenza secondary to her not having a fever.  No prior oxygen use.  Found to be hypoxic on arrival to the emergency department and placed on 3 L nasal cannula.     Physical Exam   Triage Vital Signs: ED Triage Vitals  Encounter Vitals Group     BP 12/23/23 1832 119/74     Systolic BP Percentile --      Diastolic BP Percentile --      Pulse Rate 12/23/23 1832 (!) 128     Resp 12/23/23 1832 18     Temp 12/23/23 1832 98.3 F (36.8 C)     Temp Source 12/23/23 1832 Oral     SpO2 12/23/23 1832 92 %     Weight 12/23/23 1828 159 lb 8 oz (72.3 kg)     Height --      Head Circumference --      Peak Flow --      Pain Score 12/23/23 1828 0     Pain Loc --      Pain Education --      Exclude from Growth Chart --     Most recent vital signs: Vitals:   12/23/23 1832 12/23/23 1930  BP: 119/74 121/72  Pulse: (!) 128 (!) 120  Resp: 18 18  Temp: 98.3 F (36.8 C)   SpO2: 92%  100%    Physical Exam Constitutional:      Appearance: She is well-developed.  HENT:     Head: Atraumatic.  Eyes:     Conjunctiva/sclera: Conjunctivae normal.  Cardiovascular:     Rate and Rhythm: Regular rhythm. Tachycardia present.  Pulmonary:     Effort: Tachypnea and respiratory distress present.     Breath sounds: Wheezing and rhonchi present.     Comments: Hypoxic to the high 80s, placed on 2 L nasal cannula Chest:     Chest wall: No tenderness.  Abdominal:     General: There is no distension.     Palpations: Abdomen is soft.     Tenderness: There is no abdominal tenderness.  Musculoskeletal:        General: Normal range of motion.     Cervical back: Normal range of motion.     Right lower leg: No edema.     Left lower leg: No edema.     Comments: No unilateral leg swelling  Skin:  General: Skin is warm.     Capillary Refill: Capillary refill takes less than 2 seconds.  Neurological:     Mental Status: She is alert. Mental status is at baseline.     IMPRESSION / MDM / ASSESSMENT AND PLAN / ED COURSE  I reviewed the triage vital signs and the nursing notes.  On arrival patient was tachycardic, tachypneic, requiring 2 L of nasal cannula.  Normotensive and afebrile.  Differential diagnosis including acute bronchitis, pneumonia, viral illness including COVID/influenza, dehydration, pulmonary embolism  Blood cultures obtained.  Will give 1 L of IV fluids.  EKG  I, Corena Herter, the attending physician, personally viewed and interpreted this ECG. Sinus tachycardia with a rate of 128.  Normal intervals.  No chamber enlargement.  No significant ST elevation or depression.  Sinus tachycardia while on cardiac telemetry.  RADIOLOGY I independently reviewed imaging, my interpretation of imaging: Chest x-ray with no focal findings consistent with pneumonia.  Read as no acute findings.  LABS (all labs ordered are listed, but only abnormal results are  displayed) Labs interpreted as -    Labs Reviewed  RESP PANEL BY RT-PCR (RSV, FLU A&B, COVID)  [id number] - Abnormal; Notable for the following components:      Result Value   Influenza A by PCR POSITIVE (*)    Resp Syncytial Virus by PCR POSITIVE (*)    All other components within normal limits  COMPREHENSIVE METABOLIC PANEL - Abnormal; Notable for the following components:   Sodium 130 (*)    Potassium 3.4 (*)    Chloride 95 (*)    CO2 21 (*)    Glucose, Bld 288 (*)    Calcium 8.6 (*)    Total Protein 6.2 (*)    Albumin 2.7 (*)    GFR, Estimated 59 (*)    All other components within normal limits  LACTIC ACID, PLASMA - Abnormal; Notable for the following components:   Lactic Acid, Venous 2.8 (*)    All other components within normal limits  CBC WITH DIFFERENTIAL/PLATELET - Abnormal; Notable for the following components:   RBC 3.85 (*)    Hemoglobin 9.3 (*)    HCT 29.3 (*)    MCV 76.1 (*)    MCH 24.2 (*)    RDW 18.0 (*)    Lymphs Abs 0.6 (*)    All other components within normal limits  CULTURE, BLOOD (ROUTINE X 2)  CULTURE, BLOOD (ROUTINE X 2)  LACTIC ACID, PLASMA  URINALYSIS, W/ REFLEX TO CULTURE (INFECTION SUSPECTED)  PROTIME-INR  BRAIN NATRIURETIC PEPTIDE  TROPONIN I (HIGH SENSITIVITY)     MDM  Patient received DuoNeb treatment just prior to arrival.  Arrives tachycardia.  Does appear dehydrated.  Started on 1 L of IV fluids.  Elevated lactic acid to 2.8.  Does not have a leukocytosis.  Influenza A and RSV were both positive.  Mild hyponatremia.  Hyperglycemia but does not meet criteria for DKA.  Given Tamiflu.  Given another 1 L of IV fluids and DuoNeb treatment given her ongoing wheezing.  Consulted hospitalist for acute hypoxic respiratory failure in the setting of influenza A and RSV.  Patient also has acute kidney injury with creatinine elevated up to 0.95 from a baseline of 0.5.     PROCEDURES:  Critical Care performed: yes  .Critical Care  Performed  by: Corena Herter, MD Authorized by: Corena Herter, MD   Critical care provider statement:    Critical care time (minutes):  30   Critical  care time was exclusive of:  Separately billable procedures and treating other patients   Critical care was necessary to treat or prevent imminent or life-threatening deterioration of the following conditions:  Sepsis   Critical care was time spent personally by me on the following activities:  Development of treatment plan with patient or surrogate, discussions with consultants, evaluation of patient's response to treatment, examination of patient, ordering and review of laboratory studies, ordering and review of radiographic studies, ordering and performing treatments and interventions, pulse oximetry, re-evaluation of patient's condition and review of old charts   Care discussed with: admitting provider     Patient's presentation is most consistent with acute presentation with potential threat to life or bodily function.   MEDICATIONS ORDERED IN ED: Medications  oseltamivir (TAMIFLU) capsule 75 mg (has no administration in time range)  sodium chloride 0.9 % bolus 1,000 mL (0 mLs Intravenous Stopped 12/23/23 2004)  ipratropium-albuterol (DUONEB) 0.5-2.5 (3) MG/3ML nebulizer solution 6 mL (6 mLs Nebulization Given 12/23/23 2013)  lactated ringers bolus 1,000 mL (1,000 mLs Intravenous New Bag/Given 12/23/23 2004)    FINAL CLINICAL IMPRESSION(S) / ED DIAGNOSES   Final diagnoses:  Hypoxia  SOB (shortness of breath)  RSV (acute bronchiolitis due to respiratory syncytial virus)  Influenza A     Rx / DC Orders   ED Discharge Orders     None        Note:  This document was prepared using Dragon voice recognition software and may include unintentional dictation errors.   Corena Herter, MD 12/23/23 2023

## 2023-12-24 DIAGNOSIS — D689 Coagulation defect, unspecified: Secondary | ICD-10-CM | POA: Diagnosis present

## 2023-12-24 DIAGNOSIS — Y828 Other medical devices associated with adverse incidents: Secondary | ICD-10-CM | POA: Diagnosis not present

## 2023-12-24 DIAGNOSIS — W182XXA Fall in (into) shower or empty bathtub, initial encounter: Secondary | ICD-10-CM | POA: Diagnosis present

## 2023-12-24 DIAGNOSIS — A4189 Other specified sepsis: Secondary | ICD-10-CM | POA: Diagnosis present

## 2023-12-24 DIAGNOSIS — E871 Hypo-osmolality and hyponatremia: Secondary | ICD-10-CM | POA: Diagnosis present

## 2023-12-24 DIAGNOSIS — J210 Acute bronchiolitis due to respiratory syncytial virus: Secondary | ICD-10-CM | POA: Diagnosis present

## 2023-12-24 DIAGNOSIS — I110 Hypertensive heart disease with heart failure: Secondary | ICD-10-CM | POA: Diagnosis present

## 2023-12-24 DIAGNOSIS — E1169 Type 2 diabetes mellitus with other specified complication: Secondary | ICD-10-CM | POA: Diagnosis present

## 2023-12-24 DIAGNOSIS — I429 Cardiomyopathy, unspecified: Secondary | ICD-10-CM | POA: Diagnosis present

## 2023-12-24 DIAGNOSIS — B338 Other specified viral diseases: Secondary | ICD-10-CM | POA: Diagnosis not present

## 2023-12-24 DIAGNOSIS — I480 Paroxysmal atrial fibrillation: Secondary | ICD-10-CM | POA: Diagnosis present

## 2023-12-24 DIAGNOSIS — I2489 Other forms of acute ischemic heart disease: Secondary | ICD-10-CM | POA: Diagnosis present

## 2023-12-24 DIAGNOSIS — Y848 Other medical procedures as the cause of abnormal reaction of the patient, or of later complication, without mention of misadventure at the time of the procedure: Secondary | ICD-10-CM | POA: Diagnosis not present

## 2023-12-24 DIAGNOSIS — J121 Respiratory syncytial virus pneumonia: Secondary | ICD-10-CM | POA: Diagnosis present

## 2023-12-24 DIAGNOSIS — J101 Influenza due to other identified influenza virus with other respiratory manifestations: Secondary | ICD-10-CM | POA: Diagnosis not present

## 2023-12-24 DIAGNOSIS — R570 Cardiogenic shock: Secondary | ICD-10-CM | POA: Diagnosis not present

## 2023-12-24 DIAGNOSIS — J9601 Acute respiratory failure with hypoxia: Secondary | ICD-10-CM | POA: Diagnosis present

## 2023-12-24 DIAGNOSIS — F32A Depression, unspecified: Secondary | ICD-10-CM | POA: Diagnosis present

## 2023-12-24 DIAGNOSIS — Y93E1 Activity, personal bathing and showering: Secondary | ICD-10-CM | POA: Diagnosis not present

## 2023-12-24 DIAGNOSIS — L02413 Cutaneous abscess of right upper limb: Secondary | ICD-10-CM | POA: Diagnosis not present

## 2023-12-24 DIAGNOSIS — E1165 Type 2 diabetes mellitus with hyperglycemia: Secondary | ICD-10-CM | POA: Diagnosis present

## 2023-12-24 DIAGNOSIS — D509 Iron deficiency anemia, unspecified: Secondary | ICD-10-CM | POA: Diagnosis present

## 2023-12-24 DIAGNOSIS — Z1152 Encounter for screening for COVID-19: Secondary | ICD-10-CM | POA: Diagnosis not present

## 2023-12-24 DIAGNOSIS — Y92239 Unspecified place in hospital as the place of occurrence of the external cause: Secondary | ICD-10-CM | POA: Diagnosis not present

## 2023-12-24 DIAGNOSIS — I4892 Unspecified atrial flutter: Secondary | ICD-10-CM | POA: Diagnosis present

## 2023-12-24 DIAGNOSIS — I5043 Acute on chronic combined systolic (congestive) and diastolic (congestive) heart failure: Secondary | ICD-10-CM | POA: Diagnosis not present

## 2023-12-24 DIAGNOSIS — R0902 Hypoxemia: Secondary | ICD-10-CM | POA: Diagnosis not present

## 2023-12-24 DIAGNOSIS — I4891 Unspecified atrial fibrillation: Secondary | ICD-10-CM | POA: Diagnosis not present

## 2023-12-24 DIAGNOSIS — Z794 Long term (current) use of insulin: Secondary | ICD-10-CM | POA: Diagnosis not present

## 2023-12-24 DIAGNOSIS — I5023 Acute on chronic systolic (congestive) heart failure: Secondary | ICD-10-CM | POA: Diagnosis not present

## 2023-12-24 DIAGNOSIS — N179 Acute kidney failure, unspecified: Secondary | ICD-10-CM | POA: Diagnosis present

## 2023-12-24 DIAGNOSIS — E872 Acidosis, unspecified: Secondary | ICD-10-CM | POA: Diagnosis present

## 2023-12-24 DIAGNOSIS — E860 Dehydration: Secondary | ICD-10-CM | POA: Diagnosis present

## 2023-12-24 DIAGNOSIS — J1008 Influenza due to other identified influenza virus with other specified pneumonia: Secondary | ICD-10-CM | POA: Diagnosis present

## 2023-12-24 LAB — BASIC METABOLIC PANEL
Anion gap: 9 (ref 5–15)
BUN: 11 mg/dL (ref 8–23)
CO2: 23 mmol/L (ref 22–32)
Calcium: 8.5 mg/dL — ABNORMAL LOW (ref 8.9–10.3)
Chloride: 100 mmol/L (ref 98–111)
Creatinine, Ser: 0.96 mg/dL (ref 0.44–1.00)
GFR, Estimated: 58 mL/min — ABNORMAL LOW (ref >60)
Glucose, Bld: 328 mg/dL — ABNORMAL HIGH (ref 70–99)
Potassium: 4.1 mmol/L (ref 3.5–5.1)
Sodium: 132 mmol/L — ABNORMAL LOW (ref 135–145)

## 2023-12-24 LAB — URINALYSIS, W/ REFLEX TO CULTURE (INFECTION SUSPECTED)
Bilirubin Urine: NEGATIVE
Glucose, UA: >=500 mg/dL — AB
Hgb urine dipstick: NEGATIVE
Ketones, ur: 5 mg/dL — AB
Nitrite: NEGATIVE
Protein, ur: NEGATIVE mg/dL
Specific Gravity, Urine: 1.018 (ref 1.005–1.030)
pH: 6 (ref 5.0–8.0)

## 2023-12-24 LAB — CBC
HCT: 32.2 % — ABNORMAL LOW (ref 36.0–46.0)
Hemoglobin: 9.7 g/dL — ABNORMAL LOW (ref 12.0–15.0)
MCH: 23.9 pg — ABNORMAL LOW (ref 26.0–34.0)
MCHC: 30.1 g/dL (ref 30.0–36.0)
MCV: 79.3 fL — ABNORMAL LOW (ref 80.0–100.0)
Platelets: 411 10*3/uL — ABNORMAL HIGH (ref 150–400)
RBC: 4.06 MIL/uL (ref 3.87–5.11)
RDW: 18.3 % — ABNORMAL HIGH (ref 11.5–15.5)
WBC: 6.1 10*3/uL (ref 4.0–10.5)
nRBC: 0 % (ref 0.0–0.2)

## 2023-12-24 LAB — PHOSPHORUS: Phosphorus: 2.2 mg/dL — ABNORMAL LOW (ref 2.5–4.6)

## 2023-12-24 LAB — CBG MONITORING, ED
Glucose-Capillary: 138 mg/dL — ABNORMAL HIGH (ref 70–99)
Glucose-Capillary: 326 mg/dL — ABNORMAL HIGH (ref 70–99)
Glucose-Capillary: 368 mg/dL — ABNORMAL HIGH (ref 70–99)
Glucose-Capillary: 464 mg/dL — ABNORMAL HIGH (ref 70–99)

## 2023-12-24 MED ORDER — INSULIN GLARGINE-YFGN 100 UNIT/ML ~~LOC~~ SOLN
10.0000 [IU] | Freq: Every day | SUBCUTANEOUS | Status: DC
  Administered 2023-12-24: 10 [IU] via SUBCUTANEOUS
  Filled 2023-12-24 (×2): qty 0.1

## 2023-12-24 MED ORDER — FUROSEMIDE 10 MG/ML IJ SOLN
40.0000 mg | Freq: Two times a day (BID) | INTRAMUSCULAR | Status: DC
  Administered 2023-12-24 – 2023-12-25 (×4): 40 mg via INTRAVENOUS
  Filled 2023-12-24 (×4): qty 4

## 2023-12-24 MED ORDER — LORAZEPAM 2 MG/ML IJ SOLN
0.5000 mg | Freq: Four times a day (QID) | INTRAMUSCULAR | Status: DC | PRN
  Administered 2023-12-24 – 2023-12-25 (×3): 0.5 mg via INTRAVENOUS
  Filled 2023-12-24 (×3): qty 1

## 2023-12-24 MED ORDER — LORAZEPAM 2 MG/ML IJ SOLN
1.0000 mg | Freq: Once | INTRAMUSCULAR | Status: AC
  Administered 2023-12-24: 1 mg via INTRAVENOUS
  Filled 2023-12-24: qty 1

## 2023-12-24 MED ORDER — METOPROLOL TARTRATE 5 MG/5ML IV SOLN
2.5000 mg | Freq: Four times a day (QID) | INTRAVENOUS | Status: DC
  Administered 2023-12-24 – 2023-12-25 (×5): 2.5 mg via INTRAVENOUS
  Filled 2023-12-24 (×5): qty 5

## 2023-12-24 MED ORDER — METHYLPREDNISOLONE SODIUM SUCC 40 MG IJ SOLR
40.0000 mg | INTRAMUSCULAR | Status: DC
  Administered 2023-12-24 – 2023-12-25 (×2): 40 mg via INTRAVENOUS
  Filled 2023-12-24 (×2): qty 1

## 2023-12-24 MED ORDER — INSULIN ASPART 100 UNIT/ML IJ SOLN
10.0000 [IU] | Freq: Once | INTRAMUSCULAR | Status: AC
  Administered 2023-12-24: 10 [IU] via SUBCUTANEOUS
  Filled 2023-12-24: qty 1

## 2023-12-24 MED ORDER — METOPROLOL TARTRATE 5 MG/5ML IV SOLN
5.0000 mg | INTRAVENOUS | Status: DC | PRN
  Administered 2023-12-24 – 2023-12-25 (×6): 5 mg via INTRAVENOUS
  Filled 2023-12-24 (×5): qty 5

## 2023-12-24 MED ORDER — GLYCOPYRROLATE 0.2 MG/ML IJ SOLN
0.1000 mg | Freq: Once | INTRAMUSCULAR | Status: AC
  Administered 2023-12-24: 0.1 mg via INTRAVENOUS
  Filled 2023-12-24: qty 0.5

## 2023-12-24 NOTE — ED Notes (Signed)
 Checked on pt and offered her a sip of water.  Pt continues to be very sleepy, updated her son on the phone

## 2023-12-24 NOTE — ED Notes (Signed)
 Provider on way to see pt. Also informed provider of CBG 464. Pt removing duoneb mask. Placed back on pt.

## 2023-12-24 NOTE — ED Notes (Signed)
 Repositioned pt in bed to high fowlers. In communication with hospitalist regarding respiratory status/RR in 30s, HR 130s, and anxiety.

## 2023-12-24 NOTE — Progress Notes (Signed)
 PROGRESS NOTE  Laura Bell    DOB: Apr 02, 1936, 88 y.o.  YQM:[id number]    Code Status: Full Code   DOA: 12/23/2023   LOS: 0   Brief hospital course  Laura Bell is a 88 y.o. female with medical history significant of HTN, HLD, sCHF with EF 40-45%, depression, anemia, breast cancer, uterus cancer, who presents with SOB.   ED Course: positive PCR for flu A and RSV, WBC 7.8, potassium 3.4, mild AKI with creatinine 0.95, BUN 10 and GFR 59 (baseline creatinine 0.58 on 08/02/2020), lactic acid 2.8, temperature normal, blood pressure 121/72, heart rate 128, RR 18, oxygen saturation 92% on room air, which improved to 99% on 2 L oxygen.  Chest x-ray negative.    BMW:UXLKG rhythm, QTc 469, LAE, low voltage, nonspecific T wave change.  12/24/23 -patient has significant anxiety and increased WOB today possibly related to tachycardia- sinus on monitor. Improved with bipap, ativan and metoprolol. Try to limit albuterol  Assessment & Plan  Principal Problem:   Influenza A Active Problems:   RSV (respiratory syncytial virus pneumonia)   Elevated lactic acid level   Chronic systolic CHF (congestive heart failure) (HCC)   Hyperlipidemia associated with type 2 diabetes mellitus (HCC)   Diabetes mellitus without complication (HCC)   Essential hypertension   AKI (acute kidney injury) (HCC)   Depression   Hypokalemia   Hypophosphatemia   Overweight (BMI 25.0-29.9)  Acute hypoxic respiratory failure Influenza A and RSV infection:  O2 sats are stable on 2L but has significant tachypnea and increased WOB. Placed on bipap with improvement.  -Bronchodilators and prn Mucinex -Solu-Medrol 40 mg bid -Incentive spirometry -Tamiflu 30 mg twice daily - CTA -> negative for PE - admitting to progressive - adding robinol x1 with significant secretions. Change to sudafed when taking PO  Sinus tachycardia- on chronic metoprolol. May be exacerbated with LABAs. Sinus rhythm. Unable to reliably take PO  while on bipap.  - added metoprolol q6hr IV until reliable PO intake. As well as PRN metoprolol. Careful monitoring of BP which was briefly low this am.    Chronic systolic CHF (congestive heart failure) (HCC): 2D echo on 05/30/2019 showed EF of 40-45%.  Patient does not have leg edema or JVD.  CHF seem to be compensated. No significant pulmonary edema on imaging -BNP is elevated at 2941, likely due to 2 L of IV fluid given in ED - s/p lasix without huge response yet   Hyperlipidemia associated with type 2 diabetes mellitus (HCC) -Zocor   Diabetes mellitus without complication (HCC): Most recent A1c 11.7, poorly controlled.  Patient taking metformin and glipizide at home -Sliding scale insulin   Essential hypertension- borderline low -metoprolol -Hold lisinopril due to mild AKI   AKI: Possibly due to dehydration- stable -Hold lisinopril   Depression -Celexa   Hypokalemia and hypophosphatemia: Potassium 3.4, phosphorus 2.3, magnesium 2.0 -Repleted potassium and phosphorus   Overweight: Body weight 72.3 kg, BMI 29.17 -Encourage losing weight -Exercise and healthy diet  Body mass index is 29.17 kg/m.  VTE ppx: enoxaparin (LOVENOX) injection 40 mg Start: 12/23/23 2100  Diet:     Diet   Diet NPO time specified   Consultants: None   Subjective 12/24/23    Pt reports nothing at this time. She is sedated from recent ativan dose. Was interactive when moving her in bed.    Objective   Vitals:   12/24/23 1129 12/24/23 1130 12/24/23 1200 12/24/23 1230  BP:  (!) 121/91 103/67 97/67  Pulse:  92 (!) 109 100  Resp:  (!) 29 (!) 25 (!) 25  Temp: 97.8 F (36.6 C)     TempSrc: Axillary     SpO2:  100% 100% 100%  Weight:      Height:        Intake/Output Summary (Last 24 hours) at 12/24/2023 1404 Last data filed at 12/23/2023 2155 Gross per 24 hour  Intake 2000 ml  Output --  Net 2000 ml   Filed Weights   12/23/23 1828  Weight: 72.3 kg     Physical Exam:  General:  asleep. Tachypneic. Arrousable briefly with physical stimulation Respiratory: increased respiratory effort. Mild rales, mostly upper airway stertor. Accessory muscle use. Mouth breathing Cardiovascular: quick capillary refill, normal S1/S2, rapid heart rate, normal rhythm, no JVD, murmurs Nervous: A&O x3. no gross focal neurologic deficits, normal speech Extremities: moves all equally, no edema, normal tone Skin: dry, intact, normal temperature, normal color. No rashes, lesions or ulcers on exposed skin  Labs   I have personally reviewed the following labs and imaging studies CBC    Component Value Date/Time   WBC 6.1 12/24/2023 0421   RBC 4.06 12/24/2023 0421   HGB 9.7 (L) 12/24/2023 0421   HGB 12.9 11/26/2013 1445   HCT 32.2 (L) 12/24/2023 0421   HCT 39.5 11/26/2013 1445   PLT 411 (H) 12/24/2023 0421   PLT 273 11/26/2013 1445   MCV 79.3 (L) 12/24/2023 0421   MCV 87.6 11/26/2013 1445   MCH 23.9 (L) 12/24/2023 0421   MCHC 30.1 12/24/2023 0421   RDW 18.3 (H) 12/24/2023 0421   RDW 13.3 11/26/2013 1445   LYMPHSABS 0.6 (L) 12/23/2023 1836   LYMPHSABS 2.0 11/26/2013 1445   MONOABS 0.7 12/23/2023 1836   MONOABS 0.7 11/26/2013 1445   EOSABS 0.1 12/23/2023 1836   EOSABS 0.3 11/26/2013 1445   BASOSABS 0.0 12/23/2023 1836   BASOSABS 0.1 11/26/2013 1445      Latest Ref Rng & Units 12/24/2023    4:21 AM 12/23/2023    6:36 PM 08/02/2020   10:09 AM  BMP  Glucose 70 - 99 mg/dL 086  578  469   BUN 8 - 23 mg/dL 11  10  7    Creatinine 0.44 - 1.00 mg/dL 6.29  5.28  4.13   Sodium 135 - 145 mmol/L 132  130  134   Potassium 3.5 - 5.1 mmol/L 4.1  3.4  4.3   Chloride 98 - 111 mmol/L 100  95  99   CO2 22 - 32 mmol/L 23  21  28    Calcium 8.9 - 10.3 mg/dL 8.5  8.6  9.3     CT Angio Chest Pulmonary Embolism (PE) W or WO Contrast Result Date: 12/23/2023 CLINICAL DATA:  Shortness of breath for a few days, initial encounter EXAM: CT ANGIOGRAPHY CHEST WITH CONTRAST TECHNIQUE: Multidetector CT  imaging of the chest was performed using the standard protocol during bolus administration of intravenous contrast. Multiplanar CT image reconstructions and MIPs were obtained to evaluate the vascular anatomy. RADIATION DOSE REDUCTION: This exam was performed according to the departmental dose-optimization program which includes automated exposure control, adjustment of the mA and/or kV according to patient size and/or use of iterative reconstruction technique. CONTRAST:  75mL OMNIPAQUE IOHEXOL 350 MG/ML SOLN COMPARISON:  10/04/2007 FINDINGS: Cardiovascular: Atherosclerotic calcifications of the thoracic aorta are noted without aneurysmal dilatation. The degree of opacification is limited precluding evaluation for dissection. The pulmonary artery shows a normal branching pattern bilaterally. No  filling defect to suggest pulmonary embolism is noted. Coronary calcifications are seen. The heart is not significantly enlarged in size. Mediastinum/Nodes: Thoracic inlet is within normal limits. No hilar or mediastinal adenopathy is noted. The esophagus as visualized is within normal limits. Lungs/Pleura: Lungs are well aerated bilaterally. No focal confluent infiltrate is seen. Mild emphysematous changes are noted. Some bronchial thickening is noted within the right lower lobe. No sizable parenchymal nodule is noted. Upper Abdomen: Visualized upper abdomen shows no acute abnormality. Musculoskeletal: No acute rib abnormality is noted. Review of the MIP images confirms the above findings. IMPRESSION: No evidence of pulmonary emboli. Mild bronchial thickening is noted particularly in the right lower lobe without focal infiltrate. Aortic Atherosclerosis (ICD10-I70.0) and Emphysema (ICD10-J43.9). Electronically Signed   By: Alcide Clever M.D.   On: 12/23/2023 21:48   DG Chest Port 1 View Result Date: 12/23/2023 CLINICAL DATA:  Shortness of breath. EXAM: PORTABLE CHEST 1 VIEW COMPARISON:  June 29, 2019. FINDINGS: Stable  cardiomediastinal silhouette. Both lungs are clear. The visualized skeletal structures are unremarkable. IMPRESSION: No active disease. Electronically Signed   By: Lupita Raider M.D.   On: 12/23/2023 19:07    Disposition Plan & Communication  Patient status: Inpatient  Admitted From: Home Planned disposition location: Skilled nursing facility Anticipated discharge date: TBD pending clinical improvement  Family Communication: none at bedside    Author: Leeroy Bock, DO [facility] 12/24/2023, 2:04 PM   Available by Epic secure chat 7AM-7PM. If 7PM-7AM, please contact night-coverage.  [facility] contact information found on [email or web address].

## 2023-12-24 NOTE — ED Notes (Signed)
 Messaged Dr Dareen Piano re: pt respiratory status and anxiety.

## 2023-12-24 NOTE — ED Notes (Signed)
 RT at bedside to eval for bipap.

## 2023-12-24 NOTE — ED Notes (Signed)
 Pt calling on call bell asking for water. Pt appears confused. Asking repetitive questions. Pt is very anxious. Provided sip of water. Breathing is labored and sounds wet. Replaced oxygen which was off. Will message hospitalist once assigned.

## 2023-12-24 NOTE — ED Notes (Signed)
 Pt was moved to hospital bed, mepilex sacrum applied to bedsore (stage 2 with sheer) and pt was positioned to right side

## 2023-12-24 NOTE — ED Notes (Signed)
 Dr. Dareen Piano at bedside.

## 2023-12-24 NOTE — Progress Notes (Signed)
 PT Cancellation Note  Patient Details Name: Laura Bell MRN: [id number] DOB: [birth year]   Cancelled Treatment:    Reason Eval/Treat Not Completed: Patient not medically ready.  Chart reviewed and noted that pt is currently not appropriate for PT evaluation at this time. Pt currently has red MEWS score with increased RR, HR and BP; RT evaluating for potential BiPAP.  Therapy will re-attempt at later date/time as medically appropriate.      Nolon Bussing, PT, DPT Physical Therapist - [facility]  12/24/23, 10:12 AM

## 2023-12-24 NOTE — Progress Notes (Signed)
 OT Cancellation Note  Patient Details Name: Laura Bell MRN: [id number] DOB: 1936-05-03   Cancelled Treatment:    Reason Eval/Treat Not Completed: Patient not medically ready. Orders received, chart reviewed. Pt currently not appropriate for OT eval at this time, noted to have red MEWS score with increased RR, HR and BP; RT evaluating for potential BiPAP. OT will hold at this time and eval pt when appropriate.   Shere Eisenhart L. Velvie Thomaston, OTR/L  12/24/23, 8:51 AM

## 2023-12-24 NOTE — ED Notes (Signed)
RT on way with bipap

## 2023-12-24 NOTE — ED Notes (Signed)
 Dr Dareen Piano on way to see pt. Verbal order for bipap. Called RT.

## 2023-12-24 NOTE — ED Notes (Signed)
 Messaged Dr Dareen Piano about resp status. Pt is still very labored, using abdominal muscles to exhale. Respirations are wet. Have repositioned pt and given duoneb.

## 2023-12-24 NOTE — ED Notes (Signed)
 Pt has been consistently 100% on bipap and sleeping.  Son is here to visit.  Pt has some red marks where bipap mask was, giving pt a pause from the bipap while her son is here to visit her.  Placed her on 2l NS and pt appears to be tolerating this well at this time.  Sips of ice water given.

## 2023-12-24 NOTE — ED Notes (Signed)
 Respiratory status seems drastically improved from bipap. RR in 20s now, less labored. Pt is sleeping.

## 2023-12-25 ENCOUNTER — Other Ambulatory Visit: Payer: Self-pay | Admitting: Cardiovascular Disease

## 2023-12-25 ENCOUNTER — Encounter: Payer: Self-pay | Admitting: Student in an Organized Health Care Education/Training Program

## 2023-12-25 DIAGNOSIS — A419 Sepsis, unspecified organism: Secondary | ICD-10-CM

## 2023-12-25 DIAGNOSIS — I4891 Unspecified atrial fibrillation: Secondary | ICD-10-CM | POA: Diagnosis not present

## 2023-12-25 DIAGNOSIS — J101 Influenza due to other identified influenza virus with other respiratory manifestations: Secondary | ICD-10-CM | POA: Diagnosis not present

## 2023-12-25 DIAGNOSIS — J210 Acute bronchiolitis due to respiratory syncytial virus: Secondary | ICD-10-CM

## 2023-12-25 DIAGNOSIS — R0902 Hypoxemia: Principal | ICD-10-CM

## 2023-12-25 DIAGNOSIS — J9601 Acute respiratory failure with hypoxia: Secondary | ICD-10-CM | POA: Diagnosis not present

## 2023-12-25 DIAGNOSIS — J121 Respiratory syncytial virus pneumonia: Secondary | ICD-10-CM | POA: Diagnosis not present

## 2023-12-25 DIAGNOSIS — R0602 Shortness of breath: Secondary | ICD-10-CM

## 2023-12-25 DIAGNOSIS — I959 Hypotension, unspecified: Secondary | ICD-10-CM

## 2023-12-25 DIAGNOSIS — R570 Cardiogenic shock: Secondary | ICD-10-CM

## 2023-12-25 LAB — BASIC METABOLIC PANEL WITH GFR
Anion gap: 17 — ABNORMAL HIGH (ref 5–15)
BUN: 18 mg/dL (ref 8–23)
CO2: 19 mmol/L — ABNORMAL LOW (ref 22–32)
Calcium: 8.6 mg/dL — ABNORMAL LOW (ref 8.9–10.3)
Chloride: 98 mmol/L (ref 98–111)
Creatinine, Ser: 1.14 mg/dL — ABNORMAL HIGH (ref 0.44–1.00)
GFR, Estimated: 47 mL/min — ABNORMAL LOW (ref >60)
Glucose, Bld: 320 mg/dL — ABNORMAL HIGH (ref 70–99)
Potassium: 3.4 mmol/L — ABNORMAL LOW (ref 3.5–5.1)
Sodium: 134 mmol/L — ABNORMAL LOW (ref 135–145)

## 2023-12-25 LAB — PROTIME-INR
INR: 1.2 (ref 0.8–1.2)
Prothrombin Time: 15.2 s (ref 11.4–15.2)

## 2023-12-25 LAB — CBC
HCT: 29.8 % — ABNORMAL LOW (ref 36.0–46.0)
Hemoglobin: 9.7 g/dL — ABNORMAL LOW (ref 12.0–15.0)
MCH: 24.6 pg — ABNORMAL LOW (ref 26.0–34.0)
MCHC: 32.6 g/dL (ref 30.0–36.0)
MCV: 75.6 fL — ABNORMAL LOW (ref 80.0–100.0)
Platelets: 442 10*3/uL — ABNORMAL HIGH (ref 150–400)
RBC: 3.94 MIL/uL (ref 3.87–5.11)
RDW: 18.1 % — ABNORMAL HIGH (ref 11.5–15.5)
WBC: 15.4 10*3/uL — ABNORMAL HIGH (ref 4.0–10.5)
nRBC: 0 % (ref 0.0–0.2)

## 2023-12-25 LAB — GLUCOSE, CAPILLARY: Glucose-Capillary: 262 mg/dL — ABNORMAL HIGH (ref 70–99)

## 2023-12-25 LAB — TSH: TSH: 0.63 u[IU]/mL (ref 0.350–4.500)

## 2023-12-25 LAB — APTT: aPTT: 25 s (ref 24–36)

## 2023-12-25 LAB — CBG MONITORING, ED: Glucose-Capillary: 313 mg/dL — ABNORMAL HIGH (ref 70–99)

## 2023-12-25 MED ORDER — LEVALBUTEROL HCL 1.25 MG/0.5ML IN NEBU: 1.2500 mg | INHALATION_SOLUTION | RESPIRATORY_TRACT | Status: DC | PRN

## 2023-12-25 MED ORDER — DILTIAZEM HCL-DEXTROSE 125-5 MG/125ML-% IV SOLN (PREMIX)
5.0000 mg/h | INTRAVENOUS | Status: DC
Start: 2023-12-25 — End: 2023-12-25
  Administered 2023-12-25: 5 mg/h via INTRAVENOUS
  Filled 2023-12-25 (×3): qty 125

## 2023-12-25 MED ORDER — PHENTOLAMINE MESYLATE 5 MG IJ SOLR
5.0000 mg | Freq: Once | INTRAMUSCULAR | Status: AC
  Administered 2023-12-25: 5 mg via SUBCUTANEOUS
  Filled 2023-12-25: qty 5

## 2023-12-25 MED ORDER — STERILE WATER FOR INJECTION IJ SOLN
INTRAMUSCULAR | Status: AC
Start: 2023-12-25 — End: 2023-12-25
  Administered 2023-12-25: 1 mL
  Filled 2023-12-25: qty 20

## 2023-12-25 MED ORDER — METOPROLOL TARTRATE 5 MG/5ML IV SOLN
5.0000 mg | Freq: Four times a day (QID) | INTRAVENOUS | Status: DC
  Administered 2023-12-25 (×2): 5 mg via INTRAVENOUS
  Filled 2023-12-25 (×3): qty 5

## 2023-12-25 MED ORDER — HEPARIN (PORCINE) 25000 UT/250ML-% IV SOLN
1000.0000 [IU]/h | INTRAVENOUS | Status: AC
  Administered 2023-12-25 – 2023-12-28 (×4): 1000 [IU]/h via INTRAVENOUS
  Filled 2023-12-25 (×3): qty 250

## 2023-12-25 MED ORDER — AMIODARONE HCL IN DEXTROSE 360-4.14 MG/200ML-% IV SOLN
30.0000 mg/h | INTRAVENOUS | Status: DC
  Administered 2023-12-26: 30 mg/h via INTRAVENOUS
  Filled 2023-12-25: qty 200

## 2023-12-25 MED ORDER — AMIODARONE HCL IN DEXTROSE 360-4.14 MG/200ML-% IV SOLN
60.0000 mg/h | INTRAVENOUS | Status: DC
  Administered 2023-12-25 (×2): 60 mg/h via INTRAVENOUS
  Filled 2023-12-25: qty 200

## 2023-12-25 MED ORDER — POTASSIUM CHLORIDE CRYS ER 20 MEQ PO TBCR
40.0000 meq | EXTENDED_RELEASE_TABLET | Freq: Once | ORAL | Status: AC
  Administered 2023-12-25: 40 meq via ORAL
  Filled 2023-12-25: qty 2

## 2023-12-25 MED ORDER — NOREPINEPHRINE 4 MG/250ML-% IV SOLN: 0.0000 ug/min | INTRAVENOUS | Status: DC

## 2023-12-25 MED ORDER — CHLORHEXIDINE GLUCONATE CLOTH 2 % EX PADS
6.0000 | MEDICATED_PAD | Freq: Every day | CUTANEOUS | Status: DC
  Administered 2023-12-25 – 2023-12-26 (×2): 6 via TOPICAL

## 2023-12-25 MED ORDER — METOPROLOL TARTRATE 5 MG/5ML IV SOLN
5.0000 mg | Freq: Two times a day (BID) | INTRAVENOUS | Status: DC
  Administered 2023-12-26: 5 mg via INTRAVENOUS
  Filled 2023-12-25 (×2): qty 5

## 2023-12-25 MED ORDER — DIGOXIN 0.25 MG/ML IJ SOLN
0.2500 mg | Freq: Once | INTRAMUSCULAR | Status: AC
Start: 2023-12-25 — End: 2023-12-25
  Administered 2023-12-25: 0.25 mg via INTRAVENOUS

## 2023-12-25 MED ORDER — INSULIN GLARGINE-YFGN 100 UNIT/ML ~~LOC~~ SOLN
15.0000 [IU] | Freq: Every day | SUBCUTANEOUS | Status: DC
  Administered 2023-12-25 – 2024-01-01 (×8): 15 [IU] via SUBCUTANEOUS
  Filled 2023-12-25 (×8): qty 0.15

## 2023-12-25 MED ORDER — NOREPINEPHRINE 4 MG/250ML-% IV SOLN
2.0000 ug/min | INTRAVENOUS | Status: DC
  Administered 2023-12-25: 2 ug/min via INTRAVENOUS

## 2023-12-25 MED ORDER — ENSURE ENLIVE PO LIQD
237.0000 mL | Freq: Two times a day (BID) | ORAL | Status: DC
  Administered 2023-12-26 – 2023-12-27 (×2): 237 mL via ORAL

## 2023-12-25 MED ORDER — AMIODARONE IV BOLUS ONLY 150 MG/100ML: 150.0000 mg | Freq: Once | INTRAVENOUS | Status: DC

## 2023-12-25 MED ORDER — ADENOSINE 12 MG/4ML IV SOLN
6.0000 mg | INTRAVENOUS | Status: AC
  Administered 2023-12-25: 6 mg via INTRAVENOUS

## 2023-12-25 MED ORDER — AMIODARONE LOAD VIA INFUSION
150.0000 mg | Freq: Once | INTRAVENOUS | Status: AC
  Administered 2023-12-25: 150 mg via INTRAVENOUS
  Filled 2023-12-25: qty 83.34

## 2023-12-25 MED ORDER — MELATONIN 5 MG PO TABS
5.0000 mg | ORAL_TABLET | Freq: Every day | ORAL | Status: DC
  Administered 2023-12-25 – 2023-12-31 (×7): 5 mg via ORAL
  Filled 2023-12-25 (×7): qty 1

## 2023-12-25 MED ORDER — ADENOSINE 12 MG/4ML IV SOLN
12.0000 mg | INTRAVENOUS | Status: AC
  Administered 2023-12-25: 12 mg via INTRAVENOUS

## 2023-12-25 MED ORDER — HEPARIN BOLUS VIA INFUSION
2000.0000 [IU] | Freq: Once | INTRAVENOUS | Status: DC
  Filled 2023-12-25: qty 2000

## 2023-12-25 MED ORDER — HEPARIN (PORCINE) 25000 UT/250ML-% IV SOLN
1000.0000 [IU]/h | INTRAVENOUS | Status: DC
  Filled 2023-12-25: qty 250

## 2023-12-25 MED ORDER — PSEUDOEPHEDRINE HCL ER 120 MG PO TB12
120.0000 mg | ORAL_TABLET | Freq: Two times a day (BID) | ORAL | Status: DC
  Administered 2023-12-25 – 2023-12-26 (×3): 120 mg via ORAL
  Filled 2023-12-25 (×3): qty 1

## 2023-12-25 MED ORDER — NITROGLYCERIN 2 % TD OINT
1.0000 [in_us] | TOPICAL_OINTMENT | Freq: Three times a day (TID) | TRANSDERMAL | Status: AC
  Administered 2023-12-25 – 2023-12-27 (×6): 1 [in_us] via TOPICAL
  Filled 2023-12-25 (×6): qty 1

## 2023-12-25 MED ORDER — NOREPINEPHRINE 4 MG/250ML-% IV SOLN
0.0000 ug/min | INTRAVENOUS | Status: DC
Start: 2023-12-25 — End: 2023-12-25
  Administered 2023-12-25: 2 ug/min via INTRAVENOUS
  Filled 2023-12-25: qty 250

## 2023-12-25 MED ORDER — SODIUM CHLORIDE 0.9 % IV BOLUS
500.0000 mL | Freq: Once | INTRAVENOUS | Status: AC
Start: 2023-12-25 — End: 2023-12-25
  Administered 2023-12-25: 500 mL via INTRAVENOUS

## 2023-12-25 MED ORDER — HEPARIN BOLUS VIA INFUSION
4000.0000 [IU] | Freq: Once | INTRAVENOUS | Status: AC
  Administered 2023-12-25: 4000 [IU] via INTRAVENOUS
  Filled 2023-12-25: qty 4000

## 2023-12-25 NOTE — ED Notes (Addendum)
 1241 - Dr. Dareen Piano messaged via secure chat as well to inform of patient's HR above 180 despite scheduled and PRN metop doses. Awaiting response.   18 - Dr. Dareen Piano responded via secure chat and placed orders for diltiazem gtt. Also notified RN that she did not have a pager and therefore did not receive either page.   1342 - received message via secure chat from Dr. Dareen Piano asking if diltiazem gtt had been started. See MAR for administration times and titration times.   1351 - Dr. Dareen Piano notified via secure chat that patient's HR remained in the 180-200s and therefore this RN was increasing the dose earlier than the [facility] ordered. Dr. Dareen Piano agreed with plan. Also notified that EKG had been obtained.   1433 - Dr. Dareen Piano notified via secure chat that dilt gtt was at max dose (15mg /h) and despite that pt's HR remained between 180-200s, irregular. Patient continuing to deny chest pain or shortness of breath.   1433 - Dr. Dareen Piano messaged this RN via secure chat about pt's BP. Notified that pt's BP at 1400 was 97/82 (MAP 89) and at 1438 BP: 88/60 (MAP 68).  Charge RN at bedside with this RN just prior to 1500 to assess patient. Patient denying chest pain or shortness of breath, but due to patient appearing more pale and continuing to be hypotensive, decision made to call RRT. Dr. Dareen Piano notified of RRT via secure chat at 1504. Patient to ICU by 1520 after two rounds of adenosine per verbal order from Dr. Fran Lowes. This RN transporting patient and bedside report given to ICU RN.

## 2023-12-25 NOTE — ED Notes (Signed)
 [facility] phone number called by this RN to page Dr. Dareen Piano to notify of patient's sustained Afib RVR (HR 180-200s) despite scheduled metoprolol 5mg  and PRN metoprolol 5mg .

## 2023-12-25 NOTE — ED Notes (Addendum)
 1220 - This RN in room to administer medications to patient. Family (daughter and grandson) at bedside with patient. Patient's HR noted to be between 190-200 on the monitor, in atrial fibrillation. Patient had been in afib all morning, with HR between 110-130s.   Scheduled 5mg  metoprolol administered (see MAR) and patient assessed at that time. Pt denying chest pain or shortness of breath.   Dr. Dareen Piano paged via [facility] phone number in chart. Awaiting response at this time.   1235 - PRN metoprolol 5mg  administered to patient due to HR sustaining in 170s.

## 2023-12-25 NOTE — Progress Notes (Signed)
 OT Cancellation Note  Patient Details Name: Laura Bell MRN: [id number] DOB: [birth year]   Cancelled Treatment:    Reason Eval/Treat Not Completed: Patient not medically ready. Chart reviewed, pt currently with red MEWS vital signs with HR above 125 and RR 35 at rest. OT will hold eval at this time and see pt when medically appropriate. Secure chat sent to MD for update.   Rehema Muffley L. Samina Weekes, OTR/L  12/25/23, 9:09 AM

## 2023-12-25 NOTE — Progress Notes (Signed)
   12/25/23 1400  Spiritual Encounters  Type of Visit Initial  Care provided to: Patient  Conversation partners present during encounter Nurse  Reason for visit Code  OnCall Visit No   Chaplain visited patient's daughter for a RRT.  Daughter was emotional as the experience brought back memories of the passing of her spouse.  Daughter was assured her mother would receive more personal care in the ICU. Chaplain shared with colleagues and follow-up will take place.    Rev. Rana M. Earlene Plater, M.Div. Chaplain Resident [facility]

## 2023-12-25 NOTE — Progress Notes (Addendum)
 PROGRESS NOTE  Laura Bell    DOB: August 13, 1936, 88 y.o.  HYQ:[id number]    Code Status: Full Code   DOA: 12/23/2023   LOS: 1   Brief hospital course  Laura Bell is a 88 y.o. female with medical history significant of HTN, HLD, sCHF with EF 40-45%, depression, anemia, breast cancer, uterus cancer, who presents with SOB.   ED Course: positive PCR for flu A and RSV, WBC 7.8, potassium 3.4, mild AKI with creatinine 0.95, BUN 10 and GFR 59 (baseline creatinine 0.58 on 08/02/2020), lactic acid 2.8, temperature normal, blood pressure 121/72, heart rate 128, RR 18, oxygen saturation 92% on room air, which improved to 99% on 2 L oxygen.  Chest x-ray negative.    XBM:WUXLK rhythm, QTc 469, LAE, low voltage, nonspecific T wave change.  Respiratory status 3/26 difficult and requiring bipap but has stabilized and now doing well on 2L. Treated sinus tach with IV metoprolol with good results. 12/25/23 -today, patient tachycardic again and appears to be in afib RVR now. Treated with metoprolol and then diltizem gtt. Cards was consulted.   UPDATE: patient was not rate controlled with max of dilt drip or metoprolol. BP decreased and MAP remained >60. Rapid response was called.  She received 2 doses adenosine without conversion. Patient remained alert and asymptomatic throughout this process.  Switched to amiodarone bolus and started levophed.  Heparin was ordered for afib RVR.  She was transported to ICU and with amio bolus and digoxin, she converted to sinus tach rate 110s. BP improved with MAP >90.  She will remain on amiodarone gtt and norepi can be titrated down as able.  Echo is ordered.  She remains ORA.   Assessment & Plan  Principal Problem:   Influenza A Active Problems:   RSV (respiratory syncytial virus pneumonia)   Elevated lactic acid level   Chronic systolic CHF (congestive heart failure) (HCC)   Hyperlipidemia associated with type 2 diabetes mellitus (HCC)   Diabetes  mellitus without complication (HCC)   Essential hypertension   AKI (acute kidney injury) (HCC)   Depression   Hypokalemia   Hypophosphatemia   Overweight (BMI 25.0-29.9)  Acute hypoxic respiratory failure Influenza A and RSV infection:  O2 sats are stable on room air now. Improved s/p short period of bipap yesterday.  -Bronchodilators and prn Mucinex -Solu-Medrol 40 mg daily -Incentive spirometry -Tamiflu 30 mg twice daily - CTA -> negative for PE  Sinus tachycardia>> now showing afib RVR on ECG. No h/o same- unable to control on metoprolol.  - added cardizem gtt.  - cardiology consulted - hold off on echo given rapid heart rate until better controlled   Chronic systolic CHF: 2D echo on 05/30/2019 showed EF of 40-45%.  Patient does not have leg edema or JVD.  CHF seem to be compensated. No significant pulmonary edema on imaging -BNP is elevated at 2941, likely due to 2 L of IV fluid given in ED - s/p lasix without huge response yet   Hyperlipidemia associated with type 2 diabetes mellitus (HCC) -Zocor   Diabetes mellitus without complication (HCC): Most recent A1c 11.7, poorly controlled.  Patient taking metformin and glipizide at home -Sliding scale insulin   Essential hypertension- borderline low -metoprolol -Hold lisinopril due to mild AKI   AKI: Possibly due to dehydration- stable -Hold lisinopril   Depression -Celexa   Hypokalemia and hypophosphatemia: Potassium 3.4, phosphorus 2.3, magnesium 2.0 -Repleted potassium and phosphorus   Overweight: Body weight 72.3 kg, BMI 29.17 -  Encourage losing weight -Exercise and healthy diet  Body mass index is 29.17 kg/m.  VTE ppx: enoxaparin (LOVENOX) injection 40 mg Start: 12/23/23 2100  Diet:     Diet   Diet Carb Modified Fluid consistency: Thin; Room service appropriate? Yes   Consultants: None   Subjective 12/25/23    Pt reports no shortness of breath or chest pain. No palpitations. Complains of dry mouth.     Objective   Vitals:   12/25/23 0430 12/25/23 0502 12/25/23 0504 12/25/23 0600  BP: 123/84   118/81  Pulse: (!) 117 (!) 118  (!) 125  Resp: (!) 26 (!) 28  (!) 23  Temp:   98.3 F (36.8 C)   TempSrc:   Oral   SpO2: 96% 96%  95%  Weight:      Height:        Intake/Output Summary (Last 24 hours) at 12/25/2023 0801 Last data filed at 12/24/2023 1836 Gross per 24 hour  Intake --  Output 900 ml  Net -900 ml   Filed Weights   12/23/23 1828  Weight: 72.3 kg     Physical Exam:  General: alert and resting comfortably in bed Respiratory: normal respiratory effort. No wheezing or rales Cardiovascular: quick capillary refill, normal S1/S2, rapid heart rate, normal rhythm, no JVD, murmurs Nervous: A&O x3. no gross focal neurologic deficits, normal speech Extremities: moves all equally, no edema, normal tone Skin: dry, intact, normal temperature, normal color. No rashes, lesions or ulcers on exposed skin  Labs   I have personally reviewed the following labs and imaging studies CBC    Component Value Date/Time   WBC 15.4 (H) 12/25/2023 0600   RBC 3.94 12/25/2023 0600   HGB 9.7 (L) 12/25/2023 0600   HGB 12.9 11/26/2013 1445   HCT 29.8 (L) 12/25/2023 0600   HCT 39.5 11/26/2013 1445   PLT 442 (H) 12/25/2023 0600   PLT 273 11/26/2013 1445   MCV 75.6 (L) 12/25/2023 0600   MCV 87.6 11/26/2013 1445   MCH 24.6 (L) 12/25/2023 0600   MCHC 32.6 12/25/2023 0600   RDW 18.1 (H) 12/25/2023 0600   RDW 13.3 11/26/2013 1445   LYMPHSABS 0.6 (L) 12/23/2023 1836   LYMPHSABS 2.0 11/26/2013 1445   MONOABS 0.7 12/23/2023 1836   MONOABS 0.7 11/26/2013 1445   EOSABS 0.1 12/23/2023 1836   EOSABS 0.3 11/26/2013 1445   BASOSABS 0.0 12/23/2023 1836   BASOSABS 0.1 11/26/2013 1445      Latest Ref Rng & Units 12/25/2023    6:00 AM 12/24/2023    4:21 AM 12/23/2023    6:36 PM  BMP  Glucose 70 - 99 mg/dL 914  782  956   BUN 8 - 23 mg/dL 18  11  10    Creatinine 0.44 - 1.00 mg/dL 2.13  0.86  5.78    Sodium 135 - 145 mmol/L 134  132  130   Potassium 3.5 - 5.1 mmol/L 3.4  4.1  3.4   Chloride 98 - 111 mmol/L 98  100  95   CO2 22 - 32 mmol/L 19  23  21    Calcium 8.9 - 10.3 mg/dL 8.6  8.5  8.6     CT Angio Chest Pulmonary Embolism (PE) W or WO Contrast Result Date: 12/23/2023 CLINICAL DATA:  Shortness of breath for a few days, initial encounter EXAM: CT ANGIOGRAPHY CHEST WITH CONTRAST TECHNIQUE: Multidetector CT imaging of the chest was performed using the standard protocol during bolus administration of intravenous contrast. Multiplanar  CT image reconstructions and MIPs were obtained to evaluate the vascular anatomy. RADIATION DOSE REDUCTION: This exam was performed according to the departmental dose-optimization program which includes automated exposure control, adjustment of the mA and/or kV according to patient size and/or use of iterative reconstruction technique. CONTRAST:  75mL OMNIPAQUE IOHEXOL 350 MG/ML SOLN COMPARISON:  10/04/2007 FINDINGS: Cardiovascular: Atherosclerotic calcifications of the thoracic aorta are noted without aneurysmal dilatation. The degree of opacification is limited precluding evaluation for dissection. The pulmonary artery shows a normal branching pattern bilaterally. No filling defect to suggest pulmonary embolism is noted. Coronary calcifications are seen. The heart is not significantly enlarged in size. Mediastinum/Nodes: Thoracic inlet is within normal limits. No hilar or mediastinal adenopathy is noted. The esophagus as visualized is within normal limits. Lungs/Pleura: Lungs are well aerated bilaterally. No focal confluent infiltrate is seen. Mild emphysematous changes are noted. Some bronchial thickening is noted within the right lower lobe. No sizable parenchymal nodule is noted. Upper Abdomen: Visualized upper abdomen shows no acute abnormality. Musculoskeletal: No acute rib abnormality is noted. Review of the MIP images confirms the above findings. IMPRESSION: No  evidence of pulmonary emboli. Mild bronchial thickening is noted particularly in the right lower lobe without focal infiltrate. Aortic Atherosclerosis (ICD10-I70.0) and Emphysema (ICD10-J43.9). Electronically Signed   By: Alcide Clever M.D.   On: 12/23/2023 21:48   DG Chest Port 1 View Result Date: 12/23/2023 CLINICAL DATA:  Shortness of breath. EXAM: PORTABLE CHEST 1 VIEW COMPARISON:  June 29, 2019. FINDINGS: Stable cardiomediastinal silhouette. Both lungs are clear. The visualized skeletal structures are unremarkable. IMPRESSION: No active disease. Electronically Signed   By: Lupita Raider M.D.   On: 12/23/2023 19:07    Disposition Plan & Communication  Patient status: Inpatient  Admitted From: Home Planned disposition location: Skilled nursing facility Anticipated discharge date: TBD pending clinical improvement  Family Communication: none at bedside    Author: Leeroy Bock, DO [facility] 12/25/2023, 8:01 AM   Available by Epic secure chat 7AM-7PM. If 7PM-7AM, please contact night-coverage.  [facility] contact information found on [email or web address].
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
## 2023-12-25 NOTE — Significant Event (Signed)
 Rapid Response Event Note   Reason for Call :  tachycardia  Initial Focused Assessment:  Rapid response team arrived in patient's room with patient surrounded by ED staff and [facility] RNs. One ED RN starting third IV and patient already connected to defibrillator pads. Patient's HR in 200s. BP with SBP in 80s but MAPs 70-80s. Patient reports no shortness of breath or chest pain. Patient's skin pale but dry. Oxygen saturations mid 90s on room air. Dr. Fran Lowes arrived at bedside while patient's attending Dr. Dareen Piano on the way.  Interventions:  Per orders from Dr. Fran Lowes, 500 mL bolus of NS started, 6 mg of adenosine given at 13:11, and 12 mg of adenosine given at 13:13. Patient transferred to ICU 11.  Plan of Care:  Once in ICU Dr. Mariah Milling and Charlsie Quest NP at bedside followed after by Dr. Dareen Piano. Dr. Mariah Milling ordered cardizem infusion discontinued, 150 mg bolus of amiodarone to be given over 20 minutes instead of 10 minutes, and 0.25 mg of IV digoxin given. ICU RN, Irving Burton, received hand off from ED RN. Cardiology team and Dr. Dareen Piano reviewing orders and making further changes as needed.  Event Summary:   MD Notified: Dr. Fran Lowes, Dr. Dareen Piano, Dr. Mariah Milling and Charlsie Quest NP Call Time: 15:02 Arrival Time: 15:04 End Time: 15:26 (arrival in ICU)  Bennie Dallas, RN

## 2023-12-25 NOTE — Progress Notes (Signed)
 L FA IV which had levophed running in it for a short period of time possibly infiltrated. Pt called out that site was starting to become painful. 5 cm area of mild edema noted on assessment and IV did not have blood return. IV aspirated and removed. Site elevated and marked. Warm compress applied. Reversal drugs administered. MD made aware.

## 2023-12-25 NOTE — Progress Notes (Signed)
 PT Cancellation Note  Patient Details Name: Laura Bell MRN: [id number] DOB: [birth year]   Cancelled Treatment:     PT  evaluation and treatment  not complete due to pt  maintains HR  >125 BPM  and RR 35 at rest. Pt at RA now maintaining ~95% O2 at RA.  PT communicated with Nurse/OT and MD about it. Pt on Hold for PT eval today. Will attempt again tomorrow.    Denis Carreon H Shon Mansouri 12/25/2023, 9:09 AM

## 2023-12-25 NOTE — ED Notes (Signed)
 This RN notified charge RN of patient's HR 180-200s. Charge RN also notified [facility] and this RN instructed to have Diplomatic Services operational officer to page Dr. Dareen Piano again.

## 2023-12-25 NOTE — Progress Notes (Signed)
   12/25/23 1540  Spiritual Encounters  Type of Visit Follow up  Care provided to: Pt and family (Son and Daughter at bedside)  Orlene Erm partners present during Programmer, systems  Referral source Chaplain team  Reason for visit  (Follow up for a Rapid Response in the ED)  OnCall Visit Yes  Spiritual Framework  Presenting Themes Meaning/purpose/sources of inspiration;Impactful experiences and emotions;Coping tools;Courage hope and growth  Patient Stress Factors Exhausted;Health changes  Family Stress Factors Other (Comment) (Family concerned about their mother "I've never seen her looking like this")  Interventions  Spiritual Care Interventions Made Established relationship of care and support;Compassionate presence;Reflective listening;Normalization of emotions;Encouragement  Intervention Outcomes  Outcomes Connection to spiritual care;Awareness around self/spiritual resourses;Awareness of support
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
## 2023-12-25 NOTE — ED Notes (Signed)
 Pt restless in the bed and taking gown off gown

## 2023-12-26 ENCOUNTER — Inpatient Hospital Stay (HOSPITAL_COMMUNITY)
Admit: 2023-12-26 | Discharge: 2023-12-26 | Disposition: A | Discharge status: Home / Self Care | Attending: Cardiology | Admitting: Cardiology

## 2023-12-26 DIAGNOSIS — I4891 Unspecified atrial fibrillation: Secondary | ICD-10-CM

## 2023-12-26 DIAGNOSIS — J9601 Acute respiratory failure with hypoxia: Secondary | ICD-10-CM | POA: Diagnosis not present

## 2023-12-26 DIAGNOSIS — J101 Influenza due to other identified influenza virus with other respiratory manifestations: Secondary | ICD-10-CM | POA: Diagnosis not present

## 2023-12-26 DIAGNOSIS — I5023 Acute on chronic systolic (congestive) heart failure: Secondary | ICD-10-CM | POA: Diagnosis not present

## 2023-12-26 DIAGNOSIS — J121 Respiratory syncytial virus pneumonia: Secondary | ICD-10-CM | POA: Diagnosis not present

## 2023-12-26 LAB — ECHOCARDIOGRAM COMPLETE
AR max vel: 1.87 cm2
AV Area VTI: 1.9 cm2
AV Area mean vel: 1.71 cm2
AV Mean grad: 4 mmHg
AV Peak grad: 6.7 mmHg
Ao pk vel: 1.29 m/s
Area-P 1/2: 6.6 cm2
Calc EF: 28.4 %
Height: 62 in
MV VTI: 1.97 cm2
S' Lateral: 4 cm
Single Plane A2C EF: 29.4 %
Single Plane A4C EF: 28.1 %
Weight: 2497.37 [oz_av]

## 2023-12-26 LAB — GLUCOSE, CAPILLARY
Glucose-Capillary: 330 mg/dL — ABNORMAL HIGH (ref 70–99)
Glucose-Capillary: 265 mg/dL — ABNORMAL HIGH (ref 70–99)
Glucose-Capillary: 336 mg/dL — ABNORMAL HIGH (ref 70–99)

## 2023-12-26 LAB — CBC
HCT: 26.5 % — ABNORMAL LOW (ref 36.0–46.0)
Hemoglobin: 8.5 g/dL — ABNORMAL LOW (ref 12.0–15.0)
MCH: 24 pg — ABNORMAL LOW (ref 26.0–34.0)
MCHC: 32.1 g/dL (ref 30.0–36.0)
MCV: 74.9 fL — ABNORMAL LOW (ref 80.0–100.0)
Platelets: 366 10*3/uL (ref 150–400)
RBC: 3.54 MIL/uL — ABNORMAL LOW (ref 3.87–5.11)
RDW: 18.4 % — ABNORMAL HIGH (ref 11.5–15.5)
WBC: 12.6 10*3/uL — ABNORMAL HIGH (ref 4.0–10.5)
nRBC: 0.2 % (ref 0.0–0.2)

## 2023-12-26 LAB — BASIC METABOLIC PANEL WITH GFR
Anion gap: 11 (ref 5–15)
BUN: 26 mg/dL — ABNORMAL HIGH (ref 8–23)
CO2: 24 mmol/L (ref 22–32)
Calcium: 8.2 mg/dL — ABNORMAL LOW (ref 8.9–10.3)
Chloride: 100 mmol/L (ref 98–111)
Creatinine, Ser: 1.36 mg/dL — ABNORMAL HIGH (ref 0.44–1.00)
GFR, Estimated: 38 mL/min — ABNORMAL LOW (ref >60)
Glucose, Bld: 292 mg/dL — ABNORMAL HIGH (ref 70–99)
Potassium: 3.6 mmol/L (ref 3.5–5.1)
Sodium: 135 mmol/L (ref 135–145)

## 2023-12-26 LAB — HEPARIN LEVEL (UNFRACTIONATED)
Heparin Unfractionated: 0.58 [IU]/mL (ref 0.30–0.70)
Heparin Unfractionated: 0.69 [IU]/mL (ref 0.30–0.70)

## 2023-12-26 MED ORDER — CHLORHEXIDINE GLUCONATE CLOTH 2 % EX PADS
6.0000 | MEDICATED_PAD | Freq: Every day | CUTANEOUS | Status: DC
  Administered 2023-12-26: 6 via TOPICAL

## 2023-12-26 MED ORDER — IPRATROPIUM-ALBUTEROL 0.5-2.5 (3) MG/3ML IN SOLN
3.0000 mL | Freq: Three times a day (TID) | RESPIRATORY_TRACT | Status: DC
  Administered 2023-12-26 – 2023-12-29 (×10): 3 mL via RESPIRATORY_TRACT
  Filled 2023-12-26 (×11): qty 3

## 2023-12-26 MED ORDER — PREDNISONE 20 MG PO TABS
20.0000 mg | ORAL_TABLET | Freq: Every day | ORAL | Status: DC
  Administered 2023-12-26: 20 mg via ORAL
  Filled 2023-12-26: qty 1

## 2023-12-26 MED ORDER — POTASSIUM CHLORIDE CRYS ER 20 MEQ PO TBCR
40.0000 meq | EXTENDED_RELEASE_TABLET | Freq: Once | ORAL | Status: AC
  Administered 2023-12-26: 40 meq via ORAL
  Filled 2023-12-26: qty 2

## 2023-12-26 MED ORDER — AMIODARONE HCL 200 MG PO TABS
200.0000 mg | ORAL_TABLET | Freq: Two times a day (BID) | ORAL | Status: DC
  Administered 2023-12-26 – 2024-01-01 (×12): 200 mg via ORAL
  Filled 2023-12-26 (×12): qty 1

## 2023-12-26 MED ORDER — AMIODARONE HCL 200 MG PO TABS: 200.0000 mg | ORAL_TABLET | Freq: Two times a day (BID) | ORAL | Status: DC

## 2023-12-26 MED ORDER — OSELTAMIVIR PHOSPHATE 30 MG PO CAPS
30.0000 mg | ORAL_CAPSULE | Freq: Every day | ORAL | Status: AC
  Administered 2023-12-27 – 2023-12-28 (×2): 30 mg via ORAL
  Filled 2023-12-26 (×2): qty 1

## 2023-12-26 MED ORDER — METOPROLOL TARTRATE 25 MG PO TABS
12.5000 mg | ORAL_TABLET | Freq: Two times a day (BID) | ORAL | Status: DC
  Administered 2023-12-26: 12.5 mg via ORAL
  Filled 2023-12-26: qty 1

## 2023-12-26 NOTE — Progress Notes (Signed)
*  PRELIMINARY RESULTS* Echocardiogram 2D Echocardiogram has been performed.  Carolyne Fiscal 12/26/2023, 12:17 PM

## 2023-12-26 NOTE — Evaluation (Signed)
 Occupational Therapy Evaluation Patient Details Name: Laura Bell MRN: [id number] DOB: 1936/03/24 Today's Date: 12/26/2023   History of Present Illness   Pt is a 88 year old female presents with SOB, work up including Influenza A, RSV with acute hypoxic respiratory failure, afib RVR, AKI, Hypokalemia and hypophosphatemia    PMH significant for  HTN, HLD, sCHF with EF 40-45%, depression, anemia, breast cancer, uterus cancer     Clinical Impressions Chart reviewed, pt greeted in room with 2 daughter present. Pt is oriented to self, pleasant and agreeable with increased time for one step directions. Pt family reports cognition appears below baseline. Pt daughter reports pt lives at [facility] but amb around her room with MOD I with the rollator, performs sponge baths, dressing, feeding/grooming with MOD I, has assist for a shower and med management through staff. Food is brought to her room. Pt presents with deficits in strength, endurance, activity tolerance, balance, affecting safe and optimal ADL completion. Bed mobility completed with CGA-MIN A. Pt is fatigued after approx 1 minute sitting on edge of bed, requests to return to bed. HR 131 bpm with mobility. Pt is performing ADL below PLOF, will benefit from acute OT to address functional deficits and to facilitate optimal ADL performance. OT will follow acutely.      If plan is discharge home, recommend the following:   A lot of help with walking and/or transfers;A lot of help with bathing/dressing/bathroom;Assistance with cooking/housework;Help with stairs or ramp for entrance;Supervision due to cognitive status     Functional Status Assessment   Patient has had a recent decline in their functional status and demonstrates the ability to make significant improvements in function in a reasonable and predictable amount of time.     Equipment Recommendations   Other (comment) (defer to next venue of care)     Recommendations for  Other Services         Precautions/Restrictions   Precautions Precautions: Fall Recall of Precautions/Restrictions: Impaired Restrictions Weight Bearing Restrictions Per Provider Order: No     Mobility Bed Mobility Overal bed mobility: Needs Assistance Bed Mobility: Supine to Sit     Supine to sit: Contact guard, Min assist, HOB elevated          Transfers                   General transfer comment: pt generally fatigued on this date, requests to return to bed therefore not attempted      Balance Overall balance assessment: Needs assistance Sitting-balance support: Feet supported Sitting balance-Leahy Scale: Fair                                     ADL either performed or assessed with clinical judgement   ADL Overall ADL's : Needs assistance/impaired                     Lower Body Dressing: Maximal assistance;Bed level Lower Body Dressing Details (indicate cue type and reason): donn socks                     Vision Patient Visual Report: No change from baseline       Perception         Praxis         Pertinent Vitals/Pain Pain Assessment Pain Assessment: No/denies pain     Extremity/Trunk Assessment Upper Extremity Assessment  Upper Extremity Assessment: Generalized weakness   Lower Extremity Assessment Lower Extremity Assessment: Generalized weakness       Communication Communication Communication: No apparent difficulties   Cognition Arousal: Alert Behavior During Therapy: WFL for tasks assessed/performed Cognition: Cognition impaired   Orientation impairments: Place, Time, Situation Awareness: Intellectual awareness impaired, Online awareness impaired Memory impairment (select all impairments): Short-term memory, Working [employer] fast streamer, Non-declarative long-term memory, Geneticist, molecular long-term memory Attention impairment (select first level of impairment): Sustained attention Executive functioning  impairment (select all impairments): Initiation, Organization, Sequencing, Reasoning, Problem solving OT - Cognition Comments: pt daugther reports cognition is not baseline right now. Pt is pleasant and participatory with increased time for one step directions                 Following commands: Impaired Following commands impaired: Follows one step commands inconsistently, Follows one step commands with increased time     Cueing  General Comments   Cueing Techniques: Verbal cues;Gestural cues;Tactile cues;Visual cues  HR up to 131 bpm while sitting on edge of bed, spo2 >90% on RA throughout   Exercises Other Exercises Other Exercises: edu pt re: role of OT, role of rehab, discharge recommendations   Shoulder Instructions      Home Living Family/patient expects to be discharged to:: Skilled nursing facility                                 Additional Comments: [facility]      Prior Functioning/Environment Prior Level of Function : Independent/Modified Independent;Needs assist             Mobility Comments: amb with rollator limited distances around facility ADLs Comments: Pt performs dressing, sponge baths, feeding/grooming with set up-MOD I; assist for showering 2x a week, IADLs (bring food to her room/meds)    OT Problem List: Decreased strength;Decreased activity tolerance;Decreased knowledge of use of DME or AE;Impaired balance (sitting and/or standing);Decreased safety awareness;Decreased cognition   OT Treatment/Interventions: Self-care/ADL training;Therapeutic exercise;Patient/family education;Balance training;Energy conservation;Therapeutic activities;DME and/or AE instruction      OT Goals(Current goals can be found in the care plan section)   Acute Rehab OT Goals Patient Stated Goal: improve function OT Goal Formulation: With patient Time For Goal Achievement: 01/09/24 Potential to Achieve Goals: Good ADL Goals Pt Will Perform  Grooming: with supervision;sitting Pt Will Perform Lower Body Dressing: with supervision;sitting/lateral leans;sit to/from stand Pt Will Transfer to Toilet: with supervision;ambulating Pt Will Perform Toileting - Clothing Manipulation and hygiene: with supervision;sit to/from stand;sitting/lateral leans   OT Frequency:  Min 2X/week    Co-evaluation PT/OT/SLP Co-Evaluation/Treatment: Yes Reason for Co-Treatment: For patient/therapist safety;Necessary to address cognition/behavior during functional activity;To address functional/ADL transfers;Complexity of the patient's impairments (multi-system involvement) PT goals addressed during session: Mobility/safety with mobility OT goals addressed during session: ADL's and self-care      AM-PAC OT "6 Clicks" Daily Activity     Outcome Measure Help from another person eating meals?: A Little Help from another person taking care of personal grooming?: A Little Help from another person toileting, which includes using toliet, bedpan, or urinal?: A Lot Help from another person bathing (including washing, rinsing, drying)?: A Lot Help from another person to put on and taking off regular upper body clothing?: A Little Help from another person to put on and taking off regular lower body clothing?: A Lot 6 Click Score: 15   End of Session Nurse Communication: Mobility status  Activity Tolerance: Patient tolerated treatment well;Patient limited by fatigue Patient left: in bed;with call bell/phone within reach;with bed alarm set;with family/visitor present  OT Visit Diagnosis: Other abnormalities of gait and mobility (R26.89);Muscle weakness (generalized) (M62.81)                Time: 1610-9604 OT Time Calculation (min): 14 min Charges:  OT General Charges $OT Visit: 1 Visit OT Evaluation $OT Eval Moderate Complexity: 1 Mod  Oleta Mouse, OTD OTR/L  12/26/23, 3:19 PM

## 2023-12-26 NOTE — Progress Notes (Signed)
 Spoke with cardiology on call, HR down to 110's currently. Nothing else to do at this time.

## 2023-12-26 NOTE — Progress Notes (Signed)
 Patient Name: Laura Bell Date of Encounter: 12/26/2023  [facility] Cardiologist: Julien Nordmann, MD   Interval Summary  .    Patient seen on AM rounds.  Denies any chest pain or shortness of breath.  She also denies any palpitations or noted.  Sitting upright in bed attempting to eat breakfast.  Continues to remain in sinus tach with occasional unifocal PVCs on telemetry monitoring overnight.  Vital Signs .    Vitals:   12/26/23 0400 12/26/23 0500 12/26/23 0521 12/26/23 0800  BP: 101/61   108/69  Pulse: (!) 104   (!) 116  Resp: (!) 21   (!) 24  Temp:    98.7 F (37.1 C)  TempSrc:    Axillary  SpO2: 96%  99% 98%  Weight:  70.8 kg    Height:        Intake/Output Summary (Last 24 hours) at 12/26/2023 0935 Last data filed at 12/26/2023 0300 Gross per 24 hour  Intake 972.54 ml  Output --  Net 972.54 ml      12/26/2023    5:00 AM 12/25/2023    3:50 PM 12/23/2023    6:28 PM  Last 3 Weights  Weight (lbs) 156 lb 1.4 oz 158 lb 11.7 oz 159 lb 8 oz  Weight (kg) 70.8 kg 72 kg 72.349 kg      Telemetry/ECG    Sinus tachycardia rates of 110-120 with occasional unifocal PVC's - Personally Reviewed  Physical Exam .   GEN: No acute distress.   Neck: No JVD Cardiac: RRR, tachycardiac, no murmurs, rubs, or gallops.  Respiratory: Clear to auscultation bilaterally. GI: Soft, nontender, non-distended  MS: No edema  Assessment & Plan .     New onset atrial fibrillation RVR -Prior history of sinus tachycardia -Arrived to the emergency department sinus tach converted to atrial fibrillation sometimes over evening of admission -likely driven by respiratory status are active viral infection -Treated with 5 mg of IV metoprolol without improvement -Started on diltiazem drip which had to be stopped due to hypotension -Heart rate likely exacerbated to acute hypoxic respiratory failure with flu A and RSV infection -Started on heparin infusion for CHA2DS2-VASc score of at  least 6 for stroke prophylaxis, will need to be transition to oral anticoagulant prior to discharge -Bolused with IV amiodarone and started on amiodarone infusion per protocol -Continued on 5 mg of IV metoprolol as needed for elevated heart rates every 12 hours scheduled IV metoprolol discontinued and patient started on 12.5 mg of tartrate twice daily -TSH 0.630 -Continue with telemetry monitoring  Acute hypoxic respiratory failure secondary to influenza A and RSV -Presented with noted hypoxia -Previously was on BiPAP transition to nasal cannula now remains on room air -Respiratory PCR positive for flu A and RSV -Continued on steroids, Tamiflu, bronchodilators -CTA of the chest negative for PE -Continued on antivirals -Supportive care -Ongoing management per IM  Acute on chronic HFrEF -Prior echocardiogram from 2020 revealed LVEF of 40-45% -Repeat echocardiogram ordered and pending with further recommendations to follow -BNP on admission 2941.3 possibly secondary to IV fluids -Previously been on furosemide 40 mg IV twice daily but discontinued due to hypotension -Will likely require further diuresis once improvement in blood pressures -Heart failure education -Daily weights and I's and O's  Hypertension with a history of hypertension -Blood pressure 108/69 -No longer on pressors -Vital signs per unit protocol  AKI -serum creatinine 1.36 -baseline serum creatine 0.5-0.9 -likely secondary to a-fib rvr, hypotension, and tachycardia -can not  rule out cardiorenal component -monitor urine output -daily BMP -avoid nephrotoxic agents where able  Hyperlipidemia -Continue on simvastatin  Type 2 diabetes -Poorly controlled with a hemoglobin A1c of 11.7 -Continued on sliding scale -Ongoing management per IM For questions or updates, please contact [facility] Please consult [email or web address] for contact info under        Signed, Tashika Goodin, NP

## 2023-12-26 NOTE — Plan of Care (Signed)
  Problem: Clinical Measurements: Goal: Ability to maintain clinical measurements within normal limits will improve Outcome: Progressing Goal: Diagnostic test results will improve Outcome: Progressing Goal: Respiratory complications will improve Outcome: Progressing   Problem: Activity: Goal: Risk for activity intolerance will decrease Outcome: Not Progressing   Problem: Nutrition: Goal: Adequate nutrition will be maintained Outcome: Not Progressing   Problem: Coping: Goal: Level of anxiety will decrease Outcome: Progressing   Problem: Elimination: Goal: Will not experience complications related to urinary retention Outcome: Not Progressing   Problem: Pain Managment: Goal: General experience of comfort will improve and/or be controlled Outcome: Progressing   Problem: Safety: Goal: Ability to remain free from injury will improve Outcome: Progressing   Problem: Skin Integrity: Goal: Risk for impaired skin integrity will decrease Outcome: Not Progressing
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
## 2023-12-26 NOTE — Inpatient Diabetes Management (Signed)
 Inpatient Diabetes Program Recommendations  AACE/ADA: New Consensus Statement on Inpatient Glycemic Control (2015)  Target Ranges:  Prepandial:   less than 140 mg/dL      Peak postprandial:   less than 180 mg/dL (1-2 hours)      Critically ill patients:  140 - 180 mg/dL    Latest Reference Range & Units 12/25/23 10:43 12/25/23 17:20 12/26/23 07:46  Glucose-Capillary 70 - 99 mg/dL 161 (H)  7 units Novolog  15 units Semglee  262 (H)  5 units Novolog  330 (H)  (H): Data is abnormally high     Home DM Meds: Lantus 5 units at HS     Humalog 0-5 units BID per SSI      Januvia 50 mg daily      Metformin 500 mg BID   Current Orders: Semglee 15 units daily     Novolog Sensitive Correction Scale/ SSI (0-9 units) TID AC    MD- Note pt getting Solumedrol 40 mg Daily  CBG 330 this AM  If pt to remain on Steroids, please consider:  1. Increase Semglee to 20 units daily  2. Increase the Novolog SSI to the 0-15 unit Moderate scale (may want to increase the frequency as well to Q4 hour coverage while pt getting steroids)   --Will follow patient during hospitalization--  Ambrose Finland RN, MSN, CDCES Diabetes Coordinator Inpatient Glycemic Control Team Team Pager: [phone] (8a-5p)

## 2023-12-26 NOTE — Progress Notes (Signed)
 PROGRESS NOTE  Laura Bell    DOB: 1936/09/16, 88 y.o.  WUJ:[id number]    Code Status: Full Code   DOA: 12/23/2023   LOS: 2   Brief hospital course  Laura Bell is a 88 y.o. female with medical history significant of HTN, HLD, sCHF with EF 40-45%, depression, anemia, breast cancer, uterus cancer, who presents with SOB.   ED Course: positive PCR for flu A and RSV, WBC 7.8, potassium 3.4, mild AKI with creatinine 0.95, BUN 10 and GFR 59 (baseline creatinine 0.58 on 08/02/2020), lactic acid 2.8, temperature normal, blood pressure 121/72, heart rate 128, RR 18, oxygen saturation 92% on room air, which improved to 99% on 2 L oxygen.  Chest x-ray negative.    NFA:OZHYQ rhythm, QTc 469, LAE, low voltage, nonspecific T wave change.  Respiratory status 3/26 difficult and requiring bipap but has stabilized and now doing well on 2L. Treated sinus tach with IV metoprolol with good results. 12/26/23 -today, patient tachycardic again and appears to be in afib RVR now. Treated with metoprolol and then diltizem gtt. Cards was consulted.   UPDATE: patient now reasonably well controlled and stable on amiodarone drip in mild sinus tachycardia. Stable ORA. Denies chest pain. Alert and oriented. Echo pending.  Assessment & Plan  Principal Problem:   Influenza A Active Problems:   RSV (respiratory syncytial virus pneumonia)   Elevated lactic acid level   Chronic systolic CHF (congestive heart failure) (HCC)   Hyperlipidemia associated with type 2 diabetes mellitus (HCC)   Diabetes mellitus without complication (HCC)   Essential hypertension   AKI (acute kidney injury) (HCC)   Depression   Hypokalemia   Hypophosphatemia   Overweight (BMI 25.0-29.9)   Hypoxia   RSV (acute bronchiolitis due to respiratory syncytial virus)   SOB (shortness of breath)   Atrial fibrillation with RVR (HCC)   Sepsis (HCC)  Acute hypoxic respiratory failure Influenza A and RSV infection:  O2 sats are stable on  room air now after short time on bipap.  -Bronchodilators and prn Mucinex -Solu-Medrol 40 mg daily -Incentive spirometry -Tamiflu 30 mg twice daily - CTA -> negative for PE  New onset Afib-RVR: converted to sinus rhythm after started on amiodarone and digoxin. Remains mildly tachycardic.  - cardiology consulted - continue amiodarone - continue heparin gtt - starting PO metoprolol - echo pending  Mild AKI- Cr normal at baseline is 1.36 today. Suspect at least partially from shock and levophed use yesterday - BMP am - encourage PO hydration  Anemia- hgb 9.7>8.5. partially dilutional. On heparin gtt so will watch closely - transfusion threshold <7   Chronic systolic CHF: 2D echo on 05/30/2019 showed EF of 40-45%.  Patient does not have leg edema or JVD.  CHF seem to be compensated. No significant pulmonary edema on imaging -BNP is elevated at 2941 - s/p lasix - f/u echo   Hyperlipidemia associated with type 2 diabetes mellitus (HCC) -Zocor   Diabetes mellitus without complication (HCC): Most recent A1c 11.7, poorly controlled.  Patient taking metformin and glipizide at home -Sliding scale insulin - added long-acting as glucose was elevated in setting of acute steroid use   Essential hypertension- holding home meds as she is currently borderline hypotensive. Was on levophed and with 500cc bolus yesterday but was weaned off yesterday once HR better controlled.     Depression -continue home Celexa   Hypokalemia and hypophosphatemia: Potassium 3.4, phosphorus 2.3, magnesium 2.0 -Repleted   Overweight: Body weight 72.3 kg, BMI  29.17 -Encourage losing weight -Exercise and healthy diet  Body mass index is 28.55 kg/m.  VTE ppx: heparin gtt  Diet:     Diet   Diet Carb Modified Fluid consistency: Thin; Room service appropriate? Yes   Consultants: Cardiology    Subjective 12/26/23    Pt reports no shortness of breath or chest pain. No palpitations. Complains of being  thirsty and hot.    Objective   Vitals:   12/26/23 0300 12/26/23 0400 12/26/23 0500 12/26/23 0521  BP: 109/65 101/61    Pulse: (!) 104 (!) 104    Resp: (!) 22 (!) 21    Temp:      TempSrc:      SpO2: 96% 96%  99%  Weight:   70.8 kg   Height:        Intake/Output Summary (Last 24 hours) at 12/26/2023 0824 Last data filed at 12/26/2023 0300 Gross per 24 hour  Intake 972.54 ml  Output --  Net 972.54 ml   Filed Weights   12/23/23 1828 12/25/23 1550 12/26/23 0500  Weight: 72.3 kg 72 kg 70.8 kg    Physical Exam:  General: alert and resting comfortably in bed. Just completed TTE Respiratory: normal respiratory effort. No wheezing or rales Cardiovascular: quick capillary refill, normal S1/S2, rapid heart rate, normal rhythm, no JVD, murmurs Nervous: A&O x3. no gross focal neurologic deficits, normal speech Extremities: moves all equally, no edema, normal tone Skin: dry, intact, normal temperature, normal color. No rashes, lesions or ulcers on exposed skin  Labs   I have personally reviewed the following labs and imaging studies CBC    Component Value Date/Time   WBC 12.6 (H) 12/26/2023 0138   RBC 3.54 (L) 12/26/2023 0138   HGB 8.5 (L) 12/26/2023 0138   HGB 12.9 11/26/2013 1445   HCT 26.5 (L) 12/26/2023 0138   HCT 39.5 11/26/2013 1445   PLT 366 12/26/2023 0138   PLT 273 11/26/2013 1445   MCV 74.9 (L) 12/26/2023 0138   MCV 87.6 11/26/2013 1445   MCH 24.0 (L) 12/26/2023 0138   MCHC 32.1 12/26/2023 0138   RDW 18.4 (H) 12/26/2023 0138   RDW 13.3 11/26/2013 1445   LYMPHSABS 0.6 (L) 12/23/2023 1836   LYMPHSABS 2.0 11/26/2013 1445   MONOABS 0.7 12/23/2023 1836   MONOABS 0.7 11/26/2013 1445   EOSABS 0.1 12/23/2023 1836   EOSABS 0.3 11/26/2013 1445   BASOSABS 0.0 12/23/2023 1836   BASOSABS 0.1 11/26/2013 1445      Latest Ref Rng & Units 12/26/2023    1:38 AM 12/25/2023    6:00 AM 12/24/2023    4:21 AM  BMP  Glucose 70 - 99 mg/dL 914  782  956   BUN 8 - 23 mg/dL 26  18   11    Creatinine 0.44 - 1.00 mg/dL 2.13  0.86  5.78   Sodium 135 - 145 mmol/L 135  134  132   Potassium 3.5 - 5.1 mmol/L 3.6  3.4  4.1   Chloride 98 - 111 mmol/L 100  98  100   CO2 22 - 32 mmol/L 24  19  23    Calcium 8.9 - 10.3 mg/dL 8.2  8.6  8.5     No results found.   Disposition Plan & Communication  Patient status: Inpatient  Admitted From: Home Planned disposition location: Skilled nursing facility Anticipated discharge date: TBD pending clinical improvement  Family Communication: voicemail left for daughter  Author: Leeroy Bock, DO [facility] 12/26/2023, 8:24  AM   Available by Epic secure chat 7AM-7PM. If 7PM-7AM, please contact night-coverage.  [facility] contact information found on [email or web address].

## 2023-12-26 NOTE — Progress Notes (Signed)
 Gave PRN metoprolol for HR 155. Amio gtt was halved per order @ 2305. HR decreased to 140's with metoprolol, but go back up to 150's.  BP did decrease, but did not have to restart levophed gtt. Made Manuela Schwartz, NP aware and paged cardiology on call per her recommendation @ 2340.

## 2023-12-26 NOTE — Evaluation (Signed)
 Physical Therapy Evaluation Patient Details Name: Laura Bell MRN: [id number] DOB: April 02, 1936 Today's Date: 12/26/2023  History of Present Illness  Pt is a 88 year old female presents with SOB, work up including Influenza A, RSV with acute hypoxic respiratory failure, afib RVR, AKI, Hypokalemia and hypophosphatemia    PMH significant for  HTN, HLD, sCHF with EF 40-45%, depression, anemia, breast cancer, uterus cancer  Clinical Impression  Patient is agreeable to session. Supportive daughters at the bedside. Patient lives at [facility]. She has not recently been getting PT. She is ambulatory with her 4 wheeled walker without assistance. Staff provide assistance for meals, mediation, and showering.  Today the patient required Min A for bed mobility. Heart rate up to 131 bpm with activity. Sitting tolerance is limited to just a few seconds before requesting to return to bed due to fatigue. Recommend to continue PT to maximize independence and facilitate return to prior level of function. Consider rehabilitation at [facility].       If plan is discharge home, recommend the following: A lot of help with walking and/or transfers;A lot of help with bathing/dressing/bathroom;Supervision due to cognitive status;Help with stairs or ramp for entrance;Assistance with cooking/housework   Can travel by private vehicle   No    Equipment Recommendations None recommended by PT  Recommendations for Other Services       Functional Status Assessment Patient has had a recent decline in their functional status and demonstrates the ability to make significant improvements in function in a reasonable and predictable amount of time.     Precautions / Restrictions Precautions Precautions: Fall Recall of Precautions/Restrictions: Impaired Restrictions Weight Bearing Restrictions Per Provider Order: No      Mobility  Bed Mobility Overal bed mobility: Needs Assistance Bed Mobility: Supine to  Sit, Sit to Supine     Supine to sit: Contact guard Sit to supine: Min assist   General bed mobility comments: assistance for LE support    Transfers                   General transfer comment: patient fatigued with very minimal activity. unable to assess standing    Ambulation/Gait                  Stairs            Wheelchair Mobility     Tilt Bed    Modified Rankin (Stroke Patients Only)       Balance Overall balance assessment: Needs assistance Sitting-balance support: Feet supported Sitting balance-Leahy Scale: Fair                                       Pertinent Vitals/Pain Pain Assessment Pain Assessment: No/denies pain    Home Living Family/patient expects to be discharged to:: Skilled nursing facility                   Additional Comments: [facility]    Prior Function Prior Level of Function : Independent/Modified Independent;Needs assist             Mobility Comments: amb with rollator limited distances around facility ADLs Comments: Pt performs dressing, sponge baths, feeding/grooming with set up-MOD I; assist for showering 2x a week, IADLs (bring food to her room/meds)     Extremity/Trunk Assessment   Upper Extremity Assessment Upper Extremity Assessment: Generalized weakness  Lower Extremity Assessment Lower Extremity Assessment: Generalized weakness       Communication   Communication Communication: No apparent difficulties    Cognition Arousal: Alert Behavior During Therapy: WFL for tasks assessed/performed   PT - Cognitive impairments: Orientation   Orientation impairments: Time                   PT - Cognition Comments: patient often repeating herself with some confusion about recent events Following commands: Intact Following commands impaired: Follows one step commands with increased time     Cueing Cueing Techniques: Verbal cues, Gestural cues     General  Comments General comments (skin integrity, edema, etc.): HR up to 131 bpm while sitting on edge of bed, spo2 >90% on RA throughout    Exercises     Assessment/Plan    PT Assessment Patient needs continued PT services  PT Problem List Decreased strength;Decreased range of motion;Decreased activity tolerance;Decreased balance;Decreased mobility;Decreased safety awareness       PT Treatment Interventions DME instruction;Gait training;Stair training;Functional mobility training;Therapeutic activities;Therapeutic exercise;Balance training;Neuromuscular re-education;Cognitive remediation;Patient/family education    PT Goals (Current goals can be found in the Care Plan section)  Acute Rehab PT Goals Patient Stated Goal: to go home PT Goal Formulation: With patient/family Time For Goal Achievement: 01/09/24 Potential to Achieve Goals: Fair    Frequency Min 2X/week     Co-evaluation PT/OT/SLP Co-Evaluation/Treatment: Yes Reason for Co-Treatment: For patient/therapist safety;Necessary to address cognition/behavior during functional activity;To address functional/ADL transfers;Complexity of the patient's impairments (multi-system involvement) PT goals addressed during session: Mobility/safety with mobility OT goals addressed during session: ADL's and self-care       AM-PAC PT "6 Clicks" Mobility  Outcome Measure Help needed turning from your back to your side while in a flat bed without using bedrails?: A Little Help needed moving from lying on your back to sitting on the side of a flat bed without using bedrails?: A Little Help needed moving to and from a bed to a chair (including a wheelchair)?: A Lot Help needed standing up from a chair using your arms (e.g., wheelchair or bedside chair)?: A Lot Help needed to walk in hospital room?: A Lot Help needed climbing 3-5 steps with a railing? : A Lot 6 Click Score: 14    End of Session   Activity Tolerance: Patient limited by  fatigue Patient left: in bed;with call bell/phone within reach;with bed alarm set Nurse Communication: Mobility status PT Visit Diagnosis: Muscle weakness (generalized) (M62.81);Unsteadiness on feet (R26.81)    Time: 2956-2130 PT Time Calculation (min) (ACUTE ONLY): 17 min   Charges:   PT Evaluation $PT Eval High Complexity: 1 High   PT General Charges $$ ACUTE PT VISIT: 1 Visit        Donna Bernard, PT, MPT   Laura Bell 12/26/2023, 3:18 PM

## 2023-12-27 DIAGNOSIS — I480 Paroxysmal atrial fibrillation: Secondary | ICD-10-CM

## 2023-12-27 DIAGNOSIS — J101 Influenza due to other identified influenza virus with other respiratory manifestations: Secondary | ICD-10-CM | POA: Diagnosis not present

## 2023-12-27 DIAGNOSIS — B338 Other specified viral diseases: Secondary | ICD-10-CM

## 2023-12-27 DIAGNOSIS — I5023 Acute on chronic systolic (congestive) heart failure: Secondary | ICD-10-CM

## 2023-12-27 LAB — BASIC METABOLIC PANEL WITH GFR
Anion gap: 10 (ref 5–15)
BUN: 32 mg/dL — ABNORMAL HIGH (ref 8–23)
CO2: 24 mmol/L (ref 22–32)
Calcium: 8.7 mg/dL — ABNORMAL LOW (ref 8.9–10.3)
Chloride: 96 mmol/L — ABNORMAL LOW (ref 98–111)
Creatinine, Ser: 1.22 mg/dL — ABNORMAL HIGH (ref 0.44–1.00)
GFR, Estimated: 43 mL/min — ABNORMAL LOW (ref >60)
Glucose, Bld: 313 mg/dL — ABNORMAL HIGH (ref 70–99)
Potassium: 4.1 mmol/L (ref 3.5–5.1)
Sodium: 130 mmol/L — ABNORMAL LOW (ref 135–145)

## 2023-12-27 LAB — CBC
HCT: 25.8 % — ABNORMAL LOW (ref 36.0–46.0)
Hemoglobin: 8.2 g/dL — ABNORMAL LOW (ref 12.0–15.0)
MCH: 24.3 pg — ABNORMAL LOW (ref 26.0–34.0)
MCHC: 31.8 g/dL (ref 30.0–36.0)
MCV: 76.3 fL — ABNORMAL LOW (ref 80.0–100.0)
Platelets: 346 10*3/uL (ref 150–400)
RBC: 3.38 MIL/uL — ABNORMAL LOW (ref 3.87–5.11)
RDW: 18.5 % — ABNORMAL HIGH (ref 11.5–15.5)
WBC: 14.9 10*3/uL — ABNORMAL HIGH (ref 4.0–10.5)
nRBC: 0.2 % (ref 0.0–0.2)

## 2023-12-27 LAB — GLUCOSE, CAPILLARY
Glucose-Capillary: 321 mg/dL — ABNORMAL HIGH (ref 70–99)
Glucose-Capillary: 199 mg/dL — ABNORMAL HIGH (ref 70–99)
Glucose-Capillary: 198 mg/dL — ABNORMAL HIGH (ref 70–99)

## 2023-12-27 LAB — MRSA NEXT GEN BY PCR, NASAL: MRSA by PCR Next Gen: NOT DETECTED

## 2023-12-27 LAB — HEPARIN LEVEL (UNFRACTIONATED): Heparin Unfractionated: 0.44 [IU]/mL (ref 0.30–0.70)

## 2023-12-27 MED ORDER — ORAL CARE MOUTH RINSE: 15.0000 mL | OROMUCOSAL | Status: DC | PRN

## 2023-12-27 MED ORDER — BOOST / RESOURCE BREEZE PO LIQD CUSTOM
1.0000 | Freq: Two times a day (BID) | ORAL | Status: DC
  Administered 2023-12-28 – 2024-01-01 (×10): 1 via ORAL

## 2023-12-27 MED ORDER — MIDODRINE HCL 5 MG PO TABS
5.0000 mg | ORAL_TABLET | Freq: Three times a day (TID) | ORAL | Status: DC
  Administered 2023-12-27 – 2023-12-29 (×6): 5 mg via ORAL
  Filled 2023-12-27 (×6): qty 1

## 2023-12-27 MED ORDER — METOPROLOL SUCCINATE ER 50 MG PO TB24
50.0000 mg | ORAL_TABLET | Freq: Every day | ORAL | Status: DC
  Administered 2023-12-28: 50 mg via ORAL
  Filled 2023-12-27: qty 1

## 2023-12-27 MED ORDER — METOPROLOL TARTRATE 25 MG PO TABS
25.0000 mg | ORAL_TABLET | Freq: Two times a day (BID) | ORAL | Status: AC
  Administered 2023-12-27 (×2): 25 mg via ORAL
  Filled 2023-12-27 (×2): qty 1

## 2023-12-27 NOTE — Progress Notes (Signed)
 Patient Name: Laura Bell Date of Encounter: 12/27/2023 [facility] Cardiologist: Julien Nordmann, MD   Interval Summary  .    Patient seen on a.m. rounds.  Sleeping sitting upright in bed.  Easily awakens to name calling.  Denies any chest pain or shortness of breath continues to endorse cough.  Heparin infusion continues.  IV amiodarone was transitioned to oral amiodarone on 12/26/2023.  Vital Signs .    Vitals:   12/27/23 0600 12/27/23 0700 12/27/23 0800 12/27/23 0900  BP: 94/70 107/64 111/84 115/72  Pulse: 95 (!) 106 (!) 121 (!) 121  Resp: (!) 21 20 17 20   Temp:   97.8 F (36.6 C)   TempSrc:   Oral   SpO2: 96% 94% 96% 95%  Weight:      Height:        Intake/Output Summary (Last 24 hours) at 12/27/2023 0933 Last data filed at 12/27/2023 0845 Gross per 24 hour  Intake 550.63 ml  Output 850 ml  Net -299.37 ml      12/27/2023    5:00 AM 12/26/2023    5:00 AM 12/25/2023    3:50 PM  Last 3 Weights  Weight (lbs) 154 lb 12.2 oz 156 lb 1.4 oz 158 lb 11.7 oz  Weight (kg) 70.2 kg 70.8 kg 72 kg      Telemetry/ECG    Sinus rhythm to sinus tach rates of 90-1 10- Personally Reviewed  Physical Exam .   GEN: No acute distress.   Neck: No JVD Cardiac: RRR, tachycardic no murmurs, rubs, or gallops.  Respiratory: Clear with diminished bases to auscultation bilaterally.  Respirations are unlabored at rest on room air GI: Soft, nontender, non-distended  MS: No edema  Assessment & Plan .     New onset atrial fibrillation RVR -Prior history of sinus tachycardia -Likely driven by respiratory status -Sudafed was discontinued last evening is likely driving component of elevated heart rates -Continued on heparin infusion for CHA2DS2-VASc score of at least 6 for stroke prophylaxis will need to transition to oral anticoagulant prior to discharge -Continued on amiodarone 200 mg twice daily -Continued on metoprolol tartrate 12.5 mg twice daily that will need to be  consolidated to Toprol-XL prior to discharge -TSH 0.630 -Continue on telemetry monitoring  Acute hypoxic respiratory failure secondary to influenza A RSV infection -Presented with noted hypoxia requiring BiPAP that was transitioned to nasal cannula now on room air -PCR swab positive for flu A and RSV -Continue on bronchodilators, steroids, Tamiflu -CT of the chest negative for PE -Supportive care -Ongoing management per IM  Acute on chronic HFrEF -Prior echocardiogram from 2020 revealed LVEF of 40-45% -Repeat echocardiogram this admission reveals an LVEF of 20-25% -BNP 2941.3 - -299.37 output in the last 24 hours -Previously been on furosemide 40 mg IV twice daily but had to be discontinued due to hypotension, will likely require diuretics once kidney function has improved -Unable to escalate GDMT due to hypotension and AKI -Heart failure education -Daily weights and I's and O's  Hypotension with a history of hypertension -Blood pressure 108/64 -No longer requiring pressors -Continued on midodrine 5 mg 3 times daily -Vital signs per unit protocol  AKI -Serum creatinine 1.22 -Baseline serum creatinine 0.5-0.9 -Likely secondary to A-fib RVR, hypotension or tachycardia -Cannot rule out cardiorenal component -Monitor urine output -Daily BMP -Avoid nephrotoxic agents were able  Hyperlipidemia -Continued on simvastatin  Type 2 diabetes -Poorly controlled hemoglobin A1c of 11.7 -Continued on sliding scale coverage -Ongoing management  per IM  For questions or updates, please contact [facility] Please consult [email or web address] for contact info under        Signed, Amberlie Gaillard, NP

## 2023-12-27 NOTE — Progress Notes (Signed)
 PROGRESS NOTE  Laura Bell    DOB: [birth year], 88 y.o.  ZOX:[id number]    Code Status: Full Code   DOA: 12/23/2023   LOS: 3   Brief hospital course  Laura Bell is a 88 y.o. female with medical history significant of HTN, HLD, sCHF with EF 40-45%, depression, anemia, breast cancer, uterus cancer, who presents with SOB.   ED Course: positive PCR for flu A and RSV, WBC 7.8, potassium 3.4, mild AKI with creatinine 0.95, BUN 10 and GFR 59 (baseline creatinine 0.58 on 08/02/2020), lactic acid 2.8, temperature normal, blood pressure 121/72, heart rate 128, RR 18, oxygen saturation 92% on room air, which improved to 99% on 2 L oxygen.  Chest x-ray negative. Positive for RSV and influenza.    WJX:BJYNW rhythm, QTc 469, LAE, low voltage, nonspecific T wave change.  Respiratory status 3/26 difficult and requiring bipap but has stabilized and now doing well on 2L. Treated sinus tach with IV metoprolol with good results. 3/27: afib RVR refractory to metoprolol or diltizam gtt. Treated with amiodarone gtt and digoxin. Weaned to room air.  3/29: transitioned to PO amiodarone and metoprolol. Continues in mild sinus tachycardia. Denies chest pain or palpitations.   Assessment & Plan  Principal Problem:   Influenza A Active Problems:   RSV (respiratory syncytial virus pneumonia)   Elevated lactic acid level   Acute on chronic systolic heart failure (HCC)   Hyperlipidemia associated with type 2 diabetes mellitus (HCC)   Diabetes mellitus without complication (HCC)   Essential hypertension   AKI (acute kidney injury) (HCC)   Depression   Hypokalemia   Hypophosphatemia   Overweight (BMI 25.0-29.9)   Hypoxia   RSV (acute bronchiolitis due to respiratory syncytial virus)   SOB (shortness of breath)   Atrial fibrillation with RVR (HCC)   Sepsis (HCC)  Acute hypoxic respiratory failure- resolved.  Influenza A and RSV infection:  O2 sats are stable on room air now after short time on  bipap.  -Bronchodilators and prn Mucinex -Incentive spirometry -Tamiflu 30 mg twice daily - CTA -> negative for PE  New onset Afib-RVR: converted to sinus rhythm after started on amiodarone and digoxin. Remains mildly tachycardic.  - cardiology consulted - continue amiodarone - continue heparin gtt - continue PO metoprolol at increased dose so adding midodrine for BP support.   Mild AKI- Cr normal at baseline. Suspect at least partially from shock and levophed use. Improving Cr 1.36>1.22 - BMP am - encourage PO hydration  Anemia- hgb 9.7>8.5>8.2. partially dilutional. On heparin gtt so will watch closely - transfusion threshold <7   Chronic systolic CHF: 2D echo on 05/30/2019 showed EF of 40-45%.  EF down to 20-25% this admission. Patient does not have leg edema or JVD.  CHF seem to be compensated. No significant pulmonary edema on imaging -BNP is elevated at 2941 - s/p lasix - f/u echo   Hyperlipidemia associated with type 2 diabetes mellitus (HCC) -Zocor   Diabetes mellitus without complication (HCC): Most recent A1c 11.7, poorly controlled.  Patient taking metformin and glipizide at home -Sliding scale insulin - added long-acting as glucose was elevated in setting of acute steroid use   Essential hypertension- holding home meds as she is currently borderline hypotensive. Was on levophed and with 500cc bolus yesterday but was weaned off yesterday once HR better controlled.     Depression -continue home Celexa   Hypokalemia and hypophosphatemia: Potassium 3.4, phosphorus 2.3, magnesium 2.0 -Repleted   Overweight: Body weight  72.3 kg, BMI 29.17 -Encourage losing weight -Exercise and healthy diet  Body mass index is 28.31 kg/m.  VTE ppx: heparin gtt  Diet:     Diet   Diet Carb Modified Fluid consistency: Thin; Room service appropriate? Yes   Consultants: Cardiology    Subjective 12/27/23    Pt reports no shortness of breath or chest pain. No palpitations. She  would prefer boosts instead of ensures. No other complaints.    Objective   Vitals:   12/27/23 0400 12/27/23 0500 12/27/23 0600 12/27/23 0700  BP: 111/66 (!) 100/52 94/70 107/64  Pulse: (!) 112 (!) 103 95 (!) 106  Resp: (!) 27 (!) 22 (!) 21 20  Temp: 98.1 F (36.7 C)     TempSrc: Oral     SpO2: 95% 95% 96% 94%  Weight:  70.2 kg    Height:        Intake/Output Summary (Last 24 hours) at 12/27/2023 0811 Last data filed at 12/27/2023 0600 Gross per 24 hour  Intake 711.01 ml  Output 550 ml  Net 161.01 ml   Filed Weights   12/25/23 1550 12/26/23 0500 12/27/23 0500  Weight: 72 kg 70.8 kg 70.2 kg    Physical Exam:  General: alert and resting comfortably in bed. Just completed TTE Respiratory: normal respiratory effort. No wheezing or rales Cardiovascular: quick capillary refill, normal S1/S2, rapid heart rate, normal rhythm, no JVD, murmurs Nervous: A&O x3. no gross focal neurologic deficits, normal speech Extremities: moves all equally, no edema, normal tone Skin: dry, intact, normal temperature, normal color. No rashes, lesions or ulcers on exposed skin  Labs   I have personally reviewed the following labs and imaging studies CBC    Component Value Date/Time   WBC 14.9 (H) 12/27/2023 0543   RBC 3.38 (L) 12/27/2023 0543   HGB 8.2 (L) 12/27/2023 0543   HGB 12.9 11/26/2013 1445   HCT 25.8 (L) 12/27/2023 0543   HCT 39.5 11/26/2013 1445   PLT 346 12/27/2023 0543   PLT 273 11/26/2013 1445   MCV 76.3 (L) 12/27/2023 0543   MCV 87.6 11/26/2013 1445   MCH 24.3 (L) 12/27/2023 0543   MCHC 31.8 12/27/2023 0543   RDW 18.5 (H) 12/27/2023 0543   RDW 13.3 11/26/2013 1445   LYMPHSABS 0.6 (L) 12/23/2023 1836   LYMPHSABS 2.0 11/26/2013 1445   MONOABS 0.7 12/23/2023 1836   MONOABS 0.7 11/26/2013 1445   EOSABS 0.1 12/23/2023 1836   EOSABS 0.3 11/26/2013 1445   BASOSABS 0.0 12/23/2023 1836   BASOSABS 0.1 11/26/2013 1445      Latest Ref Rng & Units 12/27/2023    5:43 AM 12/26/2023     1:38 AM 12/25/2023    6:00 AM  BMP  Glucose 70 - 99 mg/dL 161  096  045   BUN 8 - 23 mg/dL 32  26  18   Creatinine 0.44 - 1.00 mg/dL 4.09  8.11  9.14   Sodium 135 - 145 mmol/L 130  135  134   Potassium 3.5 - 5.1 mmol/L 4.1  3.6  3.4   Chloride 98 - 111 mmol/L 96  100  98   CO2 22 - 32 mmol/L 24  24  19    Calcium 8.9 - 10.3 mg/dL 8.7  8.2  8.6     ECHOCARDIOGRAM COMPLETE Result Date: 12/26/2023    ECHOCARDIOGRAM REPORT   Patient Name:   Laura Bell Date of Exam: 12/26/2023 Medical Rec #:  [id number]  Height:       62.0 in Accession #:    [id number]         Weight:       156.1 lb Date of Birth:  10/10/1935          BSA:          1.721 m Patient Age:    87 years           BP:           101/61 mmHg Patient Gender: F                  HR:           110 bpm. Exam Location:  [facility] Procedure: 2D Echo, 3D Echo, Cardiac Doppler, Color Doppler and Strain Analysis            (Both Spectral and Color Flow Doppler were utilized during            procedure). Indications:     Atrial Fibrillation  History:         Patient has prior history of Echocardiogram examinations, most                  recent 05/31/2019. Cardiomyopathy and CHF, Arrythmias:Atrial                  Fibrillation, Signs/Symptoms:Fatigue and Shortness of Breath;                  Risk Factors:Hypertension, Diabetes and Dyslipidemia. Influenza                  +.  Sonographer:     Mikki Harbor Referring Phys:  [id number] SHERI HAMMOCK Diagnosing Phys: Debbe Odea MD  Sonographer Comments: Global longitudinal strain was attempted. IMPRESSIONS  1. Left ventricular ejection fraction, by estimation, is 20 to 25%. Left ventricular ejection fraction by PLAX is 24 %. The left ventricle has severely decreased function. The left ventricle demonstrates global hypokinesis. Left ventricular diastolic parameters are consistent with Grade II diastolic dysfunction (pseudonormalization).  2. Right ventricular systolic function is normal. The right  ventricular size is normal. There is normal pulmonary artery systolic pressure.  3. The mitral valve is normal in structure. Mild mitral valve regurgitation.  4. The aortic valve is tricuspid. Aortic valve regurgitation is mild.  5. The inferior vena cava is normal in size with greater than 50% respiratory variability, suggesting right atrial pressure of 3 mmHg. FINDINGS  Left Ventricle: Left ventricular ejection fraction, by estimation, is 20 to 25%. Left ventricular ejection fraction by PLAX is 24 %. The left ventricle has severely decreased function. The left ventricle demonstrates global hypokinesis. Global longitudinal strain performed but not reported based on interpreter judgement due to suboptimal tracking. The left ventricular internal cavity size was normal in size. There is no left ventricular hypertrophy. Left ventricular diastolic parameters are consistent with Grade II diastolic dysfunction (pseudonormalization). Right Ventricle: The right ventricular size is normal. No increase in right ventricular wall thickness. Right ventricular systolic function is normal. There is normal pulmonary artery systolic pressure. The tricuspid regurgitant velocity is 2.50 m/s, and  with an assumed right atrial pressure of 3 mmHg, the estimated right ventricular systolic pressure is 28.0 mmHg. Left Atrium: Left atrial size was normal in size. Right Atrium: Right atrial size was normal in size. Pericardium: There is no evidence of pericardial effusion. Mitral Valve: The mitral valve is normal in structure. Mild mitral valve regurgitation. MV peak  gradient, 3.6 mmHg. The mean mitral valve gradient is 2.0 mmHg. Tricuspid Valve: The tricuspid valve is normal in structure. Tricuspid valve regurgitation is mild. Aortic Valve: The aortic valve is tricuspid. Aortic valve regurgitation is mild. Aortic valve mean gradient measures 4.0 mmHg. Aortic valve peak gradient measures 6.7 mmHg. Aortic valve area, by VTI measures 1.90 cm.  Pulmonic Valve: The pulmonic valve was normal in structure. Pulmonic valve regurgitation is not visualized. Aorta: The aortic root is normal in size and structure. Venous: The inferior vena cava is normal in size with greater than 50% respiratory variability, suggesting right atrial pressure of 3 mmHg. IAS/Shunts: No atrial level shunt detected by color flow Doppler. Additional Comments: 3D was performed not requiring image post processing on an independent workstation and was indeterminate.  LEFT VENTRICLE PLAX 2D LV EF:         Left            Diastology                ventricular     LV e' medial:    6.96 cm/s                ejection        LV E/e' medial:  14.7                fraction by     LV e' lateral:   7.18 cm/s                PLAX is 24      LV E/e' lateral: 14.2                %. LVIDd:         4.50 cm LVIDs:         4.00 cm LV PW:         1.10 cm LV IVS:        1.00 cm LVOT diam:     2.10 cm LV SV:         43 LV SV Index:   25 LVOT Area:     3.46 cm  LV Volumes (MOD) LV vol d, MOD    69.8 ml A2C: LV vol d, MOD    73.0 ml A4C: LV vol s, MOD    49.3 ml A2C: LV vol s, MOD    52.5 ml A4C: LV SV MOD A2C:   20.5 ml LV SV MOD A4C:   73.0 ml LV SV MOD BP:    20.3 ml RIGHT VENTRICLE RV Basal diam:  3.30 cm RV Mid diam:    3.30 cm RV S prime:     12.40 cm/s TAPSE (M-mode): 2.0 cm LEFT ATRIUM             Index        RIGHT ATRIUM           Index LA diam:        3.70 cm 2.15 cm/m   RA Area:     17.10 cm LA Vol (A2C):   47.6 ml 27.67 ml/m  RA Volume:   47.90 ml  27.84 ml/m LA Vol (A4C):   38.5 ml 22.38 ml/m LA Biplane Vol: 43.1 ml 25.05 ml/m  AORTIC VALVE                    PULMONIC VALVE AV Area (Vmax):    1.87 cm     PV Vmax:  0.81 m/s AV Area (Vmean):   1.71 cm     PV Peak grad:  2.6 mmHg AV Area (VTI):     1.90 cm AV Vmax:           129.00 cm/s AV Vmean:          91.500 cm/s AV VTI:            0.224 m AV Peak Grad:      6.7 mmHg AV Mean Grad:      4.0 mmHg LVOT Vmax:         69.50 cm/s LVOT Vmean:         45.200 cm/s LVOT VTI:          0.123 m LVOT/AV VTI ratio: 0.55  AORTA Ao Root diam: 3.00 cm Ao Asc diam:  3.50 cm MITRAL VALVE                TRICUSPID VALVE MV Area (PHT): 6.60 cm     TR Peak grad:   25.0 mmHg MV Area VTI:   1.97 cm     TR Vmax:        250.00 cm/s MV Peak grad:  3.6 mmHg MV Mean grad:  2.0 mmHg     SHUNTS MV Vmax:       0.95 m/s     Systemic VTI:  0.12 m MV Vmean:      59.7 cm/s    Systemic Diam: 2.10 cm MV Decel Time: 115 msec MV E velocity: 102.00 cm/s MV A velocity: 78.00 cm/s MV E/A ratio:  1.31 Debbe Odea MD Electronically signed by Debbe Odea MD Signature Date/Time: 12/26/2023/12:49:04 PM    Final      Disposition Plan & Communication  Patient status: Inpatient  Admitted From: Home Planned disposition location: Skilled nursing facility Anticipated discharge date: TBD pending clinical improvement  Family Communication: voicemail left for daughter  Author: Leeroy Bock, DO [facility] 12/27/2023, 8:11 AM   Available by Epic secure chat 7AM-7PM. If 7PM-7AM, please contact night-coverage.  [facility] contact information found on [email or web address].

## 2023-12-27 NOTE — Progress Notes (Signed)
 Pt transferred to room 231 at this time. Report given to [facility]. VSS prior to transfer. Daughter notified of transfer.
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
## 2023-12-28 ENCOUNTER — Inpatient Hospital Stay

## 2023-12-28 DIAGNOSIS — I429 Cardiomyopathy, unspecified: Secondary | ICD-10-CM | POA: Diagnosis not present

## 2023-12-28 DIAGNOSIS — I480 Paroxysmal atrial fibrillation: Secondary | ICD-10-CM | POA: Diagnosis not present

## 2023-12-28 DIAGNOSIS — I5023 Acute on chronic systolic (congestive) heart failure: Secondary | ICD-10-CM | POA: Diagnosis not present

## 2023-12-28 DIAGNOSIS — J210 Acute bronchiolitis due to respiratory syncytial virus: Secondary | ICD-10-CM | POA: Diagnosis not present

## 2023-12-28 DIAGNOSIS — I4891 Unspecified atrial fibrillation: Secondary | ICD-10-CM | POA: Diagnosis not present

## 2023-12-28 DIAGNOSIS — J101 Influenza due to other identified influenza virus with other respiratory manifestations: Secondary | ICD-10-CM | POA: Diagnosis not present

## 2023-12-28 DIAGNOSIS — B338 Other specified viral diseases: Secondary | ICD-10-CM | POA: Diagnosis not present

## 2023-12-28 LAB — CULTURE, BLOOD (ROUTINE X 2)
Culture: NO GROWTH
Culture: NO GROWTH
Special Requests: ADEQUATE

## 2023-12-28 LAB — BASIC METABOLIC PANEL WITH GFR
Anion gap: 10 (ref 5–15)
BUN: 27 mg/dL — ABNORMAL HIGH (ref 8–23)
CO2: 25 mmol/L (ref 22–32)
Calcium: 8.8 mg/dL — ABNORMAL LOW (ref 8.9–10.3)
Chloride: 98 mmol/L (ref 98–111)
Creatinine, Ser: 1.16 mg/dL — ABNORMAL HIGH (ref 0.44–1.00)
GFR, Estimated: 46 mL/min — ABNORMAL LOW (ref >60)
Glucose, Bld: 207 mg/dL — ABNORMAL HIGH (ref 70–99)
Potassium: 3.9 mmol/L (ref 3.5–5.1)
Sodium: 133 mmol/L — ABNORMAL LOW (ref 135–145)

## 2023-12-28 LAB — CBC
HCT: 26.5 % — ABNORMAL LOW (ref 36.0–46.0)
Hemoglobin: 8.4 g/dL — ABNORMAL LOW (ref 12.0–15.0)
MCH: 23.9 pg — ABNORMAL LOW (ref 26.0–34.0)
MCHC: 31.7 g/dL (ref 30.0–36.0)
MCV: 75.5 fL — ABNORMAL LOW (ref 80.0–100.0)
Platelets: 346 10*3/uL (ref 150–400)
RBC: 3.51 MIL/uL — ABNORMAL LOW (ref 3.87–5.11)
RDW: 18.6 % — ABNORMAL HIGH (ref 11.5–15.5)
WBC: 13.9 10*3/uL — ABNORMAL HIGH (ref 4.0–10.5)
nRBC: 0.4 % — ABNORMAL HIGH (ref 0.0–0.2)

## 2023-12-28 LAB — GLUCOSE, CAPILLARY
Glucose-Capillary: 255 mg/dL — ABNORMAL HIGH (ref 70–99)
Glucose-Capillary: 227 mg/dL — ABNORMAL HIGH (ref 70–99)
Glucose-Capillary: 298 mg/dL — ABNORMAL HIGH (ref 70–99)

## 2023-12-28 LAB — HEPARIN LEVEL (UNFRACTIONATED): Heparin Unfractionated: 0.34 [IU]/mL (ref 0.30–0.70)

## 2023-12-28 MED ORDER — CEPHALEXIN 500 MG PO CAPS
500.0000 mg | ORAL_CAPSULE | Freq: Three times a day (TID) | ORAL | Status: DC
  Administered 2023-12-28 – 2024-01-01 (×13): 500 mg via ORAL
  Filled 2023-12-28 (×13): qty 1

## 2023-12-28 MED ORDER — DICLOFENAC SODIUM 1 % EX GEL
2.0000 g | Freq: Four times a day (QID) | CUTANEOUS | Status: AC
  Administered 2023-12-28 – 2023-12-29 (×3): 2 g via TOPICAL
  Filled 2023-12-28: qty 100

## 2023-12-28 MED ORDER — APIXABAN 5 MG PO TABS
5.0000 mg | ORAL_TABLET | Freq: Two times a day (BID) | ORAL | Status: DC
  Administered 2023-12-28 – 2024-01-01 (×8): 5 mg via ORAL
  Filled 2023-12-28 (×8): qty 1
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
## 2023-12-28 NOTE — Plan of Care (Signed)
  Problem: Clinical Measurements: Goal: Ability to maintain clinical measurements within normal limits will improve Outcome: Progressing   Problem: Clinical Measurements: Goal: Respiratory complications will improve Outcome: Progressing   Problem: Activity: Goal: Risk for activity intolerance will decrease Outcome: Progressing   Problem: Nutrition: Goal: Adequate nutrition will be maintained Outcome: Progressing   Problem: Pain Managment: Goal: General experience of comfort will improve and/or be controlled Outcome: Progressing   Problem: Safety: Goal: Ability to remain free from injury will improve Outcome: Progressing

## 2023-12-28 NOTE — Progress Notes (Signed)
 Rounding Note    Patient Name: Laura Bell Date of Encounter: 12/28/2023  [facility] Cardiologist: Julien Nordmann, MD   Subjective   Feels that breathing continues to improve. No chest pain.  Inpatient Medications    Scheduled Meds:  amiodarone  200 mg Oral BID   apixaban  5 mg Oral BID   aspirin EC  81 mg Oral Daily   cephALEXin  500 mg Oral Q8H   citalopram  10 mg Oral QPM   diclofenac Sodium  2 g Topical QID   famotidine  20 mg Oral Daily   feeding supplement  1 Container Oral BID BM   insulin aspart  0-9 Units Subcutaneous TID WC   insulin glargine-yfgn  15 Units Subcutaneous Daily   ipratropium-albuterol  3 mL Nebulization TID   melatonin  5 mg Oral QHS   metoprolol succinate  50 mg Oral Daily   midodrine  5 mg Oral TID WC   simvastatin  20 mg Oral q1800   Continuous Infusions:  heparin 1,000 Units/hr (12/28/23 1521)   PRN Meds: acetaminophen, dextromethorphan-guaiFENesin, levalbuterol, LORazepam, metoprolol tartrate, ondansetron (ZOFRAN) IV, mouth rinse   Vital Signs    Vitals:   12/28/23 0500 12/28/23 0830 12/28/23 1207 12/28/23 1404  BP:   124/81   Pulse:   (!) 114   Resp:   17   Temp:   98.5 F (36.9 C)   TempSrc:   Oral   SpO2:  95% 95% 99%  Weight: 70.5 kg     Height:        Intake/Output Summary (Last 24 hours) at 12/28/2023 1604 Last data filed at 12/28/2023 0900 Gross per 24 hour  Intake 438.98 ml  Output --  Net 438.98 ml      12/28/2023    5:00 AM 12/27/2023    5:00 AM 12/26/2023    5:00 AM  Last 3 Weights  Weight (lbs) 155 lb 6.8 oz 154 lb 12.2 oz 156 lb 1.4 oz  Weight (kg) 70.5 kg 70.2 kg 70.8 kg      Telemetry    SR - Personally Reviewed  Physical Exam   GEN: Well nourished, well developed in no acute distress NECK: No JVD CARDIAC: regular rhythm, normal S1 and S2, no rubs or gallops. No murmur. VASCULAR: Radial pulses 2+ bilaterally.  RESPIRATORY:  Less coarse than yesterday but with some rhonchi, no  rales ABDOMEN: Soft, non-tender, non-distended MUSCULOSKELETAL:  Moves all 4 limbs independently SKIN: Warm and dry, no edema. R antecubital lesion from IV NEUROLOGIC:  No focal neuro deficits noted. PSYCHIATRIC:  Normal affect    New pertinent results (labs, ECG, imaging, cardiac studies)     Assessment & Plan    Acute hypoxic respiratory failure Influenza A RSV -per primary team, now on room air   Afib RVR on presentation, now in sinus rhythm -likely driving by acute respiratory illness as above -transition from heparin to DOAC. No plans for cath as an inpatient -continue oral amiodarone -consolidated to metoprolol succinate   Acute on chronic systolic and diastolic heart failure -EF down to 20-25% in the setting of acute illness -hypotension, acute kidney injury limit GDMT. Currently requiring midodrine. BP starting to rise. If midodrine can be stopped and there is still BP room, would consider trial of low dose ARB -Cr gradually improving -consolidated to metoprolol succinate as above -appears euvolemic -given acute illness, would allow her clinical status to stabilize, try to optimize meds. Would then repeat echo, consider  ischemic evaluation as an outpatient if EF remains reduced. Discussed with patient and family, they agree    Signed, Jodelle Red, MD  12/28/2023, 4:04 PM

## 2023-12-28 NOTE — Plan of Care (Signed)
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
## 2023-12-28 NOTE — Progress Notes (Signed)
 Physical Therapy Treatment Patient Details Name: Laura Bell MRN: [id number] DOB: 04-07-1936 Today's Date: 12/28/2023   History of Present Illness Pt is an 88 year old female who presents with SOB, work up included Influenza A, RSV with acute hypoxic respiratory failure, afib with RVR, AKI, hypokalemia and hypophosphatemia. PMH significant for HTN, HLD, sCHF with EF 40-45%, depression, anemia, breast cancer, and uterus cancer.    PT Comments  Pt was pleasant and motivated to participate during the session and put forth good effort throughout. Pt required a significant amount of time and effort along with cuing and the use of the bed rail to go from sup to sitting at the EOB.  Pt was able to perform multiple sit to stands and was able to stand 1-2 min with no adverse symptoms and with HR increasing to the low 120s.  Pt able to take several shuffling steps at the EOB and then to the chair with min A to guide the RW and with cues for general sequencing with HR mostly in the mid 120s with a max of 130, nsg notified.  No adverse symptoms noted by the pt during the session with SpO2 WNL on room air.  Pt will benefit from continued PT services upon discharge to safely address deficits listed in patient problem list for decreased caregiver assistance and eventual return to PLOF.      If plan is discharge home, recommend the following: A lot of help with walking and/or transfers;A lot of help with bathing/dressing/bathroom;Supervision due to cognitive status;Help with stairs or ramp for entrance;Assistance with cooking/housework   Can travel by private vehicle     No  Equipment Recommendations  None recommended by PT    Recommendations for Other Services       Precautions / Restrictions Precautions Precautions: Fall Restrictions Weight Bearing Restrictions Per Provider Order: No     Mobility  Bed Mobility Overal bed mobility: Needs Assistance Bed Mobility: Supine to Sit     Supine  to sit: Supervision     General bed mobility comments: Extra time, effort, and use of the bed rail along with cuing for sequencing required    Transfers Overall transfer level: Needs assistance Equipment used: Rolling walker (2 wheels) Transfers: Sit to/from Stand Sit to Stand: Min assist           General transfer comment: Min A and cues for increased trunk flexion requird to come to complete standing position    Ambulation/Gait Ambulation/Gait assistance: Min assist Gait Distance (Feet): 3 Feet Assistive device: Rolling walker (2 wheels) Gait Pattern/deviations: Step-through pattern, Decreased step length - right, Decreased step length - left, Shuffle, Trunk flexed Gait velocity: decreased     General Gait Details: Pt able to take several small, effortful, shuffling steps near the EOB and from bed to recliner with min A to guide the RW and with cues for general sequencing   Stairs             Wheelchair Mobility     Tilt Bed    Modified Rankin (Stroke Patients Only)       Balance Overall balance assessment: Needs assistance Sitting-balance support: Feet supported Sitting balance-Leahy Scale: Good     Standing balance support: Bilateral upper extremity supported, During functional activity, Reliant on assistive device for balance Standing balance-Leahy Scale: Fair  Communication Communication Communication: No apparent difficulties  Cognition Arousal: Alert Behavior During Therapy: WFL for tasks assessed/performed                             Following commands: Impaired Following commands impaired: Follows one step commands with increased time    Cueing Cueing Techniques: Verbal cues, Tactile cues, Visual cues  Exercises Total Joint Exercises Long Arc Quad: AROM, Strengthening, Both, 10 reps Knee Flexion: AROM, Strengthening, Both, 10 reps Other Exercises Other Exercises: Static standing at  EOB 2 x 1-2 min for improved activity tolerance    General Comments        Pertinent Vitals/Pain Pain Assessment Pain Assessment: No/denies pain    Home Living                          Prior Function            PT Goals (current goals can now be found in the care plan section) Progress towards PT goals: Progressing toward goals    Frequency    Min 2X/week      PT Plan      Co-evaluation              AM-PAC PT "6 Clicks" Mobility   Outcome Measure  Help needed turning from your back to your side while in a flat bed without using bedrails?: A Little Help needed moving from lying on your back to sitting on the side of a flat bed without using bedrails?: A Little Help needed moving to and from a bed to a chair (including a wheelchair)?: A Little Help needed standing up from a chair using your arms (e.g., wheelchair or bedside chair)?: A Little Help needed to walk in hospital room?: A Lot Help needed climbing 3-5 steps with a railing? : Total 6 Click Score: 15    End of Session Equipment Utilized During Treatment: Gait belt Activity Tolerance: Patient tolerated treatment well Patient left: in chair;with chair alarm set;with call bell/phone within reach;with family/visitor present Nurse Communication: Mobility status;Other (comment) (HR response to activity per above) PT Visit Diagnosis: Muscle weakness (generalized) (M62.81);Unsteadiness on feet (R26.81)     Time: 1610-9604 PT Time Calculation (min) (ACUTE ONLY): 26 min  Charges:    $Therapeutic Activity: 23-37 mins PT General Charges $$ ACUTE PT VISIT: 1 Visit                     D. Elly Modena PT, DPT 12/28/23, 4:57 PM

## 2023-12-28 NOTE — Progress Notes (Signed)
 PROGRESS NOTE Laura Bell    DOB: 25-Feb-1936, 88 y.o.  WUJ:[id number]    Code Status: Full Code   DOA: 12/23/2023   LOS: 4   Brief hospital course  Laura Bell is a 88 y.o. female with medical history significant of HTN, HLD, sCHF with EF 40-45%, depression, anemia, breast cancer, uterus cancer, who presents with SOB.   ED Course: positive PCR for flu A and RSV, WBC 7.8, potassium 3.4, mild AKI with creatinine 0.95, BUN 10 and GFR 59 (baseline creatinine 0.58 on 08/02/2020), lactic acid 2.8, temperature normal, blood pressure 121/72, heart rate 128, RR 18, oxygen saturation 92% on room air, which improved to 99% on 2 L oxygen.  Chest x-ray negative. Positive for RSV and influenza.    NFA:OZHYQ rhythm, QTc 469, LAE, low voltage, nonspecific T wave change.  Respiratory status 3/26 difficult and requiring bipap but has stabilized and now doing well on 2L. Treated sinus tach with IV metoprolol with good results. 3/27: afib RVR refractory to metoprolol or diltizam gtt. Treated with amiodarone gtt and digoxin. Weaned to room air.  3/29: transitioned to PO amiodarone and metoprolol. Continues in mild sinus tachycardia. Denies chest pain or palpitations.  Presently- patient is hemodynamically stable and cardiology modifying meds to optimize afib control. Remains on heparin gtt. May have cath tomorrow.  Appears to have phlebitis on RUE.   Assessment & Plan  Principal Problem:   Influenza A Active Problems:   RSV (respiratory syncytial virus pneumonia)   Elevated lactic acid level   Acute on chronic systolic heart failure (HCC)   Hyperlipidemia associated with type 2 diabetes mellitus (HCC)   Diabetes mellitus without complication (HCC)   Essential hypertension   AKI (acute kidney injury) (HCC)   Depression   Hypokalemia   Hypophosphatemia   Overweight (BMI 25.0-29.9)   Hypoxia   RSV (acute bronchiolitis due to respiratory syncytial virus)   SOB (shortness of breath)   Atrial  fibrillation with RVR (HCC)   Sepsis (HCC)  Acute hypoxic respiratory failure- resolved.  Influenza A and RSV infection:  O2 sats are stable on room air now after short time on bipap.  -Bronchodilators and prn Mucinex -Incentive spirometry -Tamiflu 30 mg twice daily - CTA -> negative for PE  New onset Afib-RVR: converted to sinus rhythm after started on amiodarone and digoxin. Remains mildly tachycardic.  - cardiology consulted - continue amiodarone - continue heparin gtt - continue PO metoprolol at increased dose so adding midodrine for BP support.   Mild AKI- Cr normal at baseline. Suspect at least partially from shock and levophed use. Improving Cr 1.36>1.22>1.16 - encourage PO hydration  Thrombophlebitis- RUE at site of previous IV. Examined today. Mildly tender. [place] negative for fluid collection for drainage. Appears to be cellulitis - keflex - heating pad, voltaren gel, tylenol - mild compressive dressing - on heparin gtt  Anemia- hgb is roughly stable now. 9.7>8.5>8.2>8.4. partially dilutional. On heparin gtt so will watch closely - transfusion threshold <7   Chronic systolic CHF: 2D echo on 05/30/2019 showed EF of 40-45%.  EF down to 20-25% this admission. Patient does not have leg edema or JVD.  CHF seem to be compensated. No significant pulmonary edema on imaging -BNP is elevated at 2941 - s/p lasix - cardiology following, possible cath this week   Hyperlipidemia associated with type 2 diabetes mellitus (HCC) -Zocor   Diabetes mellitus without complication (HCC): Most recent A1c 11.7, poorly controlled.  Patient taking metformin and glipizide at  home -Sliding scale insulin - added long-acting as glucose was elevated in setting of acute steroid use   Essential hypertension- holding home meds as she is currently borderline hypotensive. Was on levophed and with 500cc bolus briefly but was weaned off.    Depression -continue home Celexa   Hypokalemia and  hypophosphatemia: Potassium 3.4, phosphorus 2.3, magnesium 2.0 -Repleted   Overweight: Body weight 72.3 kg, BMI 29.17 -Encourage losing weight -Exercise and healthy diet  Body mass index is 28.43 kg/m.  VTE ppx: heparin gtt  Diet:     Diet   Diet Carb Modified Fluid consistency: Thin; Room service appropriate? Yes   Consultants: Cardiology    Subjective 12/28/23    Pt reports no shortness of breath or chest pain. No palpitations. She would prefer boosts instead of ensures. No other complaints.    Objective   Vitals:   12/27/23 2110 12/27/23 2307 12/28/23 0313 12/28/23 0500  BP:  109/60 111/60   Pulse:  96 (!) 103   Resp:  18 16   Temp:  98.6 F (37 C) 98.1 F (36.7 C)   TempSrc:  Oral    SpO2: 96% 95% 96%   Weight:    70.5 kg  Height:        Intake/Output Summary (Last 24 hours) at 12/28/2023 0731 Last data filed at 12/28/2023 0443 Gross per 24 hour  Intake 424.5 ml  Output 300 ml  Net 124.5 ml   Filed Weights   12/26/23 0500 12/27/23 0500 12/28/23 0500  Weight: 70.8 kg 70.2 kg 70.5 kg    Physical Exam:  General: alert and resting comfortably in bed. Just completed TTE Respiratory: normal respiratory effort. No wheezing or rales Cardiovascular: quick capillary refill, normal S1/S2, rapid heart rate, normal rhythm, no JVD, murmurs Nervous: A&O x3. no gross focal neurologic deficits, normal speech Extremities: moves all equally, no edema, normal tone. R UE at site of previous IV with erythema, tenderness to palpation and 2cm mass which appears to have pruritic head.  Skin: dry, intact, normal temperature, normal color. No rashes, lesions or ulcers on exposed skin  Labs   I have personally reviewed the following labs and imaging studies CBC    Component Value Date/Time   WBC 13.9 (H) 12/28/2023 0445   RBC 3.51 (L) 12/28/2023 0445   HGB 8.4 (L) 12/28/2023 0445   HGB 12.9 11/26/2013 1445   HCT 26.5 (L) 12/28/2023 0445   HCT 39.5 11/26/2013 1445   PLT 346  12/28/2023 0445   PLT 273 11/26/2013 1445   MCV 75.5 (L) 12/28/2023 0445   MCV 87.6 11/26/2013 1445   MCH 23.9 (L) 12/28/2023 0445   MCHC 31.7 12/28/2023 0445   RDW 18.6 (H) 12/28/2023 0445   RDW 13.3 11/26/2013 1445   LYMPHSABS 0.6 (L) 12/23/2023 1836   LYMPHSABS 2.0 11/26/2013 1445   MONOABS 0.7 12/23/2023 1836   MONOABS 0.7 11/26/2013 1445   EOSABS 0.1 12/23/2023 1836   EOSABS 0.3 11/26/2013 1445   BASOSABS 0.0 12/23/2023 1836   BASOSABS 0.1 11/26/2013 1445      Latest Ref Rng & Units 12/28/2023    4:45 AM 12/27/2023    5:43 AM 12/26/2023    1:38 AM  BMP  Glucose 70 - 99 mg/dL 578  469  629   BUN 8 - 23 mg/dL 27  32  26   Creatinine 0.44 - 1.00 mg/dL 5.28  4.13  2.44   Sodium 135 - 145 mmol/L 133  130  135  Potassium 3.5 - 5.1 mmol/L 3.9  4.1  3.6   Chloride 98 - 111 mmol/L 98  96  100   CO2 22 - 32 mmol/L 25  24  24    Calcium 8.9 - 10.3 mg/dL 8.8  8.7  8.2     ECHOCARDIOGRAM COMPLETE Result Date: 12/26/2023    ECHOCARDIOGRAM REPORT   Patient Name:   Laura Bell Date of Exam: 12/26/2023 Medical Rec #:  [id number]          Height:       62.0 in Accession #:    [id number]         Weight:       156.1 lb Date of Birth:  03-18-1936          BSA:          1.721 m Patient Age:    87 years           BP:           101/61 mmHg Patient Gender: F                  HR:           110 bpm. Exam Location:  [facility] Procedure: 2D Echo, 3D Echo, Cardiac Doppler, Color Doppler and Strain Analysis            (Both Spectral and Color Flow Doppler were utilized during            procedure). Indications:     Atrial Fibrillation  History:         Patient has prior history of Echocardiogram examinations, most                  recent 05/31/2019. Cardiomyopathy and CHF, Arrythmias:Atrial                  Fibrillation, Signs/Symptoms:Fatigue and Shortness of Breath;                  Risk Factors:Hypertension, Diabetes and Dyslipidemia. Influenza                  +.  Sonographer:     Mikki Harbor Referring  Phys:  [id number] SHERI HAMMOCK Diagnosing Phys: Debbe Odea MD  Sonographer Comments: Global longitudinal strain was attempted. IMPRESSIONS  1. Left ventricular ejection fraction, by estimation, is 20 to 25%. Left ventricular ejection fraction by PLAX is 24 %. The left ventricle has severely decreased function. The left ventricle demonstrates global hypokinesis. Left ventricular diastolic parameters are consistent with Grade II diastolic dysfunction (pseudonormalization).  2. Right ventricular systolic function is normal. The right ventricular size is normal. There is normal pulmonary artery systolic pressure.  3. The mitral valve is normal in structure. Mild mitral valve regurgitation.  4. The aortic valve is tricuspid. Aortic valve regurgitation is mild.  5. The inferior vena cava is normal in size with greater than 50% respiratory variability, suggesting right atrial pressure of 3 mmHg. FINDINGS  Left Ventricle: Left ventricular ejection fraction, by estimation, is 20 to 25%. Left ventricular ejection fraction by PLAX is 24 %. The left ventricle has severely decreased function. The left ventricle demonstrates global hypokinesis. Global longitudinal strain performed but not reported based on interpreter judgement due to suboptimal tracking. The left ventricular internal cavity size was normal in size. There is no left ventricular hypertrophy. Left ventricular diastolic parameters are consistent with Grade II diastolic dysfunction (pseudonormalization). Right Ventricle: The right ventricular size is normal. No  increase in right ventricular wall thickness. Right ventricular systolic function is normal. There is normal pulmonary artery systolic pressure. The tricuspid regurgitant velocity is 2.50 m/s, and  with an assumed right atrial pressure of 3 mmHg, the estimated right ventricular systolic pressure is 28.0 mmHg. Left Atrium: Left atrial size was normal in size. Right Atrium: Right atrial size was normal in  size. Pericardium: There is no evidence of pericardial effusion. Mitral Valve: The mitral valve is normal in structure. Mild mitral valve regurgitation. MV peak gradient, 3.6 mmHg. The mean mitral valve gradient is 2.0 mmHg. Tricuspid Valve: The tricuspid valve is normal in structure. Tricuspid valve regurgitation is mild. Aortic Valve: The aortic valve is tricuspid. Aortic valve regurgitation is mild. Aortic valve mean gradient measures 4.0 mmHg. Aortic valve peak gradient measures 6.7 mmHg. Aortic valve area, by VTI measures 1.90 cm. Pulmonic Valve: The pulmonic valve was normal in structure. Pulmonic valve regurgitation is not visualized. Aorta: The aortic root is normal in size and structure. Venous: The inferior vena cava is normal in size with greater than 50% respiratory variability, suggesting right atrial pressure of 3 mmHg. IAS/Shunts: No atrial level shunt detected by color flow Doppler. Additional Comments: 3D was performed not requiring image post processing on an independent workstation and was indeterminate.  LEFT VENTRICLE PLAX 2D LV EF:         Left            Diastology                ventricular     LV e' medial:    6.96 cm/s                ejection        LV E/e' medial:  14.7                fraction by     LV e' lateral:   7.18 cm/s                PLAX is 24      LV E/e' lateral: 14.2                %. LVIDd:         4.50 cm LVIDs:         4.00 cm LV PW:         1.10 cm LV IVS:        1.00 cm LVOT diam:     2.10 cm LV SV:         43 LV SV Index:   25 LVOT Area:     3.46 cm  LV Volumes (MOD) LV vol d, MOD    69.8 ml A2C: LV vol d, MOD    73.0 ml A4C: LV vol s, MOD    49.3 ml A2C: LV vol s, MOD    52.5 ml A4C: LV SV MOD A2C:   20.5 ml LV SV MOD A4C:   73.0 ml LV SV MOD BP:    20.3 ml RIGHT VENTRICLE RV Basal diam:  3.30 cm RV Mid diam:    3.30 cm RV S prime:     12.40 cm/s TAPSE (M-mode): 2.0 cm LEFT ATRIUM             Index        RIGHT ATRIUM           Index LA diam:        3.70 cm 2.15 cm/m  RA Area:     17.10 cm LA Vol (A2C):   47.6 ml 27.67 ml/m  RA Volume:   47.90 ml  27.84 ml/m LA Vol (A4C):   38.5 ml 22.38 ml/m LA Biplane Vol: 43.1 ml 25.05 ml/m  AORTIC VALVE                    PULMONIC VALVE AV Area (Vmax):    1.87 cm     PV Vmax:       0.81 m/s AV Area (Vmean):   1.71 cm     PV Peak grad:  2.6 mmHg AV Area (VTI):     1.90 cm AV Vmax:           129.00 cm/s AV Vmean:          91.500 cm/s AV VTI:            0.224 m AV Peak Grad:      6.7 mmHg AV Mean Grad:      4.0 mmHg LVOT Vmax:         69.50 cm/s LVOT Vmean:        45.200 cm/s LVOT VTI:          0.123 m LVOT/AV VTI ratio: 0.55  AORTA Ao Root diam: 3.00 cm Ao Asc diam:  3.50 cm MITRAL VALVE                TRICUSPID VALVE MV Area (PHT): 6.60 cm     TR Peak grad:   25.0 mmHg MV Area VTI:   1.97 cm     TR Vmax:        250.00 cm/s MV Peak grad:  3.6 mmHg MV Mean grad:  2.0 mmHg     SHUNTS MV Vmax:       0.95 m/s     Systemic VTI:  0.12 m MV Vmean:      59.7 cm/s    Systemic Diam: 2.10 cm MV Decel Time: 115 msec MV E velocity: 102.00 cm/s MV A velocity: 78.00 cm/s MV E/A ratio:  1.31 Debbe Odea MD Electronically signed by Debbe Odea MD Signature Date/Time: 12/26/2023/12:49:04 PM    Final      Disposition Plan & Communication  Patient status: Inpatient  Admitted From: Home Planned disposition location: Skilled nursing facility Anticipated discharge date: TBD pending clinical improvement  Family Communication: daughter at bedside  Author: Leeroy Bock, DO [facility] 12/28/2023, 7:31 AM   Available by Epic secure chat 7AM-7PM. If 7PM-7AM, please contact night-coverage.  [facility] contact information found on [email or web address].

## 2023-12-29 ENCOUNTER — Telehealth (HOSPITAL_COMMUNITY): Payer: Self-pay | Admitting: Pharmacy Technician

## 2023-12-29 ENCOUNTER — Other Ambulatory Visit (HOSPITAL_COMMUNITY): Payer: Self-pay

## 2023-12-29 DIAGNOSIS — I5023 Acute on chronic systolic (congestive) heart failure: Secondary | ICD-10-CM | POA: Diagnosis not present

## 2023-12-29 DIAGNOSIS — L02419 Cutaneous abscess of limb, unspecified: Secondary | ICD-10-CM

## 2023-12-29 DIAGNOSIS — L02413 Cutaneous abscess of right upper limb: Secondary | ICD-10-CM | POA: Diagnosis not present

## 2023-12-29 DIAGNOSIS — J101 Influenza due to other identified influenza virus with other respiratory manifestations: Secondary | ICD-10-CM | POA: Diagnosis not present

## 2023-12-29 DIAGNOSIS — I480 Paroxysmal atrial fibrillation: Secondary | ICD-10-CM | POA: Diagnosis not present

## 2023-12-29 DIAGNOSIS — I4891 Unspecified atrial fibrillation: Secondary | ICD-10-CM | POA: Diagnosis not present

## 2023-12-29 DIAGNOSIS — B338 Other specified viral diseases: Secondary | ICD-10-CM | POA: Diagnosis not present

## 2023-12-29 LAB — CBC
HCT: 27 % — ABNORMAL LOW (ref 36.0–46.0)
Hemoglobin: 8.7 g/dL — ABNORMAL LOW (ref 12.0–15.0)
MCH: 24.4 pg — ABNORMAL LOW (ref 26.0–34.0)
MCHC: 32.2 g/dL (ref 30.0–36.0)
MCV: 75.8 fL — ABNORMAL LOW (ref 80.0–100.0)
Platelets: 322 10*3/uL (ref 150–400)
RBC: 3.56 MIL/uL — ABNORMAL LOW (ref 3.87–5.11)
RDW: 18.6 % — ABNORMAL HIGH (ref 11.5–15.5)
WBC: 13.5 10*3/uL — ABNORMAL HIGH (ref 4.0–10.5)
nRBC: 0.3 % — ABNORMAL HIGH (ref 0.0–0.2)

## 2023-12-29 LAB — GLUCOSE, CAPILLARY
Glucose-Capillary: 189 mg/dL — ABNORMAL HIGH (ref 70–99)
Glucose-Capillary: 199 mg/dL — ABNORMAL HIGH (ref 70–99)
Glucose-Capillary: 274 mg/dL — ABNORMAL HIGH (ref 70–99)

## 2023-12-29 MED ORDER — IPRATROPIUM-ALBUTEROL 0.5-2.5 (3) MG/3ML IN SOLN
3.0000 mL | Freq: Two times a day (BID) | RESPIRATORY_TRACT | Status: DC
  Administered 2023-12-30 – 2023-12-31 (×3): 3 mL via RESPIRATORY_TRACT
  Filled 2023-12-29 (×3): qty 3

## 2023-12-29 MED ORDER — METOPROLOL SUCCINATE ER 100 MG PO TB24
100.0000 mg | ORAL_TABLET | Freq: Every day | ORAL | Status: DC
  Administered 2023-12-29 – 2024-01-01 (×4): 100 mg via ORAL
  Filled 2023-12-29 (×4): qty 1

## 2023-12-29 MED ORDER — LIDOCAINE-EPINEPHRINE 1 %-1:100000 IJ SOLN
20.0000 mL | Freq: Once | INTRAMUSCULAR | Status: DC
  Filled 2023-12-29: qty 20
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
## 2023-12-29 NOTE — Care Management Important Message (Signed)
 Important Message  Patient Details  Name: Laura Bell MRN: [id number] Date of Birth: 01-08-1936   Important Message Given:  Yes - Medicare IM     Cristela Blue, CMA 12/29/2023, 11:32 AM

## 2023-12-29 NOTE — Telephone Encounter (Signed)

## 2023-12-29 NOTE — Progress Notes (Addendum)
 Physical Therapy Treatment Patient Details Name: Laura Bell MRN: [id number] DOB: [birth year] Today's Date: 12/29/2023   History of Present Illness Pt is an 88 year old female who presents with SOB, work up included Influenza A, RSV with acute hypoxic respiratory failure, afib with RVR, AKI, hypokalemia and hypophosphatemia. PMH significant for HTN, HLD, sCHF with EF 40-45%, depression, anemia, breast cancer, and uterus cancer.    PT Comments  OOB with inc time and bed features. Steady in sitting.  She stands to RW with min a x 1 and sidesteps along bed then continues to chair.  Fatigued with minimal steps.  She does stand x 3 more times for marching in place and static standing but unable to progress gait further at times time.  Remained in chair with safety on and needs in reach.  Cues for hand placements.  Pt will need +1 assist upon return to SNF as she is not at her baseline.   If plan is discharge home, recommend the following: A little help with walking and/or transfers;A little help with bathing/dressing/bathroom;Assist for transportation;Help with stairs or ramp for entrance;Assistance with cooking/housework   Can travel by private vehicle        Equipment Recommendations  None recommended by PT    Recommendations for Other Services       Precautions / Restrictions Precautions Precautions: Fall Restrictions Weight Bearing Restrictions Per Provider Order: No     Mobility  Bed Mobility Overal bed mobility: Needs Assistance Bed Mobility: Supine to Sit     Supine to sit: Supervision       Patient Response: Cooperative  Transfers Overall transfer level: Needs assistance Equipment used: Rolling walker (2 wheels)   Sit to Stand: Min assist                Ambulation/Gait Ambulation/Gait assistance: Min assist Gait Distance (Feet): 4 Feet Assistive device: Rolling walker (2 wheels) Gait Pattern/deviations: Step-through pattern, Decreased step length -  right, Decreased step length - left, Shuffle, Trunk flexed Gait velocity: decreased     General Gait Details: limited to sidesteps along bed then continued to chair.  quickly fatigued and not able to porgress gait further but did stand x 3 additional times for marches and static standing   Stairs             Wheelchair Mobility     Tilt Bed Tilt Bed Patient Response: Cooperative  Modified Rankin (Stroke Patients Only)       Balance Overall balance assessment: Needs assistance Sitting-balance support: Feet supported Sitting balance-Leahy Scale: Good     Standing balance support: Bilateral upper extremity supported, During functional activity, Reliant on assistive device for balance Standing balance-Leahy Scale: Fair                              Hotel manager: No apparent difficulties  Cognition Arousal: Alert Behavior During Therapy: WFL for tasks assessed/performed   PT - Cognitive impairments: No family/caregiver present to determine baseline                         Following commands: Impaired Following commands impaired: Follows one step commands with increased time, Follows one step commands inconsistently    Cueing Cueing Techniques: Verbal cues, Tactile cues, Visual cues  Exercises      General Comments        Pertinent Vitals/Pain Pain Assessment Pain Assessment: No/denies  pain    Home Living                          Prior Function            PT Goals (current goals can now be found in the care plan section) Progress towards PT goals: Progressing toward goals    Frequency    Min 2X/week      PT Plan      Co-evaluation              AM-PAC PT "6 Clicks" Mobility   Outcome Measure  Help needed turning from your back to your side while in a flat bed without using bedrails?: A Little Help needed moving from lying on your back to sitting on the side of a flat bed  without using bedrails?: A Little Help needed moving to and from a bed to a chair (including a wheelchair)?: A Little Help needed standing up from a chair using your arms (e.g., wheelchair or bedside chair)?: A Little Help needed to walk in hospital room?: A Little Help needed climbing 3-5 steps with a railing? : Total 6 Click Score: 16    End of Session Equipment Utilized During Treatment: Gait belt Activity Tolerance: Patient tolerated treatment well Patient left: in chair;with chair alarm set;with call bell/phone within reach Nurse Communication: Mobility status;Other (comment) PT Visit Diagnosis: Muscle weakness (generalized) (M62.81);Unsteadiness on feet (R26.81)     Time: 0943-1000 PT Time Calculation (min) (ACUTE ONLY): 17 min  Charges:    $Gait Training: 8-22 mins PT General Charges $$ ACUTE PT VISIT: 1 Visit                   Danielle Dess, PTA 12/29/23, 10:14 AM

## 2023-12-29 NOTE — Progress Notes (Signed)
 Progress Note  Patient Name: Laura Bell Date of Encounter: 12/29/2023  Primary Cardiologist: Mariah Milling  Subjective   Maintaining sinus rhythm.  Denies chest pain, dyspnea, palpitations, dizziness, presyncope, or syncope.  No hematochezia, melena, hemoptysis, hematemesis, or hematuria.  She indicates that she has not previously been told she was anemic.   Inpatient Medications    Scheduled Meds:  amiodarone  200 mg Oral BID   apixaban  5 mg Oral BID   cephALEXin  500 mg Oral Q8H   citalopram  10 mg Oral QPM   diclofenac Sodium  2 g Topical QID   famotidine  20 mg Oral Daily   feeding supplement  1 Container Oral BID BM   insulin aspart  0-9 Units Subcutaneous TID WC   insulin glargine-yfgn  15 Units Subcutaneous Daily   ipratropium-albuterol  3 mL Nebulization TID   lidocaine-EPINEPHrine  20 mL Intradermal Once   melatonin  5 mg Oral QHS   metoprolol succinate  100 mg Oral Daily   simvastatin  20 mg Oral q1800   Continuous Infusions:  PRN Meds: acetaminophen, dextromethorphan-guaiFENesin, levalbuterol, LORazepam, metoprolol tartrate, ondansetron (ZOFRAN) IV, mouth rinse   Vital Signs    Vitals:   12/28/23 2342 12/29/23 0303 12/29/23 0500 12/29/23 0736  BP: 123/84 118/69  118/72  Pulse:  100  (!) 105  Resp: 18 17    Temp: 98.4 F (36.9 C) 98.4 F (36.9 C)  98.4 F (36.9 C)  TempSrc: Oral Oral    SpO2: 97% 98%  95%  Weight:   70.9 kg   Height:        Intake/Output Summary (Last 24 hours) at 12/29/2023 1045 Last data filed at 12/28/2023 1756 Gross per 24 hour  Intake --  Output 500 ml  Net -500 ml   Filed Weights   12/27/23 0500 12/28/23 0500 12/29/23 0500  Weight: 70.2 kg 70.5 kg 70.9 kg    Telemetry    Sinus tachycardia with PACs and rare isolated PVCs - Personally Reviewed  ECG    No new tracings - Personally Reviewed  Physical Exam   GEN: No acute distress.   Neck: No JVD. Cardiac: RRR, no murmurs, rubs, or gallops.  Respiratory: Clear  to auscultation bilaterally.  GI: Soft, nontender, non-distended.   MS: No edema; No deformity. Neuro:  Alert and oriented x 3; Nonfocal.  Psych: Normal affect.  Labs    Chemistry Recent Labs  Lab 12/23/23 1836 12/24/23 0421 12/26/23 0138 12/27/23 0543 12/28/23 0445  NA 130*   < > 135 130* 133*  K 3.4*   < > 3.6 4.1 3.9  CL 95*   < > 100 96* 98  CO2 21*   < > 24 24 25   GLUCOSE 288*   < > 292* 313* 207*  BUN 10   < > 26* 32* 27*  CREATININE 0.95   < > 1.36* 1.22* 1.16*  CALCIUM 8.6*   < > 8.2* 8.7* 8.8*  PROT 6.2*  --   --   --   --   ALBUMIN 2.7*  --   --   --   --   AST 21  --   --   --   --   ALT 11  --   --   --   --   ALKPHOS 62  --   --   --   --   BILITOT 0.5  --   --   --   --  GFRNONAA 59*   < > 38* 43* 46*  ANIONGAP 14   < > 11 10 10    < > = values in this interval not displayed.     Hematology Recent Labs  Lab 12/27/23 0543 12/28/23 0445 12/29/23 0505  WBC 14.9* 13.9* 13.5*  RBC 3.38* 3.51* 3.56*  HGB 8.2* 8.4* 8.7*  HCT 25.8* 26.5* 27.0*  MCV 76.3* 75.5* 75.8*  MCH 24.3* 23.9* 24.4*  MCHC 31.8 31.7 32.2  RDW 18.5* 18.6* 18.6*  PLT 346 346 322    Cardiac EnzymesNo results for input(s): "TROPONINI" in the last 168 hours. No results for input(s): "TROPIPOC" in the last 168 hours.   BNP Recent Labs  Lab 12/23/23 2254  BNP 2,941.3*     DDimer No results for input(s): "DDIMER" in the last 168 hours.   Radiology    [place] RT UPPER EXTREM LTD SOFT TISSUE NON VASCULAR Result Date: 12/28/2023 IMPRESSION: 1. Ill-defined subcutaneous collection in the right antecubital fossa may reflect a phlegmon or infiltrated fluid. No organized fluid collection identified. 2. Superficial thrombophlebitis of the adjacent cephalic vein. Electronically Signed   By: Carey Bullocks M.D.   On: 12/28/2023 15:02    Cardiac Studies   2D echo 12/26/2023: 1. Left ventricular ejection fraction, by estimation, is 20 to 25%. Left  ventricular ejection fraction by PLAX is 24  %. The left ventricle has  severely decreased function. The left ventricle demonstrates global  hypokinesis. Left ventricular diastolic  parameters are consistent with Grade II diastolic dysfunction  (pseudonormalization).   2. Right ventricular systolic function is normal. The right ventricular  size is normal. There is normal pulmonary artery systolic pressure.   3. The mitral valve is normal in structure. Mild mitral valve  regurgitation.   4. The aortic valve is tricuspid. Aortic valve regurgitation is mild.   5. The inferior vena cava is normal in size with greater than 50%  respiratory variability, suggesting right atrial pressure of 3 mmHg.  __________  2D echo 05/31/2019: 1. The left ventricle has mild-moderately reduced systolic function, with  an ejection fraction of 40-45%. The cavity size was normal. Left  ventricular diastolic Doppler parameters are consistent with impaired  relaxation. Left ventricular diffuse  hypokinesis.   2. The right ventricle has normal systolic function. The cavity was  normal. There is no increase in right ventricular wall thickness.Unable to  estimate RVSP   Patient Profile     88 y.o. female with history of HFrEF, DM2, HTN, HLD, recurrent falls, breast/cervical cancer, and GERD who is admitted with acute hypoxic respiratory failure secondary to influenza A and RSV and being seen today for newly diagnosed A-fib.  Assessment & Plan    1.  Newly diagnosed A-fib with RVR: -Likely in the setting of acute respiratory illness -Presented in sinus rhythm -Started on the morning of 3/27 -Currently in sinus rhythm with sinus tachycardia likely driven by acute respiratory illness and underlying anemia -Continue Toprol-XL 100 mg for now -On amiodarone 200 mg bid, continue for 7 days total followed by 200 mg daily thereafter (4/4) -CHA2DS2-VASc at least 7 (CHF, HTN, age x 2, DM, vascular disease, sex category) -Has been transitioned from heparin drip to  apixaban 5 mg twice daily (does not meet reduced dosing criteria) -TSH normal -Mild hypokalemia repleted -Magnesium at goal -With high risk medication use, will need outpatient monitoring (LFT and TSH normal prior to initiating amiodarone)  2.  Acute on chronic HFrEF: -Appears euvolemic -Prior EF of 40  to 45% in 2020, now with EF 20-25%, possibly in the setting of acute illness with A-fib with RVR -Currently on Toprol-XL 100 mg (PTA 75 mg) -Escalation of GDMT has been limited by AKI and hypotension previously requiring midodrine -Blood pressure now improved and normal, discontinue midodrine -If renal function and blood pressure remains stable anticipate adding low-dose ARB on 4/1 -Would ultimately benefit from ischemic evaluation, though not currently a cath candidate given progressive anemia -Recommend repeating limited echo in the outpatient setting in sinus rhythm on maximally tolerated GDMT to trend cardiomyopathy -Not requiring standing diuretic at this time -CHF education  3.  Microcytic anemia: -Denies symptoms of bleeding -Hemoglobin downtrending from 9.7-8.7 -Historically has ran around 11 or 12 with isolated readings in in the 9 range in 2020 -Ongoing management and evaluation per primary service -Maintain hemoglobin greater than 8  4.  AKI: -Improving -Avoid nephrotoxic agents -Trend on 4/1  4.  Hypotension with history of hypertension: -Resolved -Discontinue midodrine -Remains on Toprol-XL as above      For questions or updates, please contact [facility] Please consult [email or web address] for contact info under Cardiology/STEMI.    Signed, Eula Listen, PA-C [facility] Pager: [phone] 12/29/2023, 10:45 AM

## 2023-12-29 NOTE — [place] FL2 (Signed)
 [place] MEDICAID FL2 LEVEL OF CARE FORM     IDENTIFICATION  Patient Name: Laura Bell Birthdate: 1936/02/12 Sex: female Admission Date (Current Location): 12/23/2023  [place] and [place] Number:  Chiropodist and Address:  [facility], [address] [place], [place] [address]      Provider Number: [id number]  Attending Physician Name and Address:  Leeroy Bock, MD  Relative Name and Phone Number:  Tawny Hopping (Daughter)  [phone] (Mobile)    Current Level of Care: Hospital Recommended Level of Care: Skilled Nursing Facility Prior Approval Number:    Date Approved/Denied:   PASRR Number: [id number]  Discharge Plan: SNF    Current Diagnoses: Patient Active Problem List   Diagnosis Date Noted   Abscess of antecubital fossa 12/29/2023   Hypoxia 12/25/2023   RSV (acute bronchiolitis due to respiratory syncytial virus) 12/25/2023   SOB (shortness of breath) 12/25/2023   Atrial fibrillation with RVR (HCC) 12/25/2023   Sepsis (HCC) 12/25/2023   Influenza A 12/23/2023   RSV (respiratory syncytial virus pneumonia) 12/23/2023   Diabetes mellitus without complication (HCC) 12/23/2023   Acute on chronic systolic heart failure (HCC) 12/23/2023   Depression 12/23/2023   Elevated lactic acid level 12/23/2023   AKI (acute kidney injury) (HCC) 12/23/2023   Hypokalemia 12/23/2023   Overweight (BMI 25.0-29.9) 12/23/2023   Hypophosphatemia 12/23/2023   Change in stool 06/12/2020   Screening-pulmonary TB 05/29/2020   Vaginitis 05/18/2020   Generalized weakness 02/21/2020   Anemia 12/23/2019   Cardiomyopathy (HCC) 10/23/2019   History of UTI 10/22/2019   Requires assistance with activities of daily living (ADL) 10/22/2019   Pubic ramus fracture (HCC) 09/04/2019   Hematoma of right thigh 09/04/2019   Chronic vulvitis 08/31/2019   Fall 06/08/2019   Sinus tachycardia 02/18/2019   Hip injury, right, initial encounter 02/09/2019    Right foot pain 02/09/2019   Frequent falls 02/09/2019   Right leg injury, initial encounter 11/03/2018   Cystocele, midline 07/14/2018   Vaginal atrophy 07/14/2018   B12 deficiency 04/03/2018   Poor balance 04/01/2018   Anxiety 04/01/2018   Hip arthritis 09/03/2017   Arthritis of knee, degenerative 09/03/2017   Groin pain, right 09/02/2017   Knee pain, right 09/02/2017   Adverse effects of medication 01/29/2017   Fatigue 01/29/2017   Routine general medical examination at a health care facility 04/22/2016   Estrogen deficiency 04/22/2016   Dysuria 10/18/2015   Vaginal itching 11/15/2014   History of cervical cancer 03/08/2014   Urinary frequency 02/28/2014   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   Malignant neoplasm of lower-outer quadrant of left breast of female, estrogen receptor positive (HCC) 11/26/2013   Encounter for Medicare annual wellness exam 08/31/2013   Stress reaction 09/01/2012   Other screening mammogram 07/29/2012   Post-menopausal 07/29/2012   Neck pain on left side 12/03/2011   Overweight 10/31/2011   Arthropathy of hand 09/07/2008   Allergic rhinitis 04/18/2008   BACK PAIN, LUMBAR 11/24/2007   Personal history presenting hazards to health 10/09/2007   Hyperlipidemia associated with type 2 diabetes mellitus (HCC) 01/27/2007   VARICOSE VEINS, LOWER EXTREMITIES 01/27/2007   Diabetes type 2, uncontrolled 01/06/2007   Essential hypertension 01/06/2007   GERD 01/06/2007   Osteoarthritis 01/06/2007   Osteopenia 01/06/2007   Urinary incontinence 01/06/2007    Orientation RESPIRATION BLADDER Height & Weight     Self, Time, Situation  Normal External catheter, Incontinent Weight: 70.9 kg Height:  5\' 2"  (157.5 cm)  BEHAVIORAL SYMPTOMS/MOOD NEUROLOGICAL BOWEL NUTRITION STATUS  Other (Comment) (n/a)  (n/a) Continent Diet (Carb modified)  AMBULATORY STATUS COMMUNICATION OF NEEDS Skin   Limited Assist Verbally PU Stage and Appropriate Care,  Other (Comment) (ecchymosis bialteral buttocks and hips)     PU Stage 3 Dressing: Daily                 Personal Care Assistance Level of Assistance  Bathing, Dressing Bathing Assistance: Limited assistance   Dressing Assistance: Limited assistance     Functional Limitations Info  Sight Sight Info: Impaired        SPECIAL CARE FACTORS FREQUENCY  PT (By licensed PT), OT (By licensed OT)     PT Frequency: Min 2x weekly OT Frequency: Min 2x weekly            Contractures Contractures Info: Not present    Additional Factors Info  Allergies, Code Status Code Status Info: FULL Allergies Info: Actos (Pioglitazone), Alendronate Sodium, Boniva (Ibandronate Sodium), Ibandronate, Lansoprazole, Omeprazole           Current Medications (12/29/2023):  This is the current hospital active medication list Current Facility-Administered Medications  Medication Dose Route Frequency Provider Last Rate Last Admin   acetaminophen (TYLENOL) tablet 650 mg  650 mg Oral Q6H PRN Lorretta Harp, MD       amiodarone (PACERONE) tablet 200 mg  200 mg Oral BID Lorine Bears A, MD   200 mg at 12/29/23 0914   apixaban (ELIQUIS) tablet 5 mg  5 mg Oral BID Barrie Folk, RPH   5 mg at 12/29/23 1308   cephALEXin (KEFLEX) capsule 500 mg  500 mg Oral Q8H Leeroy Bock, MD   500 mg at 12/29/23 1306   citalopram (CELEXA) tablet 10 mg  10 mg Oral QPM Lorretta Harp, MD   10 mg at 12/28/23 1745   dextromethorphan-guaiFENesin (MUCINEX DM) 30-600 MG per 12 hr tablet 1 tablet  1 tablet Oral BID PRN Lorretta Harp, MD       diclofenac Sodium (VOLTAREN) 1 % topical gel 2 g  2 g Topical QID Leeroy Bock, MD   2 g at 12/29/23 0916   famotidine (PEPCID) tablet 20 mg  20 mg Oral Daily Lorretta Harp, MD   20 mg at 12/29/23 6578   feeding supplement (BOOST / RESOURCE BREEZE) liquid 1 Container  1 Container Oral BID BM Leeroy Bock, MD   1 Container at 12/29/23 1307   insulin aspart (novoLOG) injection  0-9 Units  0-9 Units Subcutaneous TID WC Lorretta Harp, MD   2 Units at 12/29/23 1306   insulin glargine-yfgn (SEMGLEE) injection 15 Units  15 Units Subcutaneous Daily Leeroy Bock, MD   15 Units at 12/29/23 1306   ipratropium-albuterol (DUONEB) 0.5-2.5 (3) MG/3ML nebulizer solution 3 mL  3 mL Nebulization TID Leeroy Bock, MD   3 mL at 12/29/23 1343   levalbuterol (XOPENEX) nebulizer solution 1.25 mg  1.25 mg Nebulization Q4H PRN Antonieta Iba, MD       lidocaine-EPINEPHrine (XYLOCAINE W/EPI) 1 %-1:100000 (with pres) injection 20 mL  20 mL Intradermal Once Donovan Kail, PA-C       LORazepam (ATIVAN) injection 0.5 mg  0.5 mg Intravenous Q6H PRN Lorretta Harp, MD   0.5 mg at 12/25/23 0007   melatonin tablet 5 mg  5 mg Oral QHS Leeroy Bock, MD   5 mg at 12/28/23 2227   metoprolol succinate (TOPROL-XL) 24 hr tablet 100 mg  100 mg Oral Daily Jamelle Rushing L, MD   100 mg at 12/29/23 0914   metoprolol tartrate (LOPRESSOR) injection 5 mg  5 mg Intravenous Q2H PRN Lorretta Harp, MD   5 mg at 12/25/23 2320   ondansetron [facility]) injection 4 mg  4 mg Intravenous Q8H PRN Lorretta Harp, MD   4 mg at 12/25/23 1641   Oral care mouth rinse  15 mL Mouth Rinse PRN Leeroy Bock, MD       simvastatin (ZOCOR) tablet 20 mg  20 mg Oral q1800 Lorretta Harp, MD   20 mg at 12/28/23 1745     Discharge Medications: Please see discharge summary for a list of discharge medications.  Relevant Imaging Results:  Relevant Lab Results:   Additional Information SSN: [id number]  Truddie Hidden, RN

## 2023-12-29 NOTE — Progress Notes (Signed)
 Occupational Therapy Treatment Patient Details Name: Laura Bell MRN: [id number] DOB: January 12, 1936 Today's Date: 12/29/2023   History of present illness Pt is an 88 year old female who presents with SOB, work up included Influenza A, RSV with acute hypoxic respiratory failure, afib with RVR, AKI, hypokalemia and hypophosphatemia. PMH significant for HTN, HLD, sCHF with EF 40-45%, depression, anemia, breast cancer, and uterus cancer.   OT comments  Pt seen today for OT tx, pt agreeable to session focusing on functional transfers and seated ADLs. Oral care and grooming tasks with SETUP in recliner, transfers from recliner > bed with MIN A and RW use. Pt able to take a few lateral sidesteps towards HOB, but fatigues quickly, returning to seated. MAX A for BLE and trunk mgmt back to bed due to weakness and fatigue. Pt left in bed, needs in reach, and alarm activated. OT will continue to progress as able. Discharge recommendation remains appropriate, patient will benefit from continued inpatient follow up therapy, <3 hours/day       If plan is discharge home, recommend the following:  A lot of help with walking and/or transfers;A lot of help with bathing/dressing/bathroom;Assistance with cooking/housework;Help with stairs or ramp for entrance;Supervision due to cognitive status   Equipment Recommendations  Other (comment)       Precautions / Restrictions Precautions Precautions: Fall Precaution/Restrictions Comments: watch HR Restrictions Weight Bearing Restrictions Per Provider Order: No       Mobility Bed Mobility Overal bed mobility: Needs Assistance Bed Mobility: Sit to Supine     Supine to sit: Max assist     General bed mobility comments: increased time to transition back to supine, heavy assist for BLE and trunk    Transfers Overall transfer level: Needs assistance Equipment used: Rolling walker (2 wheels) Transfers: Bed to chair/wheelchair/BSC Sit to Stand: Min  assist     Step pivot transfers: Min assist     General transfer comment: minA, cues for upright posture     Balance Overall balance assessment: Needs assistance Sitting-balance support: Feet supported Sitting balance-Leahy Scale: Good     Standing balance support: Bilateral upper extremity supported, During functional activity, Reliant on assistive device for balance Standing balance-Leahy Scale: Fair Standing balance comment: decreased standing tolerance                           ADL either performed or assessed with clinical judgement   ADL Overall ADL's : Needs assistance/impaired     Grooming: Wash/dry hands;Wash/dry face;Oral care;Set up;Sitting Grooming Details (indicate cue type and reason): recliner level, setup for task performance                             Functional mobility during ADLs: Minimal assistance;Rolling walker (2 wheels);Cueing for safety;Cueing for sequencing       Communication Communication Communication: No apparent difficulties   Cognition Arousal: Alert Behavior During Therapy: WFL for tasks assessed/performed Cognition: Cognition impaired   Orientation impairments: Place, Time, Situation Awareness: Intellectual awareness impaired, Online awareness impaired Memory impairment (select all impairments): Short-term memory, Working [employer] fast streamer, Non-declarative long-term memory, Geneticist, molecular long-term memory Attention impairment (select first level of impairment): Sustained attention Executive functioning impairment (select all impairments): Initiation, Organization, Sequencing, Reasoning, Problem solving                   Following commands: Impaired Following commands impaired: Follows one step commands with increased time,  Follows one step commands inconsistently      Cueing   Cueing Techniques: Verbal cues, Tactile cues, Visual cues        General Comments HR 99 start of session, increasing to 115 with  transfer, back down to 109 end of session    Pertinent Vitals/ Pain       Pain Assessment Pain Assessment: No/denies pain         Frequency  Min 2X/week        Progress Toward Goals  OT Goals(current goals can now be found in the care plan section)  Progress towards OT goals: Progressing toward goals  Acute Rehab OT Goals OT Goal Formulation: With patient Time For Goal Achievement: 01/09/24 Potential to Achieve Goals: Good ADL Goals Pt Will Perform Grooming: with supervision;sitting Pt Will Perform Lower Body Dressing: with supervision;sitting/lateral leans;sit to/from stand Pt Will Transfer to Toilet: with supervision;ambulating Pt Will Perform Toileting - Clothing Manipulation and hygiene: with supervision;sit to/from stand;sitting/lateral leans  Plan         AM-PAC OT "6 Clicks" Daily Activity     Outcome Measure   Help from another person eating meals?: A Little Help from another person taking care of personal grooming?: A Little Help from another person toileting, which includes using toliet, bedpan, or urinal?: A Lot Help from another person bathing (including washing, rinsing, drying)?: A Lot Help from another person to put on and taking off regular upper body clothing?: A Little Help from another person to put on and taking off regular lower body clothing?: A Lot 6 Click Score: 15    End of Session Equipment Utilized During Treatment: Gait belt;Rolling walker (2 wheels)  OT Visit Diagnosis: Other abnormalities of gait and mobility (R26.89);Muscle weakness (generalized) (M62.81)   Activity Tolerance Patient tolerated treatment well;Patient limited by fatigue   Patient Left in bed;with call bell/phone within reach;with bed alarm set   Nurse Communication Mobility status        Time: 1115-1130 OT Time Calculation (min): 15 min  Charges: OT General Charges $OT Visit: 1 Visit OT Treatments $Self Care/Home Management : 8-22 mins  Emali Heyward L.  Zidan Helget, OTR/L  12/29/23, 1:16 PM

## 2023-12-29 NOTE — Progress Notes (Signed)
 PROGRESS NOTE Laura Bell    DOB: 1936/06/18, 88 y.o.  OFB:[id number]    Code Status: Full Code   DOA: 12/23/2023   LOS: 5   Brief hospital course  Laura Bell is a 88 y.o. female with medical history significant of HTN, HLD, sCHF with EF 40-45%, depression, anemia, breast cancer, uterus cancer, who presents with SOB.   ED Course: positive PCR for flu A and RSV, WBC 7.8, potassium 3.4, mild AKI with creatinine 0.95, BUN 10 and GFR 59 (baseline creatinine 0.58 on 08/02/2020), lactic acid 2.8, temperature normal, blood pressure 121/72, heart rate 128, RR 18, oxygen saturation 92% on room air, which improved to 99% on 2 L oxygen.  Chest x-ray negative. Positive for RSV and influenza.    POE:UMPNT rhythm, QTc 469, LAE, low voltage, nonspecific T wave change.  Respiratory status 3/26 difficult and requiring bipap but has stabilized and now doing well on 2L. Treated sinus tach with IV metoprolol with good results. 3/27: afib RVR refractory to metoprolol or diltizam gtt. Treated with amiodarone gtt and digoxin. Weaned to room air.  3/29: transitioned to PO amiodarone and metoprolol. Continues in mild sinus tachycardia. Denies chest pain or palpitations.  Presently- patient is hemodynamically stable and cardiology modifying meds to optimize afib control. No longer planning inpatient cath. Heparin transitioned to eliquis. Appears to have phlebitis on RUE.  General surgery consulted to I&D abscess on RUE. Culture is sent.   Assessment & Plan  Principal Problem:   Influenza A Active Problems:   RSV (respiratory syncytial virus pneumonia)   Elevated lactic acid level   Acute on chronic systolic heart failure (HCC)   Hyperlipidemia associated with type 2 diabetes mellitus (HCC)   Diabetes mellitus without complication (HCC)   Essential hypertension   AKI (acute kidney injury) (HCC)   Depression   Hypokalemia   Hypophosphatemia   Overweight (BMI 25.0-29.9)   Hypoxia   RSV (acute  bronchiolitis due to respiratory syncytial virus)   SOB (shortness of breath)   Atrial fibrillation with RVR (HCC)   Sepsis (HCC)  Acute hypoxic respiratory failure- resolved.  Influenza A and RSV infection:  O2 sats are stable on room air now after short time on bipap.  -Bronchodilators and prn Mucinex -Incentive spirometry -Tamiflu 30 mg twice daily - CTA -> negative for PE  New onset Afib-RVR: converted to sinus rhythm after started on amiodarone and digoxin. Remains mildly tachycardic.  - cardiology consulted - continue amiodarone - heparin gtt converted to eliquis - increased metoprolol since she is having tachycardia with exertion still up to 140.  - midodrine was stopped by cardiology and seems to be tolerating well. Will watch BP closely on increased metoprolol dose  Mild AKI- Cr normal at baseline. Suspect at least partially from shock and levophed use. Improving Cr 1.36>1.22>1.16 - encourage PO hydration  Thrombophlebitis- RUE at site of previous IV. Examined today. Improved erythema. Non-tender. US showing phlebitis.  - general surgery consulted for I&D and sent for culture - continue keflex - heating pad, voltaren gel, tylenol - mild compressive dressing - on eliquis  Anemia- hgb is roughly stable now. 9.7>>>8.7 partially dilutional. watch closely - transfusion threshold <7   Chronic systolic CHF: 2D echo on 05/30/2019 showed EF of 40-45%.  EF down to 20-25% this admission. Patient does not have leg edema or JVD.  CHF seem to be compensated. No significant pulmonary edema on imaging -BNP is elevated at 2941 - s/p lasix - cardiology following  Hyperlipidemia associated with type 2 diabetes mellitus (HCC) -Zocor   Diabetes mellitus without complication (HCC): Most recent A1c 11.7, poorly controlled.  Patient taking metformin and glipizide at home -Sliding scale insulin   Essential hypertension- holding home meds as she is currently borderline hypotensive. Was on  levophed and with 500cc bolus briefly but was weaned off.    Depression -continue home Celexa   Hypokalemia and hypophosphatemia: Potassium 3.4, phosphorus 2.3, magnesium 2.0 -Repleted   Overweight: Body weight 72.3 kg, BMI 29.17 -Encourage losing weight -Exercise and healthy diet  Body mass index is 28.59 kg/m.  VTE ppx: heparin gtt apixaban (ELIQUIS) tablet 5 mg  Diet:     Diet   Diet Carb Modified Fluid consistency: Thin; Room service appropriate? Yes   Consultants: Cardiology    Subjective 12/29/23    Pt reports feeling well. Had no palpitations or chest pain or SOB with ambulation. Pain in R arm is improved.    Objective   Vitals:   12/28/23 2139 12/28/23 2342 12/29/23 0303 12/29/23 0500  BP:  123/84 118/69   Pulse:   100   Resp:  18 17   Temp:  98.4 F (36.9 C) 98.4 F (36.9 C)   TempSrc:  Oral Oral   SpO2: 96% 97% 98%   Weight:    70.9 kg  Height:        Intake/Output Summary (Last 24 hours) at 12/29/2023 0732 Last data filed at 12/28/2023 1756 Gross per 24 hour  Intake 240 ml  Output 500 ml  Net -260 ml   Filed Weights   12/27/23 0500 12/28/23 0500 12/29/23 0500  Weight: 70.2 kg 70.5 kg 70.9 kg    Physical Exam:  General: alert and resting comfortably in bed. Just completed TTE Respiratory: normal respiratory effort. No wheezing or rales Cardiovascular: quick capillary refill, normal S1/S2, rapid heart rate, normal rhythm, no JVD, murmurs Nervous: A&O x3. no gross focal neurologic deficits, normal speech Extremities: moves all equally, no edema, normal tone. R UE at site of previous IV with erythema, no tenderness to palpation and 2cm mass which appears to have pruritic head. Skin: dry, intact, normal temperature, normal color. No rashes, lesions or ulcers on exposed skin  Labs   I have personally reviewed the following labs and imaging studies CBC    Component Value Date/Time   WBC 13.5 (H) 12/29/2023 0505   RBC 3.56 (L) 12/29/2023 0505    HGB 8.7 (L) 12/29/2023 0505   HGB 12.9 11/26/2013 1445   HCT 27.0 (L) 12/29/2023 0505   HCT 39.5 11/26/2013 1445   PLT 322 12/29/2023 0505   PLT 273 11/26/2013 1445   MCV 75.8 (L) 12/29/2023 0505   MCV 87.6 11/26/2013 1445   MCH 24.4 (L) 12/29/2023 0505   MCHC 32.2 12/29/2023 0505   RDW 18.6 (H) 12/29/2023 0505   RDW 13.3 11/26/2013 1445   LYMPHSABS 0.6 (L) 12/23/2023 1836   LYMPHSABS 2.0 11/26/2013 1445   MONOABS 0.7 12/23/2023 1836   MONOABS 0.7 11/26/2013 1445   EOSABS 0.1 12/23/2023 1836   EOSABS 0.3 11/26/2013 1445   BASOSABS 0.0 12/23/2023 1836   BASOSABS 0.1 11/26/2013 1445      Latest Ref Rng & Units 12/28/2023    4:45 AM 12/27/2023    5:43 AM 12/26/2023    1:38 AM  BMP  Glucose 70 - 99 mg/dL 784  696  295   BUN 8 - 23 mg/dL 27  32  26   Creatinine 0.44 -  1.00 mg/dL 4.09  8.11  9.14   Sodium 135 - 145 mmol/L 133  130  135   Potassium 3.5 - 5.1 mmol/L 3.9  4.1  3.6   Chloride 98 - 111 mmol/L 98  96  100   CO2 22 - 32 mmol/L 25  24  24    Calcium 8.9 - 10.3 mg/dL 8.8  8.7  8.2     [place] RT UPPER EXTREM LTD SOFT TISSUE NON VASCULAR Result Date: 12/28/2023 CLINICAL DATA:  Swelling in the right antecubital fossa with cellulitis and possible abscess at site of previous IV. EXAM: ULTRASOUND RIGHT UPPER EXTREMITY LIMITED TECHNIQUE: Ultrasound examination of the upper extremity soft tissues was performed in the area of clinical concern. COMPARISON:  None Available. FINDINGS: Examination is targeted to the right antecubital fossa. There is an ill-defined subcutaneous collection which appears partially exophytic in the area of concern. This measures approximately 1.9 x 0.9 x 1.8 cm and may reflect a phlegmon or infiltrated fluid. No organized fluid collection identified. No significant internal vascularity. Thrombosis of the adjacent cephalic vein is noted consistent with superficial thrombophlebitis. IMPRESSION: 1. Ill-defined subcutaneous collection in the right antecubital fossa may  reflect a phlegmon or infiltrated fluid. No organized fluid collection identified. 2. Superficial thrombophlebitis of the adjacent cephalic vein. Electronically Signed   By: Carey Bullocks M.D.   On: 12/28/2023 15:02     Disposition Plan & Communication  Patient status: Inpatient  Admitted From: Home Planned disposition location: Skilled nursing facility Anticipated discharge date: TBD pending clinical improvement  Family Communication: daughter on phone  Author: Leeroy Bock, DO [facility] 12/29/2023, 7:32 AM   Available by Epic secure chat 7AM-7PM. If 7PM-7AM, please contact night-coverage.  [facility] contact information found on [email or web address].

## 2023-12-29 NOTE — TOC Progression Note (Signed)
 Transition of Care [facility]) - Progression Note    Patient Details  Name: Laura Bell MRN: [id number] Date of Birth: 1935/11/23  [facility]) CM/SW Contact  Truddie Hidden, RN Phone Number: 12/29/2023, 3:24 PM  Clinical Narrative:    Spoke with patient's daughter, [name]. Patient admitted from [facility]. She is a LTC resident. Patient was recommended for STR. Patient daughter advised she can return to [facility] but Berkley Harvey is required for STR.   FL2 completed.          Expected Discharge Plan and Services                                               Social Determinants of Health (SDOH) Interventions SDOH Screenings   Food Insecurity: Patient Declined (12/25/2023)  Housing: Patient Declined (12/25/2023)  Transportation Needs: Patient Declined (12/25/2023)  Utilities: Not At Risk (12/25/2023)  Depression (PHQ2-9): Low Risk  (02/02/2020)  Financial Resource Strain: Low Risk  (05/18/2019)  Physical Activity: Inactive (05/18/2019)  Stress: Stress Concern Present (05/18/2019)  Tobacco Use: Medium Risk (12/23/2023)    Readmission Risk Interventions     No data to display

## 2023-12-30 DIAGNOSIS — I5023 Acute on chronic systolic (congestive) heart failure: Secondary | ICD-10-CM | POA: Diagnosis not present

## 2023-12-30 DIAGNOSIS — I4891 Unspecified atrial fibrillation: Secondary | ICD-10-CM | POA: Diagnosis not present

## 2023-12-30 LAB — CBC
HCT: 29.5 % — ABNORMAL LOW (ref 36.0–46.0)
Hemoglobin: 9.3 g/dL — ABNORMAL LOW (ref 12.0–15.0)
MCH: 23.8 pg — ABNORMAL LOW (ref 26.0–34.0)
MCHC: 31.5 g/dL (ref 30.0–36.0)
MCV: 75.6 fL — ABNORMAL LOW (ref 80.0–100.0)
Platelets: 334 10*3/uL (ref 150–400)
RBC: 3.9 MIL/uL (ref 3.87–5.11)
RDW: 19 % — ABNORMAL HIGH (ref 11.5–15.5)
WBC: 13 10*3/uL — ABNORMAL HIGH (ref 4.0–10.5)
nRBC: 0 % (ref 0.0–0.2)

## 2023-12-30 LAB — GLUCOSE, CAPILLARY
Glucose-Capillary: 224 mg/dL — ABNORMAL HIGH (ref 70–99)
Glucose-Capillary: 163 mg/dL — ABNORMAL HIGH (ref 70–99)
Glucose-Capillary: 157 mg/dL — ABNORMAL HIGH (ref 70–99)

## 2023-12-30 LAB — BASIC METABOLIC PANEL WITH GFR
Anion gap: 6 (ref 5–15)
BUN: 16 mg/dL (ref 8–23)
CO2: 29 mmol/L (ref 22–32)
Calcium: 8.8 mg/dL — ABNORMAL LOW (ref 8.9–10.3)
Chloride: 99 mmol/L (ref 98–111)
Creatinine, Ser: 0.95 mg/dL (ref 0.44–1.00)
GFR, Estimated: 59 mL/min — ABNORMAL LOW (ref >60)
Glucose, Bld: 175 mg/dL — ABNORMAL HIGH (ref 70–99)
Potassium: 4.1 mmol/L (ref 3.5–5.1)
Sodium: 134 mmol/L — ABNORMAL LOW (ref 135–145)

## 2023-12-30 MED ORDER — LOSARTAN POTASSIUM 25 MG PO TABS
12.5000 mg | ORAL_TABLET | Freq: Every day | ORAL | Status: DC
  Administered 2023-12-30 – 2024-01-01 (×3): 12.5 mg via ORAL
  Filled 2023-12-30 (×3): qty 1

## 2023-12-30 NOTE — Plan of Care (Signed)

## 2023-12-30 NOTE — Progress Notes (Signed)
 Progress Note  Patient Name: Laura Bell Date of Encounter: 12/30/2023  Primary Cardiologist: Mariah Milling  Subjective   Maintaining sinus rhythm.  Denies chest pain, dyspnea, palpitations, dizziness, presyncope, or syncope.  No hematochezia, melena, hemoptysis, hematemesis, or hematuria.  Hgb improved from 8.7-9.3.  Renal function continues to improve.  Blood pressure stable off midodrine.  Inpatient Medications    Scheduled Meds:  amiodarone  200 mg Oral BID   apixaban  5 mg Oral BID   cephALEXin  500 mg Oral Q8H   citalopram  10 mg Oral QPM   diclofenac Sodium  2 g Topical QID   famotidine  20 mg Oral Daily   feeding supplement  1 Container Oral BID BM   insulin aspart  0-9 Units Subcutaneous TID WC   insulin glargine-yfgn  15 Units Subcutaneous Daily   ipratropium-albuterol  3 mL Nebulization BID   lidocaine-EPINEPHrine  20 mL Intradermal Once   melatonin  5 mg Oral QHS   metoprolol succinate  100 mg Oral Daily   simvastatin  20 mg Oral q1800   Continuous Infusions:  PRN Meds: acetaminophen, dextromethorphan-guaiFENesin, levalbuterol, LORazepam, metoprolol tartrate, ondansetron (ZOFRAN) IV, mouth rinse   Vital Signs    Vitals:   12/30/23 0310 12/30/23 0504 12/30/23 0747 12/30/23 0831  BP: 137/84   (!) 121/95  Pulse: 98   99  Resp: (!) 24     Temp: (!) 97.5 F (36.4 C)   97.7 F (36.5 C)  TempSrc:      SpO2: 97%  97% 97%  Weight:  69 kg    Height:       No intake or output data in the 24 hours ending 12/30/23 0938  Filed Weights   12/28/23 0500 12/29/23 0500 12/30/23 0504  Weight: 70.5 kg 70.9 kg 69 kg    Telemetry    Sinus rhythm with sinus tachycardia with PACs and rare isolated PVCs - Personally Reviewed  ECG    No new tracings - Personally Reviewed  Physical Exam   GEN: No acute distress.   Neck: No JVD. Cardiac: RRR, no murmurs, rubs, or gallops.  Respiratory: Clear to auscultation bilaterally.  GI: Soft, nontender, non-distended.    MS: No edema; No deformity. Neuro:  Alert and oriented x 3; Nonfocal.  Psych: Normal affect.  Labs    Chemistry Recent Labs  Lab 12/23/23 1836 12/24/23 0421 12/27/23 0543 12/28/23 0445 12/30/23 0422  NA 130*   < > 130* 133* 134*  K 3.4*   < > 4.1 3.9 4.1  CL 95*   < > 96* 98 99  CO2 21*   < > 24 25 29   GLUCOSE 288*   < > 313* 207* 175*  BUN 10   < > 32* 27* 16  CREATININE 0.95   < > 1.22* 1.16* 0.95  CALCIUM 8.6*   < > 8.7* 8.8* 8.8*  PROT 6.2*  --   --   --   --   ALBUMIN 2.7*  --   --   --   --   AST 21  --   --   --   --   ALT 11  --   --   --   --   ALKPHOS 62  --   --   --   --   BILITOT 0.5  --   --   --   --   GFRNONAA 59*   < > 43* 46* 59*  ANIONGAP 14   < >  10 10 6    < > = values in this interval not displayed.     Hematology Recent Labs  Lab 12/28/23 0445 12/29/23 0505 12/30/23 0422  WBC 13.9* 13.5* 13.0*  RBC 3.51* 3.56* 3.90  HGB 8.4* 8.7* 9.3*  HCT 26.5* 27.0* 29.5*  MCV 75.5* 75.8* 75.6*  MCH 23.9* 24.4* 23.8*  MCHC 31.7 32.2 31.5  RDW 18.6* 18.6* 19.0*  PLT 346 322 334    Cardiac EnzymesNo results for input(s): "TROPONINI" in the last 168 hours. No results for input(s): "TROPIPOC" in the last 168 hours.   BNP Recent Labs  Lab 12/23/23 2254  BNP 2,941.3*     DDimer No results for input(s): "DDIMER" in the last 168 hours.   Radiology    [place] RT UPPER EXTREM LTD SOFT TISSUE NON VASCULAR Result Date: 12/28/2023 IMPRESSION: 1. Ill-defined subcutaneous collection in the right antecubital fossa may reflect a phlegmon or infiltrated fluid. No organized fluid collection identified. 2. Superficial thrombophlebitis of the adjacent cephalic vein. Electronically Signed   By: Carey Bullocks M.D.   On: 12/28/2023 15:02    Cardiac Studies   2D echo 12/26/2023: 1. Left ventricular ejection fraction, by estimation, is 20 to 25%. Left  ventricular ejection fraction by PLAX is 24 %. The left ventricle has  severely decreased function. The left  ventricle demonstrates global  hypokinesis. Left ventricular diastolic  parameters are consistent with Grade II diastolic dysfunction  (pseudonormalization).   2. Right ventricular systolic function is normal. The right ventricular  size is normal. There is normal pulmonary artery systolic pressure.   3. The mitral valve is normal in structure. Mild mitral valve  regurgitation.   4. The aortic valve is tricuspid. Aortic valve regurgitation is mild.   5. The inferior vena cava is normal in size with greater than 50%  respiratory variability, suggesting right atrial pressure of 3 mmHg.  __________  2D echo 05/31/2019: 1. The left ventricle has mild-moderately reduced systolic function, with  an ejection fraction of 40-45%. The cavity size was normal. Left  ventricular diastolic Doppler parameters are consistent with impaired  relaxation. Left ventricular diffuse  hypokinesis.   2. The right ventricle has normal systolic function. The cavity was  normal. There is no increase in right ventricular wall thickness.Unable to  estimate RVSP   Patient Profile     88 y.o. female with history of HFrEF, DM2, HTN, HLD, recurrent falls, breast/cervical cancer, and GERD who is admitted with acute hypoxic respiratory failure secondary to influenza A and RSV and being seen today for newly diagnosed A-fib.  Assessment & Plan    1.  Newly diagnosed A-fib with RVR: -Likely in the setting of acute respiratory illness -Presented in sinus rhythm -Afib started on the morning of 3/27 -Currently in sinus rhythm with sinus tachycardia likely driven by acute respiratory illness and underlying anemia -Continue Toprol-XL 100 mg for now -On amiodarone 200 mg bid, continue for 7 days total followed by 200 mg daily thereafter (4/4) -CHA2DS2-VASc at least 7 (CHF, HTN, age x 2, DM, vascular disease, sex category) -Has been transitioned from heparin drip to apixaban 5 mg twice daily (does not meet reduced dosing  criteria) -TSH normal -Mild hypokalemia repleted -Magnesium at goal -With high risk medication use, will need outpatient monitoring (LFT and TSH normal prior to initiating amiodarone)  2.  Acute on chronic HFrEF: -Appears euvolemic -Prior EF of 40 to 45% in 2020, now with EF 20-25%, possibly in the setting of acute illness  with A-fib with RVR -Currently on Toprol-XL 100 mg (PTA 75 mg) -Escalation of GDMT has previously been limited by AKI and hypotension previously requiring midodrine which has been discontinued with continued stable BP -Add losartan 12.5 mg -Continue Toprol-XL as outlined above -Would ultimately benefit from ischemic evaluation, though not currently a cath candidate given progressive anemia -Recommend repeating limited echo in the outpatient setting in sinus rhythm on maximally tolerated GDMT to trend cardiomyopathy -Not requiring standing diuretic at this time -CHF education  3.  Microcytic anemia: -Denies symptoms of bleeding -Hemoglobin improving  -Historically has ran around 11 or 12 with isolated readings in in the 9 range in 2020 -Ongoing management and evaluation per primary service -Maintain hemoglobin greater than 8  4.  AKI: -Improving  4.  Hypotension with history of hypertension: -Resolved -Midodrine stopped 3/31 -Starting losartan -Remains on Toprol-XL as above      For questions or updates, please contact [facility] Please consult [email or web address] for contact info under Cardiology/STEMI.    Signed, Eula Listen, PA-C [facility] Pager: [phone] 12/30/2023, 9:38 AM

## 2023-12-30 NOTE — Progress Notes (Signed)
 PROGRESS NOTE CHLORA MCBAIN    DOB: [birth year], 88 y.o.  JXB:[id number]    Code Status: Full Code   DOA: 12/23/2023   LOS: 6   Brief hospital course  DANNAE KATO is a 88 y.o. female with medical history significant of HTN, HLD, sCHF with EF 40-45%, depression, anemia, breast cancer, uterus cancer, who presents with SOB.   ED Course: positive PCR for flu A and RSV, WBC 7.8, potassium 3.4, mild AKI with creatinine 0.95, BUN 10 and GFR 59 (baseline creatinine 0.58 on 08/02/2020), lactic acid 2.8, temperature normal, blood pressure 121/72, heart rate 128, RR 18, oxygen saturation 92% on room air, which improved to 99% on 2 L oxygen.  Chest x-ray negative. Positive for RSV and influenza.    ZHY:QMVHQ rhythm, QTc 469, LAE, low voltage, nonspecific T wave change.  Respiratory status 3/26 difficult and requiring bipap but has stabilized and now doing well on room air with minimal respiratory symptoms. Treated sinus tach with IV metoprolol with good results.  Unexpectedly- 3/27: developed significant afib RVR refractory to metoprolol or diltizam gtt. Treated with amiodarone gtt and digoxin. Converted back to sinus rhythm but remained tachycardic so medication management was ongoing.  Presently- patient is hemodynamically stable and cardiology modifying meds to optimize afib control. On PO amiodarone and metoprolol. Heart rate is well controlled. No longer planning inpatient cath. Heparin transitioned to eliquis. Appears to have phlebitis on RUE. General surgery consulted to I&D abscess on RUE. Culture is sent and results pending. Remains on keflex and showing improvement.  Assessment & Plan  Principal Problem:   Influenza A Active Problems:   RSV (respiratory syncytial virus pneumonia)   Elevated lactic acid level   Acute on chronic HFrEF (heart failure with reduced ejection fraction) (HCC)   Hyperlipidemia associated with type 2 diabetes mellitus (HCC)   Diabetes mellitus without  complication (HCC)   Essential hypertension   AKI (acute kidney injury) (HCC)   Depression   Hypokalemia   Hypophosphatemia   Overweight (BMI 25.0-29.9)   Hypoxia   RSV (acute bronchiolitis due to respiratory syncytial virus)   SOB (shortness of breath)   New onset atrial fibrillation (HCC)   Sepsis (HCC)   Abscess of antecubital fossa  Acute hypoxic respiratory failure- resolved.  Influenza A and RSV infection:  O2 sats are stable on room air now after short time on bipap.  -Bronchodilators and prn Mucinex -Incentive spirometry -Tamiflu 30 mg twice daily completed - CTA -> negative for PE  New onset Afib-RVR: converted to sinus rhythm after started on amiodarone and digoxin. Remains mildly tachycardic.  - cardiology consulted - continue amiodarone - heparin gtt converted to eliquis - increased metoprolol since she is having tachycardia with exertion still up to 140 and is tolerating well. - midodrine was stopped by cardiology and seems to be tolerating well. Will watch BP closely on increased metoprolol dose  Mild AKI- Cr normal at baseline. Suspect at least partially from shock and levophed use. Improving Cr 1.36>1.22>1.16>0.95 - encourage PO hydration  Thrombophlebitis- RUE at site of previous IV. Examined today. Improved erythema. Non-tender. US showing phlebitis. Culture pending. - general surgery consulted for I&D and sent for culture - continue keflex - heating pad, voltaren gel, tylenol - mild compressive dressing - on eliquis  Anemia- hgb is roughly stable now. 9.7>>>9.3 partially dilutional. watch closely - transfusion threshold <7   Chronic systolic CHF: 2D echo on 05/30/2019 showed EF of 40-45%.  EF down to 20-25% this admission.  Patient does not have leg edema or JVD.  CHF seem to be compensated. No significant pulmonary edema on imaging -BNP is elevated at 2941 - s/p lasix - cardiology following   Hyperlipidemia associated with type 2 diabetes mellitus  (HCC) -Zocor   Diabetes mellitus without complication (HCC): Most recent A1c 11.7, poorly controlled.  Patient taking metformin and glipizide at home -Sliding scale insulin   Essential hypertension- holding home meds. Was on levophed and with 500cc bolus briefly but was weaned off.    Depression -continue home Celexa   Hypokalemia and hypophosphatemia: Potassium 3.4, phosphorus 2.3, magnesium 2.0 -Repleted   Overweight: Body weight 72.3 kg, BMI 29.17 -Encourage losing weight -Exercise and healthy diet  Body mass index is 27.84 kg/m.  VTE ppx: heparin gtt apixaban (ELIQUIS) tablet 5 mg  Diet:     Diet   Diet Carb Modified Fluid consistency: Thin; Room service appropriate? Yes   Consultants: Cardiology    Subjective 12/30/23    Pt reports feeling well. Had no palpitations or chest pain or SOB with ambulation. Pain in R arm is improved.    Objective   Vitals:   12/29/23 1940 12/30/23 0047 12/30/23 0310 12/30/23 0504  BP: (!) 105/92 137/82 137/84   Pulse: (!) 103 100 98   Resp: 18 20 (!) 24   Temp: 98.1 F (36.7 C) 98 F (36.7 C) (!) 97.5 F (36.4 C)   TempSrc:      SpO2: 98% 98% 97%   Weight:    69 kg  Height:       No intake or output data in the 24 hours ending 12/30/23 0742  Filed Weights   12/28/23 0500 12/29/23 0500 12/30/23 0504  Weight: 70.5 kg 70.9 kg 69 kg    Physical Exam:  General: alert and resting comfortably in bed. Just completed TTE Respiratory: normal respiratory effort. No wheezing or rales Cardiovascular: quick capillary refill, normal S1/S2, rapid heart rate, normal rhythm, no JVD, murmurs Nervous: A&O x3. no gross focal neurologic deficits, normal speech Extremities: moves all equally, no edema, normal tone. R UE at site of previous IV with mild erythema, no tenderness to palpation and abscess has been drained Skin: dry, intact, normal temperature, normal color. No rashes, lesions or ulcers on exposed skin  Labs   I have personally  reviewed the following labs and imaging studies CBC    Component Value Date/Time   WBC 13.0 (H) 12/30/2023 0422   RBC 3.90 12/30/2023 0422   HGB 9.3 (L) 12/30/2023 0422   HGB 12.9 11/26/2013 1445   HCT 29.5 (L) 12/30/2023 0422   HCT 39.5 11/26/2013 1445   PLT 334 12/30/2023 0422   PLT 273 11/26/2013 1445   MCV 75.6 (L) 12/30/2023 0422   MCV 87.6 11/26/2013 1445   MCH 23.8 (L) 12/30/2023 0422   MCHC 31.5 12/30/2023 0422   RDW 19.0 (H) 12/30/2023 0422   RDW 13.3 11/26/2013 1445   LYMPHSABS 0.6 (L) 12/23/2023 1836   LYMPHSABS 2.0 11/26/2013 1445   MONOABS 0.7 12/23/2023 1836   MONOABS 0.7 11/26/2013 1445   EOSABS 0.1 12/23/2023 1836   EOSABS 0.3 11/26/2013 1445   BASOSABS 0.0 12/23/2023 1836   BASOSABS 0.1 11/26/2013 1445      Latest Ref Rng & Units 12/30/2023    4:22 AM 12/28/2023    4:45 AM 12/27/2023    5:43 AM  BMP  Glucose 70 - 99 mg/dL 604  540  981   BUN 8 - 23 mg/dL  16  27  32   Creatinine 0.44 - 1.00 mg/dL 1.61  0.96  0.45   Sodium 135 - 145 mmol/L 134  133  130   Potassium 3.5 - 5.1 mmol/L 4.1  3.9  4.1   Chloride 98 - 111 mmol/L 99  98  96   CO2 22 - 32 mmol/L 29  25  24    Calcium 8.9 - 10.3 mg/dL 8.8  8.8  8.7     [place] RT UPPER EXTREM LTD SOFT TISSUE NON VASCULAR Result Date: 12/28/2023 CLINICAL DATA:  Swelling in the right antecubital fossa with cellulitis and possible abscess at site of previous IV. EXAM: ULTRASOUND RIGHT UPPER EXTREMITY LIMITED TECHNIQUE: Ultrasound examination of the upper extremity soft tissues was performed in the area of clinical concern. COMPARISON:  None Available. FINDINGS: Examination is targeted to the right antecubital fossa. There is an ill-defined subcutaneous collection which appears partially exophytic in the area of concern. This measures approximately 1.9 x 0.9 x 1.8 cm and may reflect a phlegmon or infiltrated fluid. No organized fluid collection identified. No significant internal vascularity. Thrombosis of the adjacent cephalic  vein is noted consistent with superficial thrombophlebitis. IMPRESSION: 1. Ill-defined subcutaneous collection in the right antecubital fossa may reflect a phlegmon or infiltrated fluid. No organized fluid collection identified. 2. Superficial thrombophlebitis of the adjacent cephalic vein. Electronically Signed   By: Carey Bullocks M.D.   On: 12/28/2023 15:02     Disposition Plan & Communication  Patient status: Inpatient  Admitted From: Home Planned disposition location: Skilled nursing facility Anticipated discharge date: TBD pending clinical improvement  Family Communication: daughter- voicemail  Author: Leeroy Bock, DO [facility] 12/30/2023, 7:42 AM   Available by Epic secure chat 7AM-7PM. If 7PM-7AM, please contact night-coverage.  [facility] contact information found on [email or web address].

## 2023-12-30 NOTE — Progress Notes (Signed)
 [facility] SURGICAL PROGRESS NOTE  Hospital Day(s): 6.   Interval History:  Patient seen and examined No acute events or new complaints overnight.  Patient reports her right AC fossa pain is improved No fever WBC to 13.0K; improved Cx still pending She is on Keflex   Vital signs in last 24 hours: [min-max] current  Temp:  [97.5 F (36.4 C)-98.5 F (36.9 C)] 97.5 F (36.4 C) (04/01 0310) Pulse Rate:  [96-105] 98 (04/01 0310) Resp:  [18-24] 24 (04/01 0310) BP: (105-137)/(70-92) 137/84 (04/01 0310) SpO2:  [95 %-98 %] 97 % (04/01 0310) Weight:  [69 kg] 69 kg (04/01 0504)     Height: 5\' 2"  (157.5 cm) Weight: 69 kg BMI (Calculated): 27.83   Intake/Output last 2 shifts:  No intake/output data recorded.   Physical Exam:  Constitutional: alert, cooperative and no distress  Respiratory: breathing non-labored at rest Integumentary: Right AC fossa swelling improved, tenderness improved, there is still scant amount of purulence expressible, this drains easily, no evidence of necrotizing infection   Labs:     Latest Ref Rng & Units 12/30/2023    4:22 AM 12/29/2023    5:05 AM 12/28/2023    4:45 AM  CBC  WBC 4.0 - 10.5 K/uL 13.0  13.5  13.9   Hemoglobin 12.0 - 15.0 g/dL 9.3  8.7  8.4   Hematocrit 36.0 - 46.0 % 29.5  27.0  26.5   Platelets 150 - 400 K/uL 334  322  346       Latest Ref Rng & Units 12/30/2023    4:22 AM 12/28/2023    4:45 AM 12/27/2023    5:43 AM  CMP  Glucose 70 - 99 mg/dL 161  096  045   BUN 8 - 23 mg/dL 16  27  32   Creatinine 0.44 - 1.00 mg/dL 4.09  8.11  9.14   Sodium 135 - 145 mmol/L 134  133  130   Potassium 3.5 - 5.1 mmol/L 4.1  3.9  4.1   Chloride 98 - 111 mmol/L 99  98  96   CO2 22 - 32 mmol/L 29  25  24    Calcium 8.9 - 10.3 mg/dL 8.8  8.8  8.7      Imaging studies: No new pertinent imaging studies   Assessment/Plan:  88 y.o. female with spontaneously draining right AC fossa abscess at previous PIV site.    - Continue Abx  (Keflex); follow up Cx when available   - Okay to do superficial dressings daily and as needed  - Okay to continue warm compresses for superficial thrombophlebitis  - Further management per primary service; we will remain available to assist as needed, please call with questions/concerns   All of the above findings and recommendations were discussed with the patient, and the medical team, and all of patient's questions were answered to her expressed satisfaction.  -- Lynden Oxford, PA-C [facility] 12/30/2023, 7:22 AM M-F: 7am - 4pm

## 2023-12-31 DIAGNOSIS — J101 Influenza due to other identified influenza virus with other respiratory manifestations: Secondary | ICD-10-CM | POA: Diagnosis not present

## 2023-12-31 DIAGNOSIS — I5023 Acute on chronic systolic (congestive) heart failure: Secondary | ICD-10-CM | POA: Diagnosis not present

## 2023-12-31 DIAGNOSIS — I4891 Unspecified atrial fibrillation: Secondary | ICD-10-CM | POA: Diagnosis not present

## 2023-12-31 LAB — GLUCOSE, CAPILLARY
Glucose-Capillary: 262 mg/dL — ABNORMAL HIGH (ref 70–99)
Glucose-Capillary: 259 mg/dL — ABNORMAL HIGH (ref 70–99)
Glucose-Capillary: 180 mg/dL — ABNORMAL HIGH (ref 70–99)

## 2023-12-31 MED ORDER — FUROSEMIDE 10 MG/ML IJ SOLN
20.0000 mg | Freq: Once | INTRAMUSCULAR | Status: AC
  Administered 2023-12-31: 20 mg via INTRAVENOUS
  Filled 2023-12-31: qty 2

## 2023-12-31 MED ORDER — DOCUSATE SODIUM 100 MG PO CAPS
200.0000 mg | ORAL_CAPSULE | Freq: Two times a day (BID) | ORAL | Status: DC
  Administered 2023-12-31 – 2024-01-01 (×3): 200 mg via ORAL
  Filled 2023-12-31 (×3): qty 2

## 2023-12-31 MED ORDER — BISACODYL 5 MG PO TBEC: 10.0000 mg | DELAYED_RELEASE_TABLET | Freq: Every day | ORAL | Status: DC | PRN

## 2023-12-31 NOTE — Progress Notes (Signed)
 PROGRESS NOTE    Laura Bell  ZOX:[id number] DOB: 1936-02-04 DOA: 12/23/2023 PCP: Judy Pimple, MD    Assessment & Plan:   Principal Problem:   Influenza A Active Problems:   RSV (respiratory syncytial virus pneumonia)   Elevated lactic acid level   Acute on chronic HFrEF (heart failure with reduced ejection fraction) (HCC)   Hyperlipidemia associated with type 2 diabetes mellitus (HCC)   Diabetes mellitus without complication (HCC)   Essential hypertension   AKI (acute kidney injury) (HCC)   Depression   Hypokalemia   Hypophosphatemia   Overweight (BMI 25.0-29.9)   Hypoxia   RSV (acute bronchiolitis due to respiratory syncytial virus)   SOB (shortness of breath)   New onset atrial fibrillation (HCC)   Sepsis (HCC)   Abscess of antecubital fossa  Assessment and Plan: Acute hypoxic respiratory failure: likely secondary to influenza & RSV. Continue on bronchodilators & encourage incentive spirometry. Completed tamiflu. Resolved     A. fib: w/ RVR. New onset. Continue converted to sinus rhythm after started on amiodarone and digoxin. Continue on amiodarone, metoprolol, eliquis. Midodrine was d/c by cardio   AKI: resolved   Thrombophlebitis of RUE at site of previous IV.US showing phlebitis. Continue on keflex. Continue w/ wound care   Microcytic anemia: w/ check iron panel.   Chronic systolic CHF: echo shows EF down to 20-25%. CHF appears compensated. S/p IV lasix as per cardio. Monitor I/Os   HLD: continue on statin  DM2:HbA1c 11.7, poorly controlled.  Continue on glargine, SSI w/ accuchecks   HTN: continue on metoprolol  Depression: severity unknown. Continue on home dose of citalopram   Hypokalemia: WNL today   Overweight: BMI 27.8. Would benefit from weight loss         DVT prophylaxis: eliquis Code Status: full Family Communication:  Disposition Plan: likely d/c to SNF   Status is: Inpatient Remains inpatient appropriate because: severity  of illness    Level of care: Telemetry Cardiac Consultants:  Cardio   Procedures:   Antimicrobials: keflex   Subjective: Pt c/o fatigue   Objective: Vitals:   12/31/23 0359 12/31/23 0518 12/31/23 0726 12/31/23 0758  BP: 137/71   126/86  Pulse: 95   (!) 107  Resp: 20   16  Temp: (!) 97.5 F (36.4 C)   97.8 F (36.6 C)  TempSrc: Oral     SpO2: 99%  97% 100%  Weight:  69 kg    Height:        Intake/Output Summary (Last 24 hours) at 12/31/2023 0820 Last data filed at 12/30/2023 1059 Gross per 24 hour  Intake 200 ml  Output --  Net 200 ml   Filed Weights   12/29/23 0500 12/30/23 0504 12/31/23 0518  Weight: 70.9 kg 69 kg 69 kg    Examination:  General exam: Appears calm and comfortable  Respiratory system: decreased breath sounds b/l  Cardiovascular system: S1 & S2 +. No rubs, gallops or clicks.  Gastrointestinal system: Abdomen is nondistended, soft and nontender. Normal bowel sounds heard. Central nervous system: Alert and awake. Moves all extremities  Psychiatry: Judgement and insight appears at baseline. Flat mood and affect    Data Reviewed: I have personally reviewed following labs and imaging studies  CBC: Recent Labs  Lab 12/26/23 0138 12/27/23 0543 12/28/23 0445 12/29/23 0505 12/30/23 0422  WBC 12.6* 14.9* 13.9* 13.5* 13.0*  HGB 8.5* 8.2* 8.4* 8.7* 9.3*  HCT 26.5* 25.8* 26.5* 27.0* 29.5*  MCV 74.9* 76.3* 75.5* 75.8*  75.6*  PLT 366 346 346 322 334   Basic Metabolic Panel: Recent Labs  Lab 12/25/23 0600 12/26/23 0138 12/27/23 0543 12/28/23 0445 12/30/23 0422  NA 134* 135 130* 133* 134*  K 3.4* 3.6 4.1 3.9 4.1  CL 98 100 96* 98 99  CO2 19* 24 24 25 29   GLUCOSE 320* 292* 313* 207* 175*  BUN 18 26* 32* 27* 16  CREATININE 1.14* 1.36* 1.22* 1.16* 0.95  CALCIUM 8.6* 8.2* 8.7* 8.8* 8.8*   GFR: Estimated Creatinine Clearance: 39.4 mL/min (by C-G formula based on SCr of 0.95 mg/dL). Liver Function Tests: No results for input(s): "AST",  "ALT", "ALKPHOS", "BILITOT", "PROT", "ALBUMIN" in the last 168 hours. No results for input(s): "LIPASE", "AMYLASE" in the last 168 hours. No results for input(s): "AMMONIA" in the last 168 hours. Coagulation Profile: Recent Labs  Lab 12/25/23 1434  INR 1.2   Cardiac Enzymes: No results for input(s): "CKTOTAL", "CKMB", "CKMBINDEX", "TROPONINI" in the last 168 hours. BNP (last 3 results) No results for input(s): "PROBNP" in the last 8760 hours. HbA1C: No results for input(s): "HGBA1C" in the last 72 hours. CBG: Recent Labs  Lab 12/29/23 1710 12/30/23 0833 12/30/23 1232 12/30/23 1552 12/31/23 0756  GLUCAP 274* 224* 163* 157* 262*   Lipid Profile: No results for input(s): "CHOL", "HDL", "LDLCALC", "TRIG", "CHOLHDL", "LDLDIRECT" in the last 72 hours. Thyroid Function Tests: No results for input(s): "TSH", "T4TOTAL", "FREET4", "T3FREE", "THYROIDAB" in the last 72 hours. Anemia Panel: No results for input(s): "VITAMINB12", "FOLATE", "FERRITIN", "TIBC", "IRON", "RETICCTPCT" in the last 72 hours. Sepsis Labs: No results for input(s): "PROCALCITON", "LATICACIDVEN" in the last 168 hours.  Recent Results (from the past 240 hours)  Culture, blood (Routine x 2)     Status: None   Collection Time: 12/23/23  6:36 PM   Specimen: BLOOD  Result Value Ref Range Status   Specimen Description BLOOD BLOOD RIGHT ARM  Final   Special Requests   Final    BOTTLES DRAWN AEROBIC AND ANAEROBIC Blood Culture results may not be optimal due to an inadequate volume of blood received in culture bottles   Culture   Final    NO GROWTH 5 DAYS Performed at [facility], [address], [place], [place] [address]    Report Status 12/28/2023 FINAL  Final  Culture, blood (Routine x 2)     Status: None   Collection Time: 12/23/23  6:38 PM   Specimen: BLOOD  Result Value Ref Range Status   Specimen Description BLOOD BLOOD RIGHT ARM  Final   Special Requests   Final    BOTTLES DRAWN AEROBIC AND  ANAEROBIC Blood Culture adequate volume   Culture   Final    NO GROWTH 5 DAYS Performed at [facility], [address] [place], [place] [address]    Report Status 12/28/2023 FINAL  Final  Resp panel by RT-PCR (RSV, Flu A&B, Covid) Anterior Nasal Swab     Status: Abnormal   Collection Time: 12/23/23  6:47 PM   Specimen: Anterior Nasal Swab  Result Value Ref Range Status   SARS Coronavirus 2 by RT PCR NEGATIVE NEGATIVE Final    Comment: (NOTE) SARS-CoV-2 target nucleic acids are NOT DETECTED.  The SARS-CoV-2 RNA is generally detectable in upper respiratory specimens during the acute phase of infection. The lowest concentration of SARS-CoV-2 viral copies this assay can detect is 138 copies/mL. A negative result does not preclude SARS-Cov-2 infection and should not be used as the sole basis for  treatment or other patient management decisions. A negative result may occur with  improper specimen collection/handling, submission of specimen other than nasopharyngeal swab, presence of viral mutation(s) within the areas targeted by this assay, and inadequate number of viral copies(<138 copies/mL). A negative result must be combined with clinical observations, patient history, and epidemiological information. The expected result is Negative.  Fact Sheet for Patients:  BloggerCourse.com  Fact Sheet for Healthcare Providers:  SeriousBroker.it  This test is no t yet approved or cleared by the [place] FDA and  has been authorized for detection and/or diagnosis of SARS-CoV-2 by FDA under an Emergency Use Authorization (EUA). This EUA will remain  in effect (meaning this test can be used) for the duration of the COVID-19 declaration under Section 564(b)(1) of the Act, 21 U.S.C.section 360bbb-3(b)(1), unless the authorization is terminated  or revoked sooner.       Influenza A by PCR POSITIVE (A) NEGATIVE Final   Influenza B  by PCR NEGATIVE NEGATIVE Final    Comment: (NOTE) The Xpert Xpress SARS-CoV-2/FLU/RSV plus assay is intended as an aid in the diagnosis of influenza from Nasopharyngeal swab specimens and should not be used as a sole basis for treatment. Nasal washings and aspirates are unacceptable for Xpert Xpress SARS-CoV-2/FLU/RSV testing.  Fact Sheet for Patients: BloggerCourse.com  Fact Sheet for Healthcare Providers: SeriousBroker.it  This test is not yet approved or cleared by the [place] FDA and has been authorized for detection and/or diagnosis of SARS-CoV-2 by FDA under an Emergency Use Authorization (EUA). This EUA will remain in effect (meaning this test can be used) for the duration of the COVID-19 declaration under Section 564(b)(1) of the Act, 21 U.S.C. section 360bbb-3(b)(1), unless the authorization is terminated or revoked.     Resp Syncytial Virus by PCR POSITIVE (A) NEGATIVE Final    Comment: (NOTE) Fact Sheet for Patients: BloggerCourse.com  Fact Sheet for Healthcare Providers: SeriousBroker.it  This test is not yet approved or cleared by the [place] FDA and has been authorized for detection and/or diagnosis of SARS-CoV-2 by FDA under an Emergency Use Authorization (EUA). This EUA will remain in effect (meaning this test can be used) for the duration of the COVID-19 declaration under Section 564(b)(1) of the Act, 21 U.S.C. section 360bbb-3(b)(1), unless the authorization is terminated or revoked.  Performed at [facility], [address] [place], [place] [address]   MRSA Next Gen by PCR, Nasal     Status: None   Collection Time: 12/27/23  8:19 AM   Specimen: Nasal Mucosa; Nasal Swab  Result Value Ref Range Status   MRSA by PCR Next Gen NOT DETECTED NOT DETECTED Final    Comment: (NOTE) The GeneXpert MRSA Assay (FDA approved for NASAL specimens  only), is one component of a comprehensive MRSA colonization surveillance program. It is not intended to diagnose MRSA infection nor to guide or monitor treatment for MRSA infections. Test performance is not FDA approved in patients less than 46 years old. Performed at [facility], [address] [place], [place] [address]          Radiology Studies: No results found.      Scheduled Meds:  amiodarone  200 mg Oral BID   apixaban  5 mg Oral BID   cephALEXin  500 mg Oral Q8H   citalopram  10 mg Oral QPM   famotidine  20 mg Oral Daily   feeding supplement  1 Container Oral BID BM   insulin aspart  0-9 Units Subcutaneous TID WC   insulin glargine-yfgn  15 Units Subcutaneous Daily   ipratropium-albuterol  3 mL Nebulization BID   lidocaine-EPINEPHrine  20 mL Intradermal Once   losartan  12.5 mg Oral Daily   melatonin  5 mg Oral QHS   metoprolol succinate  100 mg Oral Daily   simvastatin  20 mg Oral q1800   Continuous Infusions:   LOS: 7 days      Charise Killian, MD [facility] Pager [phone]  If 7PM-7AM, please contact night-coverage [email or web address] 12/31/2023, 8:20 AM

## 2023-12-31 NOTE — Progress Notes (Signed)
 Physical Therapy Treatment Patient Details Name: Laura Bell MRN: [id number] DOB: July 08, 1936 Today's Date: 12/31/2023   History of Present Illness Pt is an 88 year old female who presents with SOB, work up included Influenza A, RSV with acute hypoxic respiratory failure, afib with RVR, AKI, hypokalemia and hypophosphatemia. PMH significant for HTN, HLD, sCHF with EF 40-45%, depression, anemia, breast cancer, and uterus cancer.    PT Comments  Pt in bed on entyr, awake and excited at prospect of getting OOB. Pt moves well to EOB, but needs a little elevation of bed to make transfers moderate in effort. Pt partakes in AMB at bedside 3 times, limited to ~44ft each time and reportedly fatigued completely each time, but appears ot recover well with a brief seated rest interval. Pt needs extensive cues for safe use of RW which does not transfer to subsequent bouts of activity. PT in bed at end of session, HOB >50 degrees, all needs met. VSS in session, pt tolerating much more activity today compared to prior days with out services.    If plan is discharge home, recommend the following: A little help with walking and/or transfers;A little help with bathing/dressing/bathroom;Assist for transportation;Help with stairs or ramp for entrance;Assistance with cooking/housework   Can travel by private vehicle     No  Equipment Recommendations  None recommended by PT    Recommendations for Other Services       Precautions / Restrictions Precautions Precautions: Fall Recall of Precautions/Restrictions: Impaired Restrictions Weight Bearing Restrictions Per Provider Order: No     Mobility  Bed Mobility Overal bed mobility: Needs Assistance Bed Mobility: Sit to Supine, Supine to Sit     Supine to sit: Contact guard, HOB elevated Sit to supine: Min assist (heavy effort)        Transfers Overall transfer level: Needs assistance Equipment used: Rolling walker (2 wheels) Transfers: Sit  to/from Stand Sit to Stand: From elevated surface, Contact guard assist           General transfer comment: sveveral times in session, does well from slight elevation, recurrent cues for hand placement    Ambulation/Gait   Gait Distance (Feet): 5 Feet (71ft, rest 53ft, rest 27ft, rest) Assistive device: Rolling walker (2 wheels) Gait Pattern/deviations: Step-to pattern Gait velocity: alternating fwd and backward at bedside, then pt collapses back onto bed due to fatigue. VSS     General Gait Details: lacks intuitive use of RW in tight spaces, frequenct cues to maintain RW in optimal position for balance utility   Stairs             Wheelchair Mobility     Tilt Bed    Modified Rankin (Stroke Patients Only)       Balance Overall balance assessment: Needs assistance (heavy UE support on RW to maintain steadiness, but often collapses on bed without warning)                                          Communication    Cognition Arousal: Alert Behavior During Therapy: WFL for tasks assessed/performed   PT - Cognitive impairments: History of cognitive impairments                                Cueing    Exercises      General Comments  Pertinent Vitals/Pain Pain Assessment Pain Assessment: No/denies pain    Home Living                          Prior Function            PT Goals (current goals can now be found in the care plan section) Acute Rehab PT Goals PT Goal Formulation: Patient unable to participate in goal setting    Frequency    Min 2X/week      PT Plan      Co-evaluation              AM-PAC PT "6 Clicks" Mobility   Outcome Measure  Help needed turning from your back to your side while in a flat bed without using bedrails?: A Lot Help needed moving from lying on your back to sitting on the side of a flat bed without using bedrails?: A Lot Help needed moving to and from a bed to a  chair (including a wheelchair)?: A Lot Help needed standing up from a chair using your arms (e.g., wheelchair or bedside chair)?: A Lot Help needed to walk in hospital room?: A Lot Help needed climbing 3-5 steps with a railing? : A Lot 6 Click Score: 12    End of Session   Activity Tolerance: Patient tolerated treatment well;No increased pain;Patient limited by fatigue Patient left: in bed;with call bell/phone within reach;with bed alarm set (chair gren box has low batteries, triggering call bell system) Nurse Communication: Mobility status PT Visit Diagnosis: Muscle weakness (generalized) (M62.81);Unsteadiness on feet (R26.81)     Time: 1030-1049 PT Time Calculation (min) (ACUTE ONLY): 19 min  Charges:    $Therapeutic Activity: 8-22 mins PT General Charges $$ ACUTE PT VISIT: 1 Visit                1:47 PM, 12/31/23 [name], PT, DPT Physical Therapist - [facility]  [phone] (ASCOM)    [name] C 12/31/2023, 1:45 PM

## 2023-12-31 NOTE — Plan of Care (Signed)

## 2023-12-31 NOTE — Progress Notes (Signed)
 Progress Note  Patient Name: Laura Bell Date of Encounter: 12/31/2023  Primary Cardiologist: Mariah Milling  Subjective   Maintaining sinus rhythm.  Denies chest pain, dyspnea, palpitations, dizziness, presyncope, or syncope.  No hematochezia, melena, hemoptysis, hematemesis, or hematuria.  Hgb stable and renal function improved on last check.  Blood pressure stable off midodrine.  Has tolerated the addition of losartan.   Inpatient Medications    Scheduled Meds:  amiodarone  200 mg Oral BID   apixaban  5 mg Oral BID   cephALEXin  500 mg Oral Q8H   citalopram  10 mg Oral QPM   famotidine  20 mg Oral Daily   feeding supplement  1 Container Oral BID BM   furosemide  20 mg Intravenous Once   insulin aspart  0-9 Units Subcutaneous TID WC   insulin glargine-yfgn  15 Units Subcutaneous Daily   ipratropium-albuterol  3 mL Nebulization BID   lidocaine-EPINEPHrine  20 mL Intradermal Once   losartan  12.5 mg Oral Daily   melatonin  5 mg Oral QHS   metoprolol succinate  100 mg Oral Daily   simvastatin  20 mg Oral q1800   Continuous Infusions:  PRN Meds: acetaminophen, dextromethorphan-guaiFENesin, levalbuterol, LORazepam, metoprolol tartrate, ondansetron (ZOFRAN) IV, mouth rinse   Vital Signs    Vitals:   12/31/23 0359 12/31/23 0518 12/31/23 0726 12/31/23 0758  BP: 137/71   126/86  Pulse: 95   (!) 107  Resp: 20   16  Temp: (!) 97.5 F (36.4 C)   97.8 F (36.6 C)  TempSrc: Oral     SpO2: 99%  97% 100%  Weight:  69 kg    Height:        Intake/Output Summary (Last 24 hours) at 12/31/2023 0911 Last data filed at 12/30/2023 1059 Gross per 24 hour  Intake 200 ml  Output --  Net 200 ml    Filed Weights   12/29/23 0500 12/30/23 0504 12/31/23 0518  Weight: 70.9 kg 69 kg 69 kg    Telemetry    Sinus rhythm with sinus tachycardia with PACs and rare isolated PVCs - Personally Reviewed  ECG    No new tracings - Personally Reviewed  Physical Exam   GEN: No acute  distress.   Neck: No JVD. Cardiac: RRR, no murmurs, rubs, or gallops.  Respiratory: Mildly diminished with faint crackles bilaterally.  GI: Soft, nontender, non-distended.   MS: No edema; No deformity. Neuro:  Alert and oriented x 3; Nonfocal.  Psych: Normal affect.  Labs    Chemistry Recent Labs  Lab 12/27/23 0543 12/28/23 0445 12/30/23 0422  NA 130* 133* 134*  K 4.1 3.9 4.1  CL 96* 98 99  CO2 24 25 29   GLUCOSE 313* 207* 175*  BUN 32* 27* 16  CREATININE 1.22* 1.16* 0.95  CALCIUM 8.7* 8.8* 8.8*  GFRNONAA 43* 46* 59*  ANIONGAP 10 10 6      Hematology Recent Labs  Lab 12/28/23 0445 12/29/23 0505 12/30/23 0422  WBC 13.9* 13.5* 13.0*  RBC 3.51* 3.56* 3.90  HGB 8.4* 8.7* 9.3*  HCT 26.5* 27.0* 29.5*  MCV 75.5* 75.8* 75.6*  MCH 23.9* 24.4* 23.8*  MCHC 31.7 32.2 31.5  RDW 18.6* 18.6* 19.0*  PLT 346 322 334    Cardiac EnzymesNo results for input(s): "TROPONINI" in the last 168 hours. No results for input(s): "TROPIPOC" in the last 168 hours.   BNP No results for input(s): "BNP", "PROBNP" in the last 168 hours.    DDimer No  results for input(s): "DDIMER" in the last 168 hours.   Radiology    [place] RT UPPER EXTREM LTD SOFT TISSUE NON VASCULAR Result Date: 12/28/2023 IMPRESSION: 1. Ill-defined subcutaneous collection in the right antecubital fossa may reflect a phlegmon or infiltrated fluid. No organized fluid collection identified. 2. Superficial thrombophlebitis of the adjacent cephalic vein. Electronically Signed   By: Carey Bullocks M.D.   On: 12/28/2023 15:02    Cardiac Studies   2D echo 12/26/2023: 1. Left ventricular ejection fraction, by estimation, is 20 to 25%. Left  ventricular ejection fraction by PLAX is 24 %. The left ventricle has  severely decreased function. The left ventricle demonstrates global  hypokinesis. Left ventricular diastolic  parameters are consistent with Grade II diastolic dysfunction  (pseudonormalization).   2. Right ventricular  systolic function is normal. The right ventricular  size is normal. There is normal pulmonary artery systolic pressure.   3. The mitral valve is normal in structure. Mild mitral valve  regurgitation.   4. The aortic valve is tricuspid. Aortic valve regurgitation is mild.   5. The inferior vena cava is normal in size with greater than 50%  respiratory variability, suggesting right atrial pressure of 3 mmHg.  __________  2D echo 05/31/2019: 1. The left ventricle has mild-moderately reduced systolic function, with  an ejection fraction of 40-45%. The cavity size was normal. Left  ventricular diastolic Doppler parameters are consistent with impaired  relaxation. Left ventricular diffuse  hypokinesis.   2. The right ventricle has normal systolic function. The cavity was  normal. There is no increase in right ventricular wall thickness.Unable to  estimate RVSP   Patient Profile     88 y.o. female with history of HFrEF, DM2, HTN, HLD, recurrent falls, breast/cervical cancer, and GERD who is admitted with acute hypoxic respiratory failure secondary to influenza A and RSV and being seen today for newly diagnosed A-fib.  Assessment & Plan    1.  Newly diagnosed A-fib with RVR: -Likely in the setting of acute respiratory illness -Presented in sinus rhythm -Afib started on the morning of 3/27 -Currently in sinus rhythm with sinus tachycardia likely driven by acute respiratory illness and underlying anemia -Continue Toprol-XL 100 mg for now -On amiodarone 200 mg bid, continue for 7 days total followed by 200 mg daily thereafter (4/4) -CHA2DS2-VASc at least 7 (CHF, HTN, age x 2, DM, vascular disease, sex category) -Has been transitioned from heparin drip to apixaban 5 mg twice daily (does not meet reduced dosing criteria) -TSH normal -Mild hypokalemia repleted -Magnesium at goal -With high risk medication use, will need outpatient monitoring (LFT and TSH normal prior to initiating  amiodarone)  2.  Acute on chronic HFrEF: -She does appear mildly volume up this morning -Give a one-time dose of IV Lasix 20 mg -Prior EF of 40 to 45% in 2020, now with EF 20-25%, possibly in the setting of acute illness with A-fib with RVR -Currently on Toprol-XL 100 mg (PTA 75 mg) -Escalation of GDMT has previously been limited by AKI and hypotension previously requiring midodrine which has been discontinued with continued stable BP -Continue Toprol-XL and losartan  -Would ultimately benefit from ischemic evaluation, though not currently a cath candidate given progressive anemia -Recommend repeating limited echo in the outpatient setting in sinus rhythm on maximally tolerated GDMT to trend cardiomyopathy -Not requiring standing diuretic at this time -CHF education  3.  Microcytic anemia: -Denies symptoms of bleeding -Hemoglobin improving  -Historically has ran around 11 or 12 with  isolated readings in in the 9 range in 2020 -Ongoing management and evaluation per primary service -Maintain hemoglobin greater than 8  4.  AKI: -Improving  4.  Hypotension with history of hypertension: -Resolved -Midodrine stopped 3/31 -Losartan and Toprol       For questions or updates, please contact [facility] Please consult [email or web address] for contact info under Cardiology/STEMI.    Signed, Eula Listen, PA-C [facility] Pager: [phone] 12/31/2023, 9:11 AM

## 2023-12-31 NOTE — [place] FL2 (Signed)
 [place] MEDICAID FL2 LEVEL OF CARE FORM     IDENTIFICATION  Patient Name: Laura Bell Birthdate: July 20, 1936 Sex: female Admission Date (Current Location): 12/23/2023  [place] and [place] Number:  Chiropodist and Address:  [facility], [address], [place], [place] [address]      Provider Number: [id number]  Attending Physician Name and Address:  Charise Killian, MD  Relative Name and Phone Number:  Tawny Hopping (Daughter)  [phone] (Mobile)    Current Level of Care: Hospital Recommended Level of Care: Skilled Nursing Facility Prior Approval Number:    Date Approved/Denied:   PASRR Number: [id number]  Discharge Plan: SNF    Current Diagnoses: Patient Active Problem List   Diagnosis Date Noted   Abscess of antecubital fossa 12/29/2023   Hypoxia 12/25/2023   RSV (acute bronchiolitis due to respiratory syncytial virus) 12/25/2023   SOB (shortness of breath) 12/25/2023   New onset atrial fibrillation (HCC) 12/25/2023   Sepsis (HCC) 12/25/2023   Influenza A 12/23/2023   RSV (respiratory syncytial virus pneumonia) 12/23/2023   Diabetes mellitus without complication (HCC) 12/23/2023   Acute on chronic HFrEF (heart failure with reduced ejection fraction) (HCC) 12/23/2023   Depression 12/23/2023   Elevated lactic acid level 12/23/2023   AKI (acute kidney injury) (HCC) 12/23/2023   Hypokalemia 12/23/2023   Overweight (BMI 25.0-29.9) 12/23/2023   Hypophosphatemia 12/23/2023   Change in stool 06/12/2020   Screening-pulmonary TB 05/29/2020   Vaginitis 05/18/2020   Generalized weakness 02/21/2020   Anemia 12/23/2019   Cardiomyopathy (HCC) 10/23/2019   History of UTI 10/22/2019   Requires assistance with activities of daily living (ADL) 10/22/2019   Pubic ramus fracture (HCC) 09/04/2019   Hematoma of right thigh 09/04/2019   Chronic vulvitis 08/31/2019   Fall 06/08/2019   Sinus tachycardia 02/18/2019   Hip injury,  right, initial encounter 02/09/2019   Right foot pain 02/09/2019   Frequent falls 02/09/2019   Right leg injury, initial encounter 11/03/2018   Cystocele, midline 07/14/2018   Vaginal atrophy 07/14/2018   B12 deficiency 04/03/2018   Poor balance 04/01/2018   Anxiety 04/01/2018   Hip arthritis 09/03/2017   Arthritis of knee, degenerative 09/03/2017   Groin pain, right 09/02/2017   Knee pain, right 09/02/2017   Adverse effects of medication 01/29/2017   Fatigue 01/29/2017   Routine general medical examination at a health care facility 04/22/2016   Estrogen deficiency 04/22/2016   Dysuria 10/18/2015   Vaginal itching 11/15/2014   History of cervical cancer 03/08/2014   Urinary frequency 02/28/2014   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   Malignant neoplasm of lower-outer quadrant of left breast of female, estrogen receptor positive (HCC) 11/26/2013   Encounter for Medicare annual wellness exam 08/31/2013   Stress reaction 09/01/2012   Other screening mammogram 07/29/2012   Post-menopausal 07/29/2012   Neck pain on left side 12/03/2011   Overweight 10/31/2011   Arthropathy of hand 09/07/2008   Allergic rhinitis 04/18/2008   BACK PAIN, LUMBAR 11/24/2007   Personal history presenting hazards to health 10/09/2007   Hyperlipidemia associated with type 2 diabetes mellitus (HCC) 01/27/2007   VARICOSE VEINS, LOWER EXTREMITIES 01/27/2007   Diabetes type 2, uncontrolled 01/06/2007   Essential hypertension 01/06/2007   GERD 01/06/2007   Osteoarthritis 01/06/2007   Osteopenia 01/06/2007   Urinary incontinence 01/06/2007    Orientation RESPIRATION BLADDER Height & Weight     Self, Place  Normal (Bipap discontinued on 3/27) Incontinent Weight: 152 lb 1.6  oz (69 kg) Height:  5\' 2"  (157.5 cm)  BEHAVIORAL SYMPTOMS/MOOD NEUROLOGICAL BOWEL NUTRITION STATUS   (None)  (None) Incontinent Diet (Carb modified)  AMBULATORY STATUS COMMUNICATION OF NEEDS Skin   Limited Assist  Verbally Bruising, PU Stage and Appropriate Care     PU Stage 3 Dressing: Daily (Buttocks: Foam.)                 Personal Care Assistance Level of Assistance  Bathing, Feeding, Dressing Bathing Assistance: Limited assistance Feeding assistance: Limited assistance Dressing Assistance: Limited assistance     Functional Limitations Info  Sight, Hearing, Speech Sight Info: Adequate Hearing Info: Adequate Speech Info: Adequate    SPECIAL CARE FACTORS FREQUENCY  PT (By licensed PT), OT (By licensed OT)     PT Frequency: 5 x week OT Frequency: 5 x week            Contractures Contractures Info: Not present    Additional Factors Info  Code Status, Allergies, Isolation Precautions Code Status Info: Full code Allergies Info: Actos (Pioglitazone), Alendronate Sodium, Boniva (Ibandronate Sodium), Ibandronate, Lansoprazole, Omeprazole     Isolation Precautions Info: Droplet precautions     Current Medications (12/31/2023):  This is the current hospital active medication list Current Facility-Administered Medications  Medication Dose Route Frequency Provider Last Rate Last Admin   acetaminophen (TYLENOL) tablet 650 mg  650 mg Oral Q6H PRN Lorretta Harp, MD       amiodarone (PACERONE) tablet 200 mg  200 mg Oral BID Lorine Bears A, MD   200 mg at 12/31/23 4098   apixaban (ELIQUIS) tablet 5 mg  5 mg Oral BID Barrie Folk, RPH   5 mg at 12/31/23 1191   bisacodyl (DULCOLAX) EC tablet 10 mg  10 mg Oral Daily PRN Charise Killian, MD       cephALEXin [facility]) capsule 500 mg  500 mg Oral Q8H Jamelle Rushing L, MD   500 mg at 12/31/23 1305   citalopram (CELEXA) tablet 10 mg  10 mg Oral QPM Lorretta Harp, MD   10 mg at 12/30/23 1708   dextromethorphan-guaiFENesin (MUCINEX DM) 30-600 MG per 12 hr tablet 1 tablet  1 tablet Oral BID PRN Lorretta Harp, MD       docusate sodium (COLACE) capsule 200 mg  200 mg Oral BID Charise Killian, MD   200 mg at 12/31/23 1305   famotidine  (PEPCID) tablet 20 mg  20 mg Oral Daily Lorretta Harp, MD   20 mg at 12/31/23 4782   feeding supplement (BOOST / RESOURCE BREEZE) liquid 1 Container  1 Container Oral BID BM Leeroy Bock, MD   1 Container at 12/31/23 1305   insulin aspart (novoLOG) injection 0-9 Units  0-9 Units Subcutaneous TID WC Lorretta Harp, MD   5 Units at 12/31/23 1216   insulin glargine-yfgn (SEMGLEE) injection 15 Units  15 Units Subcutaneous Daily Leeroy Bock, MD   15 Units at 12/31/23 0913   ipratropium-albuterol (DUONEB) 0.5-2.5 (3) MG/3ML nebulizer solution 3 mL  3 mL Nebulization BID Leeroy Bock, MD   3 mL at 12/31/23 0724   levalbuterol (XOPENEX) nebulizer solution 1.25 mg  1.25 mg Nebulization Q4H PRN Antonieta Iba, MD       lidocaine-EPINEPHrine (XYLOCAINE W/EPI) 1 %-1:100000 (with pres) injection 20 mL  20 mL Intradermal Once Lynden Oxford R, PA-C       LORazepam (ATIVAN) injection 0.5 mg  0.5 mg Intravenous Q6H PRN Lorretta Harp, MD  0.5 mg at 12/25/23 0007   losartan (COZAAR) tablet 12.5 mg  12.5 mg Oral Daily Eula Listen M, PA-C   12.5 mg at 12/31/23 9562   melatonin tablet 5 mg  5 mg Oral QHS Leeroy Bock, MD   5 mg at 12/30/23 2053   metoprolol succinate (TOPROL-XL) 24 hr tablet 100 mg  100 mg Oral Daily Jamelle Rushing L, MD   100 mg at 12/31/23 0904   metoprolol tartrate (LOPRESSOR) injection 5 mg  5 mg Intravenous Q2H PRN Lorretta Harp, MD   5 mg at 12/25/23 2320   ondansetron [facility]) injection 4 mg  4 mg Intravenous Q8H PRN Lorretta Harp, MD   4 mg at 12/25/23 1641   Oral care mouth rinse  15 mL Mouth Rinse PRN Leeroy Bock, MD       simvastatin (ZOCOR) tablet 20 mg  20 mg Oral q1800 Lorretta Harp, MD   20 mg at 12/30/23 1708     Discharge Medications: Please see discharge summary for a list of discharge medications.  Relevant Imaging Results:  Relevant Lab Results:   Additional Information SS#: [id number]  Margarito Liner, LCSW

## 2023-12-31 NOTE — Progress Notes (Signed)
 Occupational Therapy Treatment Patient Details Name: Laura Bell MRN: [id number] DOB: Jan 30, 1936 Today's Date: 12/31/2023   History of present illness Pt is an 88 year old female who presents with SOB, work up included Influenza A, RSV with acute hypoxic respiratory failure, afib with RVR, AKI, hypokalemia and hypophosphatemia. PMH significant for HTN, HLD, sCHF with EF 40-45%, depression, anemia, breast cancer, and uterus cancer.   OT comments  Pt is supine in bed on arrival. Easily arousable and agreeable to OT session. She denies pain. Pt performed bed mobility with CGA to reach EOB, Min A for BLE management to return to supine. Able to scoot to [facility] with bed in trendelenburg position and bed rail use with SUP and verb cues. Pt required CGA for STS from EOB to RW and for in room mobility to toilet. Mod A for toilet transfer d/t low height. UB bathing set up assist seated on toilet, CGA for UB dressing to maneuver gown around tele. Max A for LB bathing and dressing tasks seated on toilet and in standing with pt using RW to steady self. She fatigues very easily and HR up to 115 during session. Ambulated back to bed and left with all needs in place. She remains very weak and a high fall risk and will cont to require skilled acute OT services to maximize his safety and IND to return to PLOF.       If plan is discharge home, recommend the following:  A lot of help with bathing/dressing/bathroom;Assistance with cooking/housework;Help with stairs or ramp for entrance;Supervision due to cognitive status;A little help with walking and/or transfers   Equipment Recommendations  Other (comment) (defer)    Recommendations for Other Services      Precautions / Restrictions Precautions Precautions: Fall Recall of Precautions/Restrictions: Impaired Restrictions Weight Bearing Restrictions Per Provider Order: No       Mobility Bed Mobility Overal bed mobility: Needs Assistance Bed Mobility:  Supine to Sit, Sit to Supine     Supine to sit: Contact guard, HOB elevated Sit to supine: Min assist   General bed mobility comments: able to reach EOB without physical assist with HOB elevated, Min A for BLE management to return to supine    Transfers Overall transfer level: Needs assistance Equipment used: Rolling walker (2 wheels) Transfers: Sit to/from Stand Sit to Stand: Contact guard assist, Mod assist           General transfer comment: CGA from EOB to RW, more assist from toilet d/t low height     Balance Overall balance assessment: Needs assistance Sitting-balance support: Feet supported Sitting balance-Leahy Scale: Good     Standing balance support: Bilateral upper extremity supported, During functional activity, Reliant on assistive device for balance Standing balance-Leahy Scale: Fair Standing balance comment: RW use                           ADL either performed or assessed with clinical judgement   ADL Overall ADL's : Needs assistance/impaired         Upper Body Bathing: Supervision/ safety;Sitting Upper Body Bathing Details (indicate cue type and reason): on toilet Lower Body Bathing: Maximal assistance;Sitting/lateral leans;Sit to/from stand Lower Body Bathing Details (indicate cue type and reason): on toilet/from toilet Upper Body Dressing : Contact guard assist;Sitting Upper Body Dressing Details (indicate cue type and reason): to don gown Lower Body Dressing: Maximal assistance;Sitting/lateral leans;Sit to/from stand Lower Body Dressing Details (indicate cue type and reason):  don socks and pull up Toilet Transfer: Minimal assistance;Moderate assistance;Grab bars;Regular Toilet;Rolling walker (2 wheels)                  Extremity/Trunk Assessment              Vision       Perception     Praxis     Communication Communication Communication: No apparent difficulties   Cognition Arousal: Alert Behavior During  Therapy: WFL for tasks assessed/performed                                 Following commands: Impaired Following commands impaired: Follows one step commands with increased time      Cueing   Cueing Techniques: Verbal cues, Tactile cues, Visual cues  Exercises      Shoulder Instructions       General Comments pt fatigues easily with activity, HR up to 115 at most with activity during session; tele battery changed during session    Pertinent Vitals/ Pain       Pain Assessment Pain Assessment: No/denies pain  Home Living                                          Prior Functioning/Environment              Frequency  Min 2X/week        Progress Toward Goals  OT Goals(current goals can now be found in the care plan section)  Progress towards OT goals: Progressing toward goals  Acute Rehab OT Goals Patient Stated Goal: improve function OT Goal Formulation: With patient Time For Goal Achievement: 01/09/24 Potential to Achieve Goals: Good  Plan      Co-evaluation                 AM-PAC OT "6 Clicks" Daily Activity     Outcome Measure   Help from another person eating meals?: None Help from another person taking care of personal grooming?: A Little Help from another person toileting, which includes using toliet, bedpan, or urinal?: A Lot Help from another person bathing (including washing, rinsing, drying)?: A Lot Help from another person to put on and taking off regular upper body clothing?: A Little Help from another person to put on and taking off regular lower body clothing?: A Lot 6 Click Score: 16    End of Session Equipment Utilized During Treatment: Rolling walker (2 wheels)  OT Visit Diagnosis: Other abnormalities of gait and mobility (R26.89);Muscle weakness (generalized) (M62.81)   Activity Tolerance Patient tolerated treatment well;Patient limited by fatigue   Patient Left in bed;with call bell/phone  within reach;with bed alarm set   Nurse Communication Mobility status        Time: 1610-9604 OT Time Calculation (min): 29 min  Charges: OT General Charges $OT Visit: 1 Visit OT Treatments $Self Care/Home Management : 23-37 mins  Marypat Kimmet, OTR/L  12/31/23, 3:31 PM   Ector Laurel E Renay Crammer 12/31/2023, 3:29 PM

## 2024-01-01 DIAGNOSIS — J101 Influenza due to other identified influenza virus with other respiratory manifestations: Secondary | ICD-10-CM | POA: Diagnosis not present

## 2024-01-01 DIAGNOSIS — I4891 Unspecified atrial fibrillation: Secondary | ICD-10-CM | POA: Diagnosis not present

## 2024-01-01 LAB — BASIC METABOLIC PANEL WITH GFR
Anion gap: 9 (ref 5–15)
BUN: 14 mg/dL (ref 8–23)
CO2: 26 mmol/L (ref 22–32)
Calcium: 8.6 mg/dL — ABNORMAL LOW (ref 8.9–10.3)
Chloride: 96 mmol/L — ABNORMAL LOW (ref 98–111)
Creatinine, Ser: 0.97 mg/dL (ref 0.44–1.00)
GFR, Estimated: 57 mL/min — ABNORMAL LOW (ref >60)
Glucose, Bld: 230 mg/dL — ABNORMAL HIGH (ref 70–99)
Potassium: 3.5 mmol/L (ref 3.5–5.1)
Sodium: 131 mmol/L — ABNORMAL LOW (ref 135–145)

## 2024-01-01 LAB — LIPID PANEL
Cholesterol: 120 mg/dL (ref 0–200)
HDL: 43 mg/dL (ref >40)
LDL Cholesterol: 62 mg/dL (ref 0–99)
Total CHOL/HDL Ratio: 2.8 ratio
Triglycerides: 73 mg/dL (ref <150)
VLDL: 15 mg/dL (ref 0–40)

## 2024-01-01 LAB — GLUCOSE, CAPILLARY
Glucose-Capillary: 203 mg/dL — ABNORMAL HIGH (ref 70–99)
Glucose-Capillary: 190 mg/dL — ABNORMAL HIGH (ref 70–99)

## 2024-01-01 MED ORDER — LOSARTAN POTASSIUM 25 MG PO TABS: 12.5000 mg | ORAL_TABLET | Freq: Every day | ORAL | 0 refills | Status: DC

## 2024-01-01 MED ORDER — APIXABAN 5 MG PO TABS: 5.0000 mg | ORAL_TABLET | Freq: Two times a day (BID) | ORAL | 0 refills | Status: AC

## 2024-01-01 MED ORDER — AMIODARONE HCL 200 MG PO TABS
200.0000 mg | ORAL_TABLET | Freq: Every day | ORAL | 0 refills | Status: DC
Start: 2024-01-01 — End: 2024-01-07

## 2024-01-01 MED ORDER — AMIODARONE HCL 200 MG PO TABS: 200.0000 mg | ORAL_TABLET | Freq: Two times a day (BID) | ORAL | 0 refills | Status: DC

## 2024-01-01 MED ORDER — CEPHALEXIN 500 MG PO CAPS: 500.0000 mg | ORAL_CAPSULE | Freq: Three times a day (TID) | ORAL | 0 refills | Status: AC

## 2024-01-01 MED ORDER — METOPROLOL SUCCINATE ER 100 MG PO TB24: 100.0000 mg | ORAL_TABLET | Freq: Every day | ORAL | 0 refills | Status: AC

## 2024-01-01 NOTE — Inpatient Diabetes Management (Addendum)
 Inpatient Diabetes Program Recommendations  AACE/ADA: New Consensus Statement on Inpatient Glycemic Control   Target Ranges:  Prepandial:   less than 140 mg/dL      Peak postprandial:   less than 180 mg/dL (1-2 hours)      Critically ill patients:  140 - 180 mg/dL    Latest Reference Range & Units 12/31/23 07:56 12/31/23 11:20 12/31/23 15:29 01/01/24 07:46 01/01/24 11:33  Glucose-Capillary 70 - 99 mg/dL 130 (H) 865 (H) 784 (H) 203 (H) 190 (H)   Review of Glycemic Control  Diabetes history: DM2 Outpatient Diabetes medications: Lantus 5 units at bedtime, Humalog 0-5 units BID, Januvia 50 mg daily, Metformin 500 mg BID Current orders for Inpatient glycemic control: Semglee 15 units daily, Novolog 0-9 units TID with meals  Inpatient Diabetes Program Recommendations:    Insulin: Please consider increasing Semglee to 17 units daily.   Thanks, Orlando Penner, RN, MSN, CDCES Diabetes Coordinator Inpatient Diabetes Program [phone] (Team Pager from 8am to 5pm)

## 2024-01-01 NOTE — TOC Transition Note (Addendum)
 Transition of Care [facility]) - Discharge Note   Patient Details  Name: Laura Bell MRN: [id number] Date of Birth: 04-16-1936  Transition of Care [facility]) CM/SW Contact:  Truddie Hidden, RN Phone Number: 01/01/2024, 3:26 PM   Clinical Narrative:     Per facility patient admission confirmed for today. Spoke with [name] in admissions. Patient assigned room #  100-B @ [facility] Report will be called to [phone] Face sheet and medical necessity forms printed to the floor to be added to the  Discharge sent in summary HUB.  Nurse, and family notified spoke with her daughter, [name].  Facility will transport patient at approximately 4:30    TOC signing off.          Patient Goals and CMS Choice            Discharge Placement                       Discharge Plan and Services Additional resources added to the After Visit Summary for                                       Social Drivers of Health (SDOH) Interventions SDOH Screenings   Food Insecurity: Patient Declined (12/25/2023)  Housing: Patient Declined (12/25/2023)  Transportation Needs: Patient Declined (12/25/2023)  Utilities: Not At Risk (12/25/2023)  Depression (PHQ2-9): Low Risk  (02/02/2020)  Financial Resource Strain: Low Risk  (05/18/2019)  Physical Activity: Inactive (05/18/2019)  Stress: Stress Concern Present (05/18/2019)  Tobacco Use: Medium Risk (12/23/2023)     Readmission Risk Interventions     No data to display

## 2024-01-01 NOTE — Progress Notes (Signed)
 Physical Therapy Treatment Patient Details Name: Laura Bell MRN: [id number] DOB: 1936/01/17 Today's Date: 01/01/2024   History of Present Illness Pt is an 88 year old female who presents with SOB, work up included Influenza A, RSV with acute hypoxic respiratory failure, afib with RVR, AKI, hypokalemia and hypophosphatemia. PMH significant for HTN, HLD, sCHF with EF 40-45%, depression, anemia, breast cancer, and uterus cancer.    PT Comments  Pt awake and in good spirits this morning, again makes known that she is an active person and ready to get moving and out of this bed. No assist needed to EOB and no physical assist needed for transfers with RW. Pt able to show improved AMB distance tolerance today (~61ft previous day) today AMB 50ft twice. VSS with mobility. PT assisted to recliner at end of session. Nursing asked to assist pt with need to void.    If plan is discharge home, recommend the following: A little help with walking and/or transfers;A little help with bathing/dressing/bathroom;Assist for transportation;Help with stairs or ramp for entrance;Assistance with cooking/housework   Can travel by private vehicle     No  Equipment Recommendations  None recommended by PT    Recommendations for Other Services       Precautions / Restrictions Precautions Precautions: Fall Recall of Precautions/Restrictions: Impaired Precaution/Restrictions Comments: watch HR     Mobility  Bed Mobility Overal bed mobility: Needs Assistance Bed Mobility: Supine to Sit     Supine to sit: HOB elevated, Supervision     General bed mobility comments: a little better each day    Transfers Overall transfer level: Needs assistance Equipment used: Rolling walker (2 wheels) Transfers: Sit to/from Stand Sit to Stand: Contact guard assist           General transfer comment: twice from EOB in session    Ambulation/Gait Ambulation/Gait assistance: Contact guard assist Gait Distance  (Feet): 20 Feet (twice in session) Assistive device: Rolling walker (2 wheels)         General Gait Details: quite slow, but moving more safely with RW today. AMB to window and back twice with a break between.   Stairs             Wheelchair Mobility     Tilt Bed    Modified Rankin (Stroke Patients Only)       Balance                                            Communication    Cognition Arousal: Alert Behavior During Therapy: WFL for tasks assessed/performed   PT - Cognitive impairments: History of cognitive impairments                           Following commands impaired: Follows one step commands with increased time    Cueing    Exercises      General Comments        Pertinent Vitals/Pain Pain Assessment Pain Assessment: No/denies pain    Home Living                          Prior Function            PT Goals (current goals can now be found in the care plan section) Acute Rehab PT Goals PT Goal  Formulation: Patient unable to participate in goal setting    Frequency    Min 2X/week      PT Plan      Co-evaluation              AM-PAC PT "6 Clicks" Mobility   Outcome Measure  Help needed turning from your back to your side while in a flat bed without using bedrails?: A Little Help needed moving from lying on your back to sitting on the side of a flat bed without using bedrails?: A Little Help needed moving to and from a bed to a chair (including a wheelchair)?: A Little Help needed standing up from a chair using your arms (e.g., wheelchair or bedside chair)?: A Little Help needed to walk in hospital room?: A Little Help needed climbing 3-5 steps with a railing? : A Lot 6 Click Score: 17    End of Session Equipment Utilized During Treatment: Gait belt Activity Tolerance: Patient tolerated treatment well;No increased pain Patient left: with call bell/phone within reach;in chair;with  chair alarm set Nurse Communication: Mobility status (needs help with cleanup) PT Visit Diagnosis: Muscle weakness (generalized) (M62.81);Unsteadiness on feet (R26.81)     Time: 1610-9604 PT Time Calculation (min) (ACUTE ONLY): 19 min  Charges:    $Therapeutic Activity: 8-22 mins PT General Charges $$ ACUTE PT VISIT: 1 Visit                    12:19 PM, 01/01/24 [name], PT, DPT Physical Therapist - [facility]  [phone] (ASCOM)    Magie Ciampa C 01/01/2024, 12:17 PM

## 2024-01-01 NOTE — Progress Notes (Signed)
 Pt called RN to room. Pt is adamant about going home and is refusing to wear cardiac monitoring as ordered. Have notified CCMD as well as MD J. Williams.

## 2024-01-01 NOTE — Progress Notes (Signed)
   Patient Name: Laura Bell Date of Encounter: 01/01/2024 [facility] Cardiologist: Julien Nordmann, MD   Interval Summary  .    Patient reports she is overall feeling well. No chest pain or SOB reported. She remains in NSR.   Vital Signs .    Vitals:   12/31/23 1944 12/31/23 2315 01/01/24 0418 01/01/24 0459  BP: 127/76 138/72 (!) 123/53   Pulse: 90 95 88   Resp: 18 20 18    Temp: 97.9 F (36.6 C) 98.1 F (36.7 C) 97.7 F (36.5 C)   TempSrc:      SpO2: 97% 98% 92%   Weight:    69.8 kg  Height:        Intake/Output Summary (Last 24 hours) at 01/01/2024 0938 Last data filed at 12/31/2023 1030 Gross per 24 hour  Intake 120 ml  Output --  Net 120 ml      01/01/2024    4:59 AM 12/31/2023    5:18 AM 12/30/2023    5:04 AM  Last 3 Weights  Weight (lbs) 153 lb 14.1 oz 152 lb 1.6 oz 152 lb 3.2 oz  Weight (kg) 69.8 kg 68.992 kg 69.037 kg      Telemetry/ECG    NSR HR 80-90s - Personally Reviewed  Physical Exam .   GEN: No acute distress.   Neck: No JVD Cardiac: RRR, no murmurs, rubs, or gallops.  Respiratory: mild wheezing, decreased breath sounds GI: Soft, nontender, non-distended  MS: No edema  Assessment & Plan .    New onset Afib with RVR - in the setting of acute respiratory illness (RSV and Influenza) - presented in NSR, went into afib morning of 3/27 - she is currently in NSR - continue rate control with Toprol 100mg  daily - amiodarone 200mg  BID x 7 days, 200mg  daily thereafter - CHADSVASC of 7 - continue Eliquis 5mg  BID for stroke ppx  Acute on chronic HFrEF - Echo in 2020 showed LVEF 40-45% - Echo this admission showed LVEF 20-25% - in the setting of Afib RVR and other acute illness - mildly volume up given IV lasix 20mg  once. Today, she appears euvolemic - Toprol 100mg  daily - Losartan 12.5mg  daily - plan to re-check an echo in 6-8 weeks. IF EF is low would benefit from an ischemic evaluation  Microcytic anemia - Hgb 11-12  AKI -  resolved  HTN - BP good - Losartan and Toprol   For questions or updates, please contact  [facility] Please consult [email or web address] for contact info under        Signed, Lilyahna Sirmon David Stall, PA-C

## 2024-01-01 NOTE — TOC Progression Note (Signed)
 Transition of Care [facility]) - Progression Note    Patient Details  Name: Laura Bell MRN: [id number] Date of Birth: November 10, 1935  [facility]) CM/SW Contact  Truddie Hidden, RN Phone Number: 01/01/2024, 12:19 PM  Clinical Narrative:    Attempt to access patient in [employer] to start auth. Unable to access. RNCM contacted [employer]. Spoke with representative, Tresa Endo. Plan is managed directly by [facility].   Spoke with [name] in admissions from [place]. She will start auth at the facility once patient is back. Per [name] plan is managed by [facility]. Patient can return to facility today. If patient's daughter is unable to transport the facility will transport her.   Attempt to contact patient's daughter, [name]. No answer. Left a message requesting return call.          Expected Discharge Plan and Services                                               Social Determinants of Health (SDOH) Interventions SDOH Screenings   Food Insecurity: Patient Declined (12/25/2023)  Housing: Patient Declined (12/25/2023)  Transportation Needs: Patient Declined (12/25/2023)  Utilities: Not At Risk (12/25/2023)  Depression (PHQ2-9): Low Risk  (02/02/2020)  Financial Resource Strain: Low Risk  (05/18/2019)  Physical Activity: Inactive (05/18/2019)  Stress: Stress Concern Present (05/18/2019)  Tobacco Use: Medium Risk (12/23/2023)    Readmission Risk Interventions     No data to display

## 2024-01-01 NOTE — Plan of Care (Signed)

## 2024-01-01 NOTE — Discharge Summary (Addendum)
 Physician Discharge Summary  Laura Bell MVH:[id number] DOB: [birth year] DOA: 12/23/2023  PCP: Judy Pimple, MD  Admit date: 12/23/2023 Discharge date: 01/01/2024  Admitted From: home facility  Disposition:  home facility & insurance auth will have to be start there for pt to go SNF   Recommendations for Outpatient Follow-up:  Follow up with PCP in 1-2 weeks F/u w/ cardio, Dr. Mariah Milling, in 1-2 weeks  Home Health: Equipment/Devices:  Discharge Condition: stable  CODE STATUS:full  Diet recommendation: Heart Healthy / Carb Modified   Brief/Interim Summary: HPI was taken from Dr. Clyde Lundborg: Laura Bell is a 88 y.o. female with medical history significant of HTN, HLD, sCHF with EF 40-45%, depression, anemia, breast cancer, uterus cancer, who presents with SOB.   Per patient and her daughter at the bedside, patient has been sick for almost 2 weeks.  Patient has dry cough, shortness of breath which has been progressively worsening.  No chest pain, fever or chills.  Patient has malaise, poor appetite and decreased oral intake.  Patient was tested negative for COVID in facility.  No nausea, vomiting, diarrhea or abdominal pain.  No symptoms of UTI.   Data reviewed independently and ED Course: pt was found to have positive PCR for flu A and RSV, WBC 7.8, potassium 3.4, mild AKI with creatinine 0.95, BUN 10 and GFR 59 (baseline creatinine 0.58 on 08/02/2020), lactic acid 2.8, temperature normal, blood pressure 121/72, heart rate 128, RR 18, oxygen saturation 92% on room air, which improved to 99% on 2 L oxygen.  Chest x-ray negative.  Patient is placed on telemetry bed for observation.  Discharge Diagnoses:  Principal Problem:   Influenza A Active Problems:   RSV (respiratory syncytial virus pneumonia)   Elevated lactic acid level   Acute on chronic HFrEF (heart failure with reduced ejection fraction) (HCC)   Hyperlipidemia associated with type 2 diabetes mellitus (HCC)   Diabetes mellitus  without complication (HCC)   Essential hypertension   AKI (acute kidney injury) (HCC)   Depression   Hypokalemia   Hypophosphatemia   Overweight (BMI 25.0-29.9)   Hypoxia   RSV (acute bronchiolitis due to respiratory syncytial virus)   SOB (shortness of breath)   New onset atrial fibrillation (HCC)   Sepsis (HCC)   Abscess of antecubital fossa  Acute hypoxic respiratory failure: likely secondary to influenza & RSV. Continue on bronchodilators & encourage incentive spirometry. Completed tamiflu. Resolved     A. fib: w/ RVR. New onset. Continue converted to sinus rhythm after started on amiodarone and digoxin. Continue on amidoarone, metoprolol, eliquis. Midodrine was d/c by cardio. Will need to f/u outpatient w/ cardio   AKI: resolved   Thrombophlebitis of RUE at site of previous IV.US showing phlebitis. Continue on keflex x 3 days more. Continue w/ wound care   Microcytic anemia: no need for a transfusion currently   Chronic systolic CHF: echo shows EF down to 20-25%. CHF appears compensated. S/p IV lasix as per cardio. Monitor I/Os   HLD: continue on statin   DM2:HbA1c 11.7, poorly controlled. Continue on glargine, SSI w/ accuchecks    HTN: continue on metoprolol, losartan. D/c lisinopril & HCTZ  Depression: severity unknown. Continue on home dose of citalopram   Hypokalemia: WNL today  Overweight: BMI 27.8. Would benefit from weight loss    Discharge Instructions  Discharge Instructions     Diet - low sodium heart healthy   Complete by: As directed    Diet Carb Modified   Complete  by: As directed    Discharge instructions   Complete by: As directed    F/u w/ PCP in 1-2 weeks. F/u w/ cardio, Dr. Mariah Milling, in 1-2 weeks.   Increase activity slowly   Complete by: As directed    No wound care   Complete by: As directed       Allergies as of 01/01/2024       Reactions   Actos [pioglitazone]    Fatigue/pedal edema/exercise intol and palpitations   Alendronate  Sodium    GI side eff   Boniva [ibandronate Sodium]    GI side eff   Ibandronate Hives   GI side eff   Lansoprazole    Omeprazole Itching   ? Itching         Medication List     STOP taking these medications    aspirin EC 81 MG tablet   cyanocobalamin 1000 MCG tablet   glipiZIDE 10 MG tablet Commonly known as: GLUCOTROL   hydrochlorothiazide 12.5 MG capsule Commonly known as: MICROZIDE   lisinopril 40 MG tablet Commonly known as: ZESTRIL   Premarin vaginal cream Generic drug: conjugated estrogens       TAKE these medications    Accu-Chek Softclix Lancets lancets USE 1  TO CHECK GLUCOSE TWICE DAILY AND  AS  NEEDED   acetaminophen 325 MG tablet Commonly known as: TYLENOL Take 650 mg by mouth every 8 (eight) hours.   Acidophilus Probiotic Blend Caps Take 1 capsule by mouth daily.   amiodarone 200 MG tablet Commonly known as: PACERONE Take 1 tablet (200 mg total) by mouth 2 (two) times daily for 2 days.   amiodarone 200 MG tablet Commonly known as: PACERONE Take 1 tablet (200 mg total) by mouth daily. Start this script after completing amiodarone 200 mg BID x 2 days.   apixaban 5 MG Tabs tablet Commonly known as: ELIQUIS Take 1 tablet (5 mg total) by mouth 2 (two) times daily.   cephALEXin 500 MG capsule Commonly known as: KEFLEX Take 1 capsule (500 mg total) by mouth every 8 (eight) hours for 3 days.   citalopram 20 MG tablet Commonly known as: CELEXA Take 1 tablet by mouth in the evening What changed:  when to take this additional instructions Another medication with the same name was removed. Continue taking this medication, and follow the directions you see here.   famotidine 40 MG tablet Commonly known as: Pepcid Take 1 tablet (40 mg total) by mouth daily. For acid reflux   fluticasone-salmeterol 250-50 MCG/ACT Aepb Commonly known as: ADVAIR Inhale 1 puff into the lungs every 12 (twelve) hours. Rinse mouth after use.   GoodSense  Antacid 750 MG chewable tablet Generic drug: calcium carbonate Chew 1 tablet by mouth every 8 (eight) hours as needed for heartburn.   GoodSense Arthritis Pain 1 % Gel Generic drug: diclofenac Sodium Apply 2 g topically 3 (three) times daily. (To bilateral shoulders/hips/knees)   insulin lispro 100 UNIT/ML KwikPen Commonly known as: HUMALOG Inject 0-5 Units into the skin 2 (two) times daily before a meal. (Sliding Scale:0-200=0, 201-249=2, 250-299=3, 300-349=4, 350-399=5, if>400 call MD)   ipratropium-albuterol 0.5-2.5 (3) MG/3ML Soln Commonly known as: DUONEB Take 3 mLs by nebulization every 6 (six) hours.   Januvia 50 MG tablet Generic drug: sitaGLIPtin Take 50 mg by mouth daily.   Lantus SoloStar 100 UNIT/ML Solostar Pen Generic drug: insulin glargine Inject 5 Units into the skin at bedtime.   liver oil-zinc oxide 40 % ointment Commonly known  as: DESITIN Apply 1 Application topically 2 (two) times daily.   losartan 25 MG tablet Commonly known as: COZAAR Take 0.5 tablets (12.5 mg total) by mouth daily. Start taking on: January 02, 2024   magnesium oxide 400 (240 Mg) MG tablet Commonly known as: MAG-OX Take 1 tablet by mouth 2 (two) times daily.   metFORMIN 500 MG tablet Commonly known as: GLUCOPHAGE Take 500 mg by mouth 2 (two) times daily. What changed: Another medication with the same name was removed. Continue taking this medication, and follow the directions you see here.   metoprolol succinate 100 MG 24 hr tablet Commonly known as: TOPROL-XL Take 1 tablet (100 mg total) by mouth daily. Take with or immediately following a meal. Start taking on: January 02, 2024 What changed:  medication strength See the new instructions. Another medication with the same name was removed. Continue taking this medication, and follow the directions you see here.   mirtazapine 45 MG tablet Commonly known as: REMERON Take 22.5 mg by mouth at bedtime.   OneTouch Verio test  strip Generic drug: glucose blood USE 1 STRIP TO CHECK GLUCOSE TWICE DAILY (DX.  E11.65)   OneTouch Verio w/Device Kit 1 Device by Other route 2 (two) times daily. **ONETOUCH VERIO** Use to check blood sugar twice daily for DM (dx. E11.65)   polyethylene glycol 17 g packet Commonly known as: MIRALAX / GLYCOLAX Take 17 g by mouth 2 (two) times daily. Mix with 4-8 oz of liquid of choice   senna 8.6 MG Tabs tablet Commonly known as: SENOKOT Take 2 tablets by mouth in the morning and at bedtime.   simvastatin 10 MG tablet Commonly known as: ZOCOR Take 10 mg by mouth at bedtime. What changed: Another medication with the same name was removed. Continue taking this medication, and follow the directions you see here.   traMADol 50 MG tablet Commonly known as: ULTRAM Take 50 mg by mouth every 6 (six) hours as needed.   Vitamin D 50 MCG (2000 UT) Caps Take 2,000 Units by mouth daily.        Contact information for follow-up providers     Gollan, Tollie Pizza, MD Follow up.   Specialty: Cardiology Why: F/u in 1-2 weeks Contact information: [address] [place] [address] [phone]              Contact information for after-discharge care     Destination     [facility] .   Service: Skilled Nursing Contact information: [address] [place] [address] [phone]                    Allergies  Allergen Reactions   Actos [Pioglitazone]     Fatigue/pedal edema/exercise intol and palpitations   Alendronate Sodium     GI side eff   Boniva [Ibandronate Sodium]     GI side eff   Ibandronate Hives    GI side eff   Lansoprazole    Omeprazole Itching    ? Itching     Consultations: Cardio    Procedures/Studies: [place] RT UPPER EXTREM LTD SOFT TISSUE NON VASCULAR Result Date: 12/28/2023 CLINICAL DATA:  Swelling in the right antecubital fossa with cellulitis and possible abscess at site of previous IV.  EXAM: ULTRASOUND RIGHT UPPER EXTREMITY LIMITED TECHNIQUE: Ultrasound examination of the upper extremity soft tissues was performed in the area of clinical concern. COMPARISON:  None Available. FINDINGS: Examination is targeted to the  right antecubital fossa. There is an ill-defined subcutaneous collection which appears partially exophytic in the area of concern. This measures approximately 1.9 x 0.9 x 1.8 cm and may reflect a phlegmon or infiltrated fluid. No organized fluid collection identified. No significant internal vascularity. Thrombosis of the adjacent cephalic vein is noted consistent with superficial thrombophlebitis. IMPRESSION: 1. Ill-defined subcutaneous collection in the right antecubital fossa may reflect a phlegmon or infiltrated fluid. No organized fluid collection identified. 2. Superficial thrombophlebitis of the adjacent cephalic vein. Electronically Signed   By: Carey Bullocks M.D.   On: 12/28/2023 15:02   ECHOCARDIOGRAM COMPLETE Result Date: 12/26/2023    ECHOCARDIOGRAM REPORT   Patient Name:   Laura Bell Date of Exam: 12/26/2023 Medical Rec #:  [id number]          Height:       62.0 in Accession #:    [id number]         Weight:       156.1 lb Date of Birth:  March 30, 1936          BSA:          1.721 m Patient Age:    87 years           BP:           101/61 mmHg Patient Gender: F                  HR:           110 bpm. Exam Location:  [facility] Procedure: 2D Echo, 3D Echo, Cardiac Doppler, Color Doppler and Strain Analysis            (Both Spectral and Color Flow Doppler were utilized during            procedure). Indications:     Atrial Fibrillation  History:         Patient has prior history of Echocardiogram examinations, most                  recent 05/31/2019. Cardiomyopathy and CHF, Arrythmias:Atrial                  Fibrillation, Signs/Symptoms:Fatigue and Shortness of Breath;                  Risk Factors:Hypertension, Diabetes and Dyslipidemia. Influenza                  +.   Sonographer:     Mikki Harbor Referring Phys:  [id number] SHERI HAMMOCK Diagnosing Phys: Debbe Odea MD  Sonographer Comments: Global longitudinal strain was attempted. IMPRESSIONS  1. Left ventricular ejection fraction, by estimation, is 20 to 25%. Left ventricular ejection fraction by PLAX is 24 %. The left ventricle has severely decreased function. The left ventricle demonstrates global hypokinesis. Left ventricular diastolic parameters are consistent with Grade II diastolic dysfunction (pseudonormalization).  2. Right ventricular systolic function is normal. The right ventricular size is normal. There is normal pulmonary artery systolic pressure.  3. The mitral valve is normal in structure. Mild mitral valve regurgitation.  4. The aortic valve is tricuspid. Aortic valve regurgitation is mild.  5. The inferior vena cava is normal in size with greater than 50% respiratory variability, suggesting right atrial pressure of 3 mmHg. FINDINGS  Left Ventricle: Left ventricular ejection fraction, by estimation, is 20 to 25%. Left ventricular ejection fraction by PLAX is 24 %. The left ventricle has severely decreased function. The left ventricle demonstrates global hypokinesis.  Global longitudinal strain performed but not reported based on interpreter judgement due to suboptimal tracking. The left ventricular internal cavity size was normal in size. There is no left ventricular hypertrophy. Left ventricular diastolic parameters are consistent with Grade II diastolic dysfunction (pseudonormalization). Right Ventricle: The right ventricular size is normal. No increase in right ventricular wall thickness. Right ventricular systolic function is normal. There is normal pulmonary artery systolic pressure. The tricuspid regurgitant velocity is 2.50 m/s, and  with an assumed right atrial pressure of 3 mmHg, the estimated right ventricular systolic pressure is 28.0 mmHg. Left Atrium: Left atrial size was normal in size.  Right Atrium: Right atrial size was normal in size. Pericardium: There is no evidence of pericardial effusion. Mitral Valve: The mitral valve is normal in structure. Mild mitral valve regurgitation. MV peak gradient, 3.6 mmHg. The mean mitral valve gradient is 2.0 mmHg. Tricuspid Valve: The tricuspid valve is normal in structure. Tricuspid valve regurgitation is mild. Aortic Valve: The aortic valve is tricuspid. Aortic valve regurgitation is mild. Aortic valve mean gradient measures 4.0 mmHg. Aortic valve peak gradient measures 6.7 mmHg. Aortic valve area, by VTI measures 1.90 cm. Pulmonic Valve: The pulmonic valve was normal in structure. Pulmonic valve regurgitation is not visualized. Aorta: The aortic root is normal in size and structure. Venous: The inferior vena cava is normal in size with greater than 50% respiratory variability, suggesting right atrial pressure of 3 mmHg. IAS/Shunts: No atrial level shunt detected by color flow Doppler. Additional Comments: 3D was performed not requiring image post processing on an independent workstation and was indeterminate.  LEFT VENTRICLE PLAX 2D LV EF:         Left            Diastology                ventricular     LV e' medial:    6.96 cm/s                ejection        LV E/e' medial:  14.7                fraction by     LV e' lateral:   7.18 cm/s                PLAX is 24      LV E/e' lateral: 14.2                %. LVIDd:         4.50 cm LVIDs:         4.00 cm LV PW:         1.10 cm LV IVS:        1.00 cm LVOT diam:     2.10 cm LV SV:         43 LV SV Index:   25 LVOT Area:     3.46 cm  LV Volumes (MOD) LV vol d, MOD    69.8 ml A2C: LV vol d, MOD    73.0 ml A4C: LV vol s, MOD    49.3 ml A2C: LV vol s, MOD    52.5 ml A4C: LV SV MOD A2C:   20.5 ml LV SV MOD A4C:   73.0 ml LV SV MOD BP:    20.3 ml RIGHT VENTRICLE RV Basal diam:  3.30 cm RV Mid diam:    3.30 cm RV S prime:     12.40 cm/s  TAPSE (M-mode): 2.0 cm LEFT ATRIUM             Index        RIGHT ATRIUM            Index LA diam:        3.70 cm 2.15 cm/m   RA Area:     17.10 cm LA Vol (A2C):   47.6 ml 27.67 ml/m  RA Volume:   47.90 ml  27.84 ml/m LA Vol (A4C):   38.5 ml 22.38 ml/m LA Biplane Vol: 43.1 ml 25.05 ml/m  AORTIC VALVE                    PULMONIC VALVE AV Area (Vmax):    1.87 cm     PV Vmax:       0.81 m/s AV Area (Vmean):   1.71 cm     PV Peak grad:  2.6 mmHg AV Area (VTI):     1.90 cm AV Vmax:           129.00 cm/s AV Vmean:          91.500 cm/s AV VTI:            0.224 m AV Peak Grad:      6.7 mmHg AV Mean Grad:      4.0 mmHg LVOT Vmax:         69.50 cm/s LVOT Vmean:        45.200 cm/s LVOT VTI:          0.123 m LVOT/AV VTI ratio: 0.55  AORTA Ao Root diam: 3.00 cm Ao Asc diam:  3.50 cm MITRAL VALVE                TRICUSPID VALVE MV Area (PHT): 6.60 cm     TR Peak grad:   25.0 mmHg MV Area VTI:   1.97 cm     TR Vmax:        250.00 cm/s MV Peak grad:  3.6 mmHg MV Mean grad:  2.0 mmHg     SHUNTS MV Vmax:       0.95 m/s     Systemic VTI:  0.12 m MV Vmean:      59.7 cm/s    Systemic Diam: 2.10 cm MV Decel Time: 115 msec MV E velocity: 102.00 cm/s MV A velocity: 78.00 cm/s MV E/A ratio:  1.31 Debbe Odea MD Electronically signed by Debbe Odea MD Signature Date/Time: 12/26/2023/12:49:04 PM    Final    CT Angio Chest Pulmonary Embolism (PE) W or WO Contrast Result Date: 12/23/2023 CLINICAL DATA:  Shortness of breath for a few days, initial encounter EXAM: CT ANGIOGRAPHY CHEST WITH CONTRAST TECHNIQUE: Multidetector CT imaging of the chest was performed using the standard protocol during bolus administration of intravenous contrast. Multiplanar CT image reconstructions and MIPs were obtained to evaluate the vascular anatomy. RADIATION DOSE REDUCTION: This exam was performed according to the departmental dose-optimization program which includes automated exposure control, adjustment of the mA and/or kV according to patient size and/or use of iterative reconstruction technique. CONTRAST:  75mL  OMNIPAQUE IOHEXOL 350 MG/ML SOLN COMPARISON:  10/04/2007 FINDINGS: Cardiovascular: Atherosclerotic calcifications of the thoracic aorta are noted without aneurysmal dilatation. The degree of opacification is limited precluding evaluation for dissection. The pulmonary artery shows a normal branching pattern bilaterally. No filling defect to suggest pulmonary embolism is noted. Coronary calcifications are seen. The heart is not significantly enlarged in size. Mediastinum/Nodes: Thoracic inlet is  within normal limits. No hilar or mediastinal adenopathy is noted. The esophagus as visualized is within normal limits. Lungs/Pleura: Lungs are well aerated bilaterally. No focal confluent infiltrate is seen. Mild emphysematous changes are noted. Some bronchial thickening is noted within the right lower lobe. No sizable parenchymal nodule is noted. Upper Abdomen: Visualized upper abdomen shows no acute abnormality. Musculoskeletal: No acute rib abnormality is noted. Review of the MIP images confirms the above findings. IMPRESSION: No evidence of pulmonary emboli. Mild bronchial thickening is noted particularly in the right lower lobe without focal infiltrate. Aortic Atherosclerosis (ICD10-I70.0) and Emphysema (ICD10-J43.9). Electronically Signed   By: Alcide Clever M.D.   On: 12/23/2023 21:48   DG Chest Port 1 View Result Date: 12/23/2023 CLINICAL DATA:  Shortness of breath. EXAM: PORTABLE CHEST 1 VIEW COMPARISON:  June 29, 2019. FINDINGS: Stable cardiomediastinal silhouette. Both lungs are clear. The visualized skeletal structures are unremarkable. IMPRESSION: No active disease. Electronically Signed   By: Lupita Raider M.D.   On: 12/23/2023 19:07   (Echo, Carotid, EGD, Colonoscopy, ERCP)    Subjective:   Discharge Exam: Vitals:   01/01/24 0418 01/01/24 0900  BP: (!) 123/53 128/79  Pulse: 88 90  Resp: 18   Temp: 97.7 F (36.5 C) 97.9 F (36.6 C)  SpO2: 92% 97%   Vitals:   12/31/23 2315 01/01/24  0418 01/01/24 0459 01/01/24 0900  BP: 138/72 (!) 123/53  128/79  Pulse: 95 88  90  Resp: 20 18    Temp: 98.1 F (36.7 C) 97.7 F (36.5 C)  97.9 F (36.6 C)  TempSrc:    Oral  SpO2: 98% 92%  97%  Weight:   69.8 kg   Height:        General: Pt is alert, awake, not in acute distress Cardiovascular: RRR, S1/S2 +, no rubs, no gallops Respiratory: CTA bilaterally, no wheezing, no rhonchi Abdominal: Soft, NT, ND, bowel sounds + Extremities: no edema, no cyanosis    The results of significant diagnostics from this hospitalization (including imaging, microbiology, ancillary and laboratory) are listed below for reference.     Microbiology: Recent Results (from the past 240 hours)  Culture, blood (Routine x 2)     Status: None   Collection Time: 12/23/23  6:36 PM   Specimen: BLOOD  Result Value Ref Range Status   Specimen Description BLOOD BLOOD RIGHT ARM  Final   Special Requests   Final    BOTTLES DRAWN AEROBIC AND ANAEROBIC Blood Culture results may not be optimal due to an inadequate volume of blood received in culture bottles   Culture   Final    NO GROWTH 5 DAYS Performed at [facility], [address], [place], [place] [address]    Report Status 12/28/2023 FINAL  Final  Culture, blood (Routine x 2)     Status: None   Collection Time: 12/23/23  6:38 PM   Specimen: BLOOD  Result Value Ref Range Status   Specimen Description BLOOD BLOOD RIGHT ARM  Final   Special Requests   Final    BOTTLES DRAWN AEROBIC AND ANAEROBIC Blood Culture adequate volume   Culture   Final    NO GROWTH 5 DAYS Performed at [facility], [address] [place], [place] [address]    Report Status 12/28/2023 FINAL  Final  Resp panel by RT-PCR (RSV, Flu A&B, Covid) Anterior Nasal Swab     Status: Abnormal   Collection Time: 12/23/23  6:47 PM   Specimen: Anterior  Nasal Swab  Result Value Ref Range Status   SARS Coronavirus 2 by RT PCR NEGATIVE NEGATIVE Final    Comment:  (NOTE) SARS-CoV-2 target nucleic acids are NOT DETECTED.  The SARS-CoV-2 RNA is generally detectable in upper respiratory specimens during the acute phase of infection. The lowest concentration of SARS-CoV-2 viral copies this assay can detect is 138 copies/mL. A negative result does not preclude SARS-Cov-2 infection and should not be used as the sole basis for treatment or other patient management decisions. A negative result may occur with  improper specimen collection/handling, submission of specimen other than nasopharyngeal swab, presence of viral mutation(s) within the areas targeted by this assay, and inadequate number of viral copies(<138 copies/mL). A negative result must be combined with clinical observations, patient history, and epidemiological information. The expected result is Negative.  Fact Sheet for Patients:  BloggerCourse.com  Fact Sheet for Healthcare Providers:  SeriousBroker.it  This test is no t yet approved or cleared by the [place] FDA and  has been authorized for detection and/or diagnosis of SARS-CoV-2 by FDA under an Emergency Use Authorization (EUA). This EUA will remain  in effect (meaning this test can be used) for the duration of the COVID-19 declaration under Section 564(b)(1) of the Act, 21 U.S.C.section 360bbb-3(b)(1), unless the authorization is terminated  or revoked sooner.       Influenza A by PCR POSITIVE (A) NEGATIVE Final   Influenza B by PCR NEGATIVE NEGATIVE Final    Comment: (NOTE) The Xpert Xpress SARS-CoV-2/FLU/RSV plus assay is intended as an aid in the diagnosis of influenza from Nasopharyngeal swab specimens and should not be used as a sole basis for treatment. Nasal washings and aspirates are unacceptable for Xpert Xpress SARS-CoV-2/FLU/RSV testing.  Fact Sheet for Patients: BloggerCourse.com  Fact Sheet for Healthcare  Providers: SeriousBroker.it  This test is not yet approved or cleared by the [place] FDA and has been authorized for detection and/or diagnosis of SARS-CoV-2 by FDA under an Emergency Use Authorization (EUA). This EUA will remain in effect (meaning this test can be used) for the duration of the COVID-19 declaration under Section 564(b)(1) of the Act, 21 U.S.C. section 360bbb-3(b)(1), unless the authorization is terminated or revoked.     Resp Syncytial Virus by PCR POSITIVE (A) NEGATIVE Final    Comment: (NOTE) Fact Sheet for Patients: BloggerCourse.com  Fact Sheet for Healthcare Providers: SeriousBroker.it  This test is not yet approved or cleared by the [place] FDA and has been authorized for detection and/or diagnosis of SARS-CoV-2 by FDA under an Emergency Use Authorization (EUA). This EUA will remain in effect (meaning this test can be used) for the duration of the COVID-19 declaration under Section 564(b)(1) of the Act, 21 U.S.C. section 360bbb-3(b)(1), unless the authorization is terminated or revoked.  Performed at [facility], [address] [place], [place] [address]   MRSA Next Gen by PCR, Nasal     Status: None   Collection Time: 12/27/23  8:19 AM   Specimen: Nasal Mucosa; Nasal Swab  Result Value Ref Range Status   MRSA by PCR Next Gen NOT DETECTED NOT DETECTED Final    Comment: (NOTE) The GeneXpert MRSA Assay (FDA approved for NASAL specimens only), is one component of a comprehensive MRSA colonization surveillance program. It is not intended to diagnose MRSA infection nor to guide or monitor treatment for MRSA infections. Test performance is not FDA approved in patients less than 3 years old. Performed at [facility], [address] [place], [place] [address]      Labs: BNP (last 3 results) Recent Labs    12/23/23 2254  BNP 2,941.3*    Basic Metabolic Panel: Recent Labs  Lab 12/26/23 0138 12/27/23 0543 12/28/23 0445 12/30/23 0422 01/01/24 0339  NA 135 130* 133* 134* 131*  K 3.6 4.1 3.9 4.1 3.5  CL 100 96* 98 99 96*  CO2 24 24 25 29 26   GLUCOSE 292* 313* 207* 175* 230*  BUN 26* 32* 27* 16 14  CREATININE 1.36* 1.22* 1.16* 0.95 0.97  CALCIUM 8.2* 8.7* 8.8* 8.8* 8.6*   Liver Function Tests: No results for input(s): "AST", "ALT", "ALKPHOS", "BILITOT", "PROT", "ALBUMIN" in the last 168 hours. No results for input(s): "LIPASE", "AMYLASE" in the last 168 hours. No results for input(s): "AMMONIA" in the last 168 hours. CBC: Recent Labs  Lab 12/26/23 0138 12/27/23 0543 12/28/23 0445 12/29/23 0505 12/30/23 0422  WBC 12.6* 14.9* 13.9* 13.5* 13.0*  HGB 8.5* 8.2* 8.4* 8.7* 9.3*  HCT 26.5* 25.8* 26.5* 27.0* 29.5*  MCV 74.9* 76.3* 75.5* 75.8* 75.6*  PLT 366 346 346 322 334   Cardiac Enzymes: No results for input(s): "CKTOTAL", "CKMB", "CKMBINDEX", "TROPONINI" in the last 168 hours. BNP: Invalid input(s): "POCBNP" CBG: Recent Labs  Lab 12/31/23 0756 12/31/23 1120 12/31/23 1529 01/01/24 0746 01/01/24 1133  GLUCAP 262* 259* 180* 203* 190*   D-Dimer No results for input(s): "DDIMER" in the last 72 hours. Hgb A1c No results for input(s): "HGBA1C" in the last 72 hours. Lipid Profile Recent Labs    01/01/24 0339  CHOL 120  HDL 43  LDLCALC 62  TRIG 73  CHOLHDL 2.8   Thyroid function studies No results for input(s): "TSH", "T4TOTAL", "T3FREE", "THYROIDAB" in the last 72 hours.  Invalid input(s): "FREET3" Anemia work up No results for input(s): "VITAMINB12", "FOLATE", "FERRITIN", "TIBC", "IRON", "RETICCTPCT" in the last 72 hours. Urinalysis    Component Value Date/Time   COLORURINE STRAW (A) 12/24/2023 1148   APPEARANCEUR HAZY (A) 12/24/2023 1148   APPEARANCEUR Cloudy (A) 09/03/2018 1537   LABSPEC 1.018 12/24/2023 1148   PHURINE 6.0 12/24/2023 1148   GLUCOSEU >=500 (A) 12/24/2023 1148   HGBUR  NEGATIVE 12/24/2023 1148   BILIRUBINUR NEGATIVE 12/24/2023 1148   BILIRUBINUR Negative 05/19/2020 1503   BILIRUBINUR Negative 09/03/2018 1537   KETONESUR 5 (A) 12/24/2023 1148   PROTEINUR NEGATIVE 12/24/2023 1148   UROBILINOGEN 0.2 05/19/2020 1503   NITRITE NEGATIVE 12/24/2023 1148   LEUKOCYTESUR SMALL (A) 12/24/2023 1148   Sepsis Labs Recent Labs  Lab 12/27/23 0543 12/28/23 0445 12/29/23 0505 12/30/23 0422  WBC 14.9* 13.9* 13.5* 13.0*   Microbiology Recent Results (from the past 240 hours)  Culture, blood (Routine x 2)     Status: None   Collection Time: 12/23/23  6:36 PM   Specimen: BLOOD  Result Value Ref Range Status   Specimen Description BLOOD BLOOD RIGHT ARM  Final   Special Requests   Final    BOTTLES DRAWN AEROBIC AND ANAEROBIC Blood Culture results may not be optimal due to an inadequate volume of blood received in culture bottles   Culture   Final    NO GROWTH 5 DAYS Performed at [facility], [address], [place], [place] [address]    Report Status 12/28/2023 FINAL  Final  Culture, blood (Routine x 2)     Status: None   Collection Time: 12/23/23  6:38 PM   Specimen: BLOOD  Result Value Ref Range Status   Specimen  Description BLOOD BLOOD RIGHT ARM  Final   Special Requests   Final    BOTTLES DRAWN AEROBIC AND ANAEROBIC Blood Culture adequate volume   Culture   Final    NO GROWTH 5 DAYS Performed at [facility], [address] [place], [place] [address]    Report Status 12/28/2023 FINAL  Final  Resp panel by RT-PCR (RSV, Flu A&B, Covid) Anterior Nasal Swab     Status: Abnormal   Collection Time: 12/23/23  6:47 PM   Specimen: Anterior Nasal Swab  Result Value Ref Range Status   SARS Coronavirus 2 by RT PCR NEGATIVE NEGATIVE Final    Comment: (NOTE) SARS-CoV-2 target nucleic acids are NOT DETECTED.  The SARS-CoV-2 RNA is generally detectable in upper respiratory specimens during the acute phase of infection. The  lowest concentration of SARS-CoV-2 viral copies this assay can detect is 138 copies/mL. A negative result does not preclude SARS-Cov-2 infection and should not be used as the sole basis for treatment or other patient management decisions. A negative result may occur with  improper specimen collection/handling, submission of specimen other than nasopharyngeal swab, presence of viral mutation(s) within the areas targeted by this assay, and inadequate number of viral copies(<138 copies/mL). A negative result must be combined with clinical observations, patient history, and epidemiological information. The expected result is Negative.  Fact Sheet for Patients:  BloggerCourse.com  Fact Sheet for Healthcare Providers:  SeriousBroker.it  This test is no t yet approved or cleared by the [place] FDA and  has been authorized for detection and/or diagnosis of SARS-CoV-2 by FDA under an Emergency Use Authorization (EUA). This EUA will remain  in effect (meaning this test can be used) for the duration of the COVID-19 declaration under Section 564(b)(1) of the Act, 21 U.S.C.section 360bbb-3(b)(1), unless the authorization is terminated  or revoked sooner.       Influenza A by PCR POSITIVE (A) NEGATIVE Final   Influenza B by PCR NEGATIVE NEGATIVE Final    Comment: (NOTE) The Xpert Xpress SARS-CoV-2/FLU/RSV plus assay is intended as an aid in the diagnosis of influenza from Nasopharyngeal swab specimens and should not be used as a sole basis for treatment. Nasal washings and aspirates are unacceptable for Xpert Xpress SARS-CoV-2/FLU/RSV testing.  Fact Sheet for Patients: BloggerCourse.com  Fact Sheet for Healthcare Providers: SeriousBroker.it  This test is not yet approved or cleared by the [place] FDA and has been authorized for detection and/or diagnosis of SARS-CoV-2 by FDA  under an Emergency Use Authorization (EUA). This EUA will remain in effect (meaning this test can be used) for the duration of the COVID-19 declaration under Section 564(b)(1) of the Act, 21 U.S.C. section 360bbb-3(b)(1), unless the authorization is terminated or revoked.     Resp Syncytial Virus by PCR POSITIVE (A) NEGATIVE Final    Comment: (NOTE) Fact Sheet for Patients: BloggerCourse.com  Fact Sheet for Healthcare Providers: SeriousBroker.it  This test is not yet approved or cleared by the [place] FDA and has been authorized for detection and/or diagnosis of SARS-CoV-2 by FDA under an Emergency Use Authorization (EUA). This EUA will remain in effect (meaning this test can be used) for the duration of the COVID-19 declaration under Section 564(b)(1) of the Act, 21 U.S.C. section 360bbb-3(b)(1), unless the authorization is terminated or revoked.  Performed at [facility], [address], [place], [place] [address]   MRSA Next Gen by PCR, Nasal     Status: None   Collection Time: 12/27/23  8:19 AM   Specimen: Nasal Mucosa; Nasal Swab  Result Value Ref Range Status   MRSA by PCR Next Gen NOT DETECTED NOT DETECTED Final    Comment: (NOTE) The GeneXpert MRSA Assay (FDA approved for NASAL specimens only), is one component of a comprehensive MRSA colonization surveillance program. It is not intended to diagnose MRSA infection nor to guide or monitor treatment for MRSA infections. Test performance is not FDA approved in patients less than 72 years old. Performed at [facility], [address], [place], [place] [address]      Time coordinating discharge: Over 30 minutes  SIGNED:   Charise Killian, MD  [facility] 01/01/2024, 3:45 PM Pager   If 7PM-7AM, please contact night-coverage [email or web address]

## 2024-01-07 ENCOUNTER — Ambulatory Visit: Attending: Medical | Admitting: Medical

## 2024-01-07 ENCOUNTER — Encounter: Payer: Self-pay | Admitting: Medical

## 2024-01-07 VITALS — BP 130/76 | HR 91 | Ht 62.0 in | Wt 148.4 lb

## 2024-01-07 DIAGNOSIS — D649 Anemia, unspecified: Secondary | ICD-10-CM | POA: Diagnosis not present

## 2024-01-07 DIAGNOSIS — E782 Mixed hyperlipidemia: Secondary | ICD-10-CM

## 2024-01-07 DIAGNOSIS — Z79899 Other long term (current) drug therapy: Secondary | ICD-10-CM

## 2024-01-07 DIAGNOSIS — I480 Paroxysmal atrial fibrillation: Secondary | ICD-10-CM | POA: Diagnosis not present

## 2024-01-07 DIAGNOSIS — I5022 Chronic systolic (congestive) heart failure: Secondary | ICD-10-CM | POA: Diagnosis not present

## 2024-01-07 MED ORDER — AMIODARONE HCL 100 MG PO TABS: 100.0000 mg | ORAL_TABLET | Freq: Every day | ORAL | 3 refills | Status: DC

## 2024-01-07 MED ORDER — DAPAGLIFLOZIN PROPANEDIOL 10 MG PO TABS: 10.0000 mg | ORAL_TABLET | Freq: Every day | ORAL | 3 refills | Status: AC

## 2024-01-07 NOTE — Patient Instructions (Signed)
 Medication Instructions:  Your physician recommends the following medication changes.   START TAKING: Farxiga 10 MG daily.   DECREASE: Amiodarone to 100 MG daily.   *If you need a refill on your cardiac medications before your next appointment, please call your pharmacy*  Lab Work: Your provider would like for you to have following labs drawn today CBC.  Your provider would like for you to return in 2 weeks to have the following labs drawn: BMP.   Please go to [facility] [address] ([facility] [address], [place] [address] You do not need an appointment.  They are open from 8 am- 4:30 pm.  Lunch from 1:00 pm- 2:00 pm You will not need to be fasting.   If you have labs (blood work) drawn today and your tests are completely normal, you will receive your results only by: MyChart Message (if you have MyChart) OR A paper copy in the mail If you have any lab test that is abnormal or we need to change your treatment, we will call you to review the results.  Testing/Procedures: None ordered.  Follow-Up: At [facility], you and your health needs are our priority.  As part of our continuing mission to provide you with exceptional heart care, our providers are all part of one team.  This team includes your primary Cardiologist (physician) and Advanced Practice Providers or APPs (Physician Assistants and Nurse Practitioners) who all work together to provide you with the care you need, when you need it.  Your next appointment:   1 month(s)  Provider:   You may see Julien Nordmann, MD or one of the following Advanced Practice Providers on your designated Care Team:   Nicolasa Ducking, NP Ames Dura, PA-C Eula Listen, PA-C Cadence Norge, PA-C Charlsie Quest, NP Carlos Levering, NP    We recommend signing up for the patient portal called "MyChart".  Sign up information is provided on this After Visit Summary.  MyChart is used to connect with  patients for Virtual Visits (Telemedicine).  Patients are able to view lab/test results, encounter notes, upcoming appointments, etc.  Non-urgent messages can be sent to your provider as well.   To learn more about what you can do with MyChart, go to [email or web address].

## 2024-01-07 NOTE — Progress Notes (Signed)
 Cardiology Office Note:  .   Date:  01/07/2024  ID:  Purcell Nails, DOB Jun 19, 1936, MRN [id number] PCP: Judy Pimple, MD  [facility] Providers Cardiologist:  Julien Nordmann, MD     History of Present Illness: .   Laura Bell is a 88 y.o. female with a h/o HTN, HLD, GERD, DM2, recurrent falls, remote tobacco abuse, breast/cervical cancer, cardiomyopathy with an EF of 20-25%, and recently diagnosed A-fib who is being seen for hospital follow-up.  The patient was previously evaluated in 2020 for mechanical fall and tachycardia.  She had not taken her beta-blocker that morning.  Troponin was elevated to 129.  Echo showed EF of 40 to 45% with diffuse hypokinesis.  Recommendations were for outpatient stress testing.  She was seen in follow-up for stress test was never performed.  The patient presented to the ER 12/23/2023 for shortness of breath.  Patient was found to have influenza A, RSV, new onset A-fib RVR, AKI, thrombophlebitis at previous IV site, acute CHF.  Initial rhythm was sinus tachycardia with heart rates up to 120.  On 3/27 the patient went into A-fib RVR started on IV Dilt drip which was stopped due to hypotension.  Patient was started on amiodarone and digoxin with conversion to normal sinus rhythm.  Patient intermittently required midodrine.  Echo showed EF of 20 to 25%.  She was treated with IV Lasix. She was not discharged on a diuretic. She was discharged on Eliquis, Losartan and Toprol.  Today, the patient reports she has been doing rehab at [facility] (which her normal place of residence). She uses a rollator at her house. She denies chest pain or SOB. No lower leg edema. She is in NSR today. She denies bleeding issues.   Studies Reviewed: .       Echo 11/2023   1. Left ventricular ejection fraction, by estimation, is 20 to 25%. Left  ventricular ejection fraction by PLAX is 24 %. The left ventricle has  severely decreased function. The left ventricle  demonstrates global  hypokinesis. Left ventricular diastolic  parameters are consistent with Grade II diastolic dysfunction  (pseudonormalization).   2. Right ventricular systolic function is normal. The right ventricular  size is normal. There is normal pulmonary artery systolic pressure.   3. The mitral valve is normal in structure. Mild mitral valve  regurgitation.   4. The aortic valve is tricuspid. Aortic valve regurgitation is mild.   5. The inferior vena cava is normal in size with greater than 50%  respiratory variability, suggesting right atrial pressure of 3 mmHg.   Echo 06/2019  1. The left ventricle has mild-moderately reduced systolic function, with  an ejection fraction of 40-45%. The cavity size was normal. Left  ventricular diastolic Doppler parameters are consistent with impaired  relaxation. Left ventricular diffuse  hypokinesis.   2. The right ventricle has normal systolic function. The cavity was  normal. There is no increase in right ventricular wall thickness.Unable to  estimate RVSP       Physical Exam:   VS:  BP 130/76   Pulse 91   Ht 5\' 2"  (1.575 m)   Wt 148 lb 6.4 oz (67.3 kg)   SpO2 98%   BMI 27.14 kg/m    Wt Readings from Last 3 Encounters:  01/07/24 148 lb 6.4 oz (67.3 kg)  01/01/24 153 lb 14.1 oz (69.8 kg)  08/28/20 146 lb (66.2 kg)    GEN: Well nourished, well developed in no acute  distress NECK: No JVD; No carotid bruits CARDIAC: RRR, no murmurs, rubs, gallops RESPIRATORY:  Clear to auscultation without rales, wheezing or rhonchi  ABDOMEN: Soft, non-tender, non-distended EXTREMITIES:  No edema; No deformity   ASSESSMENT AND PLAN: .    Paroxysmal Afib She is in NSR on EKG today. Qtc . Appears it has been long in the past. I will decrease amiodarone to 100mg  daily and refer to EP for further recs. Continue Toprol for rate control. Continue Eliquis 5mg  BID for stroke ppx.   Chronic HFrEF Echo showed LVEF 20-25% in the setting of new  onset Afib and multiple other acute issues. She was not discharged on a diuretic. She is euvolemic. Continue Losartan 12.5mg  daily and Toprol 100mg  daily. I will start Farxiga 10mg  daily. BMET in 2 weeks. We will continue GDMT at follow-up. Plan to re-check echo in 6 weeks. If EF is still low, consider heart cath.   Microcytic Anemia I will check a CBC. Hgb in the hospital 9-8.  HLD LDL 62. Continue simvastatin 10mg  daily.     Dispo: Follow-up in 1 month  Signed, Jadia Capers David Stall, PA-C

## 2024-01-08 LAB — CBC
Hematocrit: 32.4 % — ABNORMAL LOW (ref 34.0–46.6)
Hemoglobin: 9.8 g/dL — ABNORMAL LOW (ref 11.1–15.9)
MCH: 24.6 pg — ABNORMAL LOW (ref 26.6–33.0)
MCHC: 30.2 g/dL — ABNORMAL LOW (ref 31.5–35.7)
MCV: 81 fL (ref 79–97)
Platelets: 381 10*3/uL (ref 150–450)
RBC: 3.98 x10E6/uL (ref 3.77–5.28)
RDW: 17.2 % — ABNORMAL HIGH (ref 11.7–15.4)
WBC: 9 10*3/uL (ref 3.4–10.8)

## 2024-01-12 NOTE — Progress Notes (Unsigned)
 Electrophysiology Office Note:   Date:  01/13/2024  ID:  Laura Bell, DOB [birth year], MRN [id number]  Primary Cardiologist: Julien Nordmann, MD Electrophysiologist: Nobie Putnam, MD      History of Present Illness:   Laura Bell is a 88 y.o. female with h/o HTN, HLD, GERD, DM2, recurrent falls, remote tobacco abuse, breast/cervical cancer, cardiomyopathy with an EF of 20-25%, and recently diagnosed A-fib who is being seen today for evaluation of her atrial fibrillation at the request of Cadence Furth, [place].   Discussed the use of AI scribe software for clinical note transcription with the patient, who gave verbal consent to proceed.  History of Present Illness Laura Bell is an 88 year old female with atrial fibrillation who presents for follow-up after hospitalization. The patient presented to the ER 12/23/2023 for shortness of breath.  Patient was found to have influenza A, RSV, AKI, thrombophlebitis at previous IV site. She was found to have new onset AF with rapid ventricular rates in the setting of her acute illness. Echo showed EF of 20 to 25%.  She was treated with IV Lasix.She was discharged on Eliquis, Losartan and Toprol.  Since returning to her nursing home, patient reports doing relatively well.  She is able to do some ambulation on her own such as to the bathroom and the refrigerator. She uses a walker for mobility.  She has not noticed any limiting symptoms. She occasionally experiences heart fluttering or skipping without associated pain. No dizziness, lightheadedness, or syncope when moving around.   Review of systems complete and found to be negative unless listed in HPI.   EP Information / Studies Reviewed:    EKG is ordered today. Personal review as below.  EKG Interpretation Date/Time:  Tuesday January 13 2024 10:58:20 EDT Ventricular Rate:  89 PR Interval:  152 QRS Duration:  74 QT Interval:  426 QTC Calculation: 518 R Axis:   26  Text  Interpretation: Sinus rhythm with Premature ventricular complexes Prolonged QT When compared with ECG of 07-Jan-2024 08:38, Premature ventricular complexes are now Present T wave inversion less evident in Inferior leads QT interval shorter Confirmed by Nobie Putnam [id number]) on 01/13/2024 9:24:59 PM   EKG 12/25/23: AF   Echo 12/26/23:   1. Left ventricular ejection fraction, by estimation, is 20 to 25%. Left  ventricular ejection fraction by PLAX is 24 %. The left ventricle has  severely decreased function. The left ventricle demonstrates global  hypokinesis. Left ventricular diastolic  parameters are consistent with Grade II diastolic dysfunction  (pseudonormalization).   2. Right ventricular systolic function is normal. The right ventricular  size is normal. There is normal pulmonary artery systolic pressure.   3. The mitral valve is normal in structure. Mild mitral valve  regurgitation.   4. The aortic valve is tricuspid. Aortic valve regurgitation is mild.   5. The inferior vena cava is normal in size with greater than 50%  respiratory variability, suggesting right atrial pressure of 3 mmHg.    Risk Assessment/Calculations:    CHA2DS2-VASc Score = 6   This indicates a 9.7% annual risk of stroke. The patient's score is based upon: CHF History: 1 HTN History: 1 Diabetes History: 1 Stroke History: 0 Vascular Disease History: 0 Age Score: 2 Gender Score: 1             Physical Exam:   VS:  BP 112/66 (BP Location: Right Arm)   Pulse 89   Ht 5' 2.5" (1.588 m)   Wt 162 lb (  73.5 kg)   SpO2 96%   BMI 29.16 kg/m    Wt Readings from Last 3 Encounters:  01/13/24 162 lb (73.5 kg)  01/07/24 148 lb 6.4 oz (67.3 kg)  01/01/24 153 lb 14.1 oz (69.8 kg)     GEN: Well nourished, well developed in no acute distress NECK: No JVD CARDIAC: Normal rate, regular RESPIRATORY:  Clear to auscultation without rales, wheezing or rhonchi  ABDOMEN: Soft, non-distended EXTREMITIES:  Trace  edema; No deformity   ASSESSMENT AND PLAN:    #. Paroxysmal atrial fibrillation: This occurred in the setting of acute illness with flu and RSV.  She is now maintaining sinus rhythm on amiodarone.  Given her degree of LV dysfunction in the setting of atrial fibrillation, I think rhythm control strategy is best.  She is not a candidate for catheter ablation.  Her recent EKGs with some degree of QT prolongation would prohibit dofetilide and sotalol. #. High risk medication use: Amiodarone.  - Continue amiodarone 100 mg once daily.  #. QT prolongation: She had a markedly elevated QT interval on March 27.  I suspect this was due to acute illness, chronic medications, azithromycin.  QT interval has shortened significantly on EKG today still remains prolonged.  Given her degree of LV dysfunction in the setting of atrial fibrillation, maintenance of sinus rhythm is important.  We should eliminate other QT prolonging medications in efforts to try and keep her on amiodarone.   - Stop citalopram, tramadol, azithromycin. Based on paperwork from facility it appears she has completed course of azithromycin. - She needs to avoid QT prolonging medications such as azithromycin in the future.  I have communicated this request in the paperwork to her nursing facility.  #. Hypercoagulable state due to atrial fibrillation: CHADSVASC score of 6.  - Continue Eliquis 5 mg twice daily.   #. Chronic systolic heart failure: Decrease LVEF to 20-25% in the setting of atrial fibrillation and acute illness.  Previously had been 40 to 45%. - Continue GDMT regimen of dapagliflozin 10 mg once daily, losartan 12.5 mg once daily, metoprolol XL 100 mg once daily.  Follow-up with primary cardiologist.  Continue close follow-up with general cardiology.  EP as needed.  Signed, Ardeen Kohler, MD

## 2024-01-13 ENCOUNTER — Ambulatory Visit: Attending: Cardiology | Admitting: Cardiology

## 2024-01-13 VITALS — BP 112/66 | HR 89 | Ht 62.5 in | Wt 162.0 lb

## 2024-01-13 DIAGNOSIS — D6869 Other thrombophilia: Secondary | ICD-10-CM

## 2024-01-13 DIAGNOSIS — I5022 Chronic systolic (congestive) heart failure: Secondary | ICD-10-CM

## 2024-01-13 DIAGNOSIS — I480 Paroxysmal atrial fibrillation: Secondary | ICD-10-CM | POA: Diagnosis not present

## 2024-01-13 DIAGNOSIS — R9431 Abnormal electrocardiogram [ECG] [EKG]: Secondary | ICD-10-CM

## 2024-01-13 DIAGNOSIS — Z79899 Other long term (current) drug therapy: Secondary | ICD-10-CM

## 2024-01-13 NOTE — Patient Instructions (Signed)
 Medication Instructions:  Your physician has recommended you make the following change in your medication:  1) STOP taking citalopram and tramadol  *If you need a refill on your cardiac medications before your next appointment, please call your pharmacy*  Lab Work: LFT and TSH in 6 months  Follow-Up: At [facility], you and your health needs are our priority.  As part of our continuing mission to provide you with exceptional heart care, our providers are all part of one team.  This team includes your primary Cardiologist (physician) and Advanced Practice Providers or APPs (Physician Assistants and Nurse Practitioners) who all work together to provide you with the care you need, when you need it.  Your next appointment:   Follow up as planned with General Cardiology

## 2024-01-29 ENCOUNTER — Encounter: Payer: Self-pay | Admitting: Cardiovascular Disease

## 2024-02-06 ENCOUNTER — Encounter: Payer: Self-pay | Admitting: Medical

## 2024-02-06 ENCOUNTER — Ambulatory Visit: Attending: Medical | Admitting: Medical

## 2024-02-06 VITALS — BP 120/70 | HR 90 | Ht 62.5 in | Wt 148.0 lb

## 2024-02-06 DIAGNOSIS — Z79899 Other long term (current) drug therapy: Secondary | ICD-10-CM

## 2024-02-06 DIAGNOSIS — D649 Anemia, unspecified: Secondary | ICD-10-CM

## 2024-02-06 DIAGNOSIS — I480 Paroxysmal atrial fibrillation: Secondary | ICD-10-CM

## 2024-02-06 DIAGNOSIS — I5043 Acute on chronic combined systolic (congestive) and diastolic (congestive) heart failure: Secondary | ICD-10-CM | POA: Diagnosis not present

## 2024-02-06 DIAGNOSIS — R9431 Abnormal electrocardiogram [ECG] [EKG]: Secondary | ICD-10-CM

## 2024-02-06 DIAGNOSIS — E782 Mixed hyperlipidemia: Secondary | ICD-10-CM

## 2024-02-06 MED ORDER — FUROSEMIDE 40 MG PO TABS: 40.0000 mg | ORAL_TABLET | Freq: Every day | ORAL | 3 refills | Status: DC

## 2024-02-06 NOTE — Patient Instructions (Signed)
 Medication Instructions:  Your physician recommends the following medication changes.  INCREASE: Lasix  to 40 mg daily  *If you need a refill on your cardiac medications before your next appointment, please call your pharmacy*  Lab Work: Your provider would like for you to have following labs drawn today BMP, BNP, CBC.    Your provider would like for you to return in 2 weeks to have the following labs drawn: BMP.   Please go to [facility] [address] ([facility]) [address], [place] [address] You do not need an appointment.  They are open from 8 am- 4:30 pm.  Lunch from 1:00 pm- 2:00 pm You Do Not need to be fasting.   You may also go to one of the following [facility]:  [address] [place], [place] [address] Phone: [phone] Lab hours: Mon-Fri 8 am- 5 pm    Lunch 12 pm- 1 pm  [address] [place],  [place]  [address]  [place]  Phone: [phone] Lab hours: 7 am- 4 pm Lunch 12 pm-1 pm   [address] [place],  [place]  [address]  [place]  Phone: [phone] Lab hours: Mon-Fri 8 am- 5 pm    Lunch 12 pm- 1 pm  If you have labs (blood work) drawn today and your tests are completely normal, you will receive your results only by: MyChart Message (if you have MyChart) OR A paper copy in the mail If you have any lab test that is abnormal or we need to change your treatment, we will call you to review the results.  Testing/Procedures: No test ordered today   Follow-Up: At [facility], you and your health needs are our priority.  As part of our continuing mission to provide you with exceptional heart care, our providers are all part of one team.  This team includes your primary Cardiologist (physician) and Advanced Practice Providers or APPs (Physician Assistants and Nurse Practitioners) who all work together to provide you with the care you need, when you need it.  Your next appointment:   3 week(s)  Provider:   Belva Boyden, MD or Cadence Gennaro Khat,  PA-C

## 2024-02-06 NOTE — Progress Notes (Signed)
 Cardiology Office Note:  .   Date:  02/06/2024  ID:  Laura Bell, DOB 01/22/1936, MRN [id number] PCP: Clemens Curt, MD  [facility] Providers Cardiologist:  Belva Boyden, MD Electrophysiologist:  Ardeen Kohler, MD {    History of Present Illness: .   Laura Bell is a 88 y.o. female with a h/o HTN, HLD, GERD, DM2, recurrent falls, remote tobacco abuse, breast/cervical cancer, cardiomyopathy with an EF of 20-25%, and recently diagnosed A-fib who is being seen for 1 month follow-up for CHF and Afib.   The patient was previously evaluated in 2020 for mechanical fall and tachycardia.  She had not taken her beta-blocker that morning.  Troponin was elevated to 129.  Echo showed EF of 40 to 45% with diffuse hypokinesis.  Recommendations were for outpatient stress testing.  She was seen in follow-up for stress test was never performed.   The patient presented to the ER 12/23/2023 for shortness of breath.  Patient was found to have influenza A, RSV, new onset A-fib RVR, AKI, thrombophlebitis at previous IV site, acute CHF.  Initial rhythm was sinus tachycardia with heart rates up to 120.  On 3/27 the patient went into A-fib RVR started on IV Dilt drip which was stopped due to hypotension.  Patient was started on amiodarone  and digoxin  with conversion to normal sinus rhythm.  Patient intermittently required midodrine .  Echo showed EF of 20 to 25%.  She was treated with IV Lasix . She was not discharged on a diuretic. She was discharged on Eliquis , Losartan  and Toprol .  The patient was last seen for 01/07/24 and was overall doing well at rehab.  QTc was 584 ms and amiodarone  was decreased and she was referred to EP. EP recommended to continue amiodarone  100mg  daily and to stop citalopram  ,tramadol  and azithromycin .   Today, she lives in assisted living at [facility]. She is still doing rehab. Says she had 2 different flu-like illness since the last visit and feels very weak. EKG today show  qtc , which is improved from prior. She denies chest pain and SOB. She reports 2 weeks of lower leg swelling and was started on lasix  20mg  daily. She still has lower leg edema on exam.   Studies Reviewed: Laura Bell   EKG Interpretation Date/Time:  Friday Feb 06 2024 13:37:43 EDT Ventricular Rate:  90 PR Interval:  148 QRS Duration:  70 QT Interval:  384 QTC Calculation: 469 R Axis:   43  Text Interpretation: Normal sinus rhythm Nonspecific T wave abnormality When compared with ECG of 13-Jan-2024 10:58, Premature ventricular complexes are no longer Present T wave inversion no longer evident in Inferior leads T wave inversion no longer evident in Anterolateral leads QT has shortened Confirmed by Laura Bell, Laura Bell ([id number]) on 02/06/2024 1:51:03 PM    Echo 11/2023   1. Left ventricular ejection fraction, by estimation, is 20 to 25%. Left  ventricular ejection fraction by PLAX is 24 %. The left ventricle has  severely decreased function. The left ventricle demonstrates global  hypokinesis. Left ventricular diastolic  parameters are consistent with Grade II diastolic dysfunction  (pseudonormalization).   2. Right ventricular systolic function is normal. The right ventricular  size is normal. There is normal pulmonary artery systolic pressure.   3. The mitral valve is normal in structure. Mild mitral valve  regurgitation.   4. The aortic valve is tricuspid. Aortic valve regurgitation is mild.   5. The inferior vena cava is normal in size with greater  than 50%  respiratory variability, suggesting right atrial pressure of 3 mmHg.    Echo 06/2019  1. The left ventricle has mild-moderately reduced systolic function, with  an ejection fraction of 40-45%. The cavity size was normal. Left  ventricular diastolic Doppler parameters are consistent with impaired  relaxation. Left ventricular diffuse  hypokinesis.   2. The right ventricle has normal systolic function. The cavity was  normal. There is no  increase in right ventricular wall thickness.Unable to  estimate RVSP      Physical Exam:   VS:  BP 120/70 (BP Location: Left Arm, Patient Position: Sitting, Cuff Size: Normal)   Pulse 90   Ht 5' 2.5" (1.588 m)   Wt 148 lb (67.1 kg)   SpO2 98%   BMI 26.64 kg/m    Wt Readings from Last 3 Encounters:  02/06/24 148 lb (67.1 kg)  01/13/24 162 lb (73.5 kg)  01/07/24 148 lb 6.4 oz (67.3 kg)    GEN: Well nourished, well developed in no acute distress NECK: No JVD; No carotid bruits CARDIAC: RRR, no murmurs, rubs, gallops RESPIRATORY:  Clear to auscultation without rales, wheezing or rhonchi  ABDOMEN: Soft, non-tender, non-distended EXTREMITIES:  1-2+ lower leg edema; No deformity   ASSESSMENT AND PLAN: .    Acute on chronic systolic and diastolic heart failure Prior echo from 2020 showed LVEF 40-45%, suspected tachy-mediated, but she was lost to follow-up. Echo in March 2025 showed EF of 20 to 25% in the setting of new onset A-fib, influenza A, RSV,  AKI, thrombophlebitis at IV site.  She was not discharged on a diuretic, however has since been started on Lasix  20 mg at her facility for lower leg edema.  On exam she has 1-2+ lower leg edema.  She denies shortness of breath.  I will increase Lasix  to 40 mg daily. BMET in 2 weeks. Continue Farxiga  10 mg daily, losartan  12.5 mg daily and Toprol  100 mg daily.  I will check a BMET, BNP and CBC.   Paroxysmal A-fib Patient remains in normal sinus rhythm on EKG today.  QTc improved from to .  Continue Eliquis  5 mg twice daily for stroke prophylaxis.  Continue amiodarone  100 mg daily.  Microcytic anemia CBC again today.most recent Hgb up to 9.8  Hyperlipidemia LDL 62.  Continue simvastatin  10 mg daily.    Dispo: Follow-up in 2-3 weeks  Signed, Shandy Checo Rebekah Canada, PA-C

## 2024-02-07 LAB — CBC
Hematocrit: 32.2 % — ABNORMAL LOW (ref 34.0–46.6)
Hemoglobin: 9.8 g/dL — ABNORMAL LOW (ref 11.1–15.9)
MCH: 24.2 pg — ABNORMAL LOW (ref 26.6–33.0)
MCHC: 30.4 g/dL — ABNORMAL LOW (ref 31.5–35.7)
MCV: 80 fL (ref 79–97)
Platelets: 472 10*3/uL — ABNORMAL HIGH (ref 150–450)
RBC: 4.05 x10E6/uL (ref 3.77–5.28)
RDW: 15.9 % — ABNORMAL HIGH (ref 11.7–15.4)
WBC: 7.7 10*3/uL (ref 3.4–10.8)

## 2024-02-07 LAB — BASIC METABOLIC PANEL WITH GFR
BUN/Creatinine Ratio: 16 (ref 12–28)
BUN: 15 mg/dL (ref 8–27)
CO2: 22 mmol/L (ref 20–29)
Calcium: 9.9 mg/dL (ref 8.7–10.3)
Chloride: 96 mmol/L (ref 96–106)
Creatinine, Ser: 0.95 mg/dL (ref 0.57–1.00)
Glucose: 163 mg/dL — ABNORMAL HIGH (ref 70–99)
Potassium: 5.1 mmol/L (ref 3.5–5.2)
Sodium: 135 mmol/L (ref 134–144)
eGFR: 59 mL/min/{1.73_m2} — ABNORMAL LOW (ref >59)

## 2024-02-07 LAB — BRAIN NATRIURETIC PEPTIDE: BNP: 190.5 pg/mL — ABNORMAL HIGH (ref 0.0–100.0)

## 2024-02-10 ENCOUNTER — Ambulatory Visit: Payer: Self-pay

## 2024-03-04 ENCOUNTER — Ambulatory Visit: Attending: Medical | Admitting: Medical

## 2024-03-04 ENCOUNTER — Encounter: Payer: Self-pay | Admitting: Medical

## 2024-03-04 VITALS — BP 140/72 | HR 85 | Ht 62.0 in | Wt 157.2 lb

## 2024-03-04 DIAGNOSIS — R600 Localized edema: Secondary | ICD-10-CM

## 2024-03-04 DIAGNOSIS — I5022 Chronic systolic (congestive) heart failure: Secondary | ICD-10-CM | POA: Diagnosis not present

## 2024-03-04 DIAGNOSIS — I480 Paroxysmal atrial fibrillation: Secondary | ICD-10-CM | POA: Diagnosis not present

## 2024-03-04 DIAGNOSIS — R9431 Abnormal electrocardiogram [ECG] [EKG]: Secondary | ICD-10-CM

## 2024-03-04 DIAGNOSIS — E782 Mixed hyperlipidemia: Secondary | ICD-10-CM

## 2024-03-04 NOTE — Patient Instructions (Signed)
 Medication Instructions:  Your physician recommends the following medication changes.   INCREASE: Lasix  40mg  by mouth twice a day x 3 days,  After 3 days, return to Lasix  40mg  by mouth daily   *If you need a refill on your cardiac medications before your next appointment, please call your pharmacy*  Lab Work: Your provider would like for you to have following labs drawn today BMET.   If you have labs (blood work) drawn today and your tests are completely normal, you will receive your results only by: MyChart Message (if you have MyChart) OR A paper copy in the mail If you have any lab test that is abnormal or we need to change your treatment, we will call you to review the results.  Testing/Procedures: No test ordered today   Follow-Up: At [facility], you and your health needs are our priority.  As part of our continuing mission to provide you with exceptional heart care, our providers are all part of one team.  This team includes your primary Cardiologist (physician) and Advanced Practice Providers or APPs (Physician Assistants and Nurse Practitioners) who all work together to provide you with the care you need, when you need it.  Your next appointment:   1 month(s)  Provider:   You may see Timothy Gollan, MD or one of the following Advanced Practice Providers on your designated Care Team:    Cadence Gennaro Khat, [place]   We recommend signing up for the patient portal called "MyChart".  Sign up information is provided on this After Visit Summary.  MyChart is used to connect with patients for Virtual Visits (Telemedicine).  Patients are able to view lab/test results, encounter notes, upcoming appointments, etc.  Non-urgent messages can be sent to your provider as well.   To learn more about what you can do with MyChart, go to [email or web address].   Other Instructions  Recommends Compression Wraps on both lower extremities Low Sodium Diet

## 2024-03-04 NOTE — Progress Notes (Unsigned)
 Cardiology Office Note   Date:  03/04/2024  ID:  Laura Bell, DOB Feb 24, 1936, MRN [id number] PCP: Clemens Curt, MD  [facility] Providers Cardiologist:  Belva Boyden, MD Electrophysiologist:  Ardeen Kohler, MD   History of Present Illness Laura Bell is a 88 y.o. female with a h/o HTN, HLD, GERD, DM2, recurrent falls, remote tobacco abuse, breast/cervical cancer, cardiomyopathy with an EF of 20-25%, and A-fib who is being seen for follow-up for CHF and Afib.   The patient was previously evaluated in 2020 for mechanical fall and tachycardia.  She had not taken her beta-blocker that morning.  Troponin was elevated to 129.  Echo showed EF of 40 to 45% with diffuse hypokinesis.  Recommendations were for outpatient stress testing.  She was seen in follow-up for stress test was never performed.   The patient presented to the ER 12/23/2023 for shortness of breath.  Patient was found to have influenza A, RSV, new onset A-fib RVR, AKI, thrombophlebitis at previous IV site, acute CHF.  Initial rhythm was sinus tachycardia with heart rates up to 120.  On 3/27 the patient went into A-fib RVR started on IV Dilt drip which was stopped due to hypotension.  Patient was started on amiodarone  and digoxin  with conversion to normal sinus rhythm.  Patient intermittently required midodrine .  Echo showed EF of 20 to 25%.  She was treated with IV Lasix . She was not discharged on a diuretic. She was discharged on Eliquis , Losartan  and Toprol .   The patient was seen for 01/07/24 and was overall doing well at rehab.  QTc was 584 ms and amiodarone  was decreased and she was referred to EP. EP recommended to continue amiodarone  100mg  daily and to stop citalopram  ,tramadol  and azithromycin .   Patient was last seen on 925 was overall doing well at rehab.  EKG showed QTc 469 MS.  Lasix  increased to 40 mg daily.  Today, the patient reports she feels legs are the same. On exam, she still has lower leg edema.  She denies chest pain or SOB.   Studies Reviewed EKG Interpretation Date/Time:  Thursday March 04 2024 11:52:52 EDT Ventricular Rate:  85 PR Interval:  154 QRS Duration:  68 QT Interval:  386 QTC Calculation: 459 R Axis:   6  Text Interpretation: Normal sinus rhythm Nonspecific ST and T wave abnormality When compared with ECG of 06-Feb-2024 13:37, No significant change was found Confirmed by Gennaro Khat, Akasia Ahmad ([id number]) on 03/04/2024 11:56:05 AM    Echo 11/2023   1. Left ventricular ejection fraction, by estimation, is 20 to 25%. Left  ventricular ejection fraction by PLAX is 24 %. The left ventricle has  severely decreased function. The left ventricle demonstrates global  hypokinesis. Left ventricular diastolic  parameters are consistent with Grade II diastolic dysfunction  (pseudonormalization).   2. Right ventricular systolic function is normal. The right ventricular  size is normal. There is normal pulmonary artery systolic pressure.   3. The mitral valve is normal in structure. Mild mitral valve  regurgitation.   4. The aortic valve is tricuspid. Aortic valve regurgitation is mild.   5. The inferior vena cava is normal in size with greater than 50%  respiratory variability, suggesting right atrial pressure of 3 mmHg.    Echo 06/2019  1. The left ventricle has mild-moderately reduced systolic function, with  an ejection fraction of 40-45%. The cavity size was normal. Left  ventricular diastolic Doppler parameters are consistent with impaired  relaxation. Left ventricular  diffuse  hypokinesis.   2. The right ventricle has normal systolic function. The cavity was  normal. There is no increase in right ventricular wall thickness.Unable to  estimate RVSP   Physical Exam VS:  BP (!) 140/72 (BP Location: Right Arm, Patient Position: Sitting, Cuff Size: Normal)   Pulse 85   Ht 5\' 2"  (1.575 m)   Wt 157 lb 3 oz (71.3 kg)   SpO2 97%   BMI 28.75 kg/m    Wt Readings from Last 3  Encounters:  03/04/24 157 lb 3 oz (71.3 kg)  02/06/24 148 lb (67.1 kg)  01/13/24 162 lb (73.5 kg)    GEN: Well nourished, well developed in no acute distress NECK: No JVD; No carotid bruits CARDIAC: RRR, no murmurs, rubs, gallops RESPIRATORY:  Clear to auscultation without rales, wheezing or rhonchi  ABDOMEN: Soft, non-tender, non-distended EXTREMITIES:  1-2+ edema; No deformity   ASSESSMENT AND PLAN  LLE Chronic systolic heart failure Echo showed LVEF 20-25% in the setting of Afib and multiple other acute issues. Echo in 2020 LVEF 40-45%. Patient's lasix  increased at the last visit to 40mg  daily, but she still has lower leg edema. BMET today. May have venous insufficiency. I will increase lasix  to 40mg  x3 days, then back down to 40mg  daily. Recommend compression stockings, leg elevation and low salt. Follow-up in 1 month. Plan to re-check echo, and if this is still low, will consider ischemic evaluation with Cardiac CTA.   Paroxysmal Afib Patient remains in NSR. Continue Eliquis  for stroke ppx. Qtc . Continue amiodarone  100mg  daily.   HLD LDL 62. Continue simvastatin  10mg  daily.        Dispo: Follow-up in 1 month  Signed, Carrigan Delafuente Rebekah Canada, PA-C

## 2024-03-05 ENCOUNTER — Ambulatory Visit: Payer: Self-pay | Admitting: Medical

## 2024-03-06 LAB — BASIC METABOLIC PANEL WITH GFR
BUN/Creatinine Ratio: 13 (ref 12–28)
BUN: 15 mg/dL (ref 8–27)
CO2: 24 mmol/L (ref 20–29)
Calcium: 9.9 mg/dL (ref 8.7–10.3)
Chloride: 96 mmol/L (ref 96–106)
Creatinine, Ser: 1.16 mg/dL — ABNORMAL HIGH (ref 0.57–1.00)
Glucose: 115 mg/dL — ABNORMAL HIGH (ref 70–99)
Potassium: 4.3 mmol/L (ref 3.5–5.2)
Sodium: 136 mmol/L (ref 134–144)
eGFR: 46 mL/min/{1.73_m2} — ABNORMAL LOW (ref >59)

## 2024-03-08 ENCOUNTER — Telehealth: Payer: Self-pay

## 2024-03-08 DIAGNOSIS — R072 Precordial pain: Secondary | ICD-10-CM

## 2024-03-08 NOTE — Telephone Encounter (Signed)
 The following verbal orders received from Cadence Gennaro Khat PA-C Order Cardiac CTA Order Metoprolol  tartrate 100 mg to take in addition to pt's Metoprolol  Succinate 100 mg 2 hours prior to test.   Attempted to contact pt Left message to call back   Your cardiac CT will be scheduled at one of the below locations:    [facility] [address] [place], [place] [address] [phone]  OR   [facility] [address] [place], [place] [address] [phone]  If scheduled at [facility] or [facility], please arrive 15 mins early for check-in and test prep.    Please follow these instructions carefully (unless otherwise directed):  An IV will be required for this test and Nitroglycerin  will be given.   On the Night Before the Test: Be sure to Drink plenty of water . Do not consume any caffeinated/decaffeinated beverages or chocolate 12 hours prior to your test. Do not take any antihistamines 12 hours prior to your test.   On the Day of the Test: Drink plenty of water  until 1 hour prior to the test. Do not eat any food 1 hour prior to test. You may take your regular medications prior to the test.  Take metoprolol  (Lopressor ) 100 mg in addition to Metoprolol  Succinate 100 mg two hours prior to test. If you take Furosemide /Hydrochlorothiazide/Spironolactone/Chlorthalidone, please HOLD on the morning of the test. Patients who wear a continuous glucose monitor MUST remove the device prior to scanning. FEMALES- please wear underwire-free bra if available, avoid dresses & tight clothing  After the Test: Drink plenty of water . After receiving IV contrast, you may experience a mild flushed feeling. This is normal. On occasion, you may experience a mild rash up to 24 hours after the test. This is not dangerous. If this occurs, you can take Benadryl 25 mg, Zyrtec, Claritin,  or Allegra and increase your fluid intake. (Patients taking Tikosyn should avoid Benadryl, and may take Zyrtec, Claritin, or Allegra) If you experience trouble breathing, this can be serious. If it is severe call 911 IMMEDIATELY. If it is mild, please call our office.  We will call to schedule your test 2-4 weeks out understanding that some insurance companies will need an authorization prior to the service being performed.   For more information and frequently asked questions, please visit our website : [email or web address]  For non-scheduling related questions, please contact the cardiac imaging nurse navigator should you have any questions/concerns: Cardiac Imaging Nurse Navigators Direct Office Dial: [phone]   For scheduling needs, including cancellations and rescheduling, please call [place], [phone].

## 2024-03-10 MED ORDER — METOPROLOL TARTRATE 100 MG PO TABS: ORAL_TABLET | ORAL | 0 refills | Status: DC

## 2024-03-10 NOTE — Telephone Encounter (Signed)
 Pt's daughter made aware. Order placed and instructions provided.

## 2024-03-10 NOTE — Telephone Encounter (Signed)
 Daughter Sheryle Donning) returned RN's call regarding results.

## 2024-03-10 NOTE — Telephone Encounter (Signed)
 Left a message for the patient to call back.

## 2024-04-01 ENCOUNTER — Telehealth (HOSPITAL_COMMUNITY): Payer: Self-pay | Admitting: *Deleted

## 2024-04-01 NOTE — Telephone Encounter (Signed)
 Attempted to call patient regarding upcoming cardiac CT appointment. Left message on voicemail with name and callback number  Larey Brick RN Navigator Cardiac Imaging [facility] [phone] Office [phone] Cell

## 2024-04-05 ENCOUNTER — Ambulatory Visit
Admission: RE | Admit: 2024-04-05 | Discharge: 2024-04-05 | Disposition: A | Discharge status: Home / Self Care | Source: Ambulatory Visit | Attending: Medical | Admitting: Medical

## 2024-04-05 DIAGNOSIS — R072 Precordial pain: Secondary | ICD-10-CM | POA: Diagnosis present

## 2024-04-05 MED ORDER — IOHEXOL 350 MG/ML SOLN
100.0000 mL | Freq: Once | INTRAVENOUS | Status: AC | PRN
  Administered 2024-04-05: 100 mL via INTRAVENOUS

## 2024-04-05 MED ORDER — METOPROLOL TARTRATE 5 MG/5ML IV SOLN
10.0000 mg | Freq: Once | INTRAVENOUS | Status: AC | PRN
  Administered 2024-04-05: 10 mg via INTRAVENOUS
  Filled 2024-04-05: qty 10

## 2024-04-05 MED ORDER — DILTIAZEM HCL 25 MG/5ML IV SOLN
10.0000 mg | INTRAVENOUS | Status: AC | PRN
  Administered 2024-04-05 (×2): 10 mg via INTRAVENOUS
  Filled 2024-04-05 (×3): qty 5

## 2024-04-05 MED ORDER — NITROGLYCERIN 0.4 MG SL SUBL
0.8000 mg | SUBLINGUAL_TABLET | Freq: Once | SUBLINGUAL | Status: AC
  Administered 2024-04-05: 0.8 mg via SUBLINGUAL
  Filled 2024-04-05: qty 25

## 2024-04-05 MED ORDER — METOPROLOL TARTRATE 5 MG/5ML IV SOLN
INTRAVENOUS | Status: AC
Start: 2024-04-05 — End: 2024-04-05
  Filled 2024-04-05: qty 10

## 2024-04-05 MED ORDER — DILTIAZEM HCL 25 MG/5ML IV SOLN
INTRAVENOUS | Status: AC
  Filled 2024-04-05: qty 5

## 2024-04-05 MED ORDER — NITROGLYCERIN 0.4 MG SL SUBL
SUBLINGUAL_TABLET | SUBLINGUAL | Status: AC
  Filled 2024-04-05: qty 2

## 2024-04-06 ENCOUNTER — Ambulatory Visit: Payer: Self-pay | Admitting: Medical

## 2024-04-08 ENCOUNTER — Telehealth: Payer: Self-pay | Admitting: Cardiovascular Disease

## 2024-04-08 NOTE — Telephone Encounter (Signed)
 Pt c/o medication issue:  1. Name of Medication:  Tramadol  Citalopram   2. How are you currently taking this medication (dosage and times per day)?   3. Are you having a reaction (difficulty breathing--STAT)?   4. What is your medication issue?   Daughter says that stopping these medications has affected patient's mood for the past several weeks. Daughter says patient is at an assisted living facility and she has been calling her crying every day a and asking her to pick her up. She says she was also on the Tramadol  for her hip and now she can hardly walk. She says she spoke with the doctors at the facility and they won't order an antidepressant because it has to be cleared by cardiology. Please advise.

## 2024-04-09 NOTE — Telephone Encounter (Signed)
 Concern would be between amio and citalopram  and risk of QT prolongation. However her discharge paperwork states she was to continue her citalopram  so not sure why it was discontinued.

## 2024-04-09 NOTE — Telephone Encounter (Signed)
 Spoke with patients daughter and she is very desperate to get help for her mother. Since stopping her citalopram  and tramadol  she has been in pain, depressed and overall She is so pitiful and she said What about her quality of life.   She reports these changes started when her citalopram  and tramadol  were discontinued due to QT prolongation. Now the doctor at the facility and PCP will not prescribe anything with out clearance. Daughter just had knee replacement surgery so she is unable to attend appointment with patient. She would really like to talk with provider when her mother comes in for her visit on Monday.   Advised that I will let provider know her request. She was appreciative for the call back with no further questions for now.

## 2024-04-12 ENCOUNTER — Ambulatory Visit: Attending: Medical | Admitting: Medical

## 2024-04-12 ENCOUNTER — Telehealth: Payer: Self-pay | Admitting: Cardiology

## 2024-04-12 ENCOUNTER — Encounter: Payer: Self-pay | Admitting: Medical

## 2024-04-12 VITALS — BP 162/85 | HR 95 | Ht 62.0 in | Wt 160.0 lb

## 2024-04-12 DIAGNOSIS — I10 Essential (primary) hypertension: Secondary | ICD-10-CM | POA: Diagnosis not present

## 2024-04-12 DIAGNOSIS — I480 Paroxysmal atrial fibrillation: Secondary | ICD-10-CM

## 2024-04-12 DIAGNOSIS — I5023 Acute on chronic systolic (congestive) heart failure: Secondary | ICD-10-CM | POA: Diagnosis not present

## 2024-04-12 MED ORDER — FUROSEMIDE 40 MG PO TABS: 60.0000 mg | ORAL_TABLET | Freq: Every day | ORAL | 3 refills | Status: AC

## 2024-04-12 MED ORDER — SACUBITRIL-VALSARTAN 24-26 MG PO TABS: 1.0000 | ORAL_TABLET | Freq: Two times a day (BID) | ORAL | 3 refills | Status: AC

## 2024-04-12 NOTE — Telephone Encounter (Signed)
 Spoke with patients daughter and she is very desperate to get help for her mother. Since stopping her citalopram  and tramadol  she has been in pain, depressed and overall She is so pitiful and she said What about her quality of life.    She reports these changes started when her citalopram  and tramadol  were discontinued due to QT prolongation. Now the doctor at the facility and PCP will not prescribe anything with out clearance.

## 2024-04-12 NOTE — Progress Notes (Signed)
 Cardiology Office Note   Date:  04/12/2024  ID:  Laura Bell, DOB 01-Oct-1935, MRN [id number] PCP: Randeen Laine LABOR, MD  [facility] Providers Cardiologist:  Evalene Lunger, MD Electrophysiologist:  Fonda Kitty, MD   History of Present Illness Laura Bell is a 88 y.o. female with a h/o HTN, HLD, GERD, DM2, recurrent falls, remote tobacco abuse, breast/cervical cancer, cardiomyopathy with an EF of 20-25%, and paroxysmal A-fib who is being seen for follow-up for CHF and Afib.   The patient was previously evaluated in 2020 for mechanical fall and tachycardia.  She had not taken her beta-blocker that morning.  Troponin was elevated to 129.  Echo showed EF of 40 to 45% with diffuse hypokinesis.  Recommendations were for outpatient stress testing.  She was seen in follow-up for stress test was never performed.   The patient presented to the ER 12/23/2023 for shortness of breath.  Patient was found to have influenza A, RSV, new onset A-fib RVR, AKI, thrombophlebitis at previous IV site, acute CHF.  Initial rhythm was sinus tachycardia with heart rates up to 120.  On 3/27 the patient went into A-fib RVR started on IV Dilt drip which was stopped due to hypotension.  Patient was started on amiodarone  and digoxin  with conversion to normal sinus rhythm.  Patient intermittently required midodrine .  Echo showed EF of 20 to 25%.  She was treated with IV Lasix . She was not discharged on a diuretic. She was discharged on Eliquis , Losartan  and Toprol .   The patient was seen for 01/07/24 and was overall doing well at rehab.  QTc was 584 ms and amiodarone  was decreased and she was referred to EP. EP recommended to continue amiodarone  100mg  daily and to stop citalopram  ,tramadol  and azithromycin .    Patient was seen on 02/06/24 was overall doing well at rehab.  EKG showed QTc 469 MS.  Lasix  increased to 40 mg daily. She was seen back in following up 03/04/24 with lower leg edema and lasix  was increased  for 3 days. Echo showed persistently low EF, 20-25%, G2DD. Cardiac CTA showed mild nonobstructive CAD.   Today, the patient reports her nerves are a wreck since being off Tramadol  and antidepressant. She denies chest pain or SOB. She has swelling on her feet. She says she elevates her legs daily but still has swelling. She is in NSR and Qtc . She normally uses a walker.    Studies Reviewed EKG Interpretation Date/Time:  Monday April 12 2024 09:41:43 EDT Ventricular Rate:  95 PR Interval:  154 QRS Duration:  72 QT Interval:  364 QTC Calculation: 457 R Axis:   14  Text Interpretation: Normal sinus rhythm Normal ECG When compared with ECG of 04-Mar-2024 11:52, No significant change was found Confirmed by Franchester, Lamberto Dinapoli ([id number]) on 04/12/2024 9:55:54 AM    Cardiac CTA 03/2024 IMPRESSION: 1. Coronary calcium score of 445.   2. Normal coronary origin with right dominance.   3. Mild RCA stenosis (25-49%).   4. Minimal LAD, LCx stenosis (<25%).   5. CAD-RADS 2. Mild non-obstructive CAD (25-49%). Consider non-atherosclerotic causes of chest pain. Consider preventive therapy and risk factor modification.    Echo 11/2023   1. Left ventricular ejection fraction, by estimation, is 20 to 25%. Left  ventricular ejection fraction by PLAX is 24 %. The left ventricle has  severely decreased function. The left ventricle demonstrates global  hypokinesis. Left ventricular diastolic  parameters are consistent with Grade II diastolic dysfunction  (pseudonormalization).  2. Right ventricular systolic function is normal. The right ventricular  size is normal. There is normal pulmonary artery systolic pressure.   3. The mitral valve is normal in structure. Mild mitral valve  regurgitation.   4. The aortic valve is tricuspid. Aortic valve regurgitation is mild.   5. The inferior vena cava is normal in size with greater than 50%  respiratory variability, suggesting right atrial pressure of 3 mmHg.     Echo 06/2019  1. The left ventricle has mild-moderately reduced systolic function, with  an ejection fraction of 40-45%. The cavity size was normal. Left  ventricular diastolic Doppler parameters are consistent with impaired  relaxation. Left ventricular diffuse  hypokinesis.   2. The right ventricle has normal systolic function. The cavity was  normal. There is no increase in right ventricular wall thickness.Unable to  estimate RVSP         Physical Exam VS:  BP (!) 162/85 (BP Location: Left Arm, Patient Position: Sitting, Cuff Size: Normal)   Pulse 95   Ht 5' 2 (1.575 m)   Wt 160 lb (72.6 kg)   SpO2 98%   BMI 29.26 kg/m        Wt Readings from Last 3 Encounters:  04/12/24 160 lb (72.6 kg)  03/04/24 157 lb 3 oz (71.3 kg)  02/06/24 148 lb (67.1 kg)    GEN: Well nourished, well developed in no acute distress NECK: No JVD; No carotid bruits CARDIAC: RRR, no murmurs, rubs, gallops RESPIRATORY:  Clear to auscultation without rales, wheezing or rhonchi  ABDOMEN: Soft, non-tender, non-distended EXTREMITIES:  No edema; No deformity   ASSESSMENT AND PLAN  Acute on chronic systolic heart failure In 2020 EF was low 40-45% in the setting of tachycardia. Recent Echo showed LVEF 20-25%. Cardiac CTA showed nonobstructive CAD. She continues to have lower leg swelling despite increasing lasix . I will increase lasix  to 60mg  daily, BMET in 2 weeks. I will stop Losartan  and start Entresto  24-26mg BID. I will refer to ACHF team. Continue Farxiga  10mg  daily and toprol  100mg  daily. Can look to add spiro in the future. May need to change to Torsemide. Would re-check echo in 2-3 months and if EF is still low, refer to EP for ICD consideration. Can also consider cMRI.  Paroxysmal Afib The patient remains in NSR. Continue Eliquis  for stroke ppx. Qtc improved 532ms>> . Continue amiodarone  100mg  daily.   HLD LDL 62, continue simvastatin  10mg  daily.        Dispo: Follow-up in 3 months with  general cardiology  Signed, Krishon Adkison VEAR Fishman, PA-C

## 2024-04-12 NOTE — Patient Instructions (Signed)
 Medication Instructions:  Your physician recommends the following medication changes.  STOP TAKING: Losartan   START TAKING: Entresto  24-26 mg by mouth twice a daily  INCREASE: Lasix  to 60 mg by mouth daily   *If you need a refill on your cardiac medications before your next appointment, please call your pharmacy*  Lab Work: Your provider would like for you to return in 2 weeks to have the following labs drawn: BMP.   Please go to [facility] [address] ([facility] [address], [place] [address] You do not need an appointment.  They are open from 8 am- 4:30 pm.  Lunch from 1:00 pm- 2:00 pm You will not need to be fasting.    Testing/Procedures: No test ordered today   Follow-Up: At [facility], you and your health needs are our priority.  As part of our continuing mission to provide you with exceptional heart care, our providers are all part of one team.  This team includes your primary Cardiologist (physician) and Advanced Practice Providers or APPs (Physician Assistants and Nurse Practitioners) who all work together to provide you with the care you need, when you need it.  Your next appointment:   3 month(s)  Provider:   You may see Timothy Gollan, MD or one of the following Advanced Practice Providers on your designated Care Team:   Lonni Meager, NP Lesley Maffucci, PA-C Bernardino Bring, PA-C Cadence [place], PA-C Tylene Lunch, NP Barnie Hila, NP

## 2024-04-12 NOTE — Telephone Encounter (Signed)
 Called to confirm/remind patient of their appointment at the [facility] on 04/13/24.   Appointment:   [x] Confirmed  [] Left mess   [] No answer/No voice mail  [] VM Full/unable to leave message  [] Phone not in service  Patient reminded to bring all medications and/or complete list.  Confirmed patient has transportation. Gave directions, instructed to utilize valet parking.

## 2024-04-13 ENCOUNTER — Encounter: Payer: Self-pay | Admitting: Cardiology

## 2024-04-13 ENCOUNTER — Encounter: Admitting: Cardiology

## 2024-04-13 ENCOUNTER — Ambulatory Visit: Attending: Cardiology | Admitting: Cardiology

## 2024-04-13 VITALS — BP 158/69 | HR 86 | Wt 159.5 lb

## 2024-04-13 DIAGNOSIS — I110 Hypertensive heart disease with heart failure: Secondary | ICD-10-CM | POA: Insufficient documentation

## 2024-04-13 DIAGNOSIS — I480 Paroxysmal atrial fibrillation: Secondary | ICD-10-CM | POA: Diagnosis not present

## 2024-04-13 DIAGNOSIS — Z7984 Long term (current) use of oral hypoglycemic drugs: Secondary | ICD-10-CM | POA: Diagnosis not present

## 2024-04-13 DIAGNOSIS — K219 Gastro-esophageal reflux disease without esophagitis: Secondary | ICD-10-CM | POA: Diagnosis not present

## 2024-04-13 DIAGNOSIS — E119 Type 2 diabetes mellitus without complications: Secondary | ICD-10-CM | POA: Insufficient documentation

## 2024-04-13 DIAGNOSIS — I5022 Chronic systolic (congestive) heart failure: Secondary | ICD-10-CM | POA: Insufficient documentation

## 2024-04-13 DIAGNOSIS — E785 Hyperlipidemia, unspecified: Secondary | ICD-10-CM | POA: Insufficient documentation

## 2024-04-13 DIAGNOSIS — Z79899 Other long term (current) drug therapy: Secondary | ICD-10-CM | POA: Diagnosis not present

## 2024-04-13 DIAGNOSIS — R296 Repeated falls: Secondary | ICD-10-CM | POA: Insufficient documentation

## 2024-04-13 DIAGNOSIS — Z993 Dependence on wheelchair: Secondary | ICD-10-CM | POA: Diagnosis not present

## 2024-04-13 NOTE — Progress Notes (Signed)
   ADVANCED HEART FAILURE CLINIC NOTE  Referring Physician: Franchester Mikey DEL, PA-C  Primary Care: Tower, Laine LABOR, MD Primary Cardiologist: Heart Failure: Ria Commander, DO  CC: HFrEF  HPI: Laura Bell is a 88 y.o. female with HTN, HLD, GERD, T2DM, recurrent falls, paroxysmal atrial fibrillation and HFrEF presenting today to establish care.   Cardiac History:  - 2020 seen for fall; TTE w/ LVEF 40-45%.  - 12/23/23 seen in ER for dyspnea; +influenza A, atrial fibrillation, and acute systolic heart failure s/p amiodarone /digoxin  with conversation to NSR. LVEF 20-25%.    Interval hx:  - Lasix  increased to 60mg  daily yesterday at cardiology clinic; reviewed notes. Started on entresto  24/26mg  BID.  - Currently resides in a skilled nursing facility, wheelchair bound most of the day; reports improvement in dyspnea, orthopnea and edema with the addition of GDMT.  - Her only complaints today are significant anxiety and difficulty sleeping due to discontinuation of tramadol  and remeron; very concerned about this.   PHYSICAL EXAM: Vitals:   04/13/24 1146  BP: (!) 158/69  Pulse: 86  SpO2: 97%   Lungs- CTA CARDIAC:  JVP: 6 cm          Normal rate with regular rhythm. no murmur.  Pulses 2+. no edema.  ABDOMEN: soft EXTREMITIES: Warm and well perfused.   DATA REVIEW  ECG: 04/12/24: NSR as per my personal interpretation  ECHO: 12/26/23: LVEF 20-25%  ASSESSMENT & PLAN:  Heart Failure with reduced ejection fraction - Etiology & History: Acute drop in LVEF on 12/26/23 TTE likely 2/2 atrial fibrillation with RVR; however, cannot r/o underlying CAD. There appear to be WMAs. Will repeat TTE prior to pursuing further work up. She is 76 with significant comorbidities.  - NYHA Class: difficult to assess - Volume status: hypervolemic, 2-3+ pitting edema to the knees; mild crackles. Lasix  increased to 60mg  yesterday. Reports increased urine output today. General cardiology following with  repeat labs scheduled for 2 weeks.  - GDMT:  -  RAASi: Entresto  24/26mg  BID Beta-blocker: toprol  100mg  daily -  SGLT2i: farxiga  10mg  daily - ICD: N/A.  - Advanced therapies: N/A - Will hold off on adding further medications due to concern for polypharmacy.   2. Atrial fibrillation  - amiodarone  100mg  daily - NSR by EKG yesterday; personally reviewed.   Cortina Vultaggio Advanced Heart Failure Mechanical Circulatory Support

## 2024-04-13 NOTE — Patient Instructions (Signed)
 Medication Changes:  No medication changes today!2   Testing/Procedures:  Your physician has requested that you have an echocardiogram. Echocardiography is a painless test that uses sound waves to create images of your heart. It provides your doctor with information about the size and shape of your heart and how well your heart's chambers and valves are working. This procedure takes approximately one hour. There are no restrictions for this procedure. Please do NOT wear cologne, perfume, aftershave, or lotions (deodorant is allowed). Please arrive 15 minutes prior to your appointment time.  Please note: We ask at that you not bring children with you during ultrasound (echo/ vascular) testing. Due to room size and safety concerns, children are not allowed in the ultrasound rooms during exams. Our front office staff cannot provide observation of children in our lobby area while testing is being conducted. An adult accompanying a patient to their appointment will only be allowed in the ultrasound room at the discretion of the ultrasound technician under special circumstances. We apologize for any inconvenience.   Follow-Up in: Please follow up with the [facility] in 2 months with Dr. Gardenia with an echocardiogram.   At the [facility], you and your health needs are our priority. We have a designated team specialized in the treatment of Heart Failure. This Care Team includes your primary Heart Failure Specialized Cardiologist (physician), Advanced Practice Providers (APPs- Physician Assistants and Nurse Practitioners), and Pharmacist who all work together to provide you with the care you need, when you need it.   You may see any of the following providers on your designated Care Team at your next follow up:  Dr. Toribio Fuel Dr. Ezra Shuck Dr. Ria Gardenia Dr. Odis Brownie Ellouise Class, FNP Jaun Bash, RPH-CPP  Please be sure to bring in all  your medications bottles to every appointment.   Need to Contact Us :  If you have any questions or concerns before your next appointment please send us  a message through [place] or call our office at [phone].    TO LEAVE A MESSAGE FOR THE NURSE SELECT OPTION 2, PLEASE LEAVE A MESSAGE INCLUDING: YOUR NAME DATE OF BIRTH CALL BACK NUMBER REASON FOR CALL**this is important as we prioritize the call backs  YOU WILL RECEIVE A CALL BACK THE SAME DAY AS LONG AS YOU CALL BEFORE 4:00 PM

## 2024-04-20 NOTE — Telephone Encounter (Signed)
 Spoke with patients daughter and reviewed Dr. Carroll recommendations. She verbalized understanding with no further questions.     Kennyth Chew, MD     04/16/24  8:43 PM There are both pain medications and SSRIs that do not have as much effect on the Qtc. Her PCP should be more than capable of finding an alternative with the assistance of a pharmacist, if needed. No specific need for EP clearance. The bulk of her Qtc prolongation was likely from azithromycin , in addition to already being on multiple QT prolonging drugs. PCP just needs to be more mindful when prescribing medications.    Josh

## 2024-04-26 ENCOUNTER — Telehealth: Payer: Self-pay | Admitting: Cardiovascular Disease

## 2024-04-26 NOTE — Telephone Encounter (Signed)
 Spoke to [place] at [facility].  Will draw pt's BMP on 7/29 and will fax results to Cadence Furth, PA-C.  Thanked her for her call and gave her our Fax number.

## 2024-04-26 NOTE — Telephone Encounter (Signed)
 [facility] in [place] called to inform us  that the patient is going to have labs drawn tomorrow at their facility. [facility] stated they will fax the results to us  once they are available.

## 2024-05-04 ENCOUNTER — Encounter: Payer: Self-pay | Admitting: Medical

## 2024-06-10 ENCOUNTER — Telehealth: Payer: Self-pay | Admitting: Cardiology

## 2024-06-10 NOTE — Telephone Encounter (Signed)
 Called to confirm/remind patient of their appointment at the [facility] on 06/11/24.   Appointment:   [] Confirmed  [x] Left mess   [] No answer/No voice mail  [] VM Full/unable to leave message  [] Phone not in service  Patient reminded to bring all medications and/or complete list.  Confirmed patient has transportation. Gave directions, instructed to utilize valet parking.

## 2024-06-11 ENCOUNTER — Encounter: Payer: Self-pay | Admitting: Cardiology

## 2024-06-11 ENCOUNTER — Ambulatory Visit
Admission: RE | Admit: 2024-06-11 | Discharge: 2024-06-11 | Disposition: A | Discharge status: Home / Self Care | Source: Ambulatory Visit | Attending: Cardiology | Admitting: Cardiology

## 2024-06-11 ENCOUNTER — Other Ambulatory Visit
Admission: RE | Admit: 2024-06-11 | Discharge: 2024-06-11 | Disposition: A | Discharge status: Home / Self Care | Source: Ambulatory Visit | Attending: Cardiology | Admitting: Cardiology

## 2024-06-11 ENCOUNTER — Ambulatory Visit: Admitting: Cardiology

## 2024-06-11 VITALS — BP 145/64 | HR 89 | Wt 156.2 lb

## 2024-06-11 DIAGNOSIS — Z79899 Other long term (current) drug therapy: Secondary | ICD-10-CM | POA: Diagnosis not present

## 2024-06-11 DIAGNOSIS — I5022 Chronic systolic (congestive) heart failure: Secondary | ICD-10-CM | POA: Diagnosis present

## 2024-06-11 DIAGNOSIS — R296 Repeated falls: Secondary | ICD-10-CM | POA: Insufficient documentation

## 2024-06-11 DIAGNOSIS — Z5181 Encounter for therapeutic drug level monitoring: Secondary | ICD-10-CM | POA: Diagnosis not present

## 2024-06-11 DIAGNOSIS — I480 Paroxysmal atrial fibrillation: Secondary | ICD-10-CM

## 2024-06-11 DIAGNOSIS — I110 Hypertensive heart disease with heart failure: Secondary | ICD-10-CM | POA: Insufficient documentation

## 2024-06-11 DIAGNOSIS — E785 Hyperlipidemia, unspecified: Secondary | ICD-10-CM | POA: Insufficient documentation

## 2024-06-11 DIAGNOSIS — R5382 Chronic fatigue, unspecified: Secondary | ICD-10-CM | POA: Diagnosis not present

## 2024-06-11 DIAGNOSIS — E119 Type 2 diabetes mellitus without complications: Secondary | ICD-10-CM | POA: Diagnosis not present

## 2024-06-11 LAB — COMPREHENSIVE METABOLIC PANEL WITH GFR
ALT: 10 U/L (ref 0–44)
AST: 21 U/L (ref 15–41)
Albumin: 3.9 g/dL (ref 3.5–5.0)
Alkaline Phosphatase: 73 U/L (ref 38–126)
Anion gap: 15 (ref 5–15)
BUN: 23 mg/dL (ref 8–23)
CO2: 24 mmol/L (ref 22–32)
Calcium: 9.6 mg/dL (ref 8.9–10.3)
Chloride: 99 mmol/L (ref 98–111)
Creatinine, Ser: 1.12 mg/dL — ABNORMAL HIGH (ref 0.44–1.00)
GFR, Estimated: 48 mL/min — ABNORMAL LOW (ref >60)
Glucose, Bld: 105 mg/dL — ABNORMAL HIGH (ref 70–99)
Potassium: 3.4 mmol/L — ABNORMAL LOW (ref 3.5–5.1)
Sodium: 138 mmol/L (ref 135–145)
Total Bilirubin: 0.5 mg/dL (ref 0.0–1.2)
Total Protein: 6.9 g/dL (ref 6.5–8.1)

## 2024-06-11 LAB — T4, FREE: Free T4: 1.31 ng/dL — ABNORMAL HIGH (ref 0.61–1.12)

## 2024-06-11 LAB — TSH: TSH: 3.255 u[IU]/mL (ref 0.350–4.500)

## 2024-06-11 LAB — ECHOCARDIOGRAM COMPLETE
AR max vel: 1.57 cm2
AV Area VTI: 1.59 cm2
AV Area mean vel: 1.53 cm2
AV Mean grad: 5 mmHg
AV Peak grad: 9.6 mmHg
Ao pk vel: 1.55 m/s
Area-P 1/2: 4.15 cm2
Calc EF: 41.9 %
MV VTI: 2.48 cm2
S' Lateral: 3.7 cm
Single Plane A2C EF: 45.8 %
Single Plane A4C EF: 37.7 %

## 2024-06-11 LAB — BRAIN NATRIURETIC PEPTIDE: B Natriuretic Peptide: 84.7 pg/mL (ref 0.0–100.0)

## 2024-06-11 NOTE — Progress Notes (Signed)
   ADVANCED HEART FAILURE CLINIC NOTE  Referring Physician: Tower, Laine LABOR, MD  Primary Care: Tower, Laine LABOR, MD Primary Cardiologist: Heart Failure: Ria Commander, DO  CC: HFrEF  HPI: Laura Bell is a 88 y.o. female with HTN, HLD, GERD, T2DM, recurrent falls, paroxysmal atrial fibrillation and HFrEF presenting today to establish care.   Cardiac History:  - 2020 seen for fall; TTE w/ LVEF 40-45%.  - 12/23/23 seen in ER for dyspnea; +influenza A, atrial fibrillation, and acute systolic heart failure s/p amiodarone /digoxin  with conversation to NSR. LVEF 20-25%.  - 9/25: repeat TTE with recovery of LV function.    Interval hx:  - Currently resides in a skilled nursing facility.  - She has restarted physical therapy at at [facility]. Reports that is doing very well from a functional standpoint. She has no chest pain, no dyspnea, lightheadedness.  - Started GDMT with diuretic adjustments at our last appointment. Repeat TTE today with recovery of LV function.   PHYSICAL EXAM: Vitals:   06/11/24 1051  BP: (!) 145/64  Pulse: 89  SpO2: 99%   GENERAL: NAD Lungs- CTA CARDIAC:  JVP: 7 cm          Normal rate with regular rhythm. no murmur.  Pulses 2+. 1+ edema.  ABDOMEN: Soft, non-tender, non-distended.  EXTREMITIES: Warm and well perfused.  NEUROLOGIC: No obvious FND   DATA REVIEW  ECG: 04/12/24: NSR as per my personal interpretation  ECHO: 12/26/23: LVEF 20-25% 06/11/24: LVEF 55%  ASSESSMENT & PLAN:  Heart Failure with reduced ejection fraction - Etiology & History: Acute drop in LVEF on 12/26/23 TTE likely 2/2 atrial fibrillation with RVR; however, cannot r/o underlying CAD. TTE today with improvement in LV function. Continue medical management.  - NYHA Class: difficult to assess - Volume status: Lasix  60mg  daily; she has 1+ pitting edema, however, IVC on TTE small and collapsible. Will avoid over diuresis. She attempted compression stocking but needed to stop due to  mild skin breakdown.  - GDMT:  -  RAASi: Entresto  24/26mg  BID Beta-blocker: toprol  100mg  daily -  SGLT2i: farxiga  10mg  daily - ICD: N/A.  - Advanced therapies: N/A - Will hold off on adding further medications due to concern for polypharmacy.  - Repeat BMP/BNP today   2. Atrial fibrillation  - amiodarone  100mg  daily - Regular pulse on exam today.  - TSH/T4/LFTs today for amiodarone .  - Can likely wean off at cardiology follow up visit.   Laura Bell Advanced Heart Failure Mechanical Circulatory Support

## 2024-06-11 NOTE — Patient Instructions (Signed)
 Medication Changes:  No medication changes today!  Lab Work:  Go over to the [facility]. Go pass the gift shop and have your blood work completed.  We will only call you if the results are abnormal or if the provider would like to make medication changes.  No news is good news.  Follow-Up in: Please follow up with the [facility] in 3 months with Ellouise Class, FNP.   Thank you for choosing [facility].    At the [facility], you and your health needs are our priority. We have a designated team specialized in the treatment of Heart Failure. This Care Team includes your primary Heart Failure Specialized Cardiologist (physician), Advanced Practice Providers (APPs- Physician Assistants and Nurse Practitioners), and Pharmacist who all work together to provide you with the care you need, when you need it.   You may see any of the following providers on your designated Care Team at your next follow up:  Dr. Toribio Fuel Dr. Ezra Shuck Dr. Ria Commander Dr. Morene Brownie Ellouise Class, FNP Jaun Bash, RPH-CPP  Please be sure to bring in all your medications bottles to every appointment.   Need to Contact Us :  If you have any questions or concerns before your next appointment please send us  a message through [place] or call our office at [phone].    TO LEAVE A MESSAGE FOR THE NURSE SELECT OPTION 2, PLEASE LEAVE A MESSAGE INCLUDING: YOUR NAME DATE OF BIRTH CALL BACK NUMBER REASON FOR CALL**this is important as we prioritize the call backs  YOU WILL RECEIVE A CALL BACK THE SAME DAY AS LONG AS YOU CALL BEFORE 4:00 PM

## 2024-06-14 LAB — T3, FREE: T3, Free: 2.4 pg/mL (ref 2.0–4.4)

## 2024-07-14 ENCOUNTER — Encounter: Payer: Self-pay | Admitting: Medical

## 2024-07-14 ENCOUNTER — Ambulatory Visit: Attending: Medical | Admitting: Medical

## 2024-07-14 VITALS — BP 130/60 | HR 88 | Ht 62.0 in | Wt 159.0 lb

## 2024-07-14 DIAGNOSIS — I5020 Unspecified systolic (congestive) heart failure: Secondary | ICD-10-CM

## 2024-07-14 DIAGNOSIS — E782 Mixed hyperlipidemia: Secondary | ICD-10-CM

## 2024-07-14 DIAGNOSIS — I2510 Atherosclerotic heart disease of native coronary artery without angina pectoris: Secondary | ICD-10-CM

## 2024-07-14 DIAGNOSIS — I480 Paroxysmal atrial fibrillation: Secondary | ICD-10-CM

## 2024-07-14 NOTE — Progress Notes (Signed)
 Cardiology Office Note   Date:  07/14/2024  ID:  Laura Bell, DOB March 03, 1936, MRN [id number] PCP: Randeen Laine LABOR, MD  [facility] Providers Cardiologist:  Evalene Lunger, MD Electrophysiologist:  Fonda Kitty, MD   History of Present Illness Laura Bell is a 88 y.o. female with a h/o HTN, HLD, GERD, DM2, recurrent falls, remote tobacco abuse, breast/cervical cancer, cardiomyopathy with an EF of 20-25%, and paroxysmal A-fib who is being seen for follow-up for CHF and Afib.   The patient was previously evaluated in 2020 for mechanical fall and tachycardia.  She had not taken her beta-blocker that morning.  Troponin was elevated to 129.  Echo showed EF of 40 to 45% with diffuse hypokinesis.  Recommendations were for outpatient stress testing.  She was seen in follow-up for stress test was never performed.   The patient presented to the ER 12/23/2023 for shortness of breath.  Patient was found to have influenza A, RSV, new onset A-fib RVR, AKI, thrombophlebitis at previous IV site, acute CHF.  Initial rhythm was sinus tachycardia with heart rates up to 120.  On 3/27 the patient went into A-fib RVR started on IV Dilt drip which was stopped due to hypotension.  Patient was started on amiodarone  and digoxin  with conversion to normal sinus rhythm.  Patient intermittently required midodrine .  Echo showed EF of 20 to 25%.  She was treated with IV Lasix . She was not discharged on a diuretic. She was discharged on Eliquis , Losartan  and Toprol .   The patient was seen for 01/07/24 and was overall doing well at rehab.  QTc was 584 ms and amiodarone  was decreased and she was referred to EP. EP recommended to continue amiodarone  100mg  daily and to stop citalopram  ,tramadol  and azithromycin .    Patient was seen on 02/06/24 was overall doing well at rehab.  EKG showed QTc 469 MS.  Lasix  increased to 40 mg daily. She was seen back in following up 03/04/24 with lower leg edema and lasix  was increased  for 3 days. Echo showed persistently low EF, 20-25%, G2DD. Sh was referred to AHF. Cardiac CTA showed mild nonobstructive CAD. Repeat TTE 05/2024 showed recovered LVEF.   The patient was last seen 06/11/24 by heart failure team. She had stable lower leg edema and no changes were made  Today, the patient reports lower leg edema for about a month. She is unsure if this is worse than a month ago. Weight has increased by 3 lbs. She denies SOB and chest pain. She does not elevated her feet or wear compression socks.    Studies Reviewed EKG Interpretation Date/Time:  Wednesday July 14 2024 10:30:01 EDT Ventricular Rate:  88 PR Interval:  128 QRS Duration:  72 QT Interval:  388 QTC Calculation: 469 R Axis:   14  Text Interpretation: Normal sinus rhythm Normal ECG When compared with ECG of 12-Apr-2024 09:41, No significant change was found Confirmed by Franchester, Genae Strine ([id number]) on 07/14/2024 10:40:02 AM       Echo 05/2024 1. Left ventricular ejection fraction, by estimation, is 60 to 65%. The  left ventricle has normal function. The left ventricle has no regional  wall motion abnormalities. Left ventricular diastolic parameters are  indeterminate.   2. Right ventricular systolic function is normal. The right ventricular  size is normal.   3. The mitral valve is normal in structure. Trivial mitral valve  regurgitation. No evidence of mitral stenosis.   4. The aortic valve is normal in structure. Aortic  valve regurgitation is  not visualized. No aortic stenosis is present.   5. The inferior vena cava is normal in size with greater than 50%  respiratory variability, suggesting right atrial pressure of 3 mmHg.   Cardiac CTA 03/2024 IMPRESSION: 1. Coronary calcium score of 445.   2. Normal coronary origin with right dominance.   3. Mild RCA stenosis (25-49%).   4. Minimal LAD, LCx stenosis (<25%).   5. CAD-RADS 2. Mild non-obstructive CAD (25-49%). Consider non-atherosclerotic causes of  chest pain. Consider preventive therapy and risk factor modification.    Echo 11/2023   1. Left ventricular ejection fraction, by estimation, is 20 to 25%. Left  ventricular ejection fraction by PLAX is 24 %. The left ventricle has  severely decreased function. The left ventricle demonstrates global  hypokinesis. Left ventricular diastolic  parameters are consistent with Grade II diastolic dysfunction  (pseudonormalization).   2. Right ventricular systolic function is normal. The right ventricular  size is normal. There is normal pulmonary artery systolic pressure.   3. The mitral valve is normal in structure. Mild mitral valve  regurgitation.   4. The aortic valve is tricuspid. Aortic valve regurgitation is mild.   5. The inferior vena cava is normal in size with greater than 50%  respiratory variability, suggesting right atrial pressure of 3 mmHg.    Echo 06/2019  1. The left ventricle has mild-moderately reduced systolic function, with  an ejection fraction of 40-45%. The cavity size was normal. Left  ventricular diastolic Doppler parameters are consistent with impaired  relaxation. Left ventricular diffuse  hypokinesis.   2. The right ventricle has normal systolic function. The cavity was  normal. There is no increase in right ventricular wall thickness.Unable to  estimate RVSP      Physical Exam VS:  BP 130/60 (BP Location: Left Arm, Patient Position: Sitting, Cuff Size: Normal)   Pulse 88   Ht 5' 2 (1.575 m)   Wt 159 lb (72.1 kg)   SpO2 100%   BMI 29.08 kg/m        Wt Readings from Last 3 Encounters:  07/14/24 159 lb (72.1 kg)  06/11/24 156 lb 3.2 oz (70.9 kg)  04/13/24 159 lb 8 oz (72.3 kg)    GEN: Well nourished, well developed in no acute distress NECK: No JVD; No carotid bruits CARDIAC: RRR, no murmurs, rubs, gallops RESPIRATORY:  Clear to auscultation without rales, wheezing or rhonchi  ABDOMEN: Soft, non-tender, non-distended EXTREMITIES:  1-2+ lower leg  edema; No deformity   ASSESSMENT AND PLAN  Chronic HFimpEF  NICM H/o CM likely due to Afib RVR. Cardiac CTA showed mild nonobstructive CAD. repeat Echo 05/2024 showed LVEF 60-65%. Today on exam she has 1-2 + lower leg edema on exam.  At her visit with heart failure 1 month ago she was noted to have 1+ edema, not wanting to over-diurese.  Patient feels swelling is worse and she is up 3 pounds.  She has been taking Lasix  60 mg daily.  I will increase lasix  to 60 mg twice daily x 3 days, then back down to Lasix  60 mg daily thereafter.  Patient can take extra Lasix  if weight goes up 3 pounds overnight or 5 pounds in a week.  Recommend low-salt diet, compression socks, feet elevation.  Continue Entresto , Farxiga , Toprol .  Continue follow-up with the advanced heart failure team.  Paroxysmal A-fib EKG shows normal sinus rhythm with a heart rate of 88 bpm.  Continue Eliquis  5 mg twice daily for  stroke prophylaxis.  Continue amiodarone  100 mg daily.  Continue Toprol  100 mg daily.  Nonobstructive CAD Patient denies chest pain or SOB. No ASA given Eliquis . Continue BB and statin therapy.   HLD LDL 62.  Continue simvastatin  10 mg daily.       Dispo: Follow-up with general cards in 6 months  Signed, Lafawn Lenoir VEAR Fishman, PA-C

## 2024-07-14 NOTE — Patient Instructions (Signed)
 Medication Instructions: Your physician recommends the following medication changes.  INCREASE: Lasix  to 60 mg twice a day for 3 days, then return to 60 mg daily thereafter   *If you need a refill on your cardiac medications before your next appointment, please call your pharmacy*  Lab Work: No labs ordered today    Testing/Procedures: No test ordered today   Follow-Up: At [facility], you and your health needs are our priority.  As part of our continuing mission to provide you with exceptional heart care, our providers are all part of one team.  This team includes your primary Cardiologist (physician) and Advanced Practice Providers or APPs (Physician Assistants and Nurse Practitioners) who all work together to provide you with the care you need, when you need it.  Your next appointment:   6 month(s)  Provider:   Timothy Gollan, MD or Cadence Franchester, PA-C

## 2024-09-08 ENCOUNTER — Telehealth: Payer: Self-pay | Admitting: Family

## 2024-09-08 NOTE — Telephone Encounter (Signed)
 Called to confirm/remind patient of their appointment at the [facility] Clinic on 09/09/24.   Appointment:   [] Confirmed  [x] Left mess   [] No answer/No voice mail  [] VM Full/unable to leave message  [] Phone not in service  Patient reminded to bring all medications and/or complete list.  Confirmed patient has transportation. Gave directions, instructed to utilize valet parking.

## 2024-09-09 ENCOUNTER — Ambulatory Visit: Attending: Family | Admitting: Family

## 2024-09-09 ENCOUNTER — Encounter: Payer: Self-pay | Admitting: Family

## 2024-09-09 VITALS — BP 132/69 | HR 90 | Wt 153.0 lb

## 2024-09-09 DIAGNOSIS — E1122 Type 2 diabetes mellitus with diabetic chronic kidney disease: Secondary | ICD-10-CM | POA: Diagnosis not present

## 2024-09-09 DIAGNOSIS — Z794 Long term (current) use of insulin: Secondary | ICD-10-CM

## 2024-09-09 DIAGNOSIS — I480 Paroxysmal atrial fibrillation: Secondary | ICD-10-CM

## 2024-09-09 DIAGNOSIS — I5020 Unspecified systolic (congestive) heart failure: Secondary | ICD-10-CM

## 2024-09-09 DIAGNOSIS — N1831 Chronic kidney disease, stage 3a: Secondary | ICD-10-CM

## 2024-09-09 DIAGNOSIS — I10 Essential (primary) hypertension: Secondary | ICD-10-CM | POA: Diagnosis not present

## 2024-09-09 LAB — BASIC METABOLIC PANEL WITH GFR
BUN/Creatinine Ratio: 20 (ref 12–28)
BUN: 20 mg/dL (ref 8–27)
CO2: 26 mmol/L (ref 20–29)
Calcium: 9.7 mg/dL (ref 8.7–10.3)
Chloride: 98 mmol/L (ref 96–106)
Creatinine, Ser: 1.01 mg/dL — ABNORMAL HIGH (ref 0.57–1.00)
Glucose: 110 mg/dL — ABNORMAL HIGH (ref 70–99)
Potassium: 3.7 mmol/L (ref 3.5–5.2)
Sodium: 138 mmol/L (ref 134–144)
eGFR: 54 mL/min/1.73 — ABNORMAL LOW (ref >59)

## 2024-09-09 NOTE — Patient Instructions (Signed)
 Medication Changes:  No medication changes today!  Lab Work:  Go downstairs to [facility] on [address] to have your blood work completed.  We will only call you if the results are abnormal or if the provider would like to make medication changes.  No news is good news.   Follow-Up in: Please follow up with the Advanced Heart Failure Clinic in 3 months with Ellouise Class, FNP.   Thank you for choosing [facility].    At the Advanced Heart Failure Clinic, you and your health needs are our priority. We have a designated team specialized in the treatment of Heart Failure. This Care Team includes your primary Heart Failure Specialized Cardiologist (physician), Advanced Practice Providers (APPs- Physician Assistants and Nurse Practitioners), and Pharmacist who all work together to provide you with the care you need, when you need it.   You may see any of the following providers on your designated Care Team at your next follow up:  Dr. Toribio Fuel Dr. Ezra Shuck Dr. Ria Commander Dr. Morene Brownie Ellouise Class, FNP Jaun Bash, RPH-CPP  Please be sure to bring in all your medications bottles to every appointment.   Need to Contact Us :  If you have any questions or concerns before your next appointment please send us  a message through [place] or call our office at [phone].    TO LEAVE A MESSAGE FOR THE NURSE SELECT OPTION 2, PLEASE LEAVE A MESSAGE INCLUDING: YOUR NAME DATE OF BIRTH CALL BACK NUMBER REASON FOR CALL**this is important as we prioritize the call backs  YOU WILL RECEIVE A CALL BACK THE SAME DAY AS LONG AS YOU CALL BEFORE 4:00 PM
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
## 2024-09-10 ENCOUNTER — Ambulatory Visit: Payer: Self-pay | Admitting: Family

## 2024-12-09 ENCOUNTER — Ambulatory Visit: Admitting: Family
